# Patient Record
Sex: Female | Born: 1950 | Race: White | Hispanic: No | Marital: Married | Smoking: Former smoker
Health system: Southern US, Community
[De-identification: ages and names within clinical notes are randomized; demographics above are authoritative.]

## PROBLEM LIST (undated history)

## (undated) DIAGNOSIS — K635 Polyp of colon: Secondary | ICD-10-CM

## (undated) DIAGNOSIS — E669 Obesity, unspecified: Secondary | ICD-10-CM

## (undated) DIAGNOSIS — K219 Gastro-esophageal reflux disease without esophagitis: Secondary | ICD-10-CM

## (undated) DIAGNOSIS — M7989 Other specified soft tissue disorders: Secondary | ICD-10-CM

## (undated) DIAGNOSIS — E7800 Pure hypercholesterolemia, unspecified: Secondary | ICD-10-CM

## (undated) DIAGNOSIS — I10 Essential (primary) hypertension: Secondary | ICD-10-CM

## (undated) DIAGNOSIS — F329 Major depressive disorder, single episode, unspecified: Secondary | ICD-10-CM

## (undated) DIAGNOSIS — J449 Chronic obstructive pulmonary disease, unspecified: Secondary | ICD-10-CM

## (undated) DIAGNOSIS — F32A Depression, unspecified: Secondary | ICD-10-CM

## (undated) DIAGNOSIS — N200 Calculus of kidney: Secondary | ICD-10-CM

## (undated) DIAGNOSIS — R0602 Shortness of breath: Secondary | ICD-10-CM

## (undated) DIAGNOSIS — M2550 Pain in unspecified joint: Secondary | ICD-10-CM

## (undated) HISTORY — DX: Pain in unspecified joint: M25.50

## (undated) HISTORY — DX: Depression, unspecified: F32.A

## (undated) HISTORY — DX: Essential (primary) hypertension: I10

## (undated) HISTORY — DX: Pure hypercholesterolemia, unspecified: E78.00

## (undated) HISTORY — PX: TONSILLECTOMY: SUR1361

## (undated) HISTORY — DX: Calculus of kidney: N20.0

## (undated) HISTORY — DX: Gastro-esophageal reflux disease without esophagitis: K21.9

## (undated) HISTORY — DX: Chronic obstructive pulmonary disease, unspecified: J44.9

## (undated) HISTORY — DX: Other specified soft tissue disorders: M79.89

## (undated) HISTORY — DX: Polyp of colon: K63.5

## (undated) HISTORY — PX: DILATION AND CURETTAGE OF UTERUS: SHX78

## (undated) HISTORY — PX: MOHS SURGERY: SUR867

## (undated) HISTORY — PX: CHOLECYSTECTOMY: SHX55

## (undated) HISTORY — DX: Obesity, unspecified: E66.9

## (undated) HISTORY — DX: Shortness of breath: R06.02

## (undated) HISTORY — DX: Major depressive disorder, single episode, unspecified: F32.9

---

## 1998-02-04 ENCOUNTER — Emergency Department (HOSPITAL_COMMUNITY): Admission: EM | Admit: 1998-02-04 | Discharge: 1998-02-04 | Payer: Self-pay | Admitting: Emergency Medicine

## 1998-02-05 ENCOUNTER — Encounter: Payer: Self-pay | Admitting: Emergency Medicine

## 1998-03-19 ENCOUNTER — Encounter: Payer: Self-pay | Admitting: Internal Medicine

## 1998-03-19 ENCOUNTER — Ambulatory Visit (HOSPITAL_COMMUNITY): Admission: RE | Admit: 1998-03-19 | Discharge: 1998-03-19 | Payer: Self-pay | Admitting: Internal Medicine

## 1999-06-03 ENCOUNTER — Other Ambulatory Visit: Admission: RE | Admit: 1999-06-03 | Discharge: 1999-06-03 | Payer: Self-pay | Admitting: Internal Medicine

## 1999-06-05 ENCOUNTER — Emergency Department (HOSPITAL_COMMUNITY): Admission: EM | Admit: 1999-06-05 | Discharge: 1999-06-05 | Payer: Self-pay | Admitting: Emergency Medicine

## 1999-09-11 ENCOUNTER — Emergency Department (HOSPITAL_COMMUNITY): Admission: EM | Admit: 1999-09-11 | Discharge: 1999-09-11 | Payer: Self-pay | Admitting: Emergency Medicine

## 1999-10-28 ENCOUNTER — Ambulatory Visit (HOSPITAL_COMMUNITY): Admission: RE | Admit: 1999-10-28 | Discharge: 1999-10-28 | Payer: Self-pay | Admitting: Internal Medicine

## 2000-08-27 ENCOUNTER — Other Ambulatory Visit: Admission: RE | Admit: 2000-08-27 | Discharge: 2000-08-27 | Payer: Self-pay | Admitting: Internal Medicine

## 2001-11-01 ENCOUNTER — Other Ambulatory Visit: Admission: RE | Admit: 2001-11-01 | Discharge: 2001-11-01 | Payer: Self-pay | Admitting: General Surgery

## 2004-02-23 ENCOUNTER — Ambulatory Visit: Payer: Self-pay | Admitting: Internal Medicine

## 2004-02-28 ENCOUNTER — Ambulatory Visit: Payer: Self-pay | Admitting: Internal Medicine

## 2004-02-28 ENCOUNTER — Other Ambulatory Visit: Admission: RE | Admit: 2004-02-28 | Discharge: 2004-02-28 | Payer: Self-pay | Admitting: Internal Medicine

## 2004-03-24 HISTORY — PX: COLONOSCOPY: SHX174

## 2004-06-25 ENCOUNTER — Ambulatory Visit: Payer: Self-pay | Admitting: Internal Medicine

## 2004-08-08 ENCOUNTER — Ambulatory Visit: Payer: Self-pay | Admitting: Internal Medicine

## 2004-08-16 ENCOUNTER — Ambulatory Visit: Payer: Self-pay | Admitting: Internal Medicine

## 2004-08-16 ENCOUNTER — Encounter: Payer: Self-pay | Admitting: Internal Medicine

## 2004-08-16 ENCOUNTER — Encounter (INDEPENDENT_AMBULATORY_CARE_PROVIDER_SITE_OTHER): Payer: Self-pay | Admitting: Specialist

## 2004-08-16 DIAGNOSIS — K635 Polyp of colon: Secondary | ICD-10-CM

## 2004-08-16 HISTORY — DX: Polyp of colon: K63.5

## 2005-01-02 ENCOUNTER — Ambulatory Visit: Payer: Self-pay | Admitting: Internal Medicine

## 2005-04-07 ENCOUNTER — Emergency Department (HOSPITAL_COMMUNITY): Admission: EM | Admit: 2005-04-07 | Discharge: 2005-04-07 | Payer: Self-pay | Admitting: Emergency Medicine

## 2005-04-29 ENCOUNTER — Ambulatory Visit: Payer: Self-pay | Admitting: Internal Medicine

## 2005-05-06 ENCOUNTER — Encounter: Payer: Self-pay | Admitting: Internal Medicine

## 2005-05-06 ENCOUNTER — Other Ambulatory Visit: Admission: RE | Admit: 2005-05-06 | Discharge: 2005-05-06 | Payer: Self-pay | Admitting: Internal Medicine

## 2005-05-06 ENCOUNTER — Ambulatory Visit: Payer: Self-pay | Admitting: Internal Medicine

## 2005-08-11 ENCOUNTER — Ambulatory Visit: Payer: Self-pay | Admitting: Internal Medicine

## 2005-12-05 ENCOUNTER — Ambulatory Visit: Payer: Self-pay | Admitting: Cardiology

## 2005-12-05 ENCOUNTER — Ambulatory Visit: Payer: Self-pay | Admitting: Internal Medicine

## 2005-12-12 ENCOUNTER — Ambulatory Visit: Payer: Self-pay | Admitting: Gastroenterology

## 2005-12-18 ENCOUNTER — Ambulatory Visit: Payer: Self-pay | Admitting: Internal Medicine

## 2006-10-20 ENCOUNTER — Encounter: Payer: Self-pay | Admitting: Internal Medicine

## 2006-11-05 ENCOUNTER — Encounter (INDEPENDENT_AMBULATORY_CARE_PROVIDER_SITE_OTHER): Payer: Self-pay

## 2006-11-13 ENCOUNTER — Encounter (INDEPENDENT_AMBULATORY_CARE_PROVIDER_SITE_OTHER): Payer: Self-pay

## 2006-12-21 DIAGNOSIS — K219 Gastro-esophageal reflux disease without esophagitis: Secondary | ICD-10-CM | POA: Insufficient documentation

## 2006-12-21 DIAGNOSIS — F339 Major depressive disorder, recurrent, unspecified: Secondary | ICD-10-CM | POA: Insufficient documentation

## 2006-12-21 DIAGNOSIS — I10 Essential (primary) hypertension: Secondary | ICD-10-CM | POA: Insufficient documentation

## 2006-12-21 DIAGNOSIS — Z87442 Personal history of urinary calculi: Secondary | ICD-10-CM | POA: Insufficient documentation

## 2007-02-23 ENCOUNTER — Telehealth: Payer: Self-pay | Admitting: Internal Medicine

## 2007-06-16 ENCOUNTER — Ambulatory Visit: Payer: Self-pay | Admitting: Internal Medicine

## 2007-06-16 LAB — CONVERTED CEMR LAB
ALT: 16 units/L (ref 0–35)
AST: 18 units/L (ref 0–37)
Albumin: 3.5 g/dL (ref 3.5–5.2)
Alkaline Phosphatase: 93 units/L (ref 39–117)
BUN: 16 mg/dL (ref 6–23)
Basophils Absolute: 0.1 10*3/uL (ref 0.0–0.1)
Basophils Relative: 1.1 % — ABNORMAL HIGH (ref 0.0–1.0)
Bilirubin Urine: NEGATIVE
Bilirubin, Direct: 0.1 mg/dL (ref 0.0–0.3)
CO2: 34 meq/L — ABNORMAL HIGH (ref 19–32)
Calcium: 9.4 mg/dL (ref 8.4–10.5)
Chloride: 104 meq/L (ref 96–112)
Cholesterol: 220 mg/dL (ref 0–200)
Creatinine, Ser: 0.8 mg/dL (ref 0.4–1.2)
Direct LDL: 154.5 mg/dL
Eosinophils Absolute: 0.2 10*3/uL (ref 0.0–0.6)
Eosinophils Relative: 2.6 % (ref 0.0–5.0)
GFR calc Af Amer: 95 mL/min
GFR calc non Af Amer: 78 mL/min
Glucose, Bld: 114 mg/dL — ABNORMAL HIGH (ref 70–99)
Glucose, Urine, Semiquant: NEGATIVE
HCT: 38.5 % (ref 36.0–46.0)
HDL: 33.2 mg/dL — ABNORMAL LOW (ref >39.0)
Hemoglobin: 12.8 g/dL (ref 12.0–15.0)
Ketones, urine, test strip: NEGATIVE
Lymphocytes Relative: 24.5 % (ref 12.0–46.0)
MCHC: 33.3 g/dL (ref 30.0–36.0)
MCV: 88.4 fL (ref 78.0–100.0)
Monocytes Absolute: 0.6 10*3/uL (ref 0.2–0.7)
Monocytes Relative: 7.3 % (ref 3.0–11.0)
Neutro Abs: 5.3 10*3/uL (ref 1.4–7.7)
Neutrophils Relative %: 64.5 % (ref 43.0–77.0)
Nitrite: NEGATIVE
Platelets: 353 10*3/uL (ref 150–400)
Potassium: 3.5 meq/L (ref 3.5–5.1)
RBC: 4.36 M/uL (ref 3.87–5.11)
RDW: 13.6 % (ref 11.5–14.6)
Sodium: 142 meq/L (ref 135–145)
Specific Gravity, Urine: 1.02
TSH: 3.42 microintl units/mL (ref 0.35–5.50)
Total Bilirubin: 0.5 mg/dL (ref 0.3–1.2)
Total CHOL/HDL Ratio: 6.6
Total Protein: 6.5 g/dL (ref 6.0–8.3)
Triglycerides: 167 mg/dL — ABNORMAL HIGH (ref 0–149)
Urobilinogen, UA: 0.2
VLDL: 33 mg/dL (ref 0–40)
WBC Urine, dipstick: NEGATIVE
WBC: 8.2 10*3/uL (ref 4.5–10.5)
pH: 6.5

## 2007-06-22 ENCOUNTER — Encounter: Payer: Self-pay | Admitting: Internal Medicine

## 2007-06-23 ENCOUNTER — Ambulatory Visit: Payer: Self-pay | Admitting: Internal Medicine

## 2007-09-22 ENCOUNTER — Ambulatory Visit: Payer: Self-pay | Admitting: Internal Medicine

## 2008-01-24 ENCOUNTER — Ambulatory Visit: Payer: Self-pay | Admitting: Internal Medicine

## 2008-06-02 ENCOUNTER — Encounter: Payer: Self-pay | Admitting: Internal Medicine

## 2008-06-20 ENCOUNTER — Ambulatory Visit: Payer: Self-pay | Admitting: Internal Medicine

## 2008-06-20 LAB — CONVERTED CEMR LAB
ALT: 17 units/L (ref 0–35)
AST: 20 units/L (ref 0–37)
Albumin: 3.6 g/dL (ref 3.5–5.2)
Alkaline Phosphatase: 92 units/L (ref 39–117)
BUN: 15 mg/dL (ref 6–23)
Basophils Absolute: 0.1 10*3/uL (ref 0.0–0.1)
Basophils Relative: 1.2 % (ref 0.0–3.0)
Bilirubin Urine: NEGATIVE
Bilirubin, Direct: 0 mg/dL (ref 0.0–0.3)
Blood in Urine, dipstick: NEGATIVE
CO2: 32 meq/L (ref 19–32)
Calcium: 8.8 mg/dL (ref 8.4–10.5)
Chloride: 103 meq/L (ref 96–112)
Cholesterol: 211 mg/dL — ABNORMAL HIGH (ref 0–200)
Creatinine, Ser: 0.7 mg/dL (ref 0.4–1.2)
Direct LDL: 150.5 mg/dL
Eosinophils Absolute: 0.2 10*3/uL (ref 0.0–0.7)
Eosinophils Relative: 2.5 % (ref 0.0–5.0)
GFR calc non Af Amer: 91.01 mL/min (ref >60)
Glucose, Bld: 112 mg/dL — ABNORMAL HIGH (ref 70–99)
Glucose, Urine, Semiquant: NEGATIVE
HCT: 37.9 % (ref 36.0–46.0)
HDL: 33.5 mg/dL — ABNORMAL LOW (ref >39.00)
Hemoglobin: 12.8 g/dL (ref 12.0–15.0)
Lymphocytes Relative: 21.9 % (ref 12.0–46.0)
Lymphs Abs: 1.9 10*3/uL (ref 0.7–4.0)
MCHC: 33.8 g/dL (ref 30.0–36.0)
MCV: 87.7 fL (ref 78.0–100.0)
Monocytes Absolute: 0.5 10*3/uL (ref 0.1–1.0)
Monocytes Relative: 6.3 % (ref 3.0–12.0)
Neutro Abs: 6 10*3/uL (ref 1.4–7.7)
Neutrophils Relative %: 68.1 % (ref 43.0–77.0)
Nitrite: NEGATIVE
Platelets: 329 10*3/uL (ref 150.0–400.0)
Potassium: 3.3 meq/L — ABNORMAL LOW (ref 3.5–5.1)
RBC: 4.33 M/uL (ref 3.87–5.11)
RDW: 13.8 % (ref 11.5–14.6)
Sodium: 143 meq/L (ref 135–145)
Specific Gravity, Urine: 1.02
TSH: 4.71 microintl units/mL (ref 0.35–5.50)
Total Bilirubin: 0.4 mg/dL (ref 0.3–1.2)
Total CHOL/HDL Ratio: 6
Total Protein: 7.2 g/dL (ref 6.0–8.3)
Triglycerides: 122 mg/dL (ref 0.0–149.0)
Urobilinogen, UA: 0.2
VLDL: 24.4 mg/dL (ref 0.0–40.0)
WBC Urine, dipstick: NEGATIVE
WBC: 8.7 10*3/uL (ref 4.5–10.5)
pH: 6

## 2008-06-27 ENCOUNTER — Other Ambulatory Visit: Admission: RE | Admit: 2008-06-27 | Discharge: 2008-06-27 | Payer: Self-pay | Admitting: Internal Medicine

## 2008-06-27 ENCOUNTER — Ambulatory Visit: Payer: Self-pay | Admitting: Internal Medicine

## 2008-06-27 ENCOUNTER — Encounter: Payer: Self-pay | Admitting: Internal Medicine

## 2008-08-24 ENCOUNTER — Encounter: Payer: Self-pay | Admitting: Internal Medicine

## 2008-11-28 ENCOUNTER — Telehealth: Payer: Self-pay | Admitting: Internal Medicine

## 2008-12-06 ENCOUNTER — Ambulatory Visit: Payer: Self-pay | Admitting: Internal Medicine

## 2008-12-21 ENCOUNTER — Encounter (INDEPENDENT_AMBULATORY_CARE_PROVIDER_SITE_OTHER): Payer: Self-pay | Admitting: Obstetrics and Gynecology

## 2008-12-21 ENCOUNTER — Ambulatory Visit (HOSPITAL_BASED_OUTPATIENT_CLINIC_OR_DEPARTMENT_OTHER): Admission: RE | Admit: 2008-12-21 | Discharge: 2008-12-21 | Payer: Self-pay | Admitting: Obstetrics and Gynecology

## 2009-01-12 ENCOUNTER — Ambulatory Visit: Payer: Self-pay | Admitting: Internal Medicine

## 2009-01-19 ENCOUNTER — Telehealth: Payer: Self-pay | Admitting: Internal Medicine

## 2009-03-30 ENCOUNTER — Encounter: Payer: Self-pay | Admitting: Internal Medicine

## 2009-09-14 ENCOUNTER — Encounter: Payer: Self-pay | Admitting: Internal Medicine

## 2010-04-11 ENCOUNTER — Ambulatory Visit
Admission: RE | Admit: 2010-04-11 | Discharge: 2010-04-11 | Payer: Self-pay | Source: Home / Self Care | Attending: Internal Medicine | Admitting: Internal Medicine

## 2010-04-23 NOTE — Consult Note (Signed)
Summary: [facility]  [facility]   Imported By: Maryln Gottron 04/04/2009 13:28:43  _____________________________________________________________________  External Attachment:    Type:   Image     Comment:   External Document

## 2010-04-25 NOTE — Assessment & Plan Note (Signed)
Summary: med check and refill appt/cjr   Vital Signs:  Patient profile:   60 year old Dominguez Weight:      302 pounds Temp:     98.6 degrees F oral BP sitting:   128 / 78  (right arm) Cuff size:   large  Vitals Entered By: Duard Brady LPN (April 11, 2010 4:26 PM) CC: medication review with refills  Is Patient Diabetic? No   CC:  medication review with refills .  History of Present Illness: 60 year old patient who is seen today for follow-up.  She has treated hypertension Gastrosoft reflux disease and a history of depression.  She has nephrolithiasis.  That has been stable.  She has been compliant with her medications although has not been seen here in some time.  Allergies: 1)  ! Codeine Phosphate (Codeine Phosphate)  Past History:  Past Medical History: Reviewed history from 12/21/2006 and no changes required. Hypertension Nephrolithiasis, hx of Obesity GERD Depression  Past Surgical History: Reviewed history from 06/23/2007 and no changes required. Colonoscopy  May 2006 Tonsillectomy Cholecystectomy 1998   Review of Systems       The patient complains of depression.  The patient denies anorexia, fever, weight loss, weight gain, vision loss, decreased hearing, hoarseness, chest pain, syncope, dyspnea on exertion, peripheral edema, prolonged cough, headaches, hemoptysis, abdominal pain, melena, hematochezia, severe indigestion/heartburn, hematuria, incontinence, genital sores, muscle weakness, suspicious skin lesions, transient blindness, difficulty walking, unusual weight change, abnormal bleeding, enlarged lymph nodes, angioedema, and breast masses.    Physical Exam  General:  overweight-appearing.  126/76 Head:  Normocephalic and atraumatic without obvious abnormalities. No apparent alopecia or balding. Eyes:  No corneal or conjunctival inflammation noted. EOMI. Perrla. Funduscopic exam benign, without hemorrhages, exudates or papilledema. Vision grossly  normal. Ears:  External ear exam shows no significant lesions or deformities.  Otoscopic examination reveals clear canals, tympanic membranes are intact bilaterally without bulging, retraction, inflammation or discharge. Hearing is grossly normal bilaterally. Mouth:  Oral mucosa and oropharynx without lesions or exudates.  Teeth in good repair. Neck:  No deformities, masses, or tenderness noted. Lungs:  Normal respiratory effort, chest expands symmetrically. Lungs are clear to auscultation, no crackles or wheezes. Heart:  Normal rate and regular rhythm. S1 and S2 normal without gallop, murmur, click, rub or other extra sounds. Abdomen:  Bowel sounds positive,abdomen soft and non-tender without masses, organomegaly or hernias noted. Msk:  No deformity or scoliosis noted of thoracic or lumbar spine.   Pulses:  R and L carotid,radial,femoral,dorsalis pedis and posterior tibial pulses are full and equal bilaterally Extremities:  1+ left pedal edema and 1+ right pedal edema.     Impression & Recommendations:  Problem # 1:  DEPRESSION (ICD-311)  Her updated medication list for this problem includes:    Diazepam 2 Mg Tabs (Diazepam) .Marland Kitchen... 1 at bedtime as needed    Paxil 40 Mg Tabs (Paroxetine hcl) .Marland Kitchen... 1 once daily  Problem # 2:  HYPERTENSION (ICD-401.9)  Her updated medication list for this problem includes:    Hyzaar 100-25 Mg Tabs (Losartan potassium-hctz) .Marland Kitchen... 1 once daily    Norvasc 5 Mg Tabs (Amlodipine besylate) .Marland Kitchen... 1 once daily    Furosemide 40 Mg Tabs (Furosemide) .Marland Kitchen... 1 as needed  Complete Medication List: 1)  Tramadol Hcl 50 Mg Tabs (Tramadol hcl) .Marland Kitchen.. 1 q6h as needed 2)  Hyzaar 100-25 Mg Tabs (Losartan potassium-hctz) .Marland Kitchen.. 1 once daily 3)  Norvasc 5 Mg Tabs (Amlodipine besylate) .Marland Kitchen.. 1 once daily  4)  Omeprazole 20 Mg Cpdr (Omeprazole) .Marland Kitchen.. 1 once daily 5)  Furosemide 40 Mg Tabs (Furosemide) .Marland Kitchen.. 1 as needed 6)  Meclizine Hcl 25 Mg Tabs (Meclizine hcl) .Marland Kitchen.. 1 as needed 7)   Diazepam 2 Mg Tabs (Diazepam) .Marland Kitchen.. 1 at bedtime as needed 8)  Paxil 40 Mg Tabs (Paroxetine hcl) .Marland Kitchen.. 1 once daily 9)  Naproxen Dr 500 Mg Tbec (Naproxen) .Marland Kitchen.. 1 two times a day prn  Patient Instructions: 1)  Please schedule a follow-up appointment in 6 months for CPX  2)  Limit your Sodium (Salt). 3)  It is important that you exercise regularly at least 20 minutes 5 times a week. If you develop chest pain, have severe difficulty breathing, or feel very tired , stop exercising immediately and seek medical attention. 4)  You need to lose weight. Consider a lower calorie diet and regular exercise.  Prescriptions: DIAZEPAM 2 MG  TABS (DIAZEPAM) 1 at bedtime as needed  #50 x 3   Entered and Authorized by:   Gordy Savers  MD   Signed by:   Gordy Savers  MD on 04/11/2010   Method used:   Print then Give to Patient   RxID:   [id number] PAXIL 40 MG  TABS (PAROXETINE HCL) 1 once daily  #90 Each x 4   Entered and Authorized by:   Gordy Savers  MD   Signed by:   Gordy Savers  MD on 04/11/2010   Method used:   Electronically to        [facility]* (retail)       [address] [place], [place]  [address]       Ph: [phone]       Fax: [phone]   RxID:   [id number] MECLIZINE HCL 25 MG  TABS (MECLIZINE HCL) 1 as needed  #50 x 6   Entered and Authorized by:   Gordy Savers  MD   Signed by:   Gordy Savers  MD on 04/11/2010   Method used:   Electronically to        [facility]* (retail)       [address] [place], [place]  [address]       Ph: [phone]       Fax: [phone]   RxID:   [id number] FUROSEMIDE 40 MG  TABS (FUROSEMIDE) 1 as needed  #90 x 6   Entered and Authorized by:   Gordy Savers  MD   Signed by:   Gordy Savers  MD on 04/11/2010   Method used:   Electronically to        [facility]* (retail)       [address] [place], [place]  [address]       Ph: [phone]       Fax: [phone]   RxID:   [id number] OMEPRAZOLE 20 MG  CPDR (OMEPRAZOLE) 1 once daily  #90 Each x 4   Entered and Authorized by:   Gordy Savers  MD   Signed by:   Gordy Savers  MD on 04/11/2010   Method used:   Electronically to        [facility]* (retail)  [address]       [place], [place]  [address]       Ph: [phone]       Fax: [phone]   RxID:   [id number] NORVASC 5 MG  TABS (AMLODIPINE BESYLATE) 1 once daily  #90 Each x 4   Entered and Authorized by:   Gordy Savers  MD   Signed by:   Gordy Savers  MD on 04/11/2010   Method used:   Electronically to        [facility]* (retail)       [address] [place], [place]  [address]       Ph: [phone]       Fax: [phone]   RxID:   [id number] HYZAAR 100-25 MG  TABS (LOSARTAN POTASSIUM-HCTZ) 1 once daily  #90 Each x 4   Entered and Authorized by:   Gordy Savers  MD   Signed by:   Gordy Savers  MD on 04/11/2010   Method used:   Electronically to        [facility]* (retail)       [address] [place], [place]  [address]       Ph: [phone]       Fax: [phone]   RxID:   [id number] TRAMADOL HCL 50 MG  TABS (TRAMADOL HCL) 1 q6h as needed  #90 Each x 4   Entered and Authorized by:   Gordy Savers  MD   Signed by:   Gordy Savers  MD on 04/11/2010   Method used:   Electronically to        [facility]* (retail)       [address] [place], [place]  [address]       Ph: [phone]       Fax: [phone]   RxID:   [id number]    Orders Added: 1)  Est. Patient Level III [101[address]]

## 2010-06-28 LAB — POCT I-STAT 4, (NA,K, GLUC, HGB,HCT)
Glucose, Bld: 131 mg/dL — ABNORMAL HIGH (ref 70–99)
HCT: 41 % (ref 36.0–46.0)
Sodium: 141 mEq/L (ref 135–145)

## 2010-10-02 ENCOUNTER — Other Ambulatory Visit (INDEPENDENT_AMBULATORY_CARE_PROVIDER_SITE_OTHER): Payer: PRIVATE HEALTH INSURANCE

## 2010-10-02 DIAGNOSIS — Z0000 Encounter for general adult medical examination without abnormal findings: Secondary | ICD-10-CM

## 2010-10-02 LAB — CBC WITH DIFFERENTIAL/PLATELET
Basophils Relative: 0.7 % (ref 0.0–3.0)
Eosinophils Relative: 2.4 % (ref 0.0–5.0)
HCT: 38.8 % (ref 36.0–46.0)
Lymphs Abs: 1.8 10*3/uL (ref 0.7–4.0)
MCV: 89.5 fl (ref 78.0–100.0)
Monocytes Absolute: 0.5 10*3/uL (ref 0.1–1.0)
RBC: 4.34 Mil/uL (ref 3.87–5.11)
WBC: 7.8 10*3/uL (ref 4.5–10.5)

## 2010-10-02 LAB — LIPID PANEL
LDL Cholesterol: 132 mg/dL — ABNORMAL HIGH (ref 0–99)
Total CHOL/HDL Ratio: 5
Triglycerides: 116 mg/dL (ref 0.0–149.0)

## 2010-10-02 LAB — POCT URINALYSIS DIPSTICK
Glucose, UA: NEGATIVE
Spec Grav, UA: 1.025
Urobilinogen, UA: 0.2

## 2010-10-02 LAB — BASIC METABOLIC PANEL
Chloride: 103 mEq/L (ref 96–112)
Potassium: 3.7 mEq/L (ref 3.5–5.1)

## 2010-10-02 LAB — HEPATIC FUNCTION PANEL
Albumin: 4.1 g/dL (ref 3.5–5.2)
Alkaline Phosphatase: 96 U/L (ref 39–117)
Bilirubin, Direct: 0.1 mg/dL (ref 0.0–0.3)

## 2010-10-03 ENCOUNTER — Encounter: Payer: Self-pay | Admitting: Internal Medicine

## 2010-10-09 ENCOUNTER — Encounter: Payer: Self-pay | Admitting: Internal Medicine

## 2010-10-09 ENCOUNTER — Ambulatory Visit (INDEPENDENT_AMBULATORY_CARE_PROVIDER_SITE_OTHER): Payer: PRIVATE HEALTH INSURANCE | Admitting: Internal Medicine

## 2010-10-09 DIAGNOSIS — F3289 Other specified depressive episodes: Secondary | ICD-10-CM

## 2010-10-09 DIAGNOSIS — K219 Gastro-esophageal reflux disease without esophagitis: Secondary | ICD-10-CM

## 2010-10-09 DIAGNOSIS — Z0000 Encounter for general adult medical examination without abnormal findings: Secondary | ICD-10-CM

## 2010-10-09 DIAGNOSIS — F329 Major depressive disorder, single episode, unspecified: Secondary | ICD-10-CM

## 2010-10-09 DIAGNOSIS — Z23 Encounter for immunization: Secondary | ICD-10-CM

## 2010-10-09 DIAGNOSIS — I10 Essential (primary) hypertension: Secondary | ICD-10-CM

## 2010-10-09 MED ORDER — AMLODIPINE BESYLATE 5 MG PO TABS: 5.0000 mg | ORAL_TABLET | Freq: Every day | ORAL | Status: DC

## 2010-10-09 MED ORDER — OMEPRAZOLE 20 MG PO CPDR: 20.0000 mg | DELAYED_RELEASE_CAPSULE | Freq: Every day | ORAL | Status: DC

## 2010-10-09 MED ORDER — FUROSEMIDE 40 MG PO TABS: 40.0000 mg | ORAL_TABLET | ORAL | Status: DC | PRN

## 2010-10-09 MED ORDER — DIAZEPAM 2 MG PO TABS: 2.0000 mg | ORAL_TABLET | Freq: Every evening | ORAL | Status: DC | PRN

## 2010-10-09 MED ORDER — TRAMADOL HCL 50 MG PO TABS: 50.0000 mg | ORAL_TABLET | Freq: Four times a day (QID) | ORAL | Status: DC | PRN

## 2010-10-09 MED ORDER — LOSARTAN POTASSIUM-HCTZ 100-25 MG PO TABS: 1.0000 | ORAL_TABLET | Freq: Every day | ORAL | Status: DC

## 2010-10-09 MED ORDER — EFLORNITHINE HCL 13.9 % EX CREA: TOPICAL_CREAM | CUTANEOUS | Status: DC

## 2010-10-09 MED ORDER — PAROXETINE HCL 40 MG PO TABS: 40.0000 mg | ORAL_TABLET | ORAL | Status: DC

## 2010-10-09 NOTE — Patient Instructions (Signed)
Limit your sodium (Salt) intake    It is important that you exercise regularly, at least 20 minutes 3 to 4 times per week.  If you develop chest pain or shortness of breath seek  medical attention.  She You need to lose weight.  Consider a lower calorie diet and regular exercise.

## 2010-10-09 NOTE — Progress Notes (Signed)
  Subjective:    Patient ID: Laurie Dominguez, female    DOB: Oct 14, 1950, 60 y.o.   MRN: [id number]  HPI  60 year old patient who is in today for a wellness exam. Medical problems include a history of treated hypertension she has a history of depression gastroesophageal reflux disease. She has done quite well without concerns or complaints. She has some musculoskeletal pain and spasm of the left leg.    Review of Systems  Constitutional: Negative for fever, appetite change, fatigue and unexpected weight change.  HENT: Negative for hearing loss, ear pain, nosebleeds, congestion, sore throat, mouth sores, trouble swallowing, neck stiffness, dental problem, voice change, sinus pressure and tinnitus.   Eyes: Negative for photophobia, pain, redness and visual disturbance.  Respiratory: Negative for cough, chest tightness and shortness of breath.   Cardiovascular: Negative for chest pain, palpitations and leg swelling.  Gastrointestinal: Negative for nausea, vomiting, abdominal pain, diarrhea, constipation, blood in stool, abdominal distention and rectal pain.  Genitourinary: Negative for dysuria, urgency, frequency, hematuria, flank pain, vaginal bleeding, vaginal discharge, difficulty urinating, genital sores, vaginal pain, menstrual problem and pelvic pain.  Musculoskeletal: Negative for back pain and arthralgias.  Skin: Negative for rash.  Neurological: Negative for dizziness, syncope, speech difficulty, weakness, light-headedness, numbness and headaches.  Hematological: Negative for adenopathy. Does not bruise/bleed easily.  Psychiatric/Behavioral: Negative for suicidal ideas, behavioral problems, self-injury, dysphoric mood and agitation. The patient is not nervous/anxious.        Objective:   Physical Exam  Constitutional: She is oriented to person, place, and time. She appears well-developed and well-nourished.       Obese. Normal blood pressure  HENT:  Head: Normocephalic.  Right Ear:  External ear normal.  Left Ear: External ear normal.  Mouth/Throat: Oropharynx is clear and moist.  Eyes: Conjunctivae and EOM are normal. Pupils are equal, round, and reactive to light.  Neck: Normal range of motion. Neck supple. No thyromegaly present.  Cardiovascular: Normal rate, regular rhythm, normal heart sounds and intact distal pulses.   Pulmonary/Chest: Effort normal and breath sounds normal.  Abdominal: Soft. Bowel sounds are normal. She exhibits no mass. There is no tenderness.  Musculoskeletal: Normal range of motion.  Lymphadenopathy:    She has no cervical adenopathy.  Neurological: She is alert and oriented to person, place, and time.  Skin: Skin is warm and dry. No rash noted.  Psychiatric: She has a normal mood and affect. Her behavior is normal.          Assessment & Plan:  Health maintenance exam Morbid obesity Hypertension well controlled History of nephrolithiasis Gastroesophageal reflux disease   Weight loss encouraged as well as restricted salt diet exercise regimen encouraged return in 6 months for followup

## 2011-04-10 ENCOUNTER — Ambulatory Visit: Payer: PRIVATE HEALTH INSURANCE | Admitting: Internal Medicine

## 2011-05-23 ENCOUNTER — Other Ambulatory Visit: Payer: Self-pay | Admitting: Internal Medicine

## 2011-05-30 ENCOUNTER — Other Ambulatory Visit: Payer: Self-pay | Admitting: Dermatology

## 2011-08-27 ENCOUNTER — Telehealth: Payer: Self-pay | Admitting: *Deleted

## 2011-08-27 NOTE — Telephone Encounter (Signed)
Last seen 09/2010 cpx Please advise on both meds

## 2011-08-27 NOTE — Telephone Encounter (Signed)
Requesting dosage change of paxil from 40 to 60 and requesting refill on diazepam -asking for about #10--contact at [phone]

## 2011-08-28 MED ORDER — DIAZEPAM 2 MG PO TABS: 2.0000 mg | ORAL_TABLET | Freq: Every evening | ORAL | Status: DC | PRN

## 2011-08-28 MED ORDER — PAROXETINE HCL 30 MG PO TABS: ORAL_TABLET | ORAL | Status: DC

## 2011-08-28 NOTE — Telephone Encounter (Signed)
ok 

## 2011-08-28 NOTE — Telephone Encounter (Signed)
Done - called in Pt aware

## 2011-10-20 ENCOUNTER — Other Ambulatory Visit: Payer: Self-pay | Admitting: Internal Medicine

## 2011-11-06 ENCOUNTER — Other Ambulatory Visit: Payer: Self-pay | Admitting: Internal Medicine

## 2011-11-27 ENCOUNTER — Ambulatory Visit: Payer: PRIVATE HEALTH INSURANCE | Admitting: Internal Medicine

## 2011-11-28 ENCOUNTER — Ambulatory Visit (INDEPENDENT_AMBULATORY_CARE_PROVIDER_SITE_OTHER): Payer: PRIVATE HEALTH INSURANCE | Admitting: Internal Medicine

## 2011-11-28 ENCOUNTER — Encounter: Payer: Self-pay | Admitting: Internal Medicine

## 2011-11-28 VITALS — BP 150/88 | Wt 300.0 lb

## 2011-11-28 DIAGNOSIS — K219 Gastro-esophageal reflux disease without esophagitis: Secondary | ICD-10-CM

## 2011-11-28 DIAGNOSIS — I10 Essential (primary) hypertension: Secondary | ICD-10-CM

## 2011-11-28 DIAGNOSIS — F329 Major depressive disorder, single episode, unspecified: Secondary | ICD-10-CM

## 2011-11-28 DIAGNOSIS — F3289 Other specified depressive episodes: Secondary | ICD-10-CM

## 2011-11-28 MED ORDER — NAPROXEN 500 MG PO TABS: 500.0000 mg | ORAL_TABLET | Freq: Two times a day (BID) | ORAL | Status: DC

## 2011-11-28 MED ORDER — DIAZEPAM 2 MG PO TABS: 2.0000 mg | ORAL_TABLET | Freq: Every evening | ORAL | Status: DC | PRN

## 2011-11-28 MED ORDER — PAROXETINE HCL 30 MG PO TABS: ORAL_TABLET | ORAL | Status: DC

## 2011-11-28 MED ORDER — MECLIZINE HCL 25 MG PO TABS: 25.0000 mg | ORAL_TABLET | ORAL | Status: DC | PRN

## 2011-11-28 MED ORDER — OMEPRAZOLE 20 MG PO CPDR: 20.0000 mg | DELAYED_RELEASE_CAPSULE | Freq: Every day | ORAL | Status: DC

## 2011-11-28 MED ORDER — LOSARTAN POTASSIUM-HCTZ 100-25 MG PO TABS: 1.0000 | ORAL_TABLET | Freq: Every day | ORAL | Status: DC

## 2011-11-28 MED ORDER — AMLODIPINE BESYLATE 5 MG PO TABS: 5.0000 mg | ORAL_TABLET | Freq: Every day | ORAL | Status: DC

## 2011-11-28 MED ORDER — FUROSEMIDE 40 MG PO TABS: 40.0000 mg | ORAL_TABLET | ORAL | Status: DC | PRN

## 2011-11-28 MED ORDER — TRAMADOL HCL 50 MG PO TABS: 50.0000 mg | ORAL_TABLET | Freq: Four times a day (QID) | ORAL | Status: DC | PRN

## 2011-11-28 NOTE — Patient Instructions (Addendum)
Limit your sodium (Salt) intake    It is important that you exercise regularly, at least 20 minutes 3 to 4 times per week.  If you develop chest pain or shortness of breath seek  medical attention.  You need to lose weight.  Consider a lower calorie diet and regular exercise. 

## 2011-11-28 NOTE — Progress Notes (Signed)
Subjective:    Patient ID: Laurie Dominguez, female    DOB: 09-18-1950, 61 y.o.   MRN: [id number]  HPI  61 year old patient who is seen today for followup of hypertension. She has a history depression she has gastro-a Salter reflux disease which has been stable. No new concerns or complaints. Seen today basically for a medication refill. Her last visit here was over one year ago  Past Medical History  Diagnosis Date  . Hypertension   . GERD (gastroesophageal reflux disease)   . Depression   . Obesity   . Nephrolithiasis     History   Social History  . Marital Status: Married    Spouse Name: N/A    Number of Children: N/A  . Years of Education: N/A   Occupational History  . Not on file.   Social History Main Topics  . Smoking status: Former Smoker    Quit date: 03/24/2006  . Smokeless tobacco: Never Used  . Alcohol Use: Yes  . Drug Use: Not on file  . Sexually Active: Not on file   Other Topics Concern  . Not on file   Social History Narrative  . No narrative on file    Past Surgical History  Procedure Date  . Cholecystectomy   . Tonsillectomy     Family History  Problem Relation Age of Onset  . Cancer Mother   . Other Father     spinal cord tumor    Allergies  Allergen Reactions  . Codeine Phosphate     Current Outpatient Prescriptions on File Prior to Visit  Medication Sig Dispense Refill  . amLODipine (NORVASC) 5 MG tablet TAKE ONE TABLET BY MOUTH EVERY DAY  90 tablet  2  . diazepam (VALIUM) 2 MG tablet Take 1 tablet (2 mg total) by mouth at bedtime as needed.  30 tablet  1  . Eflornithine HCl 13.9 % cream Applied twice daily  30 g  4  . furosemide (LASIX) 40 MG tablet Take 1 tablet (40 mg total) by mouth as needed.  90 tablet  6  . losartan-hydrochlorothiazide (HYZAAR) 100-25 MG per tablet TAKE ONE TABLET BY MOUTH EVERY DAY  30 tablet  0  . meclizine (ANTIVERT) 25 MG tablet Take 25 mg by mouth as needed.        . naproxen (NAPROSYN) 500 MG tablet  Take 500 mg by mouth 2 (two) times daily with a meal.        . omeprazole (PRILOSEC) 20 MG capsule TAKE ONE CAPSULE BY MOUTH EVERY DAY  90 capsule  0  . PARoxetine (PAXIL) 30 MG tablet 2tabs every morning  180 tablet  0  . traMADol (ULTRAM) 50 MG tablet TAKE ONE TABLET BY MOUTH EVERY 6 HOURS AS NEEDED  30 tablet  0    BP 150/88  Wt 300 lb (136.079 kg)       Review of Systems  Constitutional: Negative.   HENT: Negative for hearing loss, congestion, sore throat, rhinorrhea, dental problem, sinus pressure and tinnitus.   Eyes: Negative for pain, discharge and visual disturbance.  Respiratory: Negative for cough and shortness of breath.   Cardiovascular: Negative for chest pain, palpitations and leg swelling.  Gastrointestinal: Negative for nausea, vomiting, abdominal pain, diarrhea, constipation, blood in stool and abdominal distention.  Genitourinary: Negative for dysuria, urgency, frequency, hematuria, flank pain, vaginal bleeding, vaginal discharge, difficulty urinating, vaginal pain and pelvic pain.  Musculoskeletal: Negative for joint swelling, arthralgias and gait problem.  Skin: Negative for rash.  Neurological: Negative for dizziness, syncope, speech difficulty, weakness, numbness and headaches.  Hematological: Negative for adenopathy.  Psychiatric/Behavioral: Negative for behavioral problems, dysphoric mood and agitation. The patient is not nervous/anxious.        Objective:   Physical Exam  Constitutional: She is oriented to person, place, and time. She appears well-developed and well-nourished.       Repeat blood pressure 110/64  HENT:  Head: Normocephalic.  Right Ear: External ear normal.  Left Ear: External ear normal.  Mouth/Throat: Oropharynx is clear and moist.  Eyes: Conjunctivae and EOM are normal. Pupils are equal, round, and reactive to light.  Neck: Normal range of motion. Neck supple. No thyromegaly present.  Cardiovascular: Normal rate, regular rhythm,  normal heart sounds and intact distal pulses.   Pulmonary/Chest: Effort normal and breath sounds normal.  Abdominal: Soft. Bowel sounds are normal. She exhibits no mass. There is no tenderness.  Musculoskeletal: Normal range of motion.  Lymphadenopathy:    She has no cervical adenopathy.  Neurological: She is alert and oriented to person, place, and time.  Skin: Skin is warm and dry. No rash noted.  Psychiatric: She has a normal mood and affect. Her behavior is normal.          Assessment & Plan:   Hypertension stable History depression  Medications updated CPX 6 months

## 2012-06-03 ENCOUNTER — Encounter: Payer: PRIVATE HEALTH INSURANCE | Admitting: Internal Medicine

## 2012-06-03 DIAGNOSIS — Z0289 Encounter for other administrative examinations: Secondary | ICD-10-CM

## 2012-06-05 ENCOUNTER — Encounter: Payer: Self-pay | Admitting: Family Medicine

## 2012-06-05 ENCOUNTER — Ambulatory Visit (INDEPENDENT_AMBULATORY_CARE_PROVIDER_SITE_OTHER): Payer: No Typology Code available for payment source | Admitting: Internal Medicine

## 2012-06-05 VITALS — BP 130/60 | HR 103 | Temp 98.2°F

## 2012-06-05 DIAGNOSIS — I10 Essential (primary) hypertension: Secondary | ICD-10-CM

## 2012-06-05 MED ORDER — HYDROCODONE-HOMATROPINE 5-1.5 MG/5ML PO SYRP: 5.0000 mL | ORAL_SOLUTION | Freq: Four times a day (QID) | ORAL | Status: AC | PRN

## 2012-06-05 MED ORDER — AZITHROMYCIN 250 MG PO TABS: ORAL_TABLET | ORAL | Status: DC

## 2012-06-05 NOTE — Patient Instructions (Signed)
Use saline irrigation, warm  moist compresses and over-the-counter decongestants only as directed.  Call if there is no improvement in 5 to 7 days, or sooner if you develop increasing pain, fever, or any new symptoms. 

## 2012-06-05 NOTE — Progress Notes (Signed)
Subjective:    Patient ID: Laurie Dominguez, female    DOB: [birth year], 62 y.o.   MRN: [id number]  HPI 62 y/o h/o HTN with 5 day h/o worsening sinus congestion assoc with chest congestion;  Has fever, sinus pain and drainage; slept poorly last night H/o codeine sensitivity (nausea) but no issues with hydrocodone  Past Medical History  Diagnosis Date  . Hypertension   . GERD (gastroesophageal reflux disease)   . Depression   . Obesity   . Nephrolithiasis     History   Social History  . Marital Status: Married    Spouse Name: N/A    Number of Children: N/A  . Years of Education: N/A   Occupational History  . Not on file.   Social History Main Topics  . Smoking status: Former Smoker    Quit date: 03/24/2006  . Smokeless tobacco: Never Used  . Alcohol Use: Yes  . Drug Use: Not on file  . Sexually Active: Not on file   Other Topics Concern  . Not on file   Social History Narrative  . No narrative on file    Past Surgical History  Procedure Laterality Date  . Cholecystectomy    . Tonsillectomy      Family History  Problem Relation Age of Onset  . Cancer Mother   . Other Father     spinal cord tumor    Allergies  Allergen Reactions  . Codeine Phosphate     Current Outpatient Prescriptions on File Prior to Visit  Medication Sig Dispense Refill  . amLODipine (NORVASC) 5 MG tablet Take 1 tablet (5 mg total) by mouth daily.  90 tablet  4  . diazepam (VALIUM) 2 MG tablet Take 1 tablet (2 mg total) by mouth at bedtime as needed.  30 tablet  1  . furosemide (LASIX) 40 MG tablet Take 1 tablet (40 mg total) by mouth as needed.  90 tablet  6  . losartan-hydrochlorothiazide (HYZAAR) 100-25 MG per tablet Take 1 tablet by mouth daily.  90 tablet  3  . meclizine (ANTIVERT) 25 MG tablet Take 1 tablet (25 mg total) by mouth as needed.  60 tablet  4  . naproxen (NAPROSYN) 500 MG tablet Take 1 tablet (500 mg total) by mouth 2 (two) times daily with a meal.  60 tablet  4  .  omeprazole (PRILOSEC) 20 MG capsule Take 1 capsule (20 mg total) by mouth daily.  90 capsule  4  . PARoxetine (PAXIL) 30 MG tablet 2tabs every morning  180 tablet  4  . traMADol (ULTRAM) 50 MG tablet Take 1 tablet (50 mg total) by mouth every 6 (six) hours as needed for pain.  90 tablet  4   No current facility-administered medications on file prior to visit.    BP 130/60  Pulse 103  Temp(Src) 98.2 F (36.8 C) (Oral)  SpO2 92%       Review of Systems  Constitutional: Positive for fever, activity change, appetite change and fatigue.  HENT: Positive for congestion, rhinorrhea and sinus pressure. Negative for hearing loss, sore throat, dental problem and tinnitus.   Eyes: Negative for pain, discharge and visual disturbance.  Respiratory: Positive for cough. Negative for shortness of breath.   Cardiovascular: Negative for chest pain, palpitations and leg swelling.  Gastrointestinal: Negative for nausea, vomiting, abdominal pain, diarrhea, constipation, blood in stool and abdominal distention.  Genitourinary: Negative for dysuria, urgency, frequency, hematuria, flank pain, vaginal bleeding, vaginal discharge, difficulty urinating, vaginal pain  and pelvic pain.  Musculoskeletal: Negative for joint swelling, arthralgias and gait problem.  Skin: Negative for rash.  Neurological: Positive for headaches. Negative for dizziness, syncope, speech difficulty, weakness and numbness.  Hematological: Negative for adenopathy.  Psychiatric/Behavioral: Negative for behavioral problems, dysphoric mood and agitation. The patient is not nervous/anxious.        Objective:   Physical Exam  Constitutional: She is oriented to person, place, and time. She appears well-developed and well-nourished.  HENT:  Head: Normocephalic.  Right Ear: External ear normal.  Left Ear: External ear normal.  Mouth/Throat: Oropharynx is clear and moist.  Max sinus tenderness  Eyes: Conjunctivae and EOM are normal.  Pupils are equal, round, and reactive to light.  Neck: Normal range of motion. Neck supple. No thyromegaly present.  Cardiovascular: Normal rate, regular rhythm, normal heart sounds and intact distal pulses.   Pulmonary/Chest: Effort normal and breath sounds normal.  Abdominal: Soft. Bowel sounds are normal. She exhibits no mass. There is no tenderness.  Musculoskeletal: Normal range of motion.  Lymphadenopathy:    She has no cervical adenopathy.  Neurological: She is alert and oriented to person, place, and time.  Skin: Skin is warm and dry. No rash noted.  Psychiatric: She has a normal mood and affect. Her behavior is normal.          Assessment & Plan:   URI/Sinusitis  Use saline irrigation, warm  moist compresses and over-the-counter decongestants only as directed.  Call if there is no improvement in 5 to 7 days, or sooner if you develop increasing pain, fever, or any new symptoms.  HTN-stable

## 2012-06-07 ENCOUNTER — Telehealth: Payer: Self-pay | Admitting: Internal Medicine

## 2012-06-07 NOTE — Telephone Encounter (Signed)
[facility] Triage Call Report Triage Record Num: [id number] Operator: Hyman Bower Patient Name: Laurie Dominguez Call Date & Time: 06/05/2012 8:42:24AM Patient Phone: [phone] PCP: Gordy Savers Patient Gender: Female PCP Fax : [phone] Patient DOB: [birth year] [facility] Name[facility] Reason for Call: Caller: [name]; PCP: Eleonore Chiquito (Family Practice); CB#: [phone]; Call regarding Cough/Congestion; Onset 06/02/12. Reports nasal congestion, sore throat, hoarse voice, low grade fever at times, sinus pain/pressure is severe, productive cough. Sinus pain is keeping patient awake during the night due to pain. Triaged per Upper Respiratory Infection guideline, disposition: see provider within 24 hours for "Pressure/pain above or below eyes, near ears over sinuses and yellow-green drainage from nose or down back of throat." Appointment scheduled 06/05/12 at 10:15am with Dr. Patsy Lager at the [place] office. Address of office given to patient. Advised to call back if any new symptoms develop or symptoms worsen. Patient verbalized understanding. Protocol(s) Used: Upper Respiratory Infection (URI) Recommended Outcome per Protocol: See Provider within 24 hours Reason for Outcome: Pressure/pain above or below eyes, near ears over sinuses (may also be described as fullness, worsens when bending forward, teeth or eye pain) AND yellow-green drainage from nose or down back of throat. Care Advice: ~ 03/

## 2012-07-20 ENCOUNTER — Other Ambulatory Visit: Payer: Self-pay | Admitting: Internal Medicine

## 2012-11-01 ENCOUNTER — Other Ambulatory Visit: Payer: Self-pay | Admitting: Internal Medicine

## 2012-12-03 ENCOUNTER — Other Ambulatory Visit: Payer: Self-pay | Admitting: Internal Medicine

## 2013-01-05 ENCOUNTER — Other Ambulatory Visit: Payer: Self-pay | Admitting: *Deleted

## 2013-01-05 MED ORDER — TRAMADOL HCL 50 MG PO TABS: ORAL_TABLET | ORAL | Status: DC

## 2013-01-05 MED ORDER — LOSARTAN POTASSIUM-HCTZ 100-25 MG PO TABS: ORAL_TABLET | ORAL | Status: DC

## 2013-02-16 ENCOUNTER — Telehealth: Payer: Self-pay | Admitting: Internal Medicine

## 2013-02-16 NOTE — Telephone Encounter (Signed)
Noted  

## 2013-02-16 NOTE — Telephone Encounter (Signed)
Patient Information:  Caller Name: Terra  Phone: [phone]  Patient: Laurie, Dominguez  Gender: Female  DOB: 29-Jul-1950  Age: 62 Years  PCP: Eleonore Chiquito (Family Practice > 75yrs old)  Office Follow Up:  Does the office need to follow up with this patient?: No  Instructions For The Office: N/A  RN Note:  Informed MD standing orders require appointment for antibiotic. Wakes at night becaue of nasal congestion. Headaches at night rated 7/10 and 3/10 when upright during the day. Declined to schedule now.  Plans to call for appointment 02/18/13 if symptoms continue.  Symptoms  Reason For Call & Symptoms: Called to request an antibiotic for suspected sinusitis.  Reported nasal stuffiness, sore throat, right earache and headaches.  Reviewed Health History In EMR: Yes  Reviewed Medications In EMR: Yes  Reviewed Allergies In EMR: Yes  Reviewed Surgeries / Procedures: Yes  Date of Onset of Symptoms: 02/13/2013  Treatments Tried: Chinese soup  Treatments Tried Worked: Yes  Guideline(s) Used:  Sinus Pain and Congestion  Disposition Per Guideline:   See Today in Office  Reason For Disposition Reached:   Earache  Advice Given:  Reassurance:   Sinus congestion is a normal part of a cold.  Usually home treatment with nasal washes can prevent an actual bacterial sinus infection.  Antibiotics are not helpful for the sinus congestion that occurs with colds.  For a Stuffy Nose - Use Nasal Washes:  Introduction: Saline (salt water) nasal irrigation (nasal wash) is an effective and simple home remedy for treating stuffy nose and sinus congestion. The nose can be irrigated by pouring, spraying, or squirting salt water into the nose and then letting it run back out.  Medicines for a Stuffy or Runny Nose:  Most cold medicines that are available over-the-counter (OTC) are not helpful.  Antihistamines: Are only helpful if you also have nasal allergies.  Pain and Fever Medicines:  For  pain or fever relief, take either acetaminophen or ibuprofen.  Treat fevers above 101 F (38.3 C). The goal of fever therapy is to bring the fever down to a comfortable level. Remember that fever medicine usually lowers fever 2 degrees F (1 - 1 1/2 degrees C).  Hydration:  Drink plenty of liquids (6-8 glasses of water daily). If the air in your home is dry, use a cool mist humidifier  Expected Course:  Sinus congestion from viral upper respiratory infections (colds) usually lasts 5-10 days.  Occasionally a cold can worsen and turn into bacterial sinusitis. Clues to this are sinus symptoms lasting longer than 10 days, fever lasting longer than 3 days, and worsening pain. Bacterial sinusitis may need antibiotic treatment.  Call Back If:   You become worse.  Patient Refused Recommendation:  Patient Refused Appt, Patient Requests Appt At Later Date  Declined to schedule appointment 02/16/13 due to no car.  Plans to call for appointment 02/18/13 if symptoms continue.

## 2013-03-09 ENCOUNTER — Other Ambulatory Visit: Payer: Self-pay | Admitting: Internal Medicine

## 2013-04-05 ENCOUNTER — Ambulatory Visit (INDEPENDENT_AMBULATORY_CARE_PROVIDER_SITE_OTHER): Payer: No Typology Code available for payment source | Admitting: Internal Medicine

## 2013-04-05 ENCOUNTER — Encounter: Payer: Self-pay | Admitting: Internal Medicine

## 2013-04-05 VITALS — BP 140/82 | HR 94 | Temp 98.3°F | Resp 20 | Ht 63.5 in | Wt 303.0 lb

## 2013-04-05 DIAGNOSIS — I10 Essential (primary) hypertension: Secondary | ICD-10-CM

## 2013-04-05 MED ORDER — LOSARTAN POTASSIUM-HCTZ 100-25 MG PO TABS: ORAL_TABLET | ORAL | Status: DC

## 2013-04-05 MED ORDER — AMLODIPINE BESYLATE 5 MG PO TABS: ORAL_TABLET | ORAL | Status: DC

## 2013-04-05 MED ORDER — TRAMADOL HCL 50 MG PO TABS: ORAL_TABLET | ORAL | Status: DC

## 2013-04-05 MED ORDER — PAROXETINE HCL 30 MG PO TABS: ORAL_TABLET | ORAL | Status: DC

## 2013-04-05 MED ORDER — FUROSEMIDE 40 MG PO TABS: 40.0000 mg | ORAL_TABLET | ORAL | Status: DC | PRN

## 2013-04-05 MED ORDER — DIAZEPAM 2 MG PO TABS: 2.0000 mg | ORAL_TABLET | Freq: Every evening | ORAL | Status: DC | PRN

## 2013-04-05 MED ORDER — OMEPRAZOLE 20 MG PO CPDR: DELAYED_RELEASE_CAPSULE | ORAL | Status: DC

## 2013-04-05 MED ORDER — NAPROXEN 500 MG PO TABS: 500.0000 mg | ORAL_TABLET | Freq: Two times a day (BID) | ORAL | Status: DC

## 2013-04-05 NOTE — Patient Instructions (Signed)
Limit your sodium (Salt) intake  Please check your blood pressure on a regular basis.  If it is consistently greater than 150/90, please make an office appointment.  You need to lose weight.  Consider a lower calorie diet and regular exercise.

## 2013-04-05 NOTE — Progress Notes (Signed)
Subjective:    Patient ID: Laurie Dominguez, female    DOB: [birth year], 63 y.o.   MRN: [id number]  HPI  63 year old patient who is in today for followup. She has a history of hypertension obesity anxiety depression. She is doing well except for right knee pain. This has been present for 3-4 weeks. She uses a motorized wheelchair.  Past Medical History  Diagnosis Date  . Hypertension   . GERD (gastroesophageal reflux disease)   . Depression   . Obesity   . Nephrolithiasis     History   Social History  . Marital Status: Married    Spouse Name: N/A    Number of Children: N/A  . Years of Education: N/A   Occupational History  . Not on file.   Social History Main Topics  . Smoking status: Former Smoker    Quit date: 03/24/2006  . Smokeless tobacco: Never Used  . Alcohol Use: Yes  . Drug Use: Not on file  . Sexual Activity: Not on file   Other Topics Concern  . Not on file   Social History Narrative  . No narrative on file    Past Surgical History  Procedure Laterality Date  . Cholecystectomy    . Tonsillectomy      Family History  Problem Relation Age of Onset  . Cancer Mother   . Other Father     spinal cord tumor    Allergies  Allergen Reactions  . Codeine Phosphate     Current Outpatient Prescriptions on File Prior to Visit  Medication Sig Dispense Refill  . amLODipine (NORVASC) 5 MG tablet TAKE ONE TABLET BY MOUTH ONCE DAILY  90 tablet  0  . diazepam (VALIUM) 2 MG tablet Take 1 tablet (2 mg total) by mouth at bedtime as needed.  30 tablet  1  . furosemide (LASIX) 40 MG tablet Take 1 tablet (40 mg total) by mouth as needed.  90 tablet  6  . losartan-hydrochlorothiazide (HYZAAR) 100-25 MG per tablet TAKE ONE TABLET BY MOUTH EVERY DAY  90 tablet  3  . meclizine (ANTIVERT) 25 MG tablet Take 1 tablet (25 mg total) by mouth as needed.  60 tablet  4  . naproxen (NAPROSYN) 500 MG tablet Take 1 tablet (500 mg total) by mouth 2 (two) times daily with a meal.  60  tablet  4  . omeprazole (PRILOSEC) 20 MG capsule TAKE ONE CAPSULE BY MOUTH EVERY DAY  90 capsule  1  . PARoxetine (PAXIL) 30 MG tablet TAKE TWO TABLETS BY MOUTH IN THE MORNING  180 tablet  1  . traMADol (ULTRAM) 50 MG tablet TAKE ONE TABLET BY MOUTH EVERY 6 HOURS AS NEEDED FOR PAIN  90 tablet  3   No current facility-administered medications on file prior to visit.    BP 140/82  Pulse 94  Temp(Src) 98.3 F (36.8 C) (Oral)  Resp 20  Ht 5' 3.5" (1.613 m)  Wt 303 lb (137.44 kg)  BMI 52.83 kg/m2  SpO2 95%       Review of Systems  Constitutional: Negative.   HENT: Negative for congestion, dental problem, hearing loss, rhinorrhea, sinus pressure, sore throat and tinnitus.   Eyes: Negative for pain, discharge and visual disturbance.  Respiratory: Negative for cough and shortness of breath.   Cardiovascular: Negative for chest pain, palpitations and leg swelling.  Gastrointestinal: Negative for nausea, vomiting, abdominal pain, diarrhea, constipation, blood in stool and abdominal distention.  Genitourinary: Negative for dysuria, urgency, frequency,  hematuria, flank pain, vaginal bleeding, vaginal discharge, difficulty urinating, vaginal pain and pelvic pain.  Musculoskeletal: Positive for arthralgias and gait problem. Negative for joint swelling.  Skin: Negative for rash.  Neurological: Negative for dizziness, syncope, speech difficulty, weakness, numbness and headaches.  Hematological: Negative for adenopathy.  Psychiatric/Behavioral: Negative for behavioral problems, dysphoric mood and agitation. The patient is not nervous/anxious.        Objective:   Physical Exam  Constitutional: She is oriented to person, place, and time. She appears well-developed and well-nourished. No distress.  Obese blood pressure 124/72 Sitting in a motorized wheelchair   HENT:  Head: Normocephalic.  Right Ear: External ear normal.  Left Ear: External ear normal.  Mouth/Throat: Oropharynx is clear  and moist.  Eyes: Conjunctivae and EOM are normal. Pupils are equal, round, and reactive to light.  Neck: Normal range of motion. Neck supple. No thyromegaly present.  Cardiovascular: Normal rate, regular rhythm, normal heart sounds and intact distal pulses.   Pulmonary/Chest: Effort normal and breath sounds normal.  Abdominal: Soft. Bowel sounds are normal. She exhibits no mass. There is no tenderness.  Musculoskeletal: Normal range of motion.  No obvious right knee effusion or other inflammatory changes  Lymphadenopathy:    She has no cervical adenopathy.  Neurological: She is alert and oriented to person, place, and time.  Skin: Skin is warm and dry. No rash noted.  Psychiatric: She has a normal mood and affect. Her behavior is normal.          Assessment & Plan:   Right knee pain. We'll continue naproxen and observe. If unimproved we'll set up for orthopedic referral Hypertension stable. Medications refilled  Recheck 6 months

## 2013-04-05 NOTE — Progress Notes (Signed)
Pre-visit discussion using our clinic review tool. No additional management support is needed unless otherwise documented below in the visit note.  

## 2013-04-27 ENCOUNTER — Telehealth: Payer: Self-pay | Admitting: Internal Medicine

## 2013-04-27 NOTE — Telephone Encounter (Signed)
Relevant patient education mailed to patient.  

## 2013-09-09 ENCOUNTER — Other Ambulatory Visit: Payer: Self-pay | Admitting: Internal Medicine

## 2014-01-27 ENCOUNTER — Other Ambulatory Visit: Payer: Self-pay | Admitting: Internal Medicine

## 2014-02-06 ENCOUNTER — Other Ambulatory Visit: Payer: Self-pay | Admitting: Internal Medicine

## 2014-02-13 ENCOUNTER — Other Ambulatory Visit: Payer: Self-pay | Admitting: Internal Medicine

## 2014-03-08 ENCOUNTER — Telehealth: Payer: Self-pay | Admitting: Internal Medicine

## 2014-03-08 MED ORDER — TRAMADOL HCL 50 MG PO TABS: 50.0000 mg | ORAL_TABLET | Freq: Four times a day (QID) | ORAL | Status: DC | PRN

## 2014-03-08 NOTE — Telephone Encounter (Signed)
Spoke to pt, told her I called in 90 tablets of Tramadol for her. Told her she should only be taking one tablet every 6 hours as needed and she is due for a physical. Pt verbalized understanding. Pt transferred to scheduling to schedule physical for Jan. Rx called into pharmacy.

## 2014-03-08 NOTE — Telephone Encounter (Signed)
Pt request refill of the following: traMADol (ULTRAM) 50 MG tablet  Pt said she was taking 2 in the morning and 2 in the evening    Phamacy: [facility]

## 2014-03-13 ENCOUNTER — Other Ambulatory Visit: Payer: Self-pay | Admitting: Internal Medicine

## 2014-03-31 ENCOUNTER — Other Ambulatory Visit (INDEPENDENT_AMBULATORY_CARE_PROVIDER_SITE_OTHER): Payer: No Typology Code available for payment source

## 2014-03-31 DIAGNOSIS — Z0000 Encounter for general adult medical examination without abnormal findings: Secondary | ICD-10-CM

## 2014-03-31 LAB — CBC WITH DIFFERENTIAL/PLATELET
Basophils Absolute: 0 10*3/uL (ref 0.0–0.1)
Basophils Relative: 0.4 % (ref 0.0–3.0)
EOS ABS: 0.2 10*3/uL (ref 0.0–0.7)
Eosinophils Relative: 2.4 % (ref 0.0–5.0)
HCT: 39.8 % (ref 36.0–46.0)
Hemoglobin: 13 g/dL (ref 12.0–15.0)
LYMPHS ABS: 1.6 10*3/uL (ref 0.7–4.0)
Lymphocytes Relative: 17.1 % (ref 12.0–46.0)
MCHC: 32.6 g/dL (ref 30.0–36.0)
MCV: 87.1 fl (ref 78.0–100.0)
MONOS PCT: 4.8 % (ref 3.0–12.0)
Monocytes Absolute: 0.4 10*3/uL (ref 0.1–1.0)
Neutro Abs: 6.9 10*3/uL (ref 1.4–7.7)
Neutrophils Relative %: 75.3 % (ref 43.0–77.0)
PLATELETS: 348 10*3/uL (ref 150.0–400.0)
RBC: 4.57 Mil/uL (ref 3.87–5.11)
RDW: 14.6 % (ref 11.5–15.5)
WBC: 9.2 10*3/uL (ref 4.0–10.5)

## 2014-03-31 LAB — POCT URINALYSIS DIPSTICK
Bilirubin, UA: NEGATIVE
Blood, UA: NEGATIVE
GLUCOSE UA: NEGATIVE
Ketones, UA: NEGATIVE
Leukocytes, UA: NEGATIVE
NITRITE UA: NEGATIVE
Urobilinogen, UA: 0.2
pH, UA: 6

## 2014-03-31 LAB — LIPID PANEL
Cholesterol: 205 mg/dL — ABNORMAL HIGH (ref 0–200)
HDL: 31.2 mg/dL — ABNORMAL LOW (ref >39.00)
LDL Cholesterol: 152 mg/dL — ABNORMAL HIGH (ref 0–99)
NonHDL: 173.8
Total CHOL/HDL Ratio: 7
Triglycerides: 109 mg/dL (ref 0.0–149.0)
VLDL: 21.8 mg/dL (ref 0.0–40.0)

## 2014-03-31 LAB — BASIC METABOLIC PANEL
BUN: 20 mg/dL (ref 6–23)
CHLORIDE: 103 meq/L (ref 96–112)
CO2: 30 meq/L (ref 19–32)
CREATININE: 0.7 mg/dL (ref 0.4–1.2)
Calcium: 8.8 mg/dL (ref 8.4–10.5)
GFR: 95.58 mL/min (ref >60.00)
GLUCOSE: 108 mg/dL — AB (ref 70–99)
Potassium: 3.3 mEq/L — ABNORMAL LOW (ref 3.5–5.1)
SODIUM: 142 meq/L (ref 135–145)

## 2014-03-31 LAB — HEPATIC FUNCTION PANEL
ALT: 14 U/L (ref 0–35)
AST: 12 U/L (ref 0–37)
Albumin: 3.7 g/dL (ref 3.5–5.2)
Alkaline Phosphatase: 95 U/L (ref 39–117)
Bilirubin, Direct: 0 mg/dL (ref 0.0–0.3)
Total Bilirubin: 0.5 mg/dL (ref 0.2–1.2)
Total Protein: 7.4 g/dL (ref 6.0–8.3)

## 2014-03-31 LAB — TSH: TSH: 3.58 u[IU]/mL (ref 0.35–4.50)

## 2014-04-11 ENCOUNTER — Encounter: Payer: Self-pay | Admitting: *Deleted

## 2014-04-11 ENCOUNTER — Ambulatory Visit (INDEPENDENT_AMBULATORY_CARE_PROVIDER_SITE_OTHER): Payer: No Typology Code available for payment source | Admitting: Internal Medicine

## 2014-04-11 ENCOUNTER — Encounter: Payer: Self-pay | Admitting: Internal Medicine

## 2014-04-11 VITALS — BP 128/70 | HR 96 | Temp 97.8°F | Resp 20 | Ht 63.5 in | Wt 298.0 lb

## 2014-04-11 DIAGNOSIS — Z0000 Encounter for general adult medical examination without abnormal findings: Secondary | ICD-10-CM

## 2014-04-11 DIAGNOSIS — K219 Gastro-esophageal reflux disease without esophagitis: Secondary | ICD-10-CM

## 2014-04-11 DIAGNOSIS — Z6841 Body Mass Index (BMI) 40.0 and over, adult: Secondary | ICD-10-CM | POA: Insufficient documentation

## 2014-04-11 DIAGNOSIS — I10 Essential (primary) hypertension: Secondary | ICD-10-CM

## 2014-04-11 DIAGNOSIS — Z87442 Personal history of urinary calculi: Secondary | ICD-10-CM

## 2014-04-11 DIAGNOSIS — R7302 Impaired glucose tolerance (oral): Secondary | ICD-10-CM | POA: Insufficient documentation

## 2014-04-11 MED ORDER — OMEPRAZOLE 20 MG PO CPDR: DELAYED_RELEASE_CAPSULE | ORAL | Status: DC

## 2014-04-11 MED ORDER — PAROXETINE HCL 30 MG PO TABS: 60.0000 mg | ORAL_TABLET | Freq: Every morning | ORAL | Status: DC

## 2014-04-11 NOTE — Progress Notes (Signed)
Subjective:    Patient ID: Laurie Dominguez, female    DOB: [birth year], 64 y.o.   MRN: [id number]  HPI   64 year old patient who is seen today for a preventive health examination.  Medical problems include hypertension, exogenous obesity, impaired glucose tolerance.  Doing quite well today. She is planning on joining [employer] and also some [employer] program when she turns 42.  In 2 months  Allergies: 1) ! Codeine Phosphate (Codeine Phosphate)  Past History:  Past Medical History: IGT Hypertension Nephrolithiasis, hx of Obesity GERD Depression  Past Medical History  Diagnosis Date  . Hypertension   . GERD (gastroesophageal reflux disease)   . Depression   . Obesity   . Nephrolithiasis     History   Social History  . Marital Status: Married    Spouse Name: N/A    Number of Children: N/A  . Years of Education: N/A   Occupational History  . Not on file.   Social History Main Topics  . Smoking status: Former Smoker    Quit date: 03/24/2006  . Smokeless tobacco: Never Used  . Alcohol Use: Yes  . Drug Use: Not on file  . Sexual Activity: Not on file   Other Topics Concern  . Not on file   Social History Narrative    Past Surgical History  Procedure Laterality Date  . Cholecystectomy    . Tonsillectomy      Family History  Problem Relation Age of Onset  . Cancer Mother   . Other Father     spinal cord tumor    Allergies  Allergen Reactions  . Codeine Phosphate     Current Outpatient Prescriptions on File Prior to Visit  Medication Sig Dispense Refill  . amLODipine (NORVASC) 5 MG tablet TAKE ONE TABLET BY MOUTH ONCE DAILY 90 tablet 1  . diazepam (VALIUM) 2 MG tablet Take 1 tablet (2 mg total) by mouth at bedtime as needed. 60 tablet 1  . furosemide (LASIX) 40 MG tablet Take 1 tablet (40 mg total) by mouth as needed. 90 tablet 6  . losartan-hydrochlorothiazide (HYZAAR) 100-25 MG per tablet TAKE ONE TABLET BY MOUTH EVERY DAY 90 tablet 3  .  meclizine (ANTIVERT) 25 MG tablet Take 1 tablet (25 mg total) by mouth as needed. 60 tablet 4  . naproxen (NAPROSYN) 500 MG tablet TAKE ONE TABLET BY MOUTH TWICE DAILY WITH A MEAL 60 tablet 0  . traMADol (ULTRAM) 50 MG tablet Take 1 tablet (50 mg total) by mouth every 6 (six) hours as needed. 90 tablet 0   No current facility-administered medications on file prior to visit.    BP 128/70 mmHg  Pulse 96  Temp(Src) 97.8 F (36.6 C) (Oral)  Resp 20  Ht 5' 3.5" (1.613 m)  Wt 298 lb (135.172 kg)  BMI 51.95 kg/m2  SpO2 96%     Review of Systems  Constitutional: Negative.   HENT: Negative for congestion, dental problem, hearing loss, rhinorrhea, sinus pressure, sore throat and tinnitus.   Eyes: Negative for pain, discharge and visual disturbance.  Respiratory: Negative for cough and shortness of breath.   Cardiovascular: Negative for chest pain, palpitations and leg swelling.  Gastrointestinal: Negative for nausea, vomiting, abdominal pain, diarrhea, constipation, blood in stool and abdominal distention.  Genitourinary: Negative for dysuria, urgency, frequency, hematuria, flank pain, vaginal bleeding, vaginal discharge, difficulty urinating, vaginal pain and pelvic pain.  Musculoskeletal: Positive for arthralgias and gait problem. Negative for joint swelling.  Skin: Negative for rash.  Neurological: Negative for dizziness, syncope, speech difficulty, weakness, numbness and headaches.  Hematological: Negative for adenopathy.  Psychiatric/Behavioral: Negative for behavioral problems, dysphoric mood and agitation. The patient is not nervous/anxious.        Objective:   Physical Exam  Constitutional: She is oriented to person, place, and time. She appears well-developed and well-nourished.  Morbidly obese Blood pressure 110/70  HENT:  Head: Normocephalic and atraumatic.  Right Ear: External ear normal.  Left Ear: External ear normal.  Mouth/Throat: Oropharynx is clear and moist.    Eyes: Conjunctivae and EOM are normal.  Neck: Normal range of motion. Neck supple. No JVD present. No thyromegaly present.  Cardiovascular: Normal rate, regular rhythm, normal heart sounds and intact distal pulses.   No murmur heard. Pulmonary/Chest: Effort normal and breath sounds normal. She has no wheezes. She has no rales.  Abdominal: Soft. Bowel sounds are normal. She exhibits no distension and no mass. There is no tenderness. There is no rebound and no guarding.  Genitourinary: Vagina normal.  Musculoskeletal: Normal range of motion. She exhibits no edema or tenderness.  Neurological: She is alert and oriented to person, place, and time. She has normal reflexes. No cranial nerve deficit. She exhibits normal muscle tone. Coordination normal.  Skin: Skin is warm and dry. No rash noted.  Psychiatric: She has a normal mood and affect. Her behavior is normal.          Assessment & Plan:   Preventive health examination Morbid obesity Hypertension, well-controlled Impaired glucose tolerance  Weight loss encouraged.  Mammogram and follow-up colonoscopy.  Encouraged We'll continue low-salt diet and home blood pressure monitoring Recheck one year

## 2014-04-11 NOTE — Patient Instructions (Addendum)
Limit your sodium (Salt) intake  You need to lose weight.  Consider a lower calorie diet and regular exercise.  Health Maintenance Adopting a healthy lifestyle and getting preventive care can go a long way to promote health and wellness. Talk with your health care provider about what schedule of regular examinations is right for you. This is a good chance for you to check in with your provider about disease prevention and staying healthy. In between checkups, there are plenty of things you can do on your own. Experts have done a lot of research about which lifestyle changes and preventive measures are most likely to keep you healthy. Ask your health care provider for more information. WEIGHT AND DIET  Eat a healthy diet  Be sure to include plenty of vegetables, fruits, low-fat dairy products, and lean protein.  Do not eat a lot of foods high in solid fats, added sugars, or salt.  Get regular exercise. This is one of the most important things you can do for your health.  Most adults should exercise for at least 150 minutes each week. The exercise should increase your heart rate and make you sweat (moderate-intensity exercise).  Most adults should also do strengthening exercises at least twice a week. This is in addition to the moderate-intensity exercise.  Maintain a healthy weight  Body mass index (BMI) is a measurement that can be used to identify possible weight problems. It estimates body fat based on height and weight. Your health care provider can help determine your BMI and help you achieve or maintain a healthy weight.  For females 28 years of age and older:   A BMI below 65 is considered underweight.  A BMI of 18.5 to 24.9 is normal.  A BMI of 25 to 29.9 is considered overweight.  A BMI of 30 and above is considered obese.  Watch levels of cholesterol and blood lipids  You should start having your blood tested for lipids and cholesterol at 65 years of age, then have  this test every 5 years.  You may need to have your cholesterol levels checked more often if:  Your lipid or cholesterol levels are high.  You are older than 65 years of age.  You are at high risk for heart disease.  CANCER SCREENING   Lung Cancer  Lung cancer screening is recommended for adults 40-47 years old who are at high risk for lung cancer because of a history of smoking.  A yearly low-dose CT scan of the lungs is recommended for people who:  Currently smoke.  Have quit within the past 15 years.  Have at least a 30-pack-year history of smoking. A pack year is smoking an average of one pack of cigarettes a day for 1 year.  Yearly screening should continue until it has been 15 years since you quit.  Yearly screening should stop if you develop a health problem that would prevent you from having lung cancer treatment.  Breast Cancer  Practice breast self-awareness. This means understanding how your breasts normally appear and feel.  It also means doing regular breast self-exams. Let your health care provider know about any changes, no matter how small.  If you are in your 20s or 30s, you should have a clinical breast exam (CBE) by a health care provider every 1-3 years as part of a regular health exam.  If you are 34 or older, have a CBE every year. Also consider having a breast X-ray (mammogram) every year.  If  you have a family history of breast cancer, talk to your health care provider about genetic screening.  If you are at high risk for breast cancer, talk to your health care provider about having an MRI and a mammogram every year.  Breast cancer gene (BRCA) assessment is recommended for women who have family members with BRCA-related cancers. BRCA-related cancers include:  Breast.  Ovarian.  Tubal.  Peritoneal cancers.  Results of the assessment will determine the need for genetic counseling and BRCA1 and BRCA2 testing. Cervical Cancer Routine pelvic  examinations to screen for cervical cancer are no longer recommended for nonpregnant women who are considered low risk for cancer of the pelvic organs (ovaries, uterus, and vagina) and who do not have symptoms. A pelvic examination may be necessary if you have symptoms including those associated with pelvic infections. Ask your health care provider if a screening pelvic exam is right for you.   The Pap test is the screening test for cervical cancer for women who are considered at risk.  If you had a hysterectomy for a problem that was not cancer or a condition that could lead to cancer, then you no longer need Pap tests.  If you are older than 65 years, and you have had normal Pap tests for the past 10 years, you no longer need to have Pap tests.  If you have had past treatment for cervical cancer or a condition that could lead to cancer, you need Pap tests and screening for cancer for at least 20 years after your treatment.  If you no longer get a Pap test, assess your risk factors if they change (such as having a new sexual partner). This can affect whether you should start being screened again.  Some women have medical problems that increase their chance of getting cervical cancer. If this is the case for you, your health care provider may recommend more frequent screening and Pap tests.  The human papillomavirus (HPV) test is another test that may be used for cervical cancer screening. The HPV test looks for the virus that can cause cell changes in the cervix. The cells collected during the Pap test can be tested for HPV.  The HPV test can be used to screen women 65 years of age and older. Getting tested for HPV can extend the interval between normal Pap tests from three to five years.  An HPV test also should be used to screen women of any age who have unclear Pap test results.  After 65 years of age, women should have HPV testing as often as Pap tests.  Colorectal Cancer  This type of  cancer can be detected and often prevented.  Routine colorectal cancer screening usually begins at 65 years of age and continues through 65 years of age.  Your health care provider may recommend screening at an earlier age if you have risk factors for colon cancer.  Your health care provider may also recommend using home test kits to check for hidden blood in the stool.  A small camera at the end of a tube can be used to examine your colon directly (sigmoidoscopy or colonoscopy). This is done to check for the earliest forms of colorectal cancer.  Routine screening usually begins at age 27.  Direct examination of the colon should be repeated every 5-10 years through 65 years of age. However, you may need to be screened more often if early forms of precancerous polyps or small growths are found. Skin Cancer  Check your skin from head to toe regularly.  Tell your health care provider about any new moles or changes in moles, especially if there is a change in a mole's shape or color.  Also tell your health care provider if you have a mole that is larger than the size of a pencil eraser.  Always use sunscreen. Apply sunscreen liberally and repeatedly throughout the day.  Protect yourself by wearing long sleeves, pants, a wide-brimmed hat, and sunglasses whenever you are outside. HEART DISEASE, DIABETES, AND HIGH BLOOD PRESSURE   Have your blood pressure checked at least every 1-2 years. High blood pressure causes heart disease and increases the risk of stroke.  If you are between 50 years and 48 years old, ask your health care provider if you should take aspirin to prevent strokes.  Have regular diabetes screenings. This involves taking a blood sample to check your fasting blood sugar level.  If you are at a normal weight and have a low risk for diabetes, have this test once every three years after 65 years of age.  If you are overweight and have a high risk for diabetes, consider being  tested at a younger age or more often. PREVENTING INFECTION  Hepatitis B  If you have a higher risk for hepatitis B, you should be screened for this virus. You are considered at high risk for hepatitis B if:  You were born in a country where hepatitis B is common. Ask your health care provider which countries are considered high risk.  Your parents were born in a high-risk country, and you have not been immunized against hepatitis B (hepatitis B vaccine).  You have HIV or AIDS.  You use needles to inject street drugs.  You live with someone who has hepatitis B.  You have had sex with someone who has hepatitis B.  You get hemodialysis treatment.  You take certain medicines for conditions, including cancer, organ transplantation, and autoimmune conditions. Hepatitis C  Blood testing is recommended for:  Everyone born from [birth year] through 1966.  Anyone with known risk factors for hepatitis C. Sexually transmitted infections (STIs)  You should be screened for sexually transmitted infections (STIs) including gonorrhea and chlamydia if:  You are sexually active and are younger than 65 years of age.  You are older than 65 years of age and your health care provider tells you that you are at risk for this type of infection.  Your sexual activity has changed since you were last screened and you are at an increased risk for chlamydia or gonorrhea. Ask your health care provider if you are at risk.  If you do not have HIV, but are at risk, it may be recommended that you take a prescription medicine daily to prevent HIV infection. This is called pre-exposure prophylaxis (PrEP). You are considered at risk if:  You are sexually active and do not regularly use condoms or know the HIV status of your partner(s).  You take drugs by injection.  You are sexually active with a partner who has HIV. Talk with your health care provider about whether you are at high risk of being infected with HIV. If  you choose to begin PrEP, you should first be tested for HIV. You should then be tested every 3 months for as long as you are taking PrEP.  PREGNANCY   If you are premenopausal and you may become pregnant, ask your health care provider about preconception counseling.  If you may become pregnant,  take 400 to 800 micrograms (mcg) of folic acid every day.  If you want to prevent pregnancy, talk to your health care provider about birth control (contraception). OSTEOPOROSIS AND MENOPAUSE   Osteoporosis is a disease in which the bones lose minerals and strength with aging. This can result in serious bone fractures. Your risk for osteoporosis can be identified using a bone density scan.  If you are 41 years of age or older, or if you are at risk for osteoporosis and fractures, ask your health care provider if you should be screened.  Ask your health care provider whether you should take a calcium or vitamin D supplement to lower your risk for osteoporosis.  Menopause may have certain physical symptoms and risks.  Hormone replacement therapy may reduce some of these symptoms and risks. Talk to your health care provider about whether hormone replacement therapy is right for you.  HOME CARE INSTRUCTIONS   Schedule regular health, dental, and eye exams.  Stay current with your immunizations.   Do not use any tobacco products including cigarettes, chewing tobacco, or electronic cigarettes.  If you are pregnant, do not drink alcohol.  If you are breastfeeding, limit how much and how often you drink alcohol.  Limit alcohol intake to no more than 1 drink per day for nonpregnant women. One drink equals 12 ounces of beer, 5 ounces of wine, or 1 ounces of hard liquor.  Do not use street drugs.  Do not share needles.  Ask your health care provider for help if you need support or information about quitting drugs.  Tell your health care provider if you often feel depressed.  Tell your health  care provider if you have ever been abused or do not feel safe at home. Document Released: 09/23/2010 Document Revised: 07/25/2013 Document Reviewed: 02/09/2013 [facility] Patient Information 2015 [place], [place]. This information is not intended to replace advice given to you by your health care provider. Make sure you discuss any questions you have with your health care provider.  Please check your blood pressure on a regular basis.  If it is consistently greater than 150/90, please make an office appointment.  Limit your sodium (Salt) intake  Schedule your colonoscopy to help detect colon cancer.

## 2014-04-11 NOTE — Progress Notes (Signed)
Pre visit review using our clinic review tool, if applicable. No additional management support is needed unless otherwise documented below in the visit note. 

## 2014-04-18 ENCOUNTER — Other Ambulatory Visit: Payer: Self-pay | Admitting: Internal Medicine

## 2014-06-02 ENCOUNTER — Other Ambulatory Visit: Payer: Self-pay | Admitting: *Deleted

## 2014-06-02 MED ORDER — LOSARTAN POTASSIUM-HCTZ 100-25 MG PO TABS: 1.0000 | ORAL_TABLET | Freq: Every day | ORAL | Status: DC

## 2014-06-02 MED ORDER — NAPROXEN 500 MG PO TABS: 500.0000 mg | ORAL_TABLET | Freq: Two times a day (BID) | ORAL | Status: DC

## 2014-06-02 MED ORDER — DIAZEPAM 2 MG PO TABS: 2.0000 mg | ORAL_TABLET | Freq: Every evening | ORAL | Status: DC | PRN

## 2014-06-02 MED ORDER — FUROSEMIDE 40 MG PO TABS: 40.0000 mg | ORAL_TABLET | ORAL | Status: DC | PRN

## 2014-06-02 MED ORDER — OMEPRAZOLE 20 MG PO CPDR: DELAYED_RELEASE_CAPSULE | ORAL | Status: DC

## 2014-06-02 MED ORDER — MECLIZINE HCL 25 MG PO TABS: 25.0000 mg | ORAL_TABLET | ORAL | Status: DC | PRN

## 2014-06-02 MED ORDER — PAROXETINE HCL 30 MG PO TABS: 60.0000 mg | ORAL_TABLET | Freq: Every morning | ORAL | Status: DC

## 2014-06-02 MED ORDER — AMLODIPINE BESYLATE 5 MG PO TABS: 5.0000 mg | ORAL_TABLET | Freq: Every day | ORAL | Status: DC

## 2014-06-02 MED ORDER — TRAMADOL HCL 50 MG PO TABS: 50.0000 mg | ORAL_TABLET | Freq: Four times a day (QID) | ORAL | Status: DC | PRN

## 2014-06-02 NOTE — Telephone Encounter (Signed)
Received fax from [place] for refills on all pt's medications. Rx refills done.

## 2014-06-07 ENCOUNTER — Other Ambulatory Visit: Payer: Self-pay | Admitting: *Deleted

## 2014-06-07 MED ORDER — FUROSEMIDE 40 MG PO TABS: 40.0000 mg | ORAL_TABLET | Freq: Every day | ORAL | Status: DC

## 2014-06-07 MED ORDER — MECLIZINE HCL 25 MG PO TABS: 25.0000 mg | ORAL_TABLET | Freq: Every day | ORAL | Status: DC | PRN

## 2014-08-16 ENCOUNTER — Telehealth: Payer: Self-pay | Admitting: Family Medicine

## 2014-08-16 DIAGNOSIS — Z1239 Encounter for other screening for malignant neoplasm of breast: Secondary | ICD-10-CM

## 2014-08-16 NOTE — Telephone Encounter (Signed)
Spoke to the pt.  She has not had her yearly mammogram but would like to proceed.  Order placed in the system and pt notified that she should be contacted in a few days.

## 2014-09-28 ENCOUNTER — Encounter: Payer: Self-pay | Admitting: Internal Medicine

## 2014-10-11 DIAGNOSIS — H524 Presbyopia: Secondary | ICD-10-CM | POA: Diagnosis not present

## 2014-10-11 DIAGNOSIS — H2513 Age-related nuclear cataract, bilateral: Secondary | ICD-10-CM | POA: Diagnosis not present

## 2015-01-24 ENCOUNTER — Other Ambulatory Visit: Payer: Self-pay | Admitting: Internal Medicine

## 2015-01-25 ENCOUNTER — Telehealth: Payer: Self-pay | Admitting: *Deleted

## 2015-01-25 MED ORDER — TRAMADOL HCL 50 MG PO TABS: 50.0000 mg | ORAL_TABLET | Freq: Four times a day (QID) | ORAL | Status: DC | PRN

## 2015-01-25 NOTE — Telephone Encounter (Signed)
[facility] is requesting a refill of  traMADol (ULTRAM) 50 MG tablet #90 One tab every 6 hours as needed [facility]

## 2015-01-25 NOTE — Telephone Encounter (Signed)
Rx called in to pharmacy. 

## 2015-03-08 ENCOUNTER — Other Ambulatory Visit: Payer: Self-pay | Admitting: Internal Medicine

## 2015-03-29 DIAGNOSIS — L821 Other seborrheic keratosis: Secondary | ICD-10-CM | POA: Diagnosis not present

## 2015-03-29 DIAGNOSIS — L718 Other rosacea: Secondary | ICD-10-CM | POA: Diagnosis not present

## 2015-03-29 DIAGNOSIS — Z85828 Personal history of other malignant neoplasm of skin: Secondary | ICD-10-CM | POA: Diagnosis not present

## 2015-03-29 DIAGNOSIS — L400 Psoriasis vulgaris: Secondary | ICD-10-CM | POA: Diagnosis not present

## 2015-04-30 ENCOUNTER — Other Ambulatory Visit: Payer: Self-pay | Admitting: Internal Medicine

## 2015-04-30 DIAGNOSIS — Z1231 Encounter for screening mammogram for malignant neoplasm of breast: Secondary | ICD-10-CM

## 2015-05-04 DIAGNOSIS — Z1231 Encounter for screening mammogram for malignant neoplasm of breast: Secondary | ICD-10-CM | POA: Diagnosis not present

## 2015-05-04 LAB — HM MAMMOGRAPHY: HM Mammogram: NEGATIVE

## 2015-05-14 ENCOUNTER — Encounter: Payer: Self-pay | Admitting: Internal Medicine

## 2015-06-18 ENCOUNTER — Other Ambulatory Visit: Payer: Self-pay | Admitting: Internal Medicine

## 2015-07-18 DIAGNOSIS — L218 Other seborrheic dermatitis: Secondary | ICD-10-CM | POA: Diagnosis not present

## 2015-07-18 DIAGNOSIS — L700 Acne vulgaris: Secondary | ICD-10-CM | POA: Diagnosis not present

## 2015-07-18 DIAGNOSIS — Z85828 Personal history of other malignant neoplasm of skin: Secondary | ICD-10-CM | POA: Diagnosis not present

## 2015-07-18 DIAGNOSIS — D225 Melanocytic nevi of trunk: Secondary | ICD-10-CM | POA: Diagnosis not present

## 2015-07-18 DIAGNOSIS — L821 Other seborrheic keratosis: Secondary | ICD-10-CM | POA: Diagnosis not present

## 2015-07-19 ENCOUNTER — Telehealth: Payer: Self-pay

## 2015-07-19 NOTE — Telephone Encounter (Signed)
No return call from pt as of yet.  Dr Carlean Purl, would you like an OV or may she be a direct [facility] pt (assuming her wt is the same or higher)?  Thank you, Djimon Lundstrom//PV

## 2015-07-19 NOTE — Telephone Encounter (Signed)
If BMI > 50 direct hospital  If < 50 direct [facility]

## 2015-07-23 NOTE — Telephone Encounter (Signed)
Sheri,     BMI too high but pt now wants to talk to Amy to consider cologuard.                                                                                                         Thanks, Angela/PV

## 2015-07-23 NOTE — Telephone Encounter (Signed)
We don't have APP appts at this time.  She can schedule the next available with Dr. Carlean Purl to discuss

## 2015-07-27 ENCOUNTER — Encounter: Payer: Self-pay | Admitting: *Deleted

## 2015-07-30 ENCOUNTER — Ambulatory Visit (INDEPENDENT_AMBULATORY_CARE_PROVIDER_SITE_OTHER): Payer: Medicare Other | Admitting: Physician Assistant

## 2015-07-30 ENCOUNTER — Encounter: Payer: Self-pay | Admitting: Physician Assistant

## 2015-07-30 VITALS — BP 130/58 | HR 84

## 2015-07-30 DIAGNOSIS — Z1211 Encounter for screening for malignant neoplasm of colon: Secondary | ICD-10-CM

## 2015-07-30 NOTE — Patient Instructions (Signed)
We have sent your demographic and insurance information to [employer]. They should contact you within the next week regarding your Cologuard (colon cancer screening) test. If you have not heard from them within the next week, please call our office at [phone]. Their phone number is:[phone]

## 2015-07-30 NOTE — Progress Notes (Signed)
Patient ID: Laurie Dominguez, female   DOB: 08/28/1950, 65 y.o.   MRN: [id number]   Subjective:    Patient ID: Laurie Dominguez, female    DOB: 1950-11-05, 65 y.o.   MRN: [id number]  HPI Laurie Dominguez is a pleasant 65 year old white female referred today by [facility] for colon cancer screening. She is known to Dr. Carlean Purl from colonoscopy done in May 2006. She had a 4 mm possible polyp removed and otherwise negative exam with the exception of internal hemorrhoids. Path from that possible polyp consistent with a hyperplastic  Polyp. She has no current GI complaints, specifically denies any abdominal pain, changes in bowel habits melena or hematochezia . Family history is negative for colon cancer. Patient is morbidly obese with BMI in excess of 50, she is debilitated and generally uses a scooter when she is out in a walker at home. She has hypertension, depression GERD and history of ureteral lithiasis. She states she does not want to have a colonoscopy and would like to have alternative colon cancer screening done.   Review of Systems Pertinent positive and negative review of systems were noted in the above HPI section.  All other review of systems was otherwise negative.  Outpatient Encounter Prescriptions as of 07/30/2015  Medication Sig  . amLODipine (NORVASC) 5 MG tablet Take 1 tablet by mouth  daily  . diazepam (VALIUM) 2 MG tablet Take 1 tablet (2 mg total) by mouth at bedtime as needed.  . furosemide (LASIX) 40 MG tablet Take 1 tablet (40 mg total) by mouth daily.  Marland Kitchen losartan-hydrochlorothiazide (HYZAAR) 100-25 MG tablet Take 1 tablet by mouth  daily  . meclizine (ANTIVERT) 25 MG tablet Take 1 tablet (25 mg total) by mouth daily as needed.  . naproxen (NAPROSYN) 500 MG tablet TAKE 1 TABLET BY MOUTH 2  TIMES DAILY WITH A MEAL.  Marland Kitchen omeprazole (PRILOSEC) 20 MG capsule Take 1 capsule by mouth  every day  . PARoxetine (PAXIL) 30 MG tablet Take 2 tablets by mouth  every morning  . traMADol (ULTRAM)  50 MG tablet Take 1 tablet (50 mg total) by mouth every 6 (six) hours as needed.   No facility-administered encounter medications on file as of 07/30/2015.   Allergies  Allergen Reactions  . Codeine Phosphate    Patient Active Problem List   Diagnosis Date Noted  . Impaired glucose tolerance 04/11/2014  . Morbid obesity ([place]) 04/11/2014  . DEPRESSION 12/21/2006  . Essential hypertension 12/21/2006  . GERD 12/21/2006  . NEPHROLITHIASIS, HX OF 12/21/2006   Social History   Social History  . Marital Status: Married    Spouse Name: N/A  . Number of Children: N/A  . Years of Education: N/A   Occupational History  . Not on file.   Social History Main Topics  . Smoking status: Former Smoker    Quit date: 03/24/2006  . Smokeless tobacco: Never Used  . Alcohol Use: No  . Drug Use: No  . Sexual Activity: Not on file   Other Topics Concern  . Not on file   Social History Narrative    Laurie Dominguez's family history includes Cancer in her mother; Liver cancer in her mother; Other in her father.      Objective:    Filed Vitals:   07/30/15 1337  BP: 130/58  Pulse: 84    Physical Exam    well-developed older white female in no acute distress, accompanied by her husband blood pressure 130/58 pulse 84, not weighed today she is  in a motorized scooter last BMI was 51.95. HEENT; nontraumatic normocephalic EOMI PERRLA sclera anicteric, Cardiovascular; regular rate and rhythm with S1-S2 no murmur rub or gallop, Pulmonary; decreased breath sounds bilateral bases, Abdomen ;morbidly obese, soft ,nontender bowel sounds are active she has an umbilical hernia double mass or hepatosplenomegaly, Rectal ;exam not done, Extremities; no clubbing cyanosis or edema skin warm and dry, Neuropsych ;mood and affect appropriate     Assessment & Plan:   #58 65 year old morbidly obese female with BMI in excess of 50 with decreased mobility, who comes in for colon cancer screening. She is of average risk,  asymptomatic and had 1 tiny hyperplastic polyp on colonoscopy 2006 #2 hypertension #3 depression #4 GERD #5  Ureterolithiasis  Plan;  Colon cancer screening options .discussed with the patient I think it is reasonable for this patient to do Cologuard for screening. Cologuard was discussed with the patient in detail and she would be agreeable to colonoscopy if Cologuard is positive. Colonoscopy would need to be scheduled at the hospital given BMI. Will proceed with Cologuard stool  DNA testing  Amy Genia Harold PA-C 07/30/2015   Cc: Laurie Lor, MD

## 2015-08-06 ENCOUNTER — Encounter: Payer: No Typology Code available for payment source | Admitting: Internal Medicine

## 2015-08-14 NOTE — Progress Notes (Signed)
Agree with Ms. Esterwood's assessment and plan. Beckham Capistran E. Mitsuye Schrodt, MD, FACG   

## 2015-08-29 DIAGNOSIS — Z1211 Encounter for screening for malignant neoplasm of colon: Secondary | ICD-10-CM | POA: Diagnosis not present

## 2015-08-29 DIAGNOSIS — Z1212 Encounter for screening for malignant neoplasm of rectum: Secondary | ICD-10-CM | POA: Diagnosis not present

## 2015-09-14 ENCOUNTER — Other Ambulatory Visit: Payer: Self-pay

## 2015-09-14 LAB — COLOGUARD: Cologuard: NEGATIVE

## 2015-09-26 ENCOUNTER — Ambulatory Visit (INDEPENDENT_AMBULATORY_CARE_PROVIDER_SITE_OTHER): Payer: Medicare Other | Admitting: Internal Medicine

## 2015-09-26 ENCOUNTER — Encounter: Payer: Self-pay | Admitting: Internal Medicine

## 2015-09-26 VITALS — BP 118/76 | HR 85 | Temp 98.4°F | Resp 20 | Ht 63.5 in | Wt 292.0 lb

## 2015-09-26 DIAGNOSIS — I10 Essential (primary) hypertension: Secondary | ICD-10-CM | POA: Diagnosis not present

## 2015-09-26 DIAGNOSIS — E785 Hyperlipidemia, unspecified: Secondary | ICD-10-CM

## 2015-09-26 DIAGNOSIS — Z0000 Encounter for general adult medical examination without abnormal findings: Secondary | ICD-10-CM

## 2015-09-26 DIAGNOSIS — Z87442 Personal history of urinary calculi: Secondary | ICD-10-CM | POA: Diagnosis not present

## 2015-09-26 DIAGNOSIS — R7302 Impaired glucose tolerance (oral): Secondary | ICD-10-CM

## 2015-09-26 DIAGNOSIS — Z23 Encounter for immunization: Secondary | ICD-10-CM | POA: Diagnosis not present

## 2015-09-26 LAB — COMPREHENSIVE METABOLIC PANEL
ALT: 10 U/L (ref 0–35)
AST: 10 U/L (ref 0–37)
Albumin: 4 g/dL (ref 3.5–5.2)
Alkaline Phosphatase: 101 U/L (ref 39–117)
BILIRUBIN TOTAL: 0.4 mg/dL (ref 0.2–1.2)
BUN: 14 mg/dL (ref 6–23)
CALCIUM: 9.4 mg/dL (ref 8.4–10.5)
CO2: 34 meq/L — AB (ref 19–32)
CREATININE: 0.74 mg/dL (ref 0.40–1.20)
Chloride: 100 mEq/L (ref 96–112)
GFR: 83.37 mL/min (ref >60.00)
GLUCOSE: 111 mg/dL — AB (ref 70–99)
Potassium: 3.7 mEq/L (ref 3.5–5.1)
Sodium: 142 mEq/L (ref 135–145)
TOTAL PROTEIN: 7.1 g/dL (ref 6.0–8.3)

## 2015-09-26 LAB — CBC WITH DIFFERENTIAL/PLATELET
BASOS ABS: 0 10*3/uL (ref 0.0–0.1)
BASOS PCT: 0.5 % (ref 0.0–3.0)
Eosinophils Absolute: 0.2 10*3/uL (ref 0.0–0.7)
Eosinophils Relative: 1.8 % (ref 0.0–5.0)
HEMATOCRIT: 43.5 % (ref 36.0–46.0)
Hemoglobin: 14.1 g/dL (ref 12.0–15.0)
LYMPHS ABS: 1.8 10*3/uL (ref 0.7–4.0)
LYMPHS PCT: 20.6 % (ref 12.0–46.0)
MCHC: 32.5 g/dL (ref 30.0–36.0)
MCV: 87.5 fl (ref 78.0–100.0)
MONOS PCT: 5.5 % (ref 3.0–12.0)
Monocytes Absolute: 0.5 10*3/uL (ref 0.1–1.0)
NEUTROS ABS: 6.3 10*3/uL (ref 1.4–7.7)
NEUTROS PCT: 71.6 % (ref 43.0–77.0)
PLATELETS: 363 10*3/uL (ref 150.0–400.0)
RBC: 4.97 Mil/uL (ref 3.87–5.11)
RDW: 14.3 % (ref 11.5–15.5)
WBC: 8.8 10*3/uL (ref 4.0–10.5)

## 2015-09-26 LAB — LIPID PANEL
CHOLESTEROL: 197 mg/dL (ref 0–200)
HDL: 36.8 mg/dL — AB (ref >39.00)
LDL Cholesterol: 135 mg/dL — ABNORMAL HIGH (ref 0–99)
NonHDL: 159.78
TRIGLYCERIDES: 124 mg/dL (ref 0.0–149.0)
Total CHOL/HDL Ratio: 5
VLDL: 24.8 mg/dL (ref 0.0–40.0)

## 2015-09-26 LAB — TSH: TSH: 4.3 u[IU]/mL (ref 0.35–4.50)

## 2015-09-26 MED ORDER — DIAZEPAM 2 MG PO TABS: 2.0000 mg | ORAL_TABLET | Freq: Every evening | ORAL | Status: DC | PRN

## 2015-09-26 MED ORDER — TRAMADOL HCL 50 MG PO TABS: 50.0000 mg | ORAL_TABLET | Freq: Four times a day (QID) | ORAL | Status: DC | PRN

## 2015-09-26 NOTE — Progress Notes (Signed)
Subjective:    Patient ID: Laurie Dominguez, female    DOB: 05-31-1950, 65 y.o.   MRN: [id number]  HPI   Wt Readings from Last 3 Encounters:  09/26/15 292 lb (132.45 kg)  04/11/14 298 lb (135.172 kg)  04/05/13 303 lb (137.61 kg)    65 year-old patient who is seen today for a preventive health examination.    Medical problems include hypertension, exogenous obesity, impaired glucose tolerance.  Doing quite well today.  Allergies: 1) ! Codeine Phosphate (Codeine Phosphate)  Past History:  Past Medical History: IGT Hypertension Nephrolithiasis, hx of Obesity GERD Depression  Past Medical History  Diagnosis Date  . Hypertension   . GERD (gastroesophageal reflux disease)   . Depression   . Obesity   . Nephrolithiasis   . Colon polyp 08/16/2004    Hyperplastic    Social History   Social History  . Marital Status: Married    Spouse Name: N/A  . Number of Children: N/A  . Years of Education: N/A   Occupational History  . Not on file.   Social History Main Topics  . Smoking status: Former Smoker    Quit date: 03/24/2006  . Smokeless tobacco: Never Used  . Alcohol Use: No  . Drug Use: No  . Sexual Activity: Not on file   Other Topics Concern  . Not on file   Social History Narrative    Past Surgical History  Procedure Laterality Date  . Cholecystectomy    . Tonsillectomy    . Dilation and curettage of uterus      Family History  Problem Relation Age of Onset  . Cancer Mother     unknown primary  . Other Father     spinal cord tumor  . Liver cancer Mother     unknown primary    Allergies  Allergen Reactions  . Codeine Phosphate     Current Outpatient Prescriptions on File Prior to Visit  Medication Sig Dispense Refill  . amLODipine (NORVASC) 5 MG tablet Take 1 tablet by mouth  daily 90 tablet 3  . furosemide (LASIX) 40 MG tablet Take 1 tablet (40 mg total) by mouth daily. 90 tablet 1  . losartan-hydrochlorothiazide (HYZAAR) 100-25 MG  tablet Take 1 tablet by mouth  daily 90 tablet 4  . meclizine (ANTIVERT) 25 MG tablet Take 1 tablet (25 mg total) by mouth daily as needed. 90 tablet 1  . naproxen (NAPROSYN) 500 MG tablet TAKE 1 TABLET BY MOUTH 2  TIMES DAILY WITH A MEAL. 180 tablet 3  . omeprazole (PRILOSEC) 20 MG capsule Take 1 capsule by mouth  every day 90 capsule 1  . PARoxetine (PAXIL) 30 MG tablet Take 2 tablets by mouth  every morning 180 tablet 1   No current facility-administered medications on file prior to visit.    BP 118/76 mmHg  Pulse 85  Temp(Src) 98.4 F (36.9 C) (Oral)  Resp 20  Ht 5' 3.5" (1.613 m)  Wt 292 lb (132.45 kg)  BMI 50.91 kg/m2  SpO2 96%  1. Risk factors, based on past  M,S,F history.  Cardiovascular risk factors include a history of hypertension  2.  Physical activities:limited due to obesity and arthritis  3.  Depression/mood:history of depression, which has been managed with Paxil.  Presently stable  4.  Hearing:no deficits  5.  ADL's:independent in aspects of daily living.  Requires some assistance with preparing meals only  6.  Fall risk:moderate due to arthritis and weight.  No  falls over the past year  7.  Home safety:no problems identified  8.  Height weight, and visual acuity;height and weight stable no change in visual acuity.  Has scheduled an eye examination August 2017  9.  Counseling:heart healthy diet.  Encouraged.  Patient will continue efforts at additional weight loss  10. Lab orders based on risk factors:laboratory profile including blood sugar, lipid profile will be reviewed  11. Referral :follow-up I examination  12. Care plan:continue efforts at aggressive risk factor modification  13. Cognitive assessment: alert and oriented with normal affect no cognitive dysfunction  14. Screening: Patient provided with a written and personalized 5-10 year screening schedule in the AVS.  Patient has had a recent ColoGuard and mammogram  15. Provider List Update:  primary care medicine ophthalmology and radiology     Review of Systems  Constitutional: Negative.   HENT: Negative for congestion, dental problem, hearing loss, rhinorrhea, sinus pressure, sore throat and tinnitus.   Eyes: Negative for pain, discharge and visual disturbance.  Respiratory: Negative for cough and shortness of breath.        Denies any daytime sleepiness  Cardiovascular: Negative for chest pain, palpitations and leg swelling.  Gastrointestinal: Negative for nausea, vomiting, abdominal pain, diarrhea, constipation, blood in stool and abdominal distention.  Genitourinary: Negative for dysuria, urgency, frequency, hematuria, flank pain, vaginal bleeding, vaginal discharge, difficulty urinating, vaginal pain and pelvic pain.  Musculoskeletal: Positive for arthralgias and gait problem. Negative for joint swelling.  Skin: Negative for rash.  Neurological: Negative for dizziness, syncope, speech difficulty, weakness, numbness and headaches.  Hematological: Negative for adenopathy.  Psychiatric/Behavioral: Negative for behavioral problems, dysphoric mood and agitation. The patient is not nervous/anxious.        Objective:   Physical Exam  Constitutional: She is oriented to person, place, and time. She appears well-developed and well-nourished.  Morbidly obese Blood pressure 110/70  HENT:  Head: Normocephalic and atraumatic.  Right Ear: External ear normal.  Left Ear: External ear normal.  Mouth/Throat: Oropharynx is clear and moist.  Pharyngeal crowding  Eyes: Conjunctivae and EOM are normal.  Neck: Normal range of motion. Neck supple. No JVD present. No thyromegaly present.  Cardiovascular: Normal rate, regular rhythm, normal heart sounds and intact distal pulses.   No murmur heard. Pulmonary/Chest: Effort normal and breath sounds normal. She has no wheezes. She has no rales.  Abdominal: Soft. Bowel sounds are normal. She exhibits no distension and no mass. There is no  tenderness. There is no rebound and no guarding.  Obese with a small umbilical hernia  Genitourinary: Vagina normal.  Musculoskeletal: Normal range of motion. She exhibits no edema or tenderness.  Neurological: She is alert and oriented to person, place, and time. She has normal reflexes. No cranial nerve deficit. She exhibits normal muscle tone. Coordination normal.  Skin: Skin is warm and dry. No rash noted.  Psychiatric: She has a normal mood and affect. Her behavior is normal.          Assessment & Plan:   Preventive health examination Morbid obesity Hypertension, well-controlled Impaired glucose tolerance  Weight loss encouraged.  Mammogram and follow-up colonoscopy.  Encouraged We'll continue low-salt diet and home blood pressure monitoring Recheck one year   Nyoka Cowden, MD

## 2015-09-26 NOTE — Patient Instructions (Signed)

## 2015-10-12 ENCOUNTER — Telehealth: Payer: Self-pay | Admitting: Internal Medicine

## 2015-10-12 NOTE — Telephone Encounter (Signed)
Per Dr. Yong Channel,  Cholesterol and glucose elevated; patient should continue plan to lose weight, watch diet, and exercise.   Pt aware.  Also, told patient to make appointment within 3 - 6 months to discuss and check on weight loss.

## 2015-10-12 NOTE — Telephone Encounter (Signed)
Pt would like blood work results from 09-26-15

## 2015-11-06 ENCOUNTER — Other Ambulatory Visit: Payer: Self-pay | Admitting: Internal Medicine

## 2015-11-06 NOTE — Telephone Encounter (Signed)
Rx refill sent to pharmacy. 

## 2015-11-16 DIAGNOSIS — I10 Essential (primary) hypertension: Secondary | ICD-10-CM | POA: Diagnosis not present

## 2015-11-16 DIAGNOSIS — H25819 Combined forms of age-related cataract, unspecified eye: Secondary | ICD-10-CM | POA: Diagnosis not present

## 2016-01-01 ENCOUNTER — Other Ambulatory Visit: Payer: Self-pay | Admitting: Internal Medicine

## 2016-02-10 ENCOUNTER — Other Ambulatory Visit: Payer: Self-pay | Admitting: Internal Medicine

## 2016-03-10 ENCOUNTER — Telehealth: Payer: Self-pay | Admitting: Internal Medicine

## 2016-03-10 ENCOUNTER — Other Ambulatory Visit (INDEPENDENT_AMBULATORY_CARE_PROVIDER_SITE_OTHER): Payer: Medicare Other

## 2016-03-10 ENCOUNTER — Other Ambulatory Visit: Payer: Self-pay | Admitting: Emergency Medicine

## 2016-03-10 DIAGNOSIS — R300 Dysuria: Secondary | ICD-10-CM | POA: Diagnosis not present

## 2016-03-10 LAB — POC URINALSYSI DIPSTICK (AUTOMATED)
Bilirubin, UA: NEGATIVE
Blood, UA: NEGATIVE
Glucose, UA: NEGATIVE
KETONES UA: NEGATIVE
LEUKOCYTES UA: NEGATIVE
Nitrite, UA: NEGATIVE
PH UA: 7
Spec Grav, UA: 1.02
Urobilinogen, UA: 1

## 2016-03-10 NOTE — Telephone Encounter (Signed)
Okay to place Order?

## 2016-03-10 NOTE — Telephone Encounter (Signed)
Pt would like to drop off a urine sample to check for a UTI.   May I have a order please?

## 2016-03-10 NOTE — Telephone Encounter (Signed)
okay

## 2016-03-10 NOTE — Telephone Encounter (Signed)
Called and spoke with pt informing her that order has been and okay to bring specimen to lab. Pt verbalized understaning nothing further needed at this time.

## 2016-03-11 ENCOUNTER — Telehealth: Payer: Self-pay | Admitting: Internal Medicine

## 2016-03-11 NOTE — Telephone Encounter (Signed)
Pt following up on results of urine sample for poss uti done yesterday. Pt states she really needs to hear something asap. Please advie

## 2016-03-11 NOTE — Telephone Encounter (Signed)
Spoke to pt, told her urine was negative. Need to drink plenty of water, no caffeine or cranberry juice, can take Cranberry tablets OTC. If symptoms persist please call office and schedule appt. Pt verbalized understanding.

## 2016-03-14 DIAGNOSIS — T1581XA Foreign body in other and multiple parts of external eye, right eye, initial encounter: Secondary | ICD-10-CM | POA: Diagnosis not present

## 2016-04-04 ENCOUNTER — Encounter (HOSPITAL_COMMUNITY): Payer: Self-pay | Admitting: Emergency Medicine

## 2016-04-04 ENCOUNTER — Observation Stay (HOSPITAL_COMMUNITY)
Admission: EM | Admit: 2016-04-04 | Discharge: 2016-04-07 | Disposition: A | Discharge status: Home / Self Care | Payer: Medicare Other | Attending: Family Medicine | Admitting: Family Medicine

## 2016-04-04 ENCOUNTER — Encounter: Payer: Self-pay | Admitting: Internal Medicine

## 2016-04-04 ENCOUNTER — Emergency Department (HOSPITAL_COMMUNITY): Payer: Medicare Other

## 2016-04-04 ENCOUNTER — Ambulatory Visit (INDEPENDENT_AMBULATORY_CARE_PROVIDER_SITE_OTHER): Payer: Medicare Other | Admitting: Internal Medicine

## 2016-04-04 VITALS — BP 152/64 | HR 99 | Temp 98.1°F | Ht 63.0 in | Wt 302.0 lb

## 2016-04-04 DIAGNOSIS — Z86010 Personal history of colonic polyps: Secondary | ICD-10-CM | POA: Diagnosis not present

## 2016-04-04 DIAGNOSIS — I2510 Atherosclerotic heart disease of native coronary artery without angina pectoris: Secondary | ICD-10-CM | POA: Insufficient documentation

## 2016-04-04 DIAGNOSIS — Z9049 Acquired absence of other specified parts of digestive tract: Secondary | ICD-10-CM | POA: Insufficient documentation

## 2016-04-04 DIAGNOSIS — I110 Hypertensive heart disease with heart failure: Secondary | ICD-10-CM | POA: Diagnosis not present

## 2016-04-04 DIAGNOSIS — R531 Weakness: Secondary | ICD-10-CM

## 2016-04-04 DIAGNOSIS — K219 Gastro-esophageal reflux disease without esophagitis: Secondary | ICD-10-CM | POA: Diagnosis not present

## 2016-04-04 DIAGNOSIS — I509 Heart failure, unspecified: Secondary | ICD-10-CM | POA: Diagnosis not present

## 2016-04-04 DIAGNOSIS — Z809 Family history of malignant neoplasm, unspecified: Secondary | ICD-10-CM | POA: Insufficient documentation

## 2016-04-04 DIAGNOSIS — Z87891 Personal history of nicotine dependence: Secondary | ICD-10-CM | POA: Diagnosis not present

## 2016-04-04 DIAGNOSIS — Z87442 Personal history of urinary calculi: Secondary | ICD-10-CM | POA: Insufficient documentation

## 2016-04-04 DIAGNOSIS — J9601 Acute respiratory failure with hypoxia: Secondary | ICD-10-CM | POA: Diagnosis present

## 2016-04-04 DIAGNOSIS — Z885 Allergy status to narcotic agent status: Secondary | ICD-10-CM | POA: Insufficient documentation

## 2016-04-04 DIAGNOSIS — F329 Major depressive disorder, single episode, unspecified: Secondary | ICD-10-CM | POA: Diagnosis not present

## 2016-04-04 DIAGNOSIS — R7302 Impaired glucose tolerance (oral): Secondary | ICD-10-CM | POA: Diagnosis not present

## 2016-04-04 DIAGNOSIS — R0902 Hypoxemia: Secondary | ICD-10-CM | POA: Diagnosis present

## 2016-04-04 DIAGNOSIS — I10 Essential (primary) hypertension: Secondary | ICD-10-CM | POA: Diagnosis present

## 2016-04-04 DIAGNOSIS — Z800 Family history of malignant neoplasm of digestive organs: Secondary | ICD-10-CM | POA: Diagnosis not present

## 2016-04-04 DIAGNOSIS — Z79899 Other long term (current) drug therapy: Secondary | ICD-10-CM | POA: Diagnosis not present

## 2016-04-04 DIAGNOSIS — J9621 Acute and chronic respiratory failure with hypoxia: Secondary | ICD-10-CM | POA: Diagnosis not present

## 2016-04-04 DIAGNOSIS — Z6841 Body Mass Index (BMI) 40.0 and over, adult: Secondary | ICD-10-CM | POA: Diagnosis not present

## 2016-04-04 DIAGNOSIS — Z808 Family history of malignant neoplasm of other organs or systems: Secondary | ICD-10-CM | POA: Insufficient documentation

## 2016-04-04 DIAGNOSIS — R0602 Shortness of breath: Secondary | ICD-10-CM | POA: Diagnosis not present

## 2016-04-04 LAB — CBC WITH DIFFERENTIAL/PLATELET
BASOS PCT: 0 %
Basophils Absolute: 0 10*3/uL (ref 0.0–0.1)
Eosinophils Absolute: 0.2 10*3/uL (ref 0.0–0.7)
Eosinophils Relative: 2 %
HEMATOCRIT: 42.3 % (ref 36.0–46.0)
Hemoglobin: 12.8 g/dL (ref 12.0–15.0)
LYMPHS ABS: 1.7 10*3/uL (ref 0.7–4.0)
Lymphocytes Relative: 17 %
MCH: 27.9 pg (ref 26.0–34.0)
MCHC: 30.3 g/dL (ref 30.0–36.0)
MCV: 92.2 fL (ref 78.0–100.0)
MONOS PCT: 7 %
Monocytes Absolute: 0.7 10*3/uL (ref 0.1–1.0)
NEUTROS ABS: 7.4 10*3/uL (ref 1.7–7.7)
NEUTROS PCT: 74 %
Platelets: 315 10*3/uL (ref 150–400)
RBC: 4.59 MIL/uL (ref 3.87–5.11)
RDW: 14.9 % (ref 11.5–15.5)
WBC: 10.1 10*3/uL (ref 4.0–10.5)

## 2016-04-04 LAB — URINALYSIS, ROUTINE W REFLEX MICROSCOPIC
BILIRUBIN URINE: NEGATIVE
Bacteria, UA: NONE SEEN
Glucose, UA: NEGATIVE mg/dL
HGB URINE DIPSTICK: NEGATIVE
Ketones, ur: NEGATIVE mg/dL
NITRITE: NEGATIVE
PH: 8 (ref 5.0–8.0)
Protein, ur: NEGATIVE mg/dL
SPECIFIC GRAVITY, URINE: 1.013 (ref 1.005–1.030)

## 2016-04-04 LAB — TROPONIN I: Troponin I: <0.03 ng/mL (ref <0.03)

## 2016-04-04 LAB — BRAIN NATRIURETIC PEPTIDE: B NATRIURETIC PEPTIDE 5: 20.4 pg/mL (ref 0.0–100.0)

## 2016-04-04 MED ORDER — IOPAMIDOL (ISOVUE-370) INJECTION 76%
INTRAVENOUS | Status: AC
  Filled 2016-04-04: qty 100

## 2016-04-04 MED ORDER — IPRATROPIUM-ALBUTEROL 0.5-2.5 (3) MG/3ML IN SOLN
3.0000 mL | Freq: Once | RESPIRATORY_TRACT | Status: AC
  Administered 2016-04-04: 3 mL via RESPIRATORY_TRACT
  Filled 2016-04-04: qty 3

## 2016-04-04 MED ORDER — IOPAMIDOL (ISOVUE-370) INJECTION 76%
100.0000 mL | Freq: Once | INTRAVENOUS | Status: AC | PRN
  Administered 2016-04-04: 100 mL via INTRAVENOUS

## 2016-04-04 NOTE — Patient Instructions (Addendum)
  Report to the emergency room immediately to further evaluate low oxygen level and to exclude an acute pulmonary embolism (Blood clots  To  Lungs)

## 2016-04-04 NOTE — Progress Notes (Signed)
Subjective:    Patient ID: Laurie Dominguez, female    DOB: 1950/09/12, 66 y.o.   MRN: [id number]  HPI  66 year old patient whose medical problems include essential hypertension, morbid obesity, as well as impaired glucose tolerance. She presents today with complaints of mild headache, increasing shortness of breath.  She has felt a bit unwell and slightly dizzy.  She states that she has had dyspnea on exertion for the past year, but this has worsened over the past week.  No increase in peripheral edema.  She does take Lasix when necessary  She is ambulatorywith a scooter and occasionally uses a walker  Wt Readings from Last 3 Encounters:  04/04/16 (!) 302 lb (137 kg)  09/26/15 292 lb (132.5 kg)  04/11/14 298 lb (135.2 kg)    Past Medical History:  Diagnosis Date  . Colon polyp 08/16/2004   Hyperplastic  . Depression   . GERD (gastroesophageal reflux disease)   . Hypertension   . Nephrolithiasis   . Obesity      Social History   Social History  . Marital status: Married    Spouse name: N/A  . Number of children: N/A  . Years of education: N/A   Occupational History  . Not on file.   Social History Main Topics  . Smoking status: Former Smoker    Quit date: 03/24/2006  . Smokeless tobacco: Never Used  . Alcohol use No  . Drug use: No  . Sexual activity: Not on file   Other Topics Concern  . Not on file   Social History Narrative  . No narrative on file    Past Surgical History:  Procedure Laterality Date  . CHOLECYSTECTOMY    . DILATION AND CURETTAGE OF UTERUS    . TONSILLECTOMY      Family History  Problem Relation Age of Onset  . Cancer Mother     unknown primary  . Other Father     spinal cord tumor  . Liver cancer Mother     unknown primary    Allergies  Allergen Reactions  . Codeine Phosphate     Current Outpatient Prescriptions on File Prior to Visit  Medication Sig Dispense Refill  . amLODipine (NORVASC) 5 MG tablet Take 1 tablet by  mouth  daily 90 tablet 3  . diazepam (VALIUM) 2 MG tablet Take 1 tablet (2 mg total) by mouth at bedtime as needed. 90 tablet 1  . furosemide (LASIX) 40 MG tablet Take 1 tablet (40 mg total) by mouth daily. 90 tablet 1  . losartan-hydrochlorothiazide (HYZAAR) 100-25 MG tablet TAKE 1 TABLET BY MOUTH  DAILY 90 tablet 1  . meclizine (ANTIVERT) 25 MG tablet Take 1 tablet (25 mg total) by mouth daily as needed. 90 tablet 1  . naproxen (NAPROSYN) 500 MG tablet TAKE 1 TABLET BY MOUTH 2  TIMES DAILY WITH A MEAL. 180 tablet 3  . omeprazole (PRILOSEC) 20 MG capsule Take 1 capsule by mouth  every day 90 capsule 3  . PARoxetine (PAXIL) 30 MG tablet TAKE 2 TABLETS BY MOUTH  EVERY MORNING 180 tablet 1  . traMADol (ULTRAM) 50 MG tablet Take 1 tablet (50 mg total) by mouth every 6 (six) hours as needed. 180 tablet 1   No current facility-administered medications on file prior to visit.     BP (!) 152/64 (BP Location: Right Arm, Patient Position: Sitting, Cuff Size: Large)   Pulse 99   Temp 98.1 F (36.7 C) (Oral)   Ht  5\' 3"  (1.6 m)   Wt (!) 302 lb (137 kg)   SpO2 (!) 80%   BMI 53.50 kg/m     Review of Systems  Constitutional: Positive for activity change and fatigue.  HENT: Negative for congestion, dental problem, hearing loss, rhinorrhea, sinus pressure, sore throat and tinnitus.   Eyes: Negative for pain, discharge and visual disturbance.  Respiratory: Positive for shortness of breath. Negative for cough.   Cardiovascular: Negative for chest pain, palpitations and leg swelling.  Gastrointestinal: Negative for abdominal distention, abdominal pain, blood in stool, constipation, diarrhea, nausea and vomiting.  Genitourinary: Negative for difficulty urinating, dysuria, flank pain, frequency, hematuria, pelvic pain, urgency, vaginal bleeding, vaginal discharge and vaginal pain.  Musculoskeletal: Negative for arthralgias, gait problem and joint swelling.  Skin: Negative for rash.  Neurological:  Positive for dizziness, weakness and light-headedness. Negative for syncope, speech difficulty, numbness and headaches.  Hematological: Negative for adenopathy.  Psychiatric/Behavioral: Negative for agitation, behavioral problems and dysphoric mood. The patient is not nervous/anxious.        Objective:   Physical Exam  Constitutional: She is oriented to person, place, and time. She appears well-developed and well-nourished.  Morbidly obese Repeat blood pressure 140/70 Pulse rate 90 Oxygen saturation 74-82  HENT:  Head: Normocephalic.  Right Ear: External ear normal.  Left Ear: External ear normal.  Mouth/Throat: Oropharynx is clear and moist.  Pharyngeal crowding  Eyes: Conjunctivae and EOM are normal. Pupils are equal, round, and reactive to light.  Neck: Normal range of motion. Neck supple. No thyromegaly present.  Cardiovascular: Normal rate, regular rhythm and intact distal pulses.   Murmur heard. Grade 2/6 systolic murmur loudest at the base  Pulmonary/Chest: Effort normal.  Diminished breath sounds at the bases with a few crackles  Abdominal: Soft. Bowel sounds are normal. She exhibits no mass. There is no tenderness.  Musculoskeletal: Normal range of motion. She exhibits tenderness. She exhibits no edema.  No pedal edema  Lymphadenopathy:    She has no cervical adenopathy.  Neurological: She is alert and oriented to person, place, and time.  Skin: Skin is warm and dry. No rash noted.  Psychiatric: She has a normal mood and affect. Her behavior is normal.          Assessment & Plan:   Resting hypoxemia.  Certainly likely component of obesity/hypoventilation syndrome. O2 saturation at last visit in July was 96%.  Rule out acute pulmonary embolism Morbid obesity Essential hypertension Impaired glucose tolerance  Patient agreeable to prompt ED evaluation.  Patient will need screening lab and possibly chest CTA to exclude acute/subacute pulmonary  embolism.  Nyoka Cowden

## 2016-04-04 NOTE — ED Triage Notes (Signed)
Pt c/o malaise, headache, SOB, unable to sleep due to being uncomfortable. No orthopnea or paroxysmal nocturnal dyspnea. O2 72% on room air.

## 2016-04-04 NOTE — ED Notes (Signed)
Bed: WA16 Expected date:  Expected time:  Means of arrival:  Comments: 

## 2016-04-04 NOTE — Progress Notes (Signed)
Pre visit review using our clinic review tool, if applicable. No additional management support is needed unless otherwise documented below in the visit note. 

## 2016-04-05 ENCOUNTER — Observation Stay (HOSPITAL_BASED_OUTPATIENT_CLINIC_OR_DEPARTMENT_OTHER): Payer: Medicare Other

## 2016-04-05 ENCOUNTER — Encounter (HOSPITAL_COMMUNITY): Payer: Self-pay | Admitting: Internal Medicine

## 2016-04-05 DIAGNOSIS — K219 Gastro-esophageal reflux disease without esophagitis: Secondary | ICD-10-CM

## 2016-04-05 DIAGNOSIS — J9601 Acute respiratory failure with hypoxia: Secondary | ICD-10-CM | POA: Diagnosis present

## 2016-04-05 DIAGNOSIS — R0902 Hypoxemia: Secondary | ICD-10-CM | POA: Diagnosis present

## 2016-04-05 DIAGNOSIS — I10 Essential (primary) hypertension: Secondary | ICD-10-CM

## 2016-04-05 DIAGNOSIS — J9602 Acute respiratory failure with hypercapnia: Secondary | ICD-10-CM | POA: Diagnosis not present

## 2016-04-05 DIAGNOSIS — R0600 Dyspnea, unspecified: Secondary | ICD-10-CM

## 2016-04-05 LAB — ECHOCARDIOGRAM COMPLETE
AVLVOTPG: 10 mmHg
CHL CUP MV DEC (S): 195
E decel time: 195 msec
E/e' ratio: 10.41
FS: 33 % (ref 28–44)
Height: 63 in
IV/PV OW: 0.93
LA ID, A-P, ES: 35 mm
LA diam end sys: 35 mm
LA diam index: 1.52 cm/m2
LA vol: 74.4 mL
LAVOLA4C: 69.6 mL
LAVOLIN: 32.3 mL/m2
LDCA: 3.14 cm2
LV E/e' medial: 10.41
LV E/e'average: 10.41
LV TDI E'LATERAL: 12.3
LV TDI E'MEDIAL: 13.4
LV e' LATERAL: 12.3 cm/s
LVOT SV: 104 mL
LVOT VTI: 33.1 cm
LVOT diameter: 20 mm
LVOTPV: 156 cm/s
Lateral S' vel: 15.6 cm/s
MV Peak grad: 7 mmHg
MV pk E vel: 128 m/s
MVPKAVEL: 156 m/s
PW: 12.6 mm — AB (ref 0.6–1.1)
RV TAPSE: 23.3 mm
Weight: 4832.48 oz

## 2016-04-05 LAB — BLOOD GAS, ARTERIAL
Acid-Base Excess: 8.3 mmol/L — ABNORMAL HIGH (ref 0.0–2.0)
BICARBONATE: 35.8 mmol/L — AB (ref 20.0–28.0)
Drawn by: 422461
O2 CONTENT: 2 L/min
O2 SAT: 89.5 %
PCO2 ART: 65.5 mmHg — AB (ref 32.0–48.0)
Patient temperature: 98
pH, Arterial: 7.354 (ref 7.350–7.450)
pO2, Arterial: 61.6 mmHg — ABNORMAL LOW (ref 83.0–108.0)

## 2016-04-05 LAB — INFLUENZA PANEL BY PCR (TYPE A & B)
Influenza A By PCR: NEGATIVE
Influenza B By PCR: NEGATIVE

## 2016-04-05 LAB — COMPREHENSIVE METABOLIC PANEL
ALK PHOS: 106 U/L (ref 38–126)
ALT: 12 U/L — AB (ref 14–54)
AST: 15 U/L (ref 15–41)
Albumin: 3.4 g/dL — ABNORMAL LOW (ref 3.5–5.0)
Anion gap: 7 (ref 5–15)
BILIRUBIN TOTAL: 0.6 mg/dL (ref 0.3–1.2)
BUN: 14 mg/dL (ref 6–20)
CALCIUM: 8.6 mg/dL — AB (ref 8.9–10.3)
CO2: 34 mmol/L — ABNORMAL HIGH (ref 22–32)
CREATININE: 0.68 mg/dL (ref 0.44–1.00)
Chloride: 98 mmol/L — ABNORMAL LOW (ref 101–111)
Glucose, Bld: 112 mg/dL — ABNORMAL HIGH (ref 65–99)
Potassium: 3.7 mmol/L (ref 3.5–5.1)
Sodium: 139 mmol/L (ref 135–145)
TOTAL PROTEIN: 6.9 g/dL (ref 6.5–8.1)

## 2016-04-05 LAB — MAGNESIUM: MAGNESIUM: 2 mg/dL (ref 1.7–2.4)

## 2016-04-05 LAB — TSH: TSH: 4.692 u[IU]/mL — AB (ref 0.350–4.500)

## 2016-04-05 LAB — PHOSPHORUS: PHOSPHORUS: 4 mg/dL (ref 2.5–4.6)

## 2016-04-05 MED ORDER — SODIUM CHLORIDE 0.9% FLUSH
3.0000 mL | Freq: Two times a day (BID) | INTRAVENOUS | Status: DC
  Administered 2016-04-05 – 2016-04-07 (×3): 3 mL via INTRAVENOUS

## 2016-04-05 MED ORDER — SODIUM CHLORIDE 0.9 % IV SOLN: 250.0000 mL | INTRAVENOUS | Status: DC | PRN

## 2016-04-05 MED ORDER — SODIUM CHLORIDE 0.9% FLUSH
3.0000 mL | Freq: Two times a day (BID) | INTRAVENOUS | Status: DC
  Administered 2016-04-05: 3 mL via INTRAVENOUS

## 2016-04-05 MED ORDER — ENOXAPARIN SODIUM 60 MG/0.6ML ~~LOC~~ SOLN
60.0000 mg | Freq: Every day | SUBCUTANEOUS | Status: DC
  Administered 2016-04-05 – 2016-04-07 (×3): 60 mg via SUBCUTANEOUS
  Filled 2016-04-05 (×3): qty 0.6

## 2016-04-05 MED ORDER — AMLODIPINE BESYLATE 5 MG PO TABS
5.0000 mg | ORAL_TABLET | Freq: Every day | ORAL | Status: DC
Start: 2016-04-05 — End: 2016-04-07
  Administered 2016-04-05 – 2016-04-07 (×3): 5 mg via ORAL
  Filled 2016-04-05 (×3): qty 1

## 2016-04-05 MED ORDER — ACETAMINOPHEN 325 MG PO TABS: 650.0000 mg | ORAL_TABLET | Freq: Four times a day (QID) | ORAL | Status: DC | PRN

## 2016-04-05 MED ORDER — ONDANSETRON HCL 4 MG PO TABS: 4.0000 mg | ORAL_TABLET | Freq: Four times a day (QID) | ORAL | Status: DC | PRN

## 2016-04-05 MED ORDER — GUAIFENESIN ER 600 MG PO TB12
600.0000 mg | ORAL_TABLET | Freq: Two times a day (BID) | ORAL | Status: DC
  Administered 2016-04-05 – 2016-04-07 (×5): 600 mg via ORAL
  Filled 2016-04-05 (×5): qty 1

## 2016-04-05 MED ORDER — ACETAMINOPHEN 650 MG RE SUPP: 650.0000 mg | Freq: Four times a day (QID) | RECTAL | Status: DC | PRN

## 2016-04-05 MED ORDER — FLUTICASONE PROPIONATE 50 MCG/ACT NA SUSP
2.0000 | Freq: Every day | NASAL | Status: DC
  Administered 2016-04-06 – 2016-04-07 (×2): 2 via NASAL
  Filled 2016-04-05: qty 16

## 2016-04-05 MED ORDER — IPRATROPIUM-ALBUTEROL 0.5-2.5 (3) MG/3ML IN SOLN
3.0000 mL | Freq: Four times a day (QID) | RESPIRATORY_TRACT | Status: DC
  Administered 2016-04-05 – 2016-04-07 (×11): 3 mL via RESPIRATORY_TRACT
  Filled 2016-04-05 (×11): qty 3

## 2016-04-05 MED ORDER — ALBUTEROL SULFATE (2.5 MG/3ML) 0.083% IN NEBU: 2.5000 mg | INHALATION_SOLUTION | RESPIRATORY_TRACT | Status: DC | PRN

## 2016-04-05 MED ORDER — ONDANSETRON HCL 4 MG/2ML IJ SOLN: 4.0000 mg | Freq: Four times a day (QID) | INTRAMUSCULAR | Status: DC | PRN

## 2016-04-05 MED ORDER — TRAMADOL HCL 50 MG PO TABS: 50.0000 mg | ORAL_TABLET | Freq: Four times a day (QID) | ORAL | Status: DC | PRN

## 2016-04-05 MED ORDER — ENOXAPARIN SODIUM 40 MG/0.4ML ~~LOC~~ SOLN: 40.0000 mg | SUBCUTANEOUS | Status: DC

## 2016-04-05 MED ORDER — SODIUM CHLORIDE 0.9% FLUSH: 3.0000 mL | INTRAVENOUS | Status: DC | PRN

## 2016-04-05 MED ORDER — DIAZEPAM 2 MG PO TABS
2.0000 mg | ORAL_TABLET | Freq: Every evening | ORAL | Status: DC | PRN
  Filled 2016-04-05: qty 1

## 2016-04-05 MED ORDER — PAROXETINE HCL 20 MG PO TABS
30.0000 mg | ORAL_TABLET | Freq: Every morning | ORAL | Status: DC
  Administered 2016-04-05 – 2016-04-07 (×3): 30 mg via ORAL
  Filled 2016-04-05 (×3): qty 2

## 2016-04-05 MED ORDER — IPRATROPIUM-ALBUTEROL 0.5-2.5 (3) MG/3ML IN SOLN: 3.0000 mL | Freq: Four times a day (QID) | RESPIRATORY_TRACT | Status: DC

## 2016-04-05 MED ORDER — PANTOPRAZOLE SODIUM 40 MG PO TBEC
40.0000 mg | DELAYED_RELEASE_TABLET | Freq: Every day | ORAL | Status: DC
  Administered 2016-04-05 – 2016-04-07 (×3): 40 mg via ORAL
  Filled 2016-04-05 (×3): qty 1

## 2016-04-05 NOTE — Progress Notes (Signed)
PROGRESS NOTE    Laurie Dominguez  [id number] DOB: [birth year] DOA: 04/04/2016 PCP: Nyoka Cowden, MD    Brief Narrative:  Laurie Dominguez is a 66 y.o. female with medical history significant of HTN, GERD, depression and morbid obesity.  Presented with shortness of breath and dyspnea on exertion worse for the past week and a half.  , Reports dyspnea on exertion been going on for the past 1 year she attributed to excessive weight she has no associated chest pain or leg edema but does take Lasix sometimes. Reports have had occasional ankle swelling. She used to smoke but not now, lately have had a runny nose denies cough, fever or wheezing. 1 Week ago she had burning with urination. She went to PCP and was told her Ua was negative. She has not been moving much, she has a lot of sinus drainage.  IN ER:  Temp (24hrs), Avg:98 F (36.7 C), Min:97.8 F (36.6 C), Max:98.1 F (36.7 C) R 16 oxygen saturation 76% on room air up to 92% on 5 L HR 84 WBC 10.1 hemoglobin 12.8 Troponin less than 0.03 BNP 20.4 CT and her chest no evidence of PE exercises and the base is noted prominent lymph nodes throughout the mediastinum likely reactive could not rule out edema no pleural effusions   Assessment & Plan:   Active Problems:   Essential hypertension   GERD   Morbid obesity ([place])   Hypoxia   Acute respiratory failure with hypoxia (HCC)   Acute respiratory failure with hypoxia and hypercarbia [facility])  - pulmonology consulted - encourage ambulation as toleration - echocardiogram pending - monitor on telemetry - patient states she has moments of panic with increased work of breathing - continue with nebulizers - add 20mg  PO lasix in am  Essential hypertension - stable - continue home medications  GERD - stable - continue home medications  Morbid obesity ([place]) - likely from excess caloric intake  DVT prophylaxis:    Lovenox    Code Status:  FULL CODE   as per patient   Family  Communication:   no family bedside Disposition Plan:     To home once workup is complete and patient is stable     Consultants:   Pulmonology  Procedures:   Echocardiogram on 04/05/16  Antimicrobials:   None    Subjective: Patient sitting in bed watching television.  Voices that over the last week she has noticed increased shortness of breath and then moments of panic with that shortness of breath.  Mentions she tried a diazepam and that seemed to have helped.  Objective: Vitals:   04/05/16 0740 04/05/16 0929 04/05/16 1158 04/05/16 1440  BP:    (!) 115/55  Pulse:    77  Resp:    18  Temp:    98.5 F (36.9 C)  TempSrc:    Oral  SpO2:  90% 92% 98%  Weight:      Height: 5\' 3"  (1.6 m)       Intake/Output Summary (Last 24 hours) at 04/05/16 1558 Last data filed at 04/05/16 0300  Gross per 24 hour  Intake                0 ml  Output                1 ml  Net               -1 ml   Filed Weights   04/05/16 0307 04/05/16 0325  Weight: (!) 137 kg (302 lb) (!) 137 kg (302 lb 0.5 oz)    Examination:  General exam: Appears calm and comfortable  Respiratory system: Respiratory effort normal. No accessory muscle use, few scattered crackles in bases Cardiovascular system: S1 & S2 heard, RRR. No JVD, murmurs, rubs, gallops or clicks. No pedal edema. Gastrointestinal system: Abdomen is obese, nondistended, soft and nontender. No organomegaly or masses felt. Normal bowel sounds heard. Central nervous system: Alert and oriented. No focal neurological deficits. Extremities: Symmetric 5 x 5 power. Skin: No rashes, lesions or ulcers Psychiatry: Judgement and insight appear normal. Mood & affect appropriate.     Data Reviewed: I have personally reviewed following labs and imaging studies  CBC:  Recent Labs Lab 04/04/16 1805  WBC 10.1  NEUTROABS 7.4  HGB 12.8  HCT 42.3  MCV 92.2  PLT 123456   Basic Metabolic Panel:  Recent Labs Lab 04/05/16 0240 04/05/16 0523  NA 139   --   K 3.7  --   CL 98*  --   CO2 34*  --   GLUCOSE 112*  --   BUN 14  --   CREATININE 0.68  --   CALCIUM 8.6*  --   MG  --  2.0  PHOS  --  4.0   GFR: Estimated Creatinine Clearance: 94.1 mL/min (by C-G formula based on SCr of 0.68 mg/dL). Liver Function Tests:  Recent Labs Lab 04/05/16 0240  AST 15  ALT 12*  ALKPHOS 106  BILITOT 0.6  PROT 6.9  ALBUMIN 3.4*   No results for input(s): LIPASE, AMYLASE in the last 168 hours. No results for input(s): AMMONIA in the last 168 hours. Coagulation Profile: No results for input(s): INR, PROTIME in the last 168 hours. Cardiac Enzymes:  Recent Labs Lab 04/04/16 1805  TROPONINI <0.03   BNP (last 3 results) No results for input(s): PROBNP in the last 8760 hours. HbA1C: No results for input(s): HGBA1C in the last 72 hours. CBG: No results for input(s): GLUCAP in the last 168 hours. Lipid Profile: No results for input(s): CHOL, HDL, LDLCALC, TRIG, CHOLHDL, LDLDIRECT in the last 72 hours. Thyroid Function Tests:  Recent Labs  04/05/16 0523  TSH 4.692*   Anemia Panel: No results for input(s): VITAMINB12, FOLATE, FERRITIN, TIBC, IRON, RETICCTPCT in the last 72 hours. Sepsis Labs: No results for input(s): PROCALCITON, LATICACIDVEN in the last 168 hours.  No results found for this or any previous visit (from the past 240 hour(s)).       Radiology Studies: Dg Chest 2 View  Result Date: 04/04/2016 CLINICAL DATA:  Shortness of breath. EXAM: CHEST  2 VIEW COMPARISON:  None. FINDINGS: The heart size and mediastinal contours are within normal limits. No pneumothorax or pleural effusion is noted. Left lung is clear. Fluid is noted in the right minor fissure. No definite consolidative process is noted. The visualized skeletal structures are unremarkable. IMPRESSION: Fluid noted in right minor fissure. No other definite abnormality seen in the chest. Electronically Signed   By: Marijo Conception, M.D.   On: 04/04/2016 17:20   Ct  Angio Chest Pe W And/or Wo Contrast  Result Date: 04/04/2016 CLINICAL DATA:  Headache, shortness of breath, dizziness. EXAM: CT ANGIOGRAPHY CHEST WITH CONTRAST TECHNIQUE: Multidetector CT imaging of the chest was performed using the standard protocol during bolus administration of intravenous contrast. Multiplanar CT image reconstructions and MIPs were obtained to evaluate the vascular anatomy. CONTRAST:  100 mL Isovue 370 COMPARISON:  12/05/2005 FINDINGS: Cardiovascular:  Good opacification of the central and segmental pulmonary arteries. No focal filling defects. No evidence of significant pulmonary embolus. Mild cardiac enlargement. No pericardial effusion. Coronary artery calcifications. Normal caliber thoracic aorta with mild calcification. No aortic aneurysm or dissection. Mediastinum/Nodes: Prominent lymph nodes throughout the mediastinum, largest in the right paratracheal region measuring 12 mm short axis dimension. No change since prior study. Likely reactive. Thyroid gland is diffusely enlarged without focal nodularity. Esophagus is decompressed. Lungs/Pleura: Motion artifact limits evaluation of lungs. Linear areas of atelectasis in the lung bases. No focal consolidation. Patchy mosaic attenuation centrally may be due to motion artifact or could indicate edema. No pleural effusions. No pneumothorax. Upper Abdomen: Left lobe of the liver appears enlarged, possibly indicating cirrhosis. Liver is incompletely included within the field of view and cannot be well characterized. Musculoskeletal: Mild degenerative changes in the spine. No destructive bone lesions. Review of the MIP images confirms the above findings. IMPRESSION: No evidence of significant pulmonary embolus. Cardiac enlargement. Mild mosaic attenuation pattern to the lungs could be due to motion artifact or may indicate edema. No pleural effusions. Atelectasis in the lung bases. Electronically Signed   By: Lucienne Capers M.D.   On: 04/04/2016  23:21        Scheduled Meds: . amLODipine  5 mg Oral Daily  . enoxaparin (LOVENOX) injection  60 mg Subcutaneous Daily  . fluticasone  2 spray Each Nare Daily  . guaiFENesin  600 mg Oral BID  . ipratropium-albuterol  3 mL Nebulization QID  . pantoprazole  40 mg Oral Daily  . PARoxetine  30 mg Oral q morning - 10a  . sodium chloride flush  3 mL Intravenous Q12H  . sodium chloride flush  3 mL Intravenous Q12H   Continuous Infusions:   LOS: 0 days    Time spent: 30 minutes    Loretha Stapler, MD [facility] Pager [phone]  If 7PM-7AM, please contact night-coverage [email or web address] Password [facility] 04/05/2016, 3:58 PM

## 2016-04-05 NOTE — H&P (Signed)
Laurie Dominguez [id number] DOB: April 09, 1950 DOA: 04/04/2016     PCP: Nyoka Cowden, MD   Outpatient Specialists: none Patient coming from:    home Lives With family    Chief Complaint: Shortness of breath  HPI: Laurie Dominguez is a 66 y.o. female with medical history significant of HTN, GERD, depression  Morbid obesity  Presented with shortness of breath and dyspnea on exertion worse for the past week and a half.  , Reports dyspnea on exertion been going on for the past 1 year she attributed to excessive weight she has no associated chest pain or leg edema but does take Lasix sometimes. Reports have had occasional ankle swelling. She used to smoke but not now, lately have had a runny nose denies cough, fever or wheezing. 1 Week ago she had burning with urination. She went to PCP and was told her Ua was negative. She has not been moving much, she has a lot of sinus drainage.    Regarding pertinent Chronic problems: Straight of hypertension controlled with Norvasc and losartan hydrochlorothiazide. History of morbid obesity  and insulin tolerance   IN ER:  Temp (24hrs), Avg:98 F (36.7 C), Min:97.8 F (36.6 C), Max:98.1 F (36.7 C)     R 16 oxygen saturation 76% on room air up to 92% on 5 L HR 84 WBC 10.1 hemoglobin 12.8 Troponin less than 0.03 BNP 20.4  CT and her chest no evidence of PE exercises and the base is noted prominent lymph nodes throughout the mediastinum likely reactive could not rule out edema no pleural effusions Following Medications were ordered in ER: Medications  iopamidol (ISOVUE-370) 76 % injection (not administered)  ipratropium-albuterol (DUONEB) 0.5-2.5 (3) MG/3ML nebulizer solution 3 mL (3 mLs Nebulization Given 04/04/16 1737)  iopamidol (ISOVUE-370) 76 % injection 100 mL (100 mLs Intravenous Contrast Given 04/04/16 2303)     Hospitalist was called for admission for Hypoxia  Review of Systems:    Pertinent positives include: shortness of  breath at rest, dyspnea on exertion,  Constitutional:  No weight loss, night sweats, Fevers, chills, fatigue, weight loss  HEENT:  No headaches, Difficulty swallowing,Tooth/dental problems,Sore throat,  No sneezing, itching, ear ache, nasal congestion, post nasal drip,  Cardio-vascular:  No chest pain, Orthopnea, PND, anasarca, dizziness, palpitations.no Bilateral lower extremity swelling  GI:  No heartburn, indigestion, abdominal pain, nausea, vomiting, diarrhea, change in bowel habits, loss of appetite, melena, blood in stool, hematemesis Resp:  no . No  No excess mucus, no productive cough, No non-productive cough, No coughing up of blood.No change in color of mucus.No wheezing. Skin:  no rash or lesions. No jaundice GU:  no dysuria, change in color of urine, no urgency or frequency. No straining to urinate.  No flank pain.  Musculoskeletal:  No joint pain or no joint swelling. No decreased range of motion. No back pain.  Psych:  No change in mood or affect. No depression or anxiety. No memory loss.  Neuro: no localizing neurological complaints, no tingling, no weakness, no double vision, no gait abnormality, no slurred speech, no confusion  As per HPI otherwise 10 point review of systems negative.   Past Medical History: Past Medical History:  Diagnosis Date  . Colon polyp 08/16/2004   Hyperplastic  . Depression   . GERD (gastroesophageal reflux disease)   . Hypertension   . Nephrolithiasis   . Obesity    Past Surgical History:  Procedure Laterality Date  . CHOLECYSTECTOMY    . DILATION AND CURETTAGE  OF UTERUS    . TONSILLECTOMY       Social History:  Ambulatory  walker Or motorized scooter  reports that she quit smoking about 10 years ago. She has never used smokeless tobacco. She reports that she does not drink alcohol or use drugs.  Allergies:   Allergies  Allergen Reactions  . Codeine Phosphate     Family History:   Family History  Problem Relation  Age of Onset  . Cancer Mother     unknown primary  . Other Father     spinal cord tumor  . Liver cancer Mother     unknown primary    Medications: Prior to Admission medications   Medication Sig Start Date End Date Taking? Authorizing Provider  amLODipine (NORVASC) 5 MG tablet Take 1 tablet by mouth  daily 11/06/15  Yes Marletta Lor, MD  diazepam (VALIUM) 2 MG tablet Take 1 tablet (2 mg total) by mouth at bedtime as needed. Patient taking differently: Take 2 mg by mouth at bedtime as needed for anxiety or muscle spasms.  09/26/15  Yes Marletta Lor, MD  losartan-hydrochlorothiazide [facility]) 100-25 MG tablet TAKE 1 TABLET BY MOUTH  DAILY 02/11/16  Yes Marletta Lor, MD  naproxen (NAPROSYN) 500 MG tablet TAKE 1 TABLET BY MOUTH 2  TIMES DAILY WITH A MEAL. 03/08/15  Yes Marletta Lor, MD  omeprazole (PRILOSEC) 20 MG capsule Take 1 capsule by mouth  every day 11/06/15  Yes Marletta Lor, MD  PARoxetine (PAXIL) 30 MG tablet TAKE 2 TABLETS BY MOUTH  EVERY MORNING Patient taking differently: TAKE 1 TABLET BY MOUTH  EVERY MORNING 01/01/16  Yes Marletta Lor, MD  traMADol (ULTRAM) 50 MG tablet Take 1 tablet (50 mg total) by mouth every 6 (six) hours as needed. Patient taking differently: Take 50 mg by mouth every 6 (six) hours as needed for moderate pain or severe pain.  09/26/15  Yes Marletta Lor, MD  furosemide (LASIX) 40 MG tablet Take 1 tablet (40 mg total) by mouth daily. Patient taking differently: Take 40 mg by mouth daily as needed for fluid or edema.  06/07/14   Marletta Lor, MD  meclizine (ANTIVERT) 25 MG tablet Take 1 tablet (25 mg total) by mouth daily as needed. Patient taking differently: Take 25 mg by mouth 2 (two) times daily as needed for dizziness or nausea.  06/07/14   Marletta Lor, MD    Physical Exam: Patient Vitals for the past 24 hrs:  BP Temp Temp src Pulse Resp SpO2  04/05/16 0008 142/64 98 F (36.7 C) Oral 81 16 92 %    04/04/16 2328 127/68 - - 87 16 94 %  04/04/16 2257 127/68 - - 77 16 93 %  04/04/16 2226 127/69 - - 83 14 93 %  04/04/16 2139 135/70 - - 80 16 94 %  04/04/16 2101 135/73 - - 81 14 93 %  04/04/16 2026 130/78 98 F (36.7 C) Oral 84 16 91 %  04/04/16 1953 133/80 - - 82 12 91 %  04/04/16 1920 140/64 97.8 F (36.6 C) Oral 80 16 93 %  04/04/16 1811 141/78 - - 86 18 93 %  04/04/16 1640 168/86 - - 81 20 98 %  04/04/16 1634 - - - - - (!) 76 %    1. General:  in No Acute distress 2. Psychological: Alert and   Oriented 3. Head/ENT:   Moist   Mucous Membranes  Head Non traumatic, neck supple                           Poor Dentition 4. SKIN:   decreased Skin turgor,  Skin clean Dry and intact no rash 5. Heart: Regular rate and rhythm no  Murmur, Rub or gallop 6. Lungs:  distant no wheezes or crackles   7. Abdomen: Soft,  non-tender, Non distended, oese 8. Lower extremities: no clubbing, cyanosis, or edema 9. Neurologically Grossly intact, moving all 4 extremities equally   10. MSK: Normal range of motion   body mass index is unknown because there is no height or weight on file.  Labs on Admission:   Labs on Admission: I have personally reviewed following labs and imaging studies  CBC:  Recent Labs Lab 04/04/16 1805  WBC 10.1  NEUTROABS 7.4  HGB 12.8  HCT 42.3  MCV 92.2  PLT 123456   Basic Metabolic Panel: No results for input(s): NA, K, CL, CO2, GLUCOSE, BUN, CREATININE, CALCIUM, MG, PHOS in the last 168 hours. GFR: CrCl cannot be calculated (Patient's most recent lab result is older than the maximum 21 days allowed.). Liver Function Tests: No results for input(s): AST, ALT, ALKPHOS, BILITOT, PROT, ALBUMIN in the last 168 hours. No results for input(s): LIPASE, AMYLASE in the last 168 hours. No results for input(s): AMMONIA in the last 168 hours. Coagulation Profile: No results for input(s): INR, PROTIME in the last 168 hours. Cardiac  Enzymes:  Recent Labs Lab 04/04/16 1805  TROPONINI <0.03   BNP (last 3 results) No results for input(s): PROBNP in the last 8760 hours. HbA1C: No results for input(s): HGBA1C in the last 72 hours. CBG: No results for input(s): GLUCAP in the last 168 hours. Lipid Profile: No results for input(s): CHOL, HDL, LDLCALC, TRIG, CHOLHDL, LDLDIRECT in the last 72 hours. Thyroid Function Tests: No results for input(s): TSH, T4TOTAL, FREET4, T3FREE, THYROIDAB in the last 72 hours. Anemia Panel: No results for input(s): VITAMINB12, FOLATE, FERRITIN, TIBC, IRON, RETICCTPCT in the last 72 hours. Urine analysis:    Component Value Date/Time   COLORURINE YELLOW 04/04/2016 1805   APPEARANCEUR CLEAR 04/04/2016 1805   LABSPEC 1.013 04/04/2016 1805   PHURINE 8.0 04/04/2016 1805   GLUCOSEU NEGATIVE 04/04/2016 1805   HGBUR NEGATIVE 04/04/2016 1805   HGBUR negative 06/20/2008 0813   BILIRUBINUR NEGATIVE 04/04/2016 1805   BILIRUBINUR n 03/10/2016 1321   KETONESUR NEGATIVE 04/04/2016 1805   PROTEINUR NEGATIVE 04/04/2016 1805   UROBILINOGEN 1.0 03/10/2016 1321   UROBILINOGEN 0.2 06/20/2008 0813   NITRITE NEGATIVE 04/04/2016 1805   LEUKOCYTESUR TRACE (A) 04/04/2016 1805   Sepsis Labs: @LABRCNTIP (procalcitonin:4,lacticidven:4) )No results found for this or any previous visit (from the past 240 hour(s)).     UA  no evidence of UTI   No results found for: HGBA1C  CrCl cannot be calculated (Patient's most recent lab result is older than the maximum 21 days allowed.).  BNP (last 3 results) No results for input(s): PROBNP in the last 8760 hours.   ECG REPORT  Independently reviewed Rate: 90  Rhythm: Normal sinus rhythm ST&T Change: No acute ischemic changes  QTC 469  There were no vitals filed for this visit.   Cultures: No results found for: SDES, [place], CULT, REPTSTATUS   Radiological Exams on Admission: Dg Chest 2 View  Result Date: 04/04/2016 CLINICAL DATA:  Shortness of  breath. EXAM: CHEST  2 VIEW COMPARISON:  None. FINDINGS: The  heart size and mediastinal contours are within normal limits. No pneumothorax or pleural effusion is noted. Left lung is clear. Fluid is noted in the right minor fissure. No definite consolidative process is noted. The visualized skeletal structures are unremarkable. IMPRESSION: Fluid noted in right minor fissure. No other definite abnormality seen in the chest. Electronically Signed   By: Marijo Conception, M.D.   On: 04/04/2016 17:20   Ct Angio Chest Pe W And/or Wo Contrast  Result Date: 04/04/2016 CLINICAL DATA:  Headache, shortness of breath, dizziness. EXAM: CT ANGIOGRAPHY CHEST WITH CONTRAST TECHNIQUE: Multidetector CT imaging of the chest was performed using the standard protocol during bolus administration of intravenous contrast. Multiplanar CT image reconstructions and MIPs were obtained to evaluate the vascular anatomy. CONTRAST:  100 mL Isovue 370 COMPARISON:  12/05/2005 FINDINGS: Cardiovascular: Good opacification of the central and segmental pulmonary arteries. No focal filling defects. No evidence of significant pulmonary embolus. Mild cardiac enlargement. No pericardial effusion. Coronary artery calcifications. Normal caliber thoracic aorta with mild calcification. No aortic aneurysm or dissection. Mediastinum/Nodes: Prominent lymph nodes throughout the mediastinum, largest in the right paratracheal region measuring 12 mm short axis dimension. No change since prior study. Likely reactive. Thyroid gland is diffusely enlarged without focal nodularity. Esophagus is decompressed. Lungs/Pleura: Motion artifact limits evaluation of lungs. Linear areas of atelectasis in the lung bases. No focal consolidation. Patchy mosaic attenuation centrally may be due to motion artifact or could indicate edema. No pleural effusions. No pneumothorax. Upper Abdomen: Left lobe of the liver appears enlarged, possibly indicating cirrhosis. Liver is incompletely  included within the field of view and cannot be well characterized. Musculoskeletal: Mild degenerative changes in the spine. No destructive bone lesions. Review of the MIP images confirms the above findings. IMPRESSION: No evidence of significant pulmonary embolus. Cardiac enlargement. Mild mosaic attenuation pattern to the lungs could be due to motion artifact or may indicate edema. No pleural effusions. Atelectasis in the lung bases. Electronically Signed   By: Lucienne Capers M.D.   On: 04/04/2016 23:21    Chart has been reviewed    Assessment/Plan  66 y.o. female with medical history significant of HTN, GERD, depression  Morbid obesity being admitted for acute respiratory failure with hypoxia and hypercarbia   Present on Admission:   . Acute respiratory failure with hypoxia and hypercarbia (HCC) discussed with [facility] M was seen in consult in the morning most likely secondary to chronic condition suspect COPD versus hypoventilation syndrome. Will make sure patient has nebulizer. Available we'll obtain echogram monitor on telemetry overnight for any signs of dysrhythmia. Obtain electrolytes panel checked bicarbonate level. No evidence of somnolence and well compensated unlikely to be acute hypercarbia Hypoxia most likely acute on chronic patient may need home oxygen  . Essential hypertension stable continue home medications . GERD stable continue home medications . Morbid obesity ([place]) we'll need dietitian consult prior to discharge Other plan as per orders.  DVT prophylaxis:    Lovenox     Code Status:  FULL CODE   as per patient    Family Communication:   Family not  at  Bedside   Disposition Plan:     To home once workup is complete and patient is stable                             Consults called: Pulmonology   Admission status:   obs   Level of care  tele           I have spent a total of 56 min on this admission   extra time was spent to discuss case with  PCCM  [place] 04/05/2016, 1:59 AM    [facility]  Pager [phone]   after 2 AM please page floor coverage PA If 7AM-7PM, please contact the day team taking care of the patient  [email or web address]  Password [email or web address]

## 2016-04-05 NOTE — Progress Notes (Signed)
I assumed care of this patient at 1500.  I agree with the previous nurses assessment.  

## 2016-04-05 NOTE — Progress Notes (Signed)
Rx Brief note:  Lovenox Wt=137 kg, BMI=53  Rx adjusted Lovenox to 60 mg sq daily in pt with BMI>30  Thanks Keylly, Jahnke 04/05/2016 3:10 AM

## 2016-04-05 NOTE — Progress Notes (Signed)
  Echocardiogram 2D Echocardiogram has been performed.  Johny Chess 04/05/2016, 11:26 AM

## 2016-04-05 NOTE — ED Provider Notes (Signed)
[place] DEPT Provider Note   CSN: [id number] Arrival date & time: 04/04/16  1626     History   Chief Complaint Chief Complaint  Patient presents with  . Shortness of Breath    hypoxia    HPI Laurie Dominguez is a 66 y.o. female.  HPI  Pt comes in with cc of DIB. Pt has hx of HTN, obesity. Pt reports that she has hx of exertional dyspnea, however, the past few days she has been having dib even when she is not exerting herself. Pt sae her pcp today and was sent to the ER for hypoxia, and pt was noted to have 78% O2 sats on room air. Pt has no hx of PE, DVT and denies any exogenous estrogen use, long distance travels or surgery in the past 6 weeks, active cancer, recent immobilization. Pt has no hx of CHF, denies any new swelling in the leg or orthopnea. Pt has no chest pain.   Past Medical History:  Diagnosis Date  . Colon polyp 08/16/2004   Hyperplastic  . Depression   . GERD (gastroesophageal reflux disease)   . Hypertension   . Nephrolithiasis   . Obesity     Patient Active Problem List   Diagnosis Date Noted  . Hypoxia 04/05/2016  . Acute respiratory failure with hypoxia ([place]) 04/05/2016  . Impaired glucose tolerance 04/11/2014  . Morbid obesity ([place]) 04/11/2014  . DEPRESSION 12/21/2006  . Essential hypertension 12/21/2006  . GERD 12/21/2006  . NEPHROLITHIASIS, HX OF 12/21/2006    Past Surgical History:  Procedure Laterality Date  . CHOLECYSTECTOMY    . DILATION AND CURETTAGE OF UTERUS    . TONSILLECTOMY      OB History    No data available       Home Medications    Prior to Admission medications   Medication Sig Start Date End Date Taking? Authorizing Provider  amLODipine (NORVASC) 5 MG tablet Take 1 tablet by mouth  daily 11/06/15  Yes Marletta Lor, MD  diazepam (VALIUM) 2 MG tablet Take 1 tablet (2 mg total) by mouth at bedtime as needed. Patient taking differently: Take 2 mg by mouth at bedtime as needed for anxiety or muscle  spasms.  09/26/15  Yes Marletta Lor, MD  losartan-hydrochlorothiazide [facility]) 100-25 MG tablet TAKE 1 TABLET BY MOUTH  DAILY 02/11/16  Yes Marletta Lor, MD  naproxen (NAPROSYN) 500 MG tablet TAKE 1 TABLET BY MOUTH 2  TIMES DAILY WITH A MEAL. 03/08/15  Yes Marletta Lor, MD  omeprazole (PRILOSEC) 20 MG capsule Take 1 capsule by mouth  every day 11/06/15  Yes Marletta Lor, MD  PARoxetine (PAXIL) 30 MG tablet TAKE 2 TABLETS BY MOUTH  EVERY MORNING Patient taking differently: TAKE 1 TABLET BY MOUTH  EVERY MORNING 01/01/16  Yes Marletta Lor, MD  traMADol (ULTRAM) 50 MG tablet Take 1 tablet (50 mg total) by mouth every 6 (six) hours as needed. Patient taking differently: Take 50 mg by mouth every 6 (six) hours as needed for moderate pain or severe pain.  09/26/15  Yes Marletta Lor, MD  furosemide (LASIX) 40 MG tablet Take 1 tablet (40 mg total) by mouth daily. Patient taking differently: Take 40 mg by mouth daily as needed for fluid or edema.  06/07/14   Marletta Lor, MD  meclizine (ANTIVERT) 25 MG tablet Take 1 tablet (25 mg total) by mouth daily as needed. Patient taking differently: Take 25 mg by mouth 2 (two)  times daily as needed for dizziness or nausea.  06/07/14   Marletta Lor, MD    Family History Family History  Problem Relation Age of Onset  . Cancer Mother     unknown primary  . Other Father     spinal cord tumor  . Liver cancer Mother     unknown primary    Social History Social History  Substance Use Topics  . Smoking status: Former Smoker    Quit date: 03/24/2006  . Smokeless tobacco: Never Used  . Alcohol use No     Allergies   Codeine phosphate   Review of Systems Review of Systems  ROS 10 Systems reviewed and are negative for acute change except as noted in the HPI.     Physical Exam Updated Vital Signs BP 102/56 (BP Location: Left Arm)   Pulse 71   Temp 98 F (36.7 C) (Oral)   Resp 11   SpO2 90%    Physical Exam  Constitutional: She is oriented to person, place, and time. She appears well-developed and well-nourished.  HENT:  Head: Normocephalic and atraumatic.  Eyes: EOM are normal. Pupils are equal, round, and reactive to light.  Neck: Neck supple.  Cardiovascular: Normal rate, regular rhythm and normal heart sounds.   No murmur heard. Pulmonary/Chest: Effort normal. No respiratory distress. She has no wheezes. She has no rales.  Abdominal: Soft. She exhibits no distension. There is no tenderness. There is no rebound and no guarding.  Neurological: She is alert and oriented to person, place, and time.  Skin: Skin is warm and dry.  Nursing note and vitals reviewed.    ED Treatments / Results  Labs (all labs ordered are listed, but only abnormal results are displayed) Labs Reviewed  URINALYSIS, ROUTINE W REFLEX MICROSCOPIC - Abnormal; Notable for the following:       Result Value   Leukocytes, UA TRACE (*)    Squamous Epithelial / LPF 0-5 (*)    All other components within normal limits  CBC WITH DIFFERENTIAL/PLATELET  TROPONIN I  BRAIN NATRIURETIC PEPTIDE  BLOOD GAS, ARTERIAL  I-STAT CHEM 8, ED    EKG  EKG Interpretation  Date/Time:  Friday April 04 2016 16:36:57 EST Ventricular Rate:  90 PR Interval:    QRS Duration: 93 QT Interval:  383 QTC Calculation: 469 R Axis:   63 Text Interpretation:  Sinus rhythm Low voltage, precordial leads RSR' in V1 or V2, right VCD or RVH No significant change since last tracing Confirmed by Kathrynn Humble, MD, Thelma Comp [phone]) on 04/04/2016 4:51:48 PM       Radiology Dg Chest 2 View  Result Date: 04/04/2016 CLINICAL DATA:  Shortness of breath. EXAM: CHEST  2 VIEW COMPARISON:  None. FINDINGS: The heart size and mediastinal contours are within normal limits. No pneumothorax or pleural effusion is noted. Left lung is clear. Fluid is noted in the right minor fissure. No definite consolidative process is noted. The visualized skeletal  structures are unremarkable. IMPRESSION: Fluid noted in right minor fissure. No other definite abnormality seen in the chest. Electronically Signed   By: Marijo Conception, M.D.   On: 04/04/2016 17:20   Ct Angio Chest Pe W And/or Wo Contrast  Result Date: 04/04/2016 CLINICAL DATA:  Headache, shortness of breath, dizziness. EXAM: CT ANGIOGRAPHY CHEST WITH CONTRAST TECHNIQUE: Multidetector CT imaging of the chest was performed using the standard protocol during bolus administration of intravenous contrast. Multiplanar CT image reconstructions and MIPs were obtained to evaluate the  vascular anatomy. CONTRAST:  100 mL Isovue 370 COMPARISON:  12/05/2005 FINDINGS: Cardiovascular: Good opacification of the central and segmental pulmonary arteries. No focal filling defects. No evidence of significant pulmonary embolus. Mild cardiac enlargement. No pericardial effusion. Coronary artery calcifications. Normal caliber thoracic aorta with mild calcification. No aortic aneurysm or dissection. Mediastinum/Nodes: Prominent lymph nodes throughout the mediastinum, largest in the right paratracheal region measuring 12 mm short axis dimension. No change since prior study. Likely reactive. Thyroid gland is diffusely enlarged without focal nodularity. Esophagus is decompressed. Lungs/Pleura: Motion artifact limits evaluation of lungs. Linear areas of atelectasis in the lung bases. No focal consolidation. Patchy mosaic attenuation centrally may be due to motion artifact or could indicate edema. No pleural effusions. No pneumothorax. Upper Abdomen: Left lobe of the liver appears enlarged, possibly indicating cirrhosis. Liver is incompletely included within the field of view and cannot be well characterized. Musculoskeletal: Mild degenerative changes in the spine. No destructive bone lesions. Review of the MIP images confirms the above findings. IMPRESSION: No evidence of significant pulmonary embolus. Cardiac enlargement. Mild mosaic  attenuation pattern to the lungs could be due to motion artifact or may indicate edema. No pleural effusions. Atelectasis in the lung bases. Electronically Signed   By: Lucienne Capers M.D.   On: 04/04/2016 23:21    Procedures Procedures (including critical care time)  CRITICAL CARE Performed by: Varney Biles   Total critical care time: 38 minutes  Critical care time was exclusive of separately billable procedures and treating other patients.  Critical care was necessary to treat or prevent imminent or life-threatening deterioration.  Critical care was time spent personally by me on the following activities: development of treatment plan with patient and/or surrogate as well as nursing, discussions with consultants, evaluation of patient's response to treatment, examination of patient, obtaining history from patient or surrogate, ordering and performing treatments and interventions, ordering and review of laboratory studies, ordering and review of radiographic studies, pulse oximetry and re-evaluation of patient's condition.  Medications Ordered in ED Medications  iopamidol (ISOVUE-370) 76 % injection (not administered)  ipratropium-albuterol (DUONEB) 0.5-2.5 (3) MG/3ML nebulizer solution 3 mL (3 mLs Nebulization Given 04/04/16 1737)  iopamidol (ISOVUE-370) 76 % injection 100 mL (100 mLs Intravenous Contrast Given 04/04/16 2303)     Initial Impression / Assessment and Plan / ED Course  I have reviewed the triage vital signs and the nursing notes.  Pertinent labs & imaging results that were available during my care of the patient were reviewed by me and considered in my medical decision making (see chart for details).  Clinical Course     Pt comes in with cc of DIB and noted to be hypoxic. She has no lung dz, no heart dz. Pt is noted to be quite hypoxic at arrival. [place], BNP normal - so we did a CT PE, which is also normal. Pt's O2 sats drop to 85% on room air. We will add a  influenza screen and admit to medicine.  Final Clinical Impressions(s) / ED Diagnoses   Final diagnoses:  Acute respiratory failure with hypoxia [facility])    New Prescriptions New Prescriptions   No medications on file     Varney Biles, MD 04/05/16 [phone]

## 2016-04-05 NOTE — Consult Note (Signed)
Name: Laurie Dominguez MRN: [id number] DOB: 1950-08-20    ADMISSION DATE:  04/04/2016 CONSULTATION DATE:  04/06/15  REFERRING MD :  Dr Sheran Fava [facility]  CHIEF COMPLAINT:  SOB  SIGNIFICANT EVENTS    STUDIES:  CTa 04/04/16-  ECHO 04/05/16   HISTORY OF PRESENT ILLNESS:  66 yo F morbidly obese former smoker with medical hx HBP, GERD, depression. Presented to PCP 1/12 with 1 week hx increased dyspnea, malaise, mild headache. Denies fever, cough, myalgias, chest pain, edema, palpitation, leg pain. Had flu shot. Denies other cardiopulmonary disease.Did question early UTI with slight dysuria. She had hurt her R leg recently trying to get out of bathtub, but indicates that resolved. CTa chest (reviewed by me) shows mosaic density suggestive of edema or pneumonitis. No PE or pneumonia.  PAST MEDICAL HISTORY :   has a past medical history of Colon polyp (08/16/2004); Depression; GERD (gastroesophageal reflux disease); Hypertension; Nephrolithiasis; and Obesity.  has a past surgical history that includes Cholecystectomy; Tonsillectomy; and Dilation and curettage of uterus. Prior to Admission medications   Medication Sig Start Date End Date Taking? Authorizing Provider  amLODipine (NORVASC) 5 MG tablet Take 1 tablet by mouth  daily 11/06/15  Yes Marletta Lor, MD  diazepam (VALIUM) 2 MG tablet Take 1 tablet (2 mg total) by mouth at bedtime as needed. Patient taking differently: Take 2 mg by mouth at bedtime as needed for anxiety or muscle spasms.  09/26/15  Yes Marletta Lor, MD  losartan-hydrochlorothiazide [facility]) 100-25 MG tablet TAKE 1 TABLET BY MOUTH  DAILY 02/11/16  Yes Marletta Lor, MD  naproxen (NAPROSYN) 500 MG tablet TAKE 1 TABLET BY MOUTH 2  TIMES DAILY WITH A MEAL. 03/08/15  Yes Marletta Lor, MD  omeprazole (PRILOSEC) 20 MG capsule Take 1 capsule by mouth  every day 11/06/15  Yes Marletta Lor, MD  PARoxetine (PAXIL) 30 MG tablet TAKE 2 TABLETS BY MOUTH  EVERY  MORNING Patient taking differently: TAKE 1 TABLET BY MOUTH  EVERY MORNING 01/01/16  Yes Marletta Lor, MD  traMADol (ULTRAM) 50 MG tablet Take 1 tablet (50 mg total) by mouth every 6 (six) hours as needed. Patient taking differently: Take 50 mg by mouth every 6 (six) hours as needed for moderate pain or severe pain.  09/26/15  Yes Marletta Lor, MD  furosemide (LASIX) 40 MG tablet Take 1 tablet (40 mg total) by mouth daily. Patient taking differently: Take 40 mg by mouth daily as needed for fluid or edema.  06/07/14   Marletta Lor, MD  meclizine (ANTIVERT) 25 MG tablet Take 1 tablet (25 mg total) by mouth daily as needed. Patient taking differently: Take 25 mg by mouth 2 (two) times daily as needed for dizziness or nausea.  06/07/14   Marletta Lor, MD   Allergies  Allergen Reactions  . Codeine Phosphate     FAMILY HISTORY:  family history includes Cancer in her mother; Liver cancer in her mother; Other in her father. SOCIAL HISTORY:  reports that she quit smoking about 10 years ago. She has never used smokeless tobacco. She reports that she does not drink alcohol or use drugs.   ROS-see HPI   Pos ="+" Constitutional:    weight loss, night sweats, fevers, chills, fatigue, lassitude. HEENT:   + headaches, difficulty swallowing, tooth/dental problems, sore throat,       sneezing, itching, ear ache, nasal congestion, post nasal drip, snoring CV:    chest pain, orthopnea, PND, swelling  in lower extremities, anasarca,                                                 dizziness, palpitations Resp:  + shortness of breath with exertion or at rest.                productive cough,   non-productive cough, coughing up of blood.              change in color of mucus.  wheezing.   Skin:    rash or lesions. GI:  No-   heartburn, indigestion, abdominal pain, nausea, vomiting, diarrhea,                 change in bowel habits, loss of appetite GU: +dysuria, change in color of urine,  no urgency or frequency.   flank pain. MS:   joint pain, stiffness, decreased range of motion, back pain. Neuro-     nothing unusual Psych:  change in mood or affect.  depression or anxiety.   memory loss.  SUBJECTIVE:   VITAL SIGNS: Temp:  [97.8 F (36.6 C)-98.1 F (36.7 C)] 97.8 F (36.6 C) (01/13 0325) Pulse Rate:  [71-99] 88 (01/13 0325) Resp:  [11-20] 18 (01/13 0325) BP: (102-168)/(49-86) 111/49 (01/13 0325) SpO2:  [76 %-98 %] 92 % (01/13 1158) FiO2 (%):  [28 %] 28 % (01/13 1158) Weight:  [137 kg (302 lb)-137 kg (302 lb 0.5 oz)] 137 kg (302 lb 0.5 oz) (01/13 0325)  PHYSICAL EXAMINATION: General:  Pleasant, morbidly obese F, alert and cooperative, lying in bed, talking with visitor Neuro:  Oriented, non-focal HEENT:  Mucosa clear, no stridor or JVD Cardiovascular: RRR, no m/g/r, no edema Lungs:  Shallow in bases, few crackles in bases Abdomen:  Obese, protuberant, soft, nl BS Musculoskeletal:  Moves all extremities Skin:  Areas of eczema or possibly psoriasis on legs  Recent Labs Lab 04/05/16 0240  NA 139  K 3.7  CL 98*  CO2 34*  BUN 14  CREATININE 0.68  GLUCOSE 112*    Recent Labs Lab 04/04/16 1805  HGB 12.8  HCT 42.3  WBC 10.1  PLT 315   Dg Chest 2 View  Result Date: 04/04/2016 CLINICAL DATA:  Shortness of breath. EXAM: CHEST  2 VIEW COMPARISON:  None. FINDINGS: The heart size and mediastinal contours are within normal limits. No pneumothorax or pleural effusion is noted. Left lung is clear. Fluid is noted in the right minor fissure. No definite consolidative process is noted. The visualized skeletal structures are unremarkable. IMPRESSION: Fluid noted in right minor fissure. No other definite abnormality seen in the chest. Electronically Signed   By: Marijo Conception, M.D.   On: 04/04/2016 17:20   Ct Angio Chest Pe W And/or Wo Contrast  Result Date: 04/04/2016 CLINICAL DATA:  Headache, shortness of breath, dizziness. EXAM: CT ANGIOGRAPHY CHEST WITH  CONTRAST TECHNIQUE: Multidetector CT imaging of the chest was performed using the standard protocol during bolus administration of intravenous contrast. Multiplanar CT image reconstructions and MIPs were obtained to evaluate the vascular anatomy. CONTRAST:  100 mL Isovue 370 COMPARISON:  12/05/2005 FINDINGS: Cardiovascular: Good opacification of the central and segmental pulmonary arteries. No focal filling defects. No evidence of significant pulmonary embolus. Mild cardiac enlargement. No pericardial effusion. Coronary artery calcifications. Normal caliber thoracic aorta with mild calcification. No aortic aneurysm  or dissection. Mediastinum/Nodes: Prominent lymph nodes throughout the mediastinum, largest in the right paratracheal region measuring 12 mm short axis dimension. No change since prior study. Likely reactive. Thyroid gland is diffusely enlarged without focal nodularity. Esophagus is decompressed. Lungs/Pleura: Motion artifact limits evaluation of lungs. Linear areas of atelectasis in the lung bases. No focal consolidation. Patchy mosaic attenuation centrally may be due to motion artifact or could indicate edema. No pleural effusions. No pneumothorax. Upper Abdomen: Left lobe of the liver appears enlarged, possibly indicating cirrhosis. Liver is incompletely included within the field of view and cannot be well characterized. Musculoskeletal: Mild degenerative changes in the spine. No destructive bone lesions. Review of the MIP images confirms the above findings. IMPRESSION: No evidence of significant pulmonary embolus. Cardiac enlargement. Mild mosaic attenuation pattern to the lungs could be due to motion artifact or may indicate edema. No pleural effusions. Atelectasis in the lung bases. Electronically Signed   By: Lucienne Capers M.D.   On: 04/04/2016 23:21    ASSESSMENT / PLAN:  Acute respiratory failure with hypoxia and hypercarbia-  Subtle worsening of baseline exercise limit.  Probably mild  COPD and chronic obesity- hypoventilation with acute exacerbation from recent very cold weather, viral respiratory infection, perhaps mild cardiogenic edema. CT excludes significant PE. ABG in ER on 2L O2  PH 7.35, PCO2 65.5, PO2 61.6, HCO3 8.3- most consistent with chronic CO2 retention She is anxious to get back home. P-  Await results of ECHO. Encourage mobilization.  Gentle diuresis Continue nebulizers Recommend outpatient pulmonary office f/u to get PFT and possibly sleep study  Keep O2 90-94%  CD Young, MD Pulmonary and [place] Pager: [phone]  04/05/2016, 1:45 PM

## 2016-04-05 NOTE — Care Management Obs Status (Signed)
[facility] NOTIFICATION   Patient Details  Name: Laurie Dominguez MRN: [id number] Date of Birth: 04/07/1950   Medicare Observation Status Notification Given:  Yes    Dellie Catholic, RN 04/05/2016, 5:42 PM

## 2016-04-06 DIAGNOSIS — I10 Essential (primary) hypertension: Secondary | ICD-10-CM | POA: Diagnosis not present

## 2016-04-06 DIAGNOSIS — K219 Gastro-esophageal reflux disease without esophagitis: Secondary | ICD-10-CM | POA: Diagnosis not present

## 2016-04-06 DIAGNOSIS — R0902 Hypoxemia: Secondary | ICD-10-CM | POA: Diagnosis not present

## 2016-04-06 DIAGNOSIS — J9601 Acute respiratory failure with hypoxia: Secondary | ICD-10-CM | POA: Diagnosis not present

## 2016-04-06 LAB — COMPREHENSIVE METABOLIC PANEL
ALBUMIN: 3.5 g/dL (ref 3.5–5.0)
ALK PHOS: 99 U/L (ref 38–126)
ALT: 11 U/L — AB (ref 14–54)
ANION GAP: 9 (ref 5–15)
AST: 23 U/L (ref 15–41)
BUN: 15 mg/dL (ref 6–20)
CALCIUM: 8.6 mg/dL — AB (ref 8.9–10.3)
CHLORIDE: 98 mmol/L — AB (ref 101–111)
CO2: 34 mmol/L — AB (ref 22–32)
Creatinine, Ser: 0.63 mg/dL (ref 0.44–1.00)
GFR calc Af Amer: >60 mL/min (ref >60)
GFR calc non Af Amer: >60 mL/min (ref >60)
GLUCOSE: 116 mg/dL — AB (ref 65–99)
Potassium: 4 mmol/L (ref 3.5–5.1)
Sodium: 141 mmol/L (ref 135–145)
Total Bilirubin: 1.4 mg/dL — ABNORMAL HIGH (ref 0.3–1.2)
Total Protein: 7.2 g/dL (ref 6.5–8.1)

## 2016-04-06 LAB — HEMOGLOBIN A1C
Hgb A1c MFr Bld: 6.1 % — ABNORMAL HIGH (ref 4.8–5.6)
Mean Plasma Glucose: 128 mg/dL

## 2016-04-06 LAB — CBC
HCT: 42.6 % (ref 36.0–46.0)
Hemoglobin: 12.8 g/dL (ref 12.0–15.0)
MCH: 28.2 pg (ref 26.0–34.0)
MCHC: 30 g/dL (ref 30.0–36.0)
MCV: 93.8 fL (ref 78.0–100.0)
PLATELETS: 321 10*3/uL (ref 150–400)
RBC: 4.54 MIL/uL (ref 3.87–5.11)
RDW: 15.4 % (ref 11.5–15.5)
WBC: 8.8 10*3/uL (ref 4.0–10.5)

## 2016-04-06 MED ORDER — FUROSEMIDE 20 MG PO TABS
20.0000 mg | ORAL_TABLET | Freq: Every day | ORAL | Status: DC
Start: 2016-04-06 — End: 2016-04-07
  Administered 2016-04-06 – 2016-04-07 (×2): 20 mg via ORAL
  Filled 2016-04-06 (×2): qty 1

## 2016-04-06 NOTE — Discharge Summary (Signed)
Physician Discharge Summary  Laurie Dominguez [id number] DOB: Jun 26, 1950 DOA: 04/04/2016  PCP: Nyoka Cowden, MD  Admit date: 04/04/2016 Discharge date: 04/07/2016  Admitted From: Home Disposition:  Home  Recommendations for Outpatient Follow-up:  1. Follow up with PCP in 1-2 weeks 2. Follow up with Pulmonology in 2 weeks- please get PFT's and Sleep Study test 3. Please obtain BMP/CBC in one week 4. Use oxygen as prescribed 5. Continue use of new inhalers   Home Health: No Equipment/Devices: oxygen at 2L [place] at baseline with 4L [name] during ambulation  Discharge Condition: Stable CODE STATUS: Full Code Diet recommendation: Heart Healthy   Brief/Interim Summary: Laurie Dominguez a 66 y.o.femalewith medical history significant of HTN, GERD, depression and morbid obesity.  Presented with shortness of breath and dyspnea on exertion worse for the past week and a half. , Reports dyspnea on exertion been going on for the past 1 year she attributed to excessive weight she has no associated chest pain or leg edema but does take Lasix sometimes. Reports have had occasional ankle swelling. She used to smoke but not now, lately have had a runny nose denies cough, fever or wheezing. 1 Week ago she had burning with urination. She went to PCP and was told her Ua was negative. She has not been moving much, she has a lot of sinus drainage.  IN [id number] (24hrs), Avg:98 F (36.7 C), Min:97.8 F (36.6 C), Max:98.1 F (36.7 C)R 16 oxygen saturation 76% on room air up to 92% on 5 L HR 84 WBC 10.1 hemoglobin 12.8 Troponin less than 0.03 BNP 20.4 CT and her chest no evidence of PE and the base is noted prominent lymph nodes throughout the mediastinum likely reactive could not rule out edema no pleural effusions  Patient was maintained on supplemental oxygen during her hospitalization- requiring up to 6L during ambulation on 04/06/16.  She was titrated down and started on low dose lasix.  She  was encouraged to follow up with pulmonology outpatient and use albuterol and symbicort inhalers as prescribed.  She understands she will likely need a sleep study and lung function test outpatient.  Discharge Diagnoses:  Active Problems:   Essential hypertension   GERD   Morbid obesity ([place])   Hypoxia   Acute respiratory failure with hypoxia [facility])    Discharge Instructions  Discharge Instructions    Call MD for:  difficulty breathing, headache or visual disturbances    Complete by:  As directed    Call MD for:  extreme fatigue    Complete by:  As directed    Call MD for:  hives    Complete by:  As directed    Call MD for:  persistant dizziness or light-headedness    Complete by:  As directed    Call MD for:  persistant nausea and vomiting    Complete by:  As directed    Call MD for:  severe uncontrolled pain    Complete by:  As directed    Call MD for:  temperature >100.4    Complete by:  As directed    Diet - low sodium heart healthy    Complete by:  As directed    Discharge instructions    Complete by:  As directed    Take prescriptions as prescribed Follow up with your PCP and discuss oxygen need, sleep study, lung function test Follow up with Pulmonology in 2 weeks Discuss referral to nutritionist/ dietician   Increase activity slowly    Complete  by:  As directed      Allergies as of 04/07/2016      Reactions   Codeine Phosphate       Medication List    TAKE these medications   albuterol 108 (90 Base) MCG/ACT inhaler Commonly known as:  PROVENTIL HFA;VENTOLIN HFA Inhale 2 puffs into the lungs every 4 (four) hours as needed for wheezing or shortness of breath.   amLODipine 5 MG tablet Commonly known as:  NORVASC Take 1 tablet by mouth  daily   budesonide-formoterol 160-4.5 MCG/ACT inhaler Commonly known as:  SYMBICORT Inhale 2 puffs into the lungs 2 (two) times daily.   diazepam 2 MG tablet Commonly known as:  VALIUM Take 1 tablet (2 mg total) by  mouth at bedtime as needed. What changed:  reasons to take this   fluticasone 50 MCG/ACT nasal spray Commonly known as:  FLONASE Place 2 sprays into both nostrils daily.   furosemide 20 MG tablet Commonly known as:  LASIX Take 1 tablet (20 mg total) by mouth daily. What changed:  medication strength  how much to take   guaiFENesin 600 MG 12 hr tablet Commonly known as:  MUCINEX Take 1 tablet (600 mg total) by mouth 2 (two) times daily.   losartan-hydrochlorothiazide 100-25 MG tablet Commonly known as:  HYZAAR TAKE 1 TABLET BY MOUTH  DAILY   meclizine 25 MG tablet Commonly known as:  ANTIVERT Take 1 tablet (25 mg total) by mouth daily as needed. What changed:  when to take this  reasons to take this   naproxen 500 MG tablet Commonly known as:  NAPROSYN TAKE 1 TABLET BY MOUTH 2  TIMES DAILY WITH A MEAL.   omeprazole 20 MG capsule Commonly known as:  PRILOSEC Take 1 capsule by mouth  every day   PARoxetine 30 MG tablet Commonly known as:  PAXIL Take 1 tablet (30 mg total) by mouth every morning. What changed:  See the new instructions.   traMADol 50 MG tablet Commonly known as:  ULTRAM Take 1 tablet (50 mg total) by mouth every 6 (six) hours as needed. What changed:  reasons to take this            Durable Medical Equipment        Start     Ordered   04/07/16 1255  For home use only DME oxygen  Once    Question Answer Comment  Mode or (Route) Nasal cannula   Liters per Minute 5   Frequency Continuous (stationary and portable oxygen unit needed)   Oxygen conserving device Yes   Oxygen delivery system Gas      04/07/16 1254     Follow-up Information    [facility]. Schedule an appointment as soon as possible for a visit in 1 week(s).   Specialty:  Pulmonology Contact information: [address]       Nyoka Cowden, MD. Schedule an appointment as soon as possible for a  visit in 1 week(s).   Specialty:  Internal Medicine Contact information: [place] [address] [phone]          Allergies  Allergen Reactions  . Codeine Phosphate     Consultations:  Pulmonology  Case management   Procedures/Studies: Dg Chest 2 View  Result Date: 04/04/2016 CLINICAL DATA:  Shortness of breath. EXAM: CHEST  2 VIEW COMPARISON:  None. FINDINGS: The heart size and mediastinal contours are within normal limits. No pneumothorax  or pleural effusion is noted. Left lung is clear. Fluid is noted in the right minor fissure. No definite consolidative process is noted. The visualized skeletal structures are unremarkable. IMPRESSION: Fluid noted in right minor fissure. No other definite abnormality seen in the chest. Electronically Signed   By: Marijo Conception, M.D.   On: 04/04/2016 17:20   Ct Angio Chest Pe W And/or Wo Contrast  Result Date: 04/04/2016 CLINICAL DATA:  Headache, shortness of breath, dizziness. EXAM: CT ANGIOGRAPHY CHEST WITH CONTRAST TECHNIQUE: Multidetector CT imaging of the chest was performed using the standard protocol during bolus administration of intravenous contrast. Multiplanar CT image reconstructions and MIPs were obtained to evaluate the vascular anatomy. CONTRAST:  100 mL Isovue 370 COMPARISON:  12/05/2005 FINDINGS: Cardiovascular: Good opacification of the central and segmental pulmonary arteries. No focal filling defects. No evidence of significant pulmonary embolus. Mild cardiac enlargement. No pericardial effusion. Coronary artery calcifications. Normal caliber thoracic aorta with mild calcification. No aortic aneurysm or dissection. Mediastinum/Nodes: Prominent lymph nodes throughout the mediastinum, largest in the right paratracheal region measuring 12 mm short axis dimension. No change since prior study. Likely reactive. Thyroid gland is diffusely enlarged without focal nodularity. Esophagus is decompressed.  Lungs/Pleura: Motion artifact limits evaluation of lungs. Linear areas of atelectasis in the lung bases. No focal consolidation. Patchy mosaic attenuation centrally may be due to motion artifact or could indicate edema. No pleural effusions. No pneumothorax. Upper Abdomen: Left lobe of the liver appears enlarged, possibly indicating cirrhosis. Liver is incompletely included within the field of view and cannot be well characterized. Musculoskeletal: Mild degenerative changes in the spine. No destructive bone lesions. Review of the MIP images confirms the above findings. IMPRESSION: No evidence of significant pulmonary embolus. Cardiac enlargement. Mild mosaic attenuation pattern to the lungs could be due to motion artifact or may indicate edema. No pleural effusions. Atelectasis in the lung bases. Electronically Signed   By: Lucienne Capers M.D.   On: 04/04/2016 23:21   Echocardiogram: Left ventricle: The cavity size was normal. Wall thickness was increased in a pattern of mild LVH. Systolic function was vigorous. The estimated ejection fraction was in the range of 65% to 70%. Wall motion was normal; there were no regional wall motion abnormalities. Doppler parameters are consistent with abnormal left ventricular relaxation (grade 1 diastolic dysfunction). Aortic valve: Valve area (VTI): 3.05 cm^2. Valve area (Vmax):   2.58 cm^2. Valve area (Vmean): 2.63 cm^2. Left atrium: The atrium was mildly dilated  Subjective: Patient seen and examined.  Prior to visit this morning she was able to maintain saturations on room air but with ambulation patient oxygen dropped to mid 70s and then after 5 minutes would increase to 90%.  Patient feeling good and voices she is hopeful she can get home PT/RN at home to help with her transition to home.  Discharge Exam: Vitals:   04/07/16 0420 04/07/16 1403  BP: 128/75 (!) 148/75  Pulse: 81 98  Resp: 18 18  Temp: 97.6 F (36.4 C) 97.8 F (36.6 C)   Vitals:   04/07/16  0420 04/07/16 0814 04/07/16 1149 04/07/16 1403  BP: 128/75   (!) 148/75  Pulse: 81   98  Resp: 18   18  Temp: 97.6 F (36.4 C)   97.8 F (36.6 C)  TempSrc: Oral   Oral  SpO2: 97% 95% 97% 94%  Weight: (!) 136.5 kg (300 lb 14.9 oz)     Height:        General: Pt  is alert, awake, not in acute distress Cardiovascular: RRR, S1/S2 +, no rubs, no gallops Respiratory: moderate air movement, no increased work of breathing, no accessory muscle use, few fine crackles at bases Abdominal: Soft, NT, ND, bowel sounds + Extremities: no edema, no cyanosis    The results of significant diagnostics from this hospitalization (including imaging, microbiology, ancillary and laboratory) are listed below for reference.     Microbiology: No results found for this or any previous visit (from the past 240 hour(s)).   Labs: BNP (last 3 results)  Recent Labs  04/04/16 1805  BNP [id number]   Basic Metabolic Panel:  Recent Labs Lab 04/05/16 0240 04/05/16 0523 04/06/16 0517  NA 139  --  141  K 3.7  --  4.0  CL 98*  --  98*  CO2 34*  --  34*  GLUCOSE 112*  --  116*  BUN 14  --  15  CREATININE 0.68  --  0.63  CALCIUM 8.6*  --  8.6*  MG  --  2.0  --   PHOS  --  4.0  --    Liver Function Tests:  Recent Labs Lab 04/05/16 0240 04/06/16 0517  AST 15 23  ALT 12* 11*  ALKPHOS 106 99  BILITOT 0.6 1.4*  PROT 6.9 7.2  ALBUMIN 3.4* 3.5   No results for input(s): LIPASE, AMYLASE in the last 168 hours. No results for input(s): AMMONIA in the last 168 hours. CBC:  Recent Labs Lab 04/04/16 1805 04/06/16 0517  WBC 10.1 8.8  NEUTROABS 7.4  --   HGB 12.8 12.8  HCT 42.3 42.6  MCV 92.2 93.8  PLT 315 321   Cardiac Enzymes:  Recent Labs Lab 04/04/16 1805  TROPONINI <0.03   BNP: Invalid input(s): POCBNP CBG: No results for input(s): GLUCAP in the last 168 hours. D-Dimer No results for input(s): DDIMER in the last 72 hours. Hgb A1c  Recent Labs  04/05/16 0523  HGBA1C 6.1*   Lipid  Profile No results for input(s): CHOL, HDL, LDLCALC, TRIG, CHOLHDL, LDLDIRECT in the last 72 hours. Thyroid function studies  Recent Labs  04/05/16 0523  TSH 4.692*   Anemia work up No results for input(s): VITAMINB12, FOLATE, FERRITIN, TIBC, IRON, RETICCTPCT in the last 72 hours. Urinalysis    Component Value Date/Time   COLORURINE YELLOW 04/04/2016 1805   APPEARANCEUR CLEAR 04/04/2016 1805   LABSPEC 1.013 04/04/2016 1805   PHURINE 8.0 04/04/2016 1805   GLUCOSEU NEGATIVE 04/04/2016 1805   HGBUR NEGATIVE 04/04/2016 1805   HGBUR negative 06/20/2008 0813   BILIRUBINUR NEGATIVE 04/04/2016 1805   BILIRUBINUR n 03/10/2016 1321   KETONESUR NEGATIVE 04/04/2016 1805   PROTEINUR NEGATIVE 04/04/2016 1805   UROBILINOGEN 1.0 03/10/2016 1321   UROBILINOGEN 0.2 06/20/2008 0813   NITRITE NEGATIVE 04/04/2016 1805   LEUKOCYTESUR TRACE (A) 04/04/2016 1805   Sepsis Labs Invalid input(s): PROCALCITONIN,  WBC,  LACTICIDVEN Microbiology No results found for this or any previous visit (from the past 240 hour(s)).   Time coordinating discharge: Over 30 minutes  SIGNED:   Loretha Stapler, MD  [facility] 04/07/2016, 3:06 PM Pager [phone] If 7PM-7AM, please contact night-coverage [email or web address] Password [email or web address]

## 2016-04-06 NOTE — Progress Notes (Signed)
PROGRESS NOTE    Laurie Dominguez  [id number] DOB: [birth year] DOA: 04/04/2016 PCP: Nyoka Cowden, MD    Brief Narrative:  Laurie Dominguez is a 66 y.o. female with medical history significant of HTN, GERD, depression and morbid obesity.  Presented with shortness of breath and dyspnea on exertion worse for the past week and a half.  , Reports dyspnea on exertion been going on for the past 1 year she attributed to excessive weight she has no associated chest pain or leg edema but does take Lasix sometimes. Reports have had occasional ankle swelling. She used to smoke but not now, lately have had a runny nose denies cough, fever or wheezing. 1 Week ago she had burning with urination. She went to PCP and was told her Ua was negative. She has not been moving much, she has a lot of sinus drainage.  IN ER:  Temp (24hrs), Avg:98 F (36.7 C), Min:97.8 F (36.6 C), Max:98.1 F (36.7 C) R 16 oxygen saturation 76% on room air up to 92% on 5 L HR 84 WBC 10.1 hemoglobin 12.8 Troponin less than 0.03 BNP 20.4 CT and her chest no evidence of PE exercises and the base is noted prominent lymph nodes throughout the mediastinum likely reactive could not rule out edema no pleural effusions   Assessment & Plan:   Active Problems:   Essential hypertension   GERD   Morbid obesity ([place])   Hypoxia   Acute respiratory failure with hypoxia (HCC)   Acute respiratory failure with hypoxia and hypercarbia [facility])  - pulmonology consulted - encourage ambulation as toleration - echocardiogram: Left ventricle: The cavity size was normal. Wall thickness was increased in a pattern of mild LVH. Systolic function was vigorous. The estimated ejection fraction was in the range of 65% to 70%. Wall motion was normal; there were no regional wall motion abnormalities. Doppler parameters are consistent with abnormal left ventricular relaxation (grade 1 diastolic dysfunction). - monitor on telemetry - patient states she  has moments of panic with increased work of breathing - continue with nebulizers - add 20mg  PO lasix  today  Essential hypertension - stable - continue home medications  GERD - stable - continue home medications  Morbid obesity ([place]) - likely from excess caloric intake  DVT prophylaxis:    Lovenox    Code Status:  FULL CODE   as per patient   Family Communication:   husband is bedside Disposition Plan:     To home once workup is complete and patient is stable; currently requires 6L [place] while ambulating so not stable for discharge yet    Consultants:   Pulmonology  Procedures:   Echocardiogram on 04/05/16  Antimicrobials:   None    Subjective: Patient sitting in bed asking if she can go home today.  She says she feels better and is willing to go home on oxygen.  She says she is still getting dyspneic with ambulation but says she does not ambulate much at home.  Denies any chest pain.  Slept relatively well last night. No overnight events.  Objective: Vitals:   04/05/16 2019 04/05/16 2112 04/06/16 0628 04/06/16 0834  BP:  137/66 138/70   Pulse:  96 95   Resp:  17 17   Temp:  97.9 F (36.6 C) 98 F (36.7 C)   TempSrc:  Oral Oral   SpO2: 90% 96% 95% 92%  Weight:      Height:        Intake/Output Summary (Last 24  hours) at 04/06/16 1156 Last data filed at 04/06/16 0600  Gross per 24 hour  Intake                0 ml  Output                0 ml  Net                0 ml   Filed Weights   04/05/16 0307 04/05/16 0325  Weight: (!) 137 kg (302 lb) (!) 137 kg (302 lb 0.5 oz)    Examination:  General exam: Appears calm and comfortable  Respiratory system: Respiratory effort normal. No accessory muscle use, diminished breath sounds with few scattered crackles in bases Cardiovascular system: S1 & S2 heard, RRR. No JVD, murmurs, rubs, gallops or clicks. No pedal edema. Gastrointestinal system: Abdomen is obese, nondistended, soft and nontender. No organomegaly or  masses felt. Normal bowel sounds heard. Central nervous system: Alert and oriented. No focal neurological deficits. Extremities: Symmetric 5 x 5 power. Skin: No rashes, lesions or ulcers Psychiatry: Judgement and insight appear normal. Mood & affect appropriate.     Data Reviewed: I have personally reviewed following labs and imaging studies  CBC:  Recent Labs Lab 04/04/16 1805 04/06/16 0517  WBC 10.1 8.8  NEUTROABS 7.4  --   HGB 12.8 12.8  HCT 42.3 42.6  MCV 92.2 93.8  PLT 315 [id number]   Basic Metabolic Panel:  Recent Labs Lab 04/05/16 0240 04/05/16 0523 04/06/16 0517  NA 139  --  141  K 3.7  --  4.0  CL 98*  --  98*  CO2 34*  --  34*  GLUCOSE 112*  --  116*  BUN 14  --  15  CREATININE 0.68  --  0.63  CALCIUM 8.6*  --  8.6*  MG  --  2.0  --   PHOS  --  4.0  --    GFR: Estimated Creatinine Clearance: 94.1 mL/min (by C-G formula based on SCr of 0.63 mg/dL). Liver Function Tests:  Recent Labs Lab 04/05/16 0240 04/06/16 0517  AST 15 23  ALT 12* 11*  ALKPHOS 106 99  BILITOT 0.6 1.4*  PROT 6.9 7.2  ALBUMIN 3.4* 3.5   No results for input(s): LIPASE, AMYLASE in the last 168 hours. No results for input(s): AMMONIA in the last 168 hours. Coagulation Profile: No results for input(s): INR, PROTIME in the last 168 hours. Cardiac Enzymes:  Recent Labs Lab 04/04/16 1805  TROPONINI <0.03   BNP (last 3 results) No results for input(s): PROBNP in the last 8760 hours. HbA1C:  Recent Labs  04/05/16 0523  HGBA1C 6.1*   CBG: No results for input(s): GLUCAP in the last 168 hours. Lipid Profile: No results for input(s): CHOL, HDL, LDLCALC, TRIG, CHOLHDL, LDLDIRECT in the last 72 hours. Thyroid Function Tests:  Recent Labs  04/05/16 0523  TSH 4.692*   Anemia Panel: No results for input(s): VITAMINB12, FOLATE, FERRITIN, TIBC, IRON, RETICCTPCT in the last 72 hours. Sepsis Labs: No results for input(s): PROCALCITON, LATICACIDVEN in the last 168 hours.  No  results found for this or any previous visit (from the past 240 hour(s)).       Radiology Studies: Dg Chest 2 View  Result Date: 04/04/2016 CLINICAL DATA:  Shortness of breath. EXAM: CHEST  2 VIEW COMPARISON:  None. FINDINGS: The heart size and mediastinal contours are within normal limits. No pneumothorax or pleural effusion is noted. Left lung is  clear. Fluid is noted in the right minor fissure. No definite consolidative process is noted. The visualized skeletal structures are unremarkable. IMPRESSION: Fluid noted in right minor fissure. No other definite abnormality seen in the chest. Electronically Signed   By: Marijo Conception, M.D.   On: 04/04/2016 17:20   Ct Angio Chest Pe W And/or Wo Contrast  Result Date: 04/04/2016 CLINICAL DATA:  Headache, shortness of breath, dizziness. EXAM: CT ANGIOGRAPHY CHEST WITH CONTRAST TECHNIQUE: Multidetector CT imaging of the chest was performed using the standard protocol during bolus administration of intravenous contrast. Multiplanar CT image reconstructions and MIPs were obtained to evaluate the vascular anatomy. CONTRAST:  100 mL Isovue 370 COMPARISON:  12/05/2005 FINDINGS: Cardiovascular: Good opacification of the central and segmental pulmonary arteries. No focal filling defects. No evidence of significant pulmonary embolus. Mild cardiac enlargement. No pericardial effusion. Coronary artery calcifications. Normal caliber thoracic aorta with mild calcification. No aortic aneurysm or dissection. Mediastinum/Nodes: Prominent lymph nodes throughout the mediastinum, largest in the right paratracheal region measuring 12 mm short axis dimension. No change since prior study. Likely reactive. Thyroid gland is diffusely enlarged without focal nodularity. Esophagus is decompressed. Lungs/Pleura: Motion artifact limits evaluation of lungs. Linear areas of atelectasis in the lung bases. No focal consolidation. Patchy mosaic attenuation centrally may be due to motion  artifact or could indicate edema. No pleural effusions. No pneumothorax. Upper Abdomen: Left lobe of the liver appears enlarged, possibly indicating cirrhosis. Liver is incompletely included within the field of view and cannot be well characterized. Musculoskeletal: Mild degenerative changes in the spine. No destructive bone lesions. Review of the MIP images confirms the above findings. IMPRESSION: No evidence of significant pulmonary embolus. Cardiac enlargement. Mild mosaic attenuation pattern to the lungs could be due to motion artifact or may indicate edema. No pleural effusions. Atelectasis in the lung bases. Electronically Signed   By: Lucienne Capers M.D.   On: 04/04/2016 23:21        Scheduled Meds: . amLODipine  5 mg Oral Daily  . enoxaparin (LOVENOX) injection  60 mg Subcutaneous Daily  . fluticasone  2 spray Each Nare Daily  . guaiFENesin  600 mg Oral BID  . ipratropium-albuterol  3 mL Nebulization QID  . pantoprazole  40 mg Oral Daily  . PARoxetine  30 mg Oral q morning - 10a  . sodium chloride flush  3 mL Intravenous Q12H  . sodium chloride flush  3 mL Intravenous Q12H   Continuous Infusions:   LOS: 0 days    Time spent: 30 minutes    Loretha Stapler, MD [facility] Pager [phone]  If 7PM-7AM, please contact night-coverage [email or web address] Password [facility] 04/06/2016, 11:56 AM

## 2016-04-06 NOTE — Progress Notes (Signed)
SATURATION QUALIFICATIONS: (This note is used to comply with regulatory documentation for home oxygen)  Patient Saturations on Room Air at Rest = 75  Patient Saturations on 3 L= 80  On 4L= 82   on 6L= 85 while Ambulating     Please briefly explain why patient needs home oxygen: Pt O2 sat was 93% on 3L [place] at rest.  O 2 sat decreased to 75% at rest on RA. Pt placed back on  3L of O2 and ambulated 10 feet and oxygen saturation =80%on L. Oxygen increased to 4L, O2 sat = 82% on 4L. Oxygen increased to 6L, O2 sat = 85%. Pt went back to her room and sat in bed. Oxygen saturation took 3 min to get back to 92% on 6L . After 10 min O2 sat = 96% . O2 weaned back to 3l. Pt current O2= 93-94% on 3L at rest.

## 2016-04-07 DIAGNOSIS — R0902 Hypoxemia: Secondary | ICD-10-CM

## 2016-04-07 DIAGNOSIS — J9601 Acute respiratory failure with hypoxia: Secondary | ICD-10-CM | POA: Diagnosis not present

## 2016-04-07 DIAGNOSIS — I10 Essential (primary) hypertension: Secondary | ICD-10-CM | POA: Diagnosis not present

## 2016-04-07 MED ORDER — PAROXETINE HCL 30 MG PO TABS: 30.0000 mg | ORAL_TABLET | Freq: Every morning | ORAL | Status: DC

## 2016-04-07 MED ORDER — ALBUTEROL SULFATE HFA 108 (90 BASE) MCG/ACT IN AERS: 2.0000 | INHALATION_SPRAY | RESPIRATORY_TRACT | 1 refills | Status: DC | PRN

## 2016-04-07 MED ORDER — GUAIFENESIN ER 600 MG PO TB12: 600.0000 mg | ORAL_TABLET | Freq: Two times a day (BID) | ORAL | 0 refills | Status: AC

## 2016-04-07 MED ORDER — ALBUTEROL SULFATE (2.5 MG/3ML) 0.083% IN NEBU: 3.0000 mL | INHALATION_SOLUTION | RESPIRATORY_TRACT | Status: DC | PRN

## 2016-04-07 MED ORDER — FUROSEMIDE 20 MG PO TABS: 20.0000 mg | ORAL_TABLET | Freq: Every day | ORAL | 0 refills | Status: DC

## 2016-04-07 MED ORDER — FLUTICASONE PROPIONATE 50 MCG/ACT NA SUSP: 2.0000 | Freq: Every day | NASAL | 0 refills | Status: DC

## 2016-04-07 MED ORDER — FUROSEMIDE 10 MG/ML IJ SOLN
10.0000 mg | Freq: Every day | INTRAMUSCULAR | Status: DC
  Filled 2016-04-07: qty 2

## 2016-04-07 MED ORDER — FUROSEMIDE 10 MG/ML IJ SOLN: 20.0000 mg | Freq: Every day | INTRAMUSCULAR | Status: DC

## 2016-04-07 MED ORDER — BUDESONIDE-FORMOTEROL FUMARATE 160-4.5 MCG/ACT IN AERO: 2.0000 | INHALATION_SPRAY | Freq: Two times a day (BID) | RESPIRATORY_TRACT | 1 refills | Status: DC

## 2016-04-07 NOTE — Evaluation (Signed)
Physical Therapy Evaluation Patient Details Name: Laurie Dominguez MRN: [id number] DOB: December 24, 1950 Today's Date: 04/07/2016   History of Present Illness  66 yo F morbidly obese former smoker with medical hx HBP, GERD, depression. Presented to PCP 1/12 with 1 week hx increased dyspnea, malaise, mild headache.    Clinical Impression  Pt not requiring assist for general functional mobility however monitored oxygen need and educated patient with signs of decreased oxygen, short bursts of mobility followed by deep breathing exercises and education . To benefit from further HHPT to educate and increased tolerance to mobility, strengthening and safety.     SATURATION QUALIFICATIONS: (This note is used to comply with regulatory documentation for home oxygen)  Found pt in bathroom on room air when entered into the bathroom. She stated she had been off O2 for about 4 minutes, once she return to sitting EOB on RA O2 checked and at 68% on RA.    Patient Saturations on Room Air at Rest = 68%  Patient Saturations on   3 Liters at rest back up to 96% within 1-2 minutes and breathing exercises.  Pt on 3 L ambulation 20 feet dropped to 76%, bumped to 4 L while up and 84%   Returned to sitting EOB on 4 L with short walk 40 feet returned to 90 % in 2 minutes with deep breathing.   Even with talking at rest on 3 L ., pt drops to 84%, but recovers with no talking and prompted to deep breath.        Follow Up Recommendations Home health PT (to assist with oxygen monitoring with activitya nd to increase ability with mobility, and activity tolerance safely)    Equipment Recommendations  None recommended by PT    Recommendations for Other Services       Precautions / Restrictions Precautions Precautions: None Restrictions Weight Bearing Restrictions: No      Mobility  Bed Mobility Overal bed mobility: Independent                Transfers Overall transfer level: Independent Equipment  used: Rolling walker (2 wheeled)             General transfer comment: independent with transfers adn mobility  Ambulation/Gait Ambulation/Gait assistance: Supervision Ambulation Distance (Feet): 40 Feet Assistive device: Rolling walker (2 wheeled) Gait Pattern/deviations: Step-through pattern     General Gait Details: tolerated well, however half way through on 3 L O2, pt was at 78-84%, bumped to 4 L and began to increase to 86% returned to her sitting position on the bed. Pt was symptomatic with wekaness beginning to be felt in back of knees. Educated patient the reasoning behind this for she thought it was her legs, and is associated with her O2 levels .   Stairs            Wheelchair Mobility    Modified Rankin (Stroke Patients Only)       Balance                                             Pertinent Vitals/Pain Pain Assessment: No/denies pain    Home Living Family/patient expects to be discharged to:: Private residence Living Arrangements: Spouse/significant other Available Help at Discharge: Family Type of Home: House Home Access: Level entry     [place]: One Victoria Vera: Environmental consultant - 4 wheels;IT trainer  scooter      Prior Function Level of Independence: Independent with assistive device(s)         Comments: uses 4 wheeled RW for short distances in her home. also uses scooter . she states she does not walk much.      Hand Dominance        Extremity/Trunk Assessment        Lower Extremity Assessment Lower Extremity Assessment: Generalized weakness       Communication   Communication: No difficulties  Cognition Arousal/Alertness: Awake/alert Behavior During Therapy: WFL for tasks assessed/performed Overall Cognitive Status: Within Functional Limits for tasks assessed                      General Comments      Exercises Other Exercises Other Exercises: educated patient about oxygen continuous  need , and deep breathing exercises, as well as short intervals at this time in order to maintain appropriate oxygen levels    Assessment/Plan    PT Assessment All further PT needs can be met in the next venue of care  PT Problem List Decreased strength;Decreased activity tolerance;Decreased mobility          PT Treatment Interventions Gait training;Functional mobility training;Therapeutic activities;Therapeutic exercise;Patient/family education    PT Goals (Current goals can be found in the Care Plan section)  Acute Rehab PT Goals Patient Stated Goal: I want to go home this afternoon because I don't want any more hopsital bills PT Goal Formulation: All assessment and education complete, DC therapy (pt will be followed by [facility] services )    Frequency Min 3X/week   Barriers to discharge        Co-evaluation               End of Session Equipment Utilized During Treatment: Oxygen Activity Tolerance: Patient tolerated treatment well Patient left: in bed Nurse Communication: Mobility status    Functional Assessment Tool Used: clincial judgement  Functional Limitation: Mobility: Walking and moving around Mobility: Walking and Moving Around Current Status (J4782): At least 1 percent but less than 20 percent impaired, limited or restricted Mobility: Walking and Moving Around Goal Status [phone]): At least 1 percent but less than 20 percent impaired, limited or restricted Mobility: Walking and Moving Around Discharge Status [phone]): At least 1 percent but less than 20 percent impaired, limited or restricted    Time: 1130-1200 PT Time Calculation (min) (ACUTE ONLY): 30 min   Charges:   PT Evaluation $PT Eval Low Complexity: 1 Procedure PT Treatments $Gait Training: 8-22 mins   PT G Codes:   PT G-Codes **NOT FOR INPATIENT CLASS** Functional Assessment Tool Used: clincial judgement  Functional Limitation: Mobility: Walking and moving around Mobility: Walking and Moving  Around Current Status ([id number]): At least 1 percent but less than 20 percent impaired, limited or restricted Mobility: Walking and Moving Around Goal Status [phone]): At least 1 percent but less than 20 percent impaired, limited or restricted Mobility: Walking and Moving Around Discharge Status [phone]): At least 1 percent but less than 20 percent impaired, limited or restricted    Clide Dales 04/07/2016, 12:18 PM  Clide Dales, PT Pager: [phone] 04/07/2016

## 2016-04-07 NOTE — Progress Notes (Signed)
Pt selected [place] for [facility] needs. Will need HHRN,HHPT and DME O2.

## 2016-04-07 NOTE — Progress Notes (Signed)
Pt's Symibcort at [employer] pay $45.00.  Pt was made aware.

## 2016-04-07 NOTE — Progress Notes (Signed)
   Name: Laurie Dominguez MRN: [id number] DOB: 1950/03/28    ADMISSION DATE:  04/04/2016 CONSULTATION DATE:  04/06/15  REFERRING MD :  Dr Sheran Fava [facility]  CHIEF COMPLAINT:  SOB  SIGNIFICANT EVENTS    STUDIES:  CTa 04/04/16-  ECHO 04/05/16   HISTORY OF PRESENT ILLNESS:  66 yo F morbidly obese former smoker with medical hx HBP, GERD, depression. Presented to PCP 1/12 with 1 week hx increased dyspnea, malaise, mild headache. Denies fever, cough, myalgias, chest pain, edema, palpitation, leg pain. Had flu shot. Denies other cardiopulmonary disease.Did question early UTI with slight dysuria. She had hurt her R leg recently trying to get out of bathtub, but indicates that resolved. CTa chest (reviewed by me) shows mosaic density suggestive of edema or pneumonitis. No PE or pneumonia.   SUBJECTIVE:  Feels better over all. Wants to go home. Has chronic SOB, does little exertion. SOB worse last 1-2 weeks. Denies fevers, cough, cp. Has snoring, unrefreshed sleep.     VITAL SIGNS: Temp:  [97.6 F (36.4 C)-98.7 F (37.1 C)] 97.6 F (36.4 C) (01/15 0420) Pulse Rate:  [81-89] 81 (01/15 0420) Resp:  [18-20] 18 (01/15 0420) BP: (128-146)/(67-75) 128/75 (01/15 0420) SpO2:  [90 %-97 %] 95 % (01/15 0814) Weight:  [136.5 kg (300 lb 14.9 oz)] 136.5 kg (300 lb 14.9 oz) (01/15 0420)  PHYSICAL EXAMINATION: General:  Pleasant, morbidly obese F, alert and cooperative, lying in bed, NAD.  Neuro:  Oriented, non-focal HEENT:  Mucosa clear, no stridor or JVD Cardiovascular: RRR, no m/g/r, no edema Lungs:  Shallow in bases, few crackles in bases. (-) wheezing/rhonchi Abdomen:  Obese, protuberant, soft, nl BS Musculoskeletal:  Moves all extremities Skin:  Areas of eczema or possibly psoriasis on legs  Recent Labs Lab 04/05/16 0240 04/06/16 0517  NA 139 141  K 3.7 4.0  CL 98* 98*  CO2 34* 34*  BUN 14 15  CREATININE 0.68 0.63  GLUCOSE 112* 116*    Recent Labs Lab 04/04/16 1805 04/06/16 0517    HGB 12.8 12.8  HCT 42.3 42.6  WBC 10.1 8.8  PLT 315 321   No results found.  ASSESSMENT / PLAN:  Acute on chronic hypoxemic hypercapneic resp fx 2/2 multifactorial :  Likely with undiagnosed OSA  OHS  CHF pEF exacerbation.   Possible COPD with mild flare  No evidence for infection P-  Anticipate d/c soon If for d/c, may try on Symbicort 160/4.5 2P BID and alb MDI 2 puffs q4 hrs prn.  Needs : F/U with pulmonary in 1-2 weeks.  Needs outpt Sleep study as well as PFT.  Need to determine if she will need O2 on d/c > pls order a walk test on day of d/c. Keep O2 sats > 88% Cont diuresis.  Plan d/w pt.   Monica Becton, MD 04/07/2016, 9:54 AM [place] Pager [phone] After 3 pm or if no answer, call [phone]

## 2016-04-07 NOTE — Progress Notes (Signed)
SATURATION QUALIFICATIONS: (This note is used to comply with regulatory documentation for home oxygen)  Patient Saturations on Room Air at Rest = 75%  Patient Saturations on Room Air while Ambulating = 71%  Patient Saturations on 3 Liters of oxygen while Ambulating = 90%  Please briefly explain why patient needs home oxygen:

## 2016-04-14 DIAGNOSIS — I10 Essential (primary) hypertension: Secondary | ICD-10-CM | POA: Diagnosis not present

## 2016-04-14 DIAGNOSIS — Z87891 Personal history of nicotine dependence: Secondary | ICD-10-CM | POA: Diagnosis not present

## 2016-04-14 DIAGNOSIS — Z9981 Dependence on supplemental oxygen: Secondary | ICD-10-CM | POA: Diagnosis not present

## 2016-04-14 DIAGNOSIS — Z79891 Long term (current) use of opiate analgesic: Secondary | ICD-10-CM | POA: Diagnosis not present

## 2016-04-14 DIAGNOSIS — J441 Chronic obstructive pulmonary disease with (acute) exacerbation: Secondary | ICD-10-CM | POA: Diagnosis not present

## 2016-04-14 DIAGNOSIS — K219 Gastro-esophageal reflux disease without esophagitis: Secondary | ICD-10-CM | POA: Diagnosis not present

## 2016-04-16 ENCOUNTER — Telehealth: Payer: Self-pay | Admitting: Internal Medicine

## 2016-04-16 DIAGNOSIS — K219 Gastro-esophageal reflux disease without esophagitis: Secondary | ICD-10-CM | POA: Diagnosis not present

## 2016-04-16 DIAGNOSIS — J441 Chronic obstructive pulmonary disease with (acute) exacerbation: Secondary | ICD-10-CM | POA: Diagnosis not present

## 2016-04-16 DIAGNOSIS — Z9981 Dependence on supplemental oxygen: Secondary | ICD-10-CM | POA: Diagnosis not present

## 2016-04-16 DIAGNOSIS — I10 Essential (primary) hypertension: Secondary | ICD-10-CM | POA: Diagnosis not present

## 2016-04-16 DIAGNOSIS — Z79891 Long term (current) use of opiate analgesic: Secondary | ICD-10-CM | POA: Diagnosis not present

## 2016-04-16 DIAGNOSIS — Z87891 Personal history of nicotine dependence: Secondary | ICD-10-CM | POA: Diagnosis not present

## 2016-04-16 NOTE — Telephone Encounter (Signed)
See message below, please advise.

## 2016-04-16 NOTE — Telephone Encounter (Signed)
Amber needs verbal order for PT for twice  a wk for 3 wks.

## 2016-04-16 NOTE — Telephone Encounter (Signed)
Okay for physical therapy.

## 2016-04-17 NOTE — Telephone Encounter (Signed)
Spoke with Laurie Dominguez,PT verbal orders given per Dr. Raliegh Ip for PT twice a wk for 3 wks.

## 2016-04-18 DIAGNOSIS — J441 Chronic obstructive pulmonary disease with (acute) exacerbation: Secondary | ICD-10-CM | POA: Diagnosis not present

## 2016-04-18 DIAGNOSIS — Z87891 Personal history of nicotine dependence: Secondary | ICD-10-CM | POA: Diagnosis not present

## 2016-04-18 DIAGNOSIS — K219 Gastro-esophageal reflux disease without esophagitis: Secondary | ICD-10-CM | POA: Diagnosis not present

## 2016-04-18 DIAGNOSIS — Z9981 Dependence on supplemental oxygen: Secondary | ICD-10-CM | POA: Diagnosis not present

## 2016-04-18 DIAGNOSIS — I10 Essential (primary) hypertension: Secondary | ICD-10-CM | POA: Diagnosis not present

## 2016-04-18 DIAGNOSIS — Z79891 Long term (current) use of opiate analgesic: Secondary | ICD-10-CM | POA: Diagnosis not present

## 2016-04-21 DIAGNOSIS — Z9981 Dependence on supplemental oxygen: Secondary | ICD-10-CM | POA: Diagnosis not present

## 2016-04-21 DIAGNOSIS — Z79891 Long term (current) use of opiate analgesic: Secondary | ICD-10-CM | POA: Diagnosis not present

## 2016-04-21 DIAGNOSIS — Z87891 Personal history of nicotine dependence: Secondary | ICD-10-CM | POA: Diagnosis not present

## 2016-04-21 DIAGNOSIS — K219 Gastro-esophageal reflux disease without esophagitis: Secondary | ICD-10-CM | POA: Diagnosis not present

## 2016-04-21 DIAGNOSIS — I10 Essential (primary) hypertension: Secondary | ICD-10-CM | POA: Diagnosis not present

## 2016-04-21 DIAGNOSIS — J441 Chronic obstructive pulmonary disease with (acute) exacerbation: Secondary | ICD-10-CM | POA: Diagnosis not present

## 2016-04-23 ENCOUNTER — Telehealth: Payer: Self-pay | Admitting: Internal Medicine

## 2016-04-23 NOTE — Telephone Encounter (Addendum)
Laurie Dominguez needs a order for humification for pt  oxygen delivery system verbal is ok

## 2016-04-23 NOTE — Telephone Encounter (Signed)
Laurie Dominguez calling to check the status of the order.

## 2016-04-24 ENCOUNTER — Ambulatory Visit (INDEPENDENT_AMBULATORY_CARE_PROVIDER_SITE_OTHER): Payer: Medicare Other | Admitting: Internal Medicine

## 2016-04-24 ENCOUNTER — Encounter: Payer: Self-pay | Admitting: Internal Medicine

## 2016-04-24 DIAGNOSIS — I10 Essential (primary) hypertension: Secondary | ICD-10-CM | POA: Diagnosis not present

## 2016-04-24 DIAGNOSIS — E662 Morbid (severe) obesity with alveolar hypoventilation: Secondary | ICD-10-CM

## 2016-04-24 DIAGNOSIS — J441 Chronic obstructive pulmonary disease with (acute) exacerbation: Secondary | ICD-10-CM | POA: Diagnosis not present

## 2016-04-24 DIAGNOSIS — Z9981 Dependence on supplemental oxygen: Secondary | ICD-10-CM | POA: Diagnosis not present

## 2016-04-24 DIAGNOSIS — J9601 Acute respiratory failure with hypoxia: Secondary | ICD-10-CM

## 2016-04-24 DIAGNOSIS — Z87891 Personal history of nicotine dependence: Secondary | ICD-10-CM | POA: Diagnosis not present

## 2016-04-24 DIAGNOSIS — Z79891 Long term (current) use of opiate analgesic: Secondary | ICD-10-CM | POA: Diagnosis not present

## 2016-04-24 DIAGNOSIS — K219 Gastro-esophageal reflux disease without esophagitis: Secondary | ICD-10-CM | POA: Diagnosis not present

## 2016-04-24 NOTE — Progress Notes (Signed)
Pre visit review using our clinic review tool, if applicable. No additional management support is needed unless otherwise documented below in the visit note. 

## 2016-04-24 NOTE — Progress Notes (Signed)
Subjective:    Patient ID: Laurie Dominguez, female    DOB: 10-14-1950, 66 y.o.   MRN: [id number]  HPI  66 year old patient who is seen following a recent hospital discharge. Hospital records reviewed  Patient has a history of morbid obesity and presented to the office on the day of admission with worsening shortness of breath.  She was noted have severe resting hypoxemia and was admitted for further evaluation.  She was discharged on chronic O2 therapy.  She continues to receive home physical therapy.  She has had some modest weight loss from 302 to  288 pounds.  She generally feels well She uses a scooter  Evaluation included a 2-D echocardiogram revealed a normal ejection fraction.  BNP was normal.  CTA was negative for pulmonary embolism  Past Medical History:  Diagnosis Date  . Colon polyp 08/16/2004   Hyperplastic  . Depression   . GERD (gastroesophageal reflux disease)   . Hypertension   . Nephrolithiasis   . Obesity      Social History   Social History  . Marital status: Married    Spouse name: N/A  . Number of children: N/A  . Years of education: N/A   Occupational History  . Not on file.   Social History Main Topics  . Smoking status: Former Smoker    Quit date: 03/24/2006  . Smokeless tobacco: Never Used  . Alcohol use No  . Drug use: No  . Sexual activity: Not on file   Other Topics Concern  . Not on file   Social History Narrative  . No narrative on file    Past Surgical History:  Procedure Laterality Date  . CHOLECYSTECTOMY    . DILATION AND CURETTAGE OF UTERUS    . TONSILLECTOMY      Family History  Problem Relation Age of Onset  . Cancer Mother     unknown primary  . Other Father     spinal cord tumor  . Liver cancer Mother     unknown primary    Allergies  Allergen Reactions  . Codeine Phosphate     Current Outpatient Prescriptions on File Prior to Visit  Medication Sig Dispense Refill  . albuterol (PROVENTIL HFA;VENTOLIN HFA)  108 (90 Base) MCG/ACT inhaler Inhale 2 puffs into the lungs every 4 (four) hours as needed for wheezing or shortness of breath. 1 Inhaler 1  . amLODipine (NORVASC) 5 MG tablet Take 1 tablet by mouth  daily 90 tablet 3  . budesonide-formoterol (SYMBICORT) 160-4.5 MCG/ACT inhaler Inhale 2 puffs into the lungs 2 (two) times daily. 1 Inhaler 1  . diazepam (VALIUM) 2 MG tablet Take 1 tablet (2 mg total) by mouth at bedtime as needed. (Patient taking differently: Take 2 mg by mouth at bedtime as needed for anxiety or muscle spasms. ) 90 tablet 1  . fluticasone (FLONASE) 50 MCG/ACT nasal spray Place 2 sprays into both nostrils daily. 1 g 0  . furosemide (LASIX) 20 MG tablet Take 1 tablet (20 mg total) by mouth daily. 30 tablet 0  . losartan-hydrochlorothiazide (HYZAAR) 100-25 MG tablet TAKE 1 TABLET BY MOUTH  DAILY 90 tablet 1  . meclizine (ANTIVERT) 25 MG tablet Take 1 tablet (25 mg total) by mouth daily as needed. (Patient taking differently: Take 25 mg by mouth 2 (two) times daily as needed for dizziness or nausea. ) 90 tablet 1  . naproxen (NAPROSYN) 500 MG tablet TAKE 1 TABLET BY MOUTH 2  TIMES DAILY WITH A MEAL.  180 tablet 3  . omeprazole (PRILOSEC) 20 MG capsule Take 1 capsule by mouth  every day 90 capsule 3  . PARoxetine (PAXIL) 30 MG tablet Take 1 tablet (30 mg total) by mouth every morning.    . traMADol (ULTRAM) 50 MG tablet Take 1 tablet (50 mg total) by mouth every 6 (six) hours as needed. (Patient taking differently: Take 50 mg by mouth every 6 (six) hours as needed for moderate pain or severe pain. ) 180 tablet 1   No current facility-administered medications on file prior to visit.     BP 100/80 (BP Location: Right Arm, Patient Position: Sitting, Cuff Size: Normal)   Pulse 87   Temp 98.8 F (37.1 C) (Oral)   Ht 5\' 3"  (1.6 m)   Wt 288 lb 9.6 oz (130.9 kg)   SpO2 94% Dominguez: 5 LITERS  BMI 51.12 kg/m     Review of Systems  Constitutional: Negative.   HENT: Negative for  congestion, dental problem, hearing loss, rhinorrhea, sinus pressure, sore throat and tinnitus.   Eyes: Negative for pain, discharge and visual disturbance.  Respiratory: Negative for cough and shortness of breath.   Cardiovascular: Negative for chest pain, palpitations and leg swelling.  Gastrointestinal: Negative for abdominal distention, abdominal pain, blood in stool, constipation, diarrhea, nausea and vomiting.  Endocrine: Positive for polyuria.  Genitourinary: Negative for difficulty urinating, dysuria, flank pain, frequency, hematuria, pelvic pain, urgency, vaginal bleeding, vaginal discharge and vaginal pain.  Musculoskeletal: Negative for arthralgias, gait problem and joint swelling.  Skin: Negative for rash.  Neurological: Negative for dizziness, syncope, speech difficulty, weakness, numbness and headaches.  Hematological: Negative for adenopathy.  Psychiatric/Behavioral: Negative for agitation, behavioral problems and dysphoric mood. The patient is not nervous/anxious.        Objective:   Physical Exam  Constitutional: She is oriented to person, place, and time. She appears well-developed and well-nourished.  Sitting in a scooter Blood pressure well controlled O2 sats saturation on supplemental oxygen 94%  Weight 288  HENT:  Head: Normocephalic.  Right Ear: External ear normal.  Left Ear: External ear normal.  Mouth/Throat: Oropharynx is clear and moist.  Eyes: Conjunctivae and EOM are normal. Pupils are equal, round, and reactive to light.  Neck: Normal range of motion. Neck supple. No thyromegaly present.  Cardiovascular: Normal rate, regular rhythm, normal heart sounds and intact distal pulses.   Pulmonary/Chest: Effort normal and breath sounds normal.  Decreased breath sounds at the bases  Abdominal: Soft. Bowel sounds are normal. She exhibits no mass. There is no tenderness.  Musculoskeletal: Normal range of motion. She exhibits no edema.  No peripheral edema    Lymphadenopathy:    She has no cervical adenopathy.  Neurological: She is alert and oriented to person, place, and time.  Skin: Skin is warm and dry. No rash noted.  Psychiatric: She has a normal mood and affect. Her behavior is normal.          Assessment & Plan:   Obesity hypoventilation syndrome.  Continue supplemental O2 and efforts at weight loss.  Schedule pulmonary follow-up.  Consider PFTs and possible sleep study Hypertension, well-controlled History depression, stable Impaired glucose tolerance Morbid obesity.  Continue efforts at weight loss  Schedule pulmonary follow-up No change in medical regimen Follow-up 3 months or as needed  Nyoka Cowden

## 2016-04-24 NOTE — Patient Instructions (Signed)
Pulmonary follow-up as scheduled  Limit your sodium (Salt) intake  Please check your blood pressure on a regular basis.  If it is consistently greater than 150/90, please make an office appointment.  Return in 3 months for follow-up

## 2016-04-28 NOTE — Telephone Encounter (Signed)
See message below. Please advise is it ok to give verbal orders.

## 2016-04-28 NOTE — Telephone Encounter (Signed)
Okay for verbal order for systems humidification

## 2016-04-29 DIAGNOSIS — I10 Essential (primary) hypertension: Secondary | ICD-10-CM | POA: Diagnosis not present

## 2016-04-29 DIAGNOSIS — Z87891 Personal history of nicotine dependence: Secondary | ICD-10-CM | POA: Diagnosis not present

## 2016-04-29 DIAGNOSIS — Z9981 Dependence on supplemental oxygen: Secondary | ICD-10-CM | POA: Diagnosis not present

## 2016-04-29 DIAGNOSIS — K219 Gastro-esophageal reflux disease without esophagitis: Secondary | ICD-10-CM | POA: Diagnosis not present

## 2016-04-29 DIAGNOSIS — J441 Chronic obstructive pulmonary disease with (acute) exacerbation: Secondary | ICD-10-CM | POA: Diagnosis not present

## 2016-04-29 DIAGNOSIS — Z79891 Long term (current) use of opiate analgesic: Secondary | ICD-10-CM | POA: Diagnosis not present

## 2016-04-29 NOTE — Telephone Encounter (Signed)
Called Anderson Malta to give Verbal orders for humiliation, no answer left message to call office back.

## 2016-04-30 NOTE — Telephone Encounter (Signed)
Verbal orders given to jennifer per Dr. Raliegh Ip

## 2016-04-30 NOTE — Telephone Encounter (Signed)
Left message on voicemail to call office.  

## 2016-05-01 DIAGNOSIS — I10 Essential (primary) hypertension: Secondary | ICD-10-CM | POA: Diagnosis not present

## 2016-05-01 DIAGNOSIS — K219 Gastro-esophageal reflux disease without esophagitis: Secondary | ICD-10-CM | POA: Diagnosis not present

## 2016-05-01 DIAGNOSIS — Z79891 Long term (current) use of opiate analgesic: Secondary | ICD-10-CM | POA: Diagnosis not present

## 2016-05-01 DIAGNOSIS — J441 Chronic obstructive pulmonary disease with (acute) exacerbation: Secondary | ICD-10-CM | POA: Diagnosis not present

## 2016-05-01 DIAGNOSIS — Z9981 Dependence on supplemental oxygen: Secondary | ICD-10-CM | POA: Diagnosis not present

## 2016-05-01 DIAGNOSIS — Z87891 Personal history of nicotine dependence: Secondary | ICD-10-CM | POA: Diagnosis not present

## 2016-05-07 DIAGNOSIS — I10 Essential (primary) hypertension: Secondary | ICD-10-CM | POA: Diagnosis not present

## 2016-05-07 DIAGNOSIS — Z9981 Dependence on supplemental oxygen: Secondary | ICD-10-CM | POA: Diagnosis not present

## 2016-05-07 DIAGNOSIS — J441 Chronic obstructive pulmonary disease with (acute) exacerbation: Secondary | ICD-10-CM | POA: Diagnosis not present

## 2016-05-07 DIAGNOSIS — K219 Gastro-esophageal reflux disease without esophagitis: Secondary | ICD-10-CM | POA: Diagnosis not present

## 2016-05-07 DIAGNOSIS — Z87891 Personal history of nicotine dependence: Secondary | ICD-10-CM | POA: Diagnosis not present

## 2016-05-07 DIAGNOSIS — Z79891 Long term (current) use of opiate analgesic: Secondary | ICD-10-CM | POA: Diagnosis not present

## 2016-05-08 DIAGNOSIS — J9601 Acute respiratory failure with hypoxia: Secondary | ICD-10-CM | POA: Diagnosis not present

## 2016-05-09 ENCOUNTER — Telehealth: Payer: Self-pay | Admitting: Emergency Medicine

## 2016-05-09 ENCOUNTER — Other Ambulatory Visit: Payer: Self-pay | Admitting: Internal Medicine

## 2016-05-09 DIAGNOSIS — Z9981 Dependence on supplemental oxygen: Secondary | ICD-10-CM | POA: Diagnosis not present

## 2016-05-09 DIAGNOSIS — J441 Chronic obstructive pulmonary disease with (acute) exacerbation: Secondary | ICD-10-CM | POA: Diagnosis not present

## 2016-05-09 DIAGNOSIS — I10 Essential (primary) hypertension: Secondary | ICD-10-CM | POA: Diagnosis not present

## 2016-05-09 DIAGNOSIS — Z87891 Personal history of nicotine dependence: Secondary | ICD-10-CM | POA: Diagnosis not present

## 2016-05-09 DIAGNOSIS — Z79891 Long term (current) use of opiate analgesic: Secondary | ICD-10-CM | POA: Diagnosis not present

## 2016-05-09 DIAGNOSIS — K219 Gastro-esophageal reflux disease without esophagitis: Secondary | ICD-10-CM | POA: Diagnosis not present

## 2016-05-09 MED ORDER — FUROSEMIDE 20 MG PO TABS: 20.0000 mg | ORAL_TABLET | Freq: Every day | ORAL | 0 refills | Status: DC

## 2016-05-09 NOTE — Telephone Encounter (Signed)
Pt would like a refill for furosemide. Okay to refill?   [facility]

## 2016-05-09 NOTE — Telephone Encounter (Signed)
Okay to refill? 

## 2016-05-09 NOTE — Addendum Note (Signed)
Addended by: Milford Cage on: 05/09/2016 03:57 PM   Modules accepted: Orders

## 2016-05-16 DIAGNOSIS — H5213 Myopia, bilateral: Secondary | ICD-10-CM | POA: Diagnosis not present

## 2016-05-16 DIAGNOSIS — H25813 Combined forms of age-related cataract, bilateral: Secondary | ICD-10-CM | POA: Diagnosis not present

## 2016-06-05 DIAGNOSIS — J9601 Acute respiratory failure with hypoxia: Secondary | ICD-10-CM | POA: Diagnosis not present

## 2016-06-12 ENCOUNTER — Ambulatory Visit (INDEPENDENT_AMBULATORY_CARE_PROVIDER_SITE_OTHER): Payer: Medicare Other | Admitting: Internal Medicine

## 2016-06-12 ENCOUNTER — Encounter: Payer: Self-pay | Admitting: Internal Medicine

## 2016-06-12 VITALS — BP 114/78 | HR 90 | Ht 62.0 in | Wt 279.0 lb

## 2016-06-12 DIAGNOSIS — J9611 Chronic respiratory failure with hypoxia: Secondary | ICD-10-CM

## 2016-06-12 DIAGNOSIS — J9601 Acute respiratory failure with hypoxia: Secondary | ICD-10-CM

## 2016-06-12 DIAGNOSIS — J449 Chronic obstructive pulmonary disease, unspecified: Secondary | ICD-10-CM

## 2016-06-12 DIAGNOSIS — G4733 Obstructive sleep apnea (adult) (pediatric): Secondary | ICD-10-CM | POA: Diagnosis not present

## 2016-06-12 DIAGNOSIS — E662 Morbid (severe) obesity with alveolar hypoventilation: Secondary | ICD-10-CM

## 2016-06-12 NOTE — Progress Notes (Signed)
06/[birth year]11-66 year old female former smoker referred courtesy of Dr Burnice Logan; Never had sleep study. Pt's husband states patient snores. Denies any waking up gasping for air. Pt uses O2. O2 2-4L/ [facility] Medical problems include respiratory failure with hypoxia and hypercapnea, depression, HBP, GERD, morbid obesity, obesity hypoventilation syndrome She had lost 20 pounds of water weight when hospitalized for hypoxic respiratory failure this winter, then discharged on home oxygen. 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 Husband says she still snores. She admits some daytime sleepiness but says she can stay awake if she wants to. ENT surgery-tonsils and adenoids, allergic rhinitis treated with Flonase but with residual nasal stuffiness  Epworth sleepiness score 7/24 Office Spirometry 06/12/2016-moderate restriction of exhaled volume, moderate to severe obstructive airways disease-FVC 1.80/63%, FEV1 1.29/59%, ratio 0.71, FEF 25-75% 0.87/45%.  Prior to Admission medications   Medication Sig Start Date End Date Taking? Authorizing Provider  albuterol (PROVENTIL HFA;VENTOLIN HFA) 108 (90 Base) MCG/ACT inhaler Inhale 2 puffs into the lungs every 4 (four) hours as needed for wheezing or shortness of breath. 04/07/16  Yes Eber Jones, MD  amLODipine [facility]) 5 MG tablet Take 1 tablet by mouth  daily 11/06/15  Yes Marletta Lor, MD  budesonide-formoterol [facility]) 160-4.5 MCG/ACT inhaler Inhale 2 puffs into the lungs 2 (two) times daily. 04/07/16  Yes Eber Jones, MD  diazepam (VALIUM) 2 MG tablet Take 1 tablet (2 mg total) by mouth at bedtime as needed. Patient taking differently: Take 2 mg by mouth at bedtime as needed for anxiety or muscle spasms.  09/26/15  Yes Marletta Lor, MD  fluticasone [facility]) 50 MCG/ACT nasal spray Place 2 sprays into both nostrils daily. 04/07/16 06/12/16 Yes Eber Jones, MD  furosemide  (LASIX) 20 MG tablet Take 1 tablet (20 mg total) by mouth daily. 05/09/16  Yes Marletta Lor, MD  losartan-hydrochlorothiazide [facility]) 100-25 MG tablet TAKE 1 TABLET BY MOUTH  DAILY 05/09/16  Yes Marletta Lor, MD  meclizine (ANTIVERT) 25 MG tablet Take 1 tablet (25 mg total) by mouth daily as needed. Patient taking differently: Take 25 mg by mouth 2 (two) times daily as needed for dizziness or nausea.  06/07/14  Yes Marletta Lor, MD  naproxen (NAPROSYN) 500 MG tablet TAKE 1 TABLET BY MOUTH 2  TIMES DAILY WITH A MEAL. 03/08/15  Yes Marletta Lor, MD  omeprazole (PRILOSEC) 20 MG capsule Take 1 capsule by mouth  every day 11/06/15  Yes Marletta Lor, MD  PARoxetine (PAXIL) 30 MG tablet Take 1 tablet (30 mg total) by mouth every morning. 04/07/16  Yes Eber Jones, MD  traMADol (ULTRAM) 50 MG tablet Take 1 tablet (50 mg total) by mouth every 6 (six) hours as needed. Patient taking differently: Take 50 mg by mouth every 6 (six) hours as needed for moderate pain or severe pain.  09/26/15  Yes Marletta Lor, MD   Past Medical History:  Diagnosis Date  . Colon polyp 08/16/2004   Hyperplastic  . Depression   . GERD (gastroesophageal reflux disease)   . Hypertension   . Nephrolithiasis   . Obesity    Past Surgical History:  Procedure Laterality Date  . CHOLECYSTECTOMY    . DILATION AND CURETTAGE OF UTERUS    . TONSILLECTOMY     Family History  Problem Relation Age of Onset  . Cancer Mother     unknown primary  . Liver cancer Mother  unknown primary  . Other Father     spinal cord tumor   Social History   Social History  . Marital status: Married    Spouse name: N/A  . Number of children: N/A  . Years of education: N/A   Occupational History  . Not on file.   Social History Main Topics  . Smoking status: Former Smoker    Packs/day: 2.00    Years: 40.00    Types: Cigarettes    Quit date: 03/24/2006  . Smokeless tobacco: Never Used  .  Alcohol use No  . Drug use: No  . Sexual activity: Not on file   Other Topics Concern  . Not on file   Social History Narrative  . No narrative on file   ROS-see HPI    "+" = pos Constitutional:    weight loss, night sweats, fevers, chills, +fatigue, lassitude. HEENT:    headaches, difficulty swallowing, tooth/dental problems, sore throat,       sneezing, itching, ear ache, nasal congestion, post nasal drip, snoring CV:    chest pain, orthopnea, PND, swelling in lower extremities, anasarca,                                                          dizziness, palpitations Resp:   shortness of breath with exertion or at rest.                productive cough,   non-productive cough, coughing up of blood.              change in color of mucus.  wheezing.   Skin:    rash or lesions. GI:  No-   heartburn, indigestion, abdominal pain, nausea, vomiting, diarrhea,                 change in bowel habits, loss of appetite GU: dysuria, change in color of urine, no urgency or frequency.   flank pain. MS:   +joint pain, stiffness, decreased range of motion, back pain. Neuro-     nothing unusual Psych:  change in mood or affect.  depression or anxiety.   memory loss.  OBJ- Physical Exam   + power wheelchair and tank portable oxygen at 5 L for saturation 95% General- Alert, Oriented, Affect-appropriate, Distress- none acute, +morbid obesity  Skin- rash-none, lesions- none, excoriation- none Lymphadenopathy- none Head- atraumatic            Eyes- Gross vision intact, PERRLA, conjunctivae and secretions clear            Ears- Hearing, canals-normal            Nose- + turbinate edema, no-Septal dev, mucus, polyps, erosion, perforation             Throat- Mallampati IV, mucosa clear , drainage- none, tonsils- atrophic Neck- flexible , trachea midline, no stridor , thyroid nl, carotid no bruit Chest - symmetrical excursion , unlabored           Heart/CV- RRR , no murmur , no gallop  , no rub, nl s1  s2                           - JVD- none , edema- none, stasis changes- none, varices- none  Lung- clear to P&A, wheeze- none, cough- none , dullness-none, rub- none           Chest wall-  Abd-  Br/ Gen/ Rectal- Not done, not indicated Extrem- cyanosis- none, clubbing, none, atrophy- none, strength- nl Neuro- grossly intact to observation

## 2016-06-12 NOTE — Assessment & Plan Note (Signed)
Tentative diagnosis with high probability based on exam and history. Plan-unattended home sleep test

## 2016-06-12 NOTE — Assessment & Plan Note (Signed)
Discharge summary does not indicate an acute cardiac event at hospitalization. Decompensation was likely viral on COPD/obesity hypoventilation. We will need to reassess need for ongoing oxygen therapy. This can start by review of overnight oximetry during home sleep test on room air

## 2016-06-12 NOTE — Assessment & Plan Note (Signed)
She denies wheeze or cough. Currently a don't think she could transfer well to get into a plethysmograph for formal PFT testing. Office spirometry indicates component of obstructive airways disease which would relate to her remote smoking history and contributing to her oxygen dependence. Plan-we will need to explore response to bronchodilators at a future visit.

## 2016-06-12 NOTE — Patient Instructions (Signed)
Order- schedule unattended home sleep test   Dx OSA      This will be done on room air  Order- office spirometry    Dx chronic hypoxic respiratory failure  Consider trying the Breath Right nasal strips to help stuffy nose when needed  Please call as needed

## 2016-06-12 NOTE — Assessment & Plan Note (Addendum)
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 She gets very little exercise. If appropriate, primary care might consider help  her husband direct her into a [employer] program or similar.

## 2016-06-19 DIAGNOSIS — L820 Inflamed seborrheic keratosis: Secondary | ICD-10-CM | POA: Diagnosis not present

## 2016-06-19 DIAGNOSIS — L400 Psoriasis vulgaris: Secondary | ICD-10-CM | POA: Diagnosis not present

## 2016-06-19 DIAGNOSIS — L821 Other seborrheic keratosis: Secondary | ICD-10-CM | POA: Diagnosis not present

## 2016-06-19 DIAGNOSIS — Z85828 Personal history of other malignant neoplasm of skin: Secondary | ICD-10-CM | POA: Diagnosis not present

## 2016-06-19 DIAGNOSIS — D225 Melanocytic nevi of trunk: Secondary | ICD-10-CM | POA: Diagnosis not present

## 2016-06-19 DIAGNOSIS — D485 Neoplasm of uncertain behavior of skin: Secondary | ICD-10-CM | POA: Diagnosis not present

## 2016-07-06 DIAGNOSIS — J9601 Acute respiratory failure with hypoxia: Secondary | ICD-10-CM | POA: Diagnosis not present

## 2016-07-20 DIAGNOSIS — G4733 Obstructive sleep apnea (adult) (pediatric): Secondary | ICD-10-CM | POA: Diagnosis not present

## 2016-07-22 ENCOUNTER — Ambulatory Visit: Payer: Medicare Other | Admitting: Internal Medicine

## 2016-07-23 DIAGNOSIS — G4733 Obstructive sleep apnea (adult) (pediatric): Secondary | ICD-10-CM | POA: Diagnosis not present

## 2016-07-25 ENCOUNTER — Other Ambulatory Visit: Payer: Self-pay | Admitting: Internal Medicine

## 2016-07-25 ENCOUNTER — Other Ambulatory Visit: Payer: Self-pay | Admitting: *Deleted

## 2016-07-25 DIAGNOSIS — G4733 Obstructive sleep apnea (adult) (pediatric): Secondary | ICD-10-CM

## 2016-07-31 ENCOUNTER — Ambulatory Visit (INDEPENDENT_AMBULATORY_CARE_PROVIDER_SITE_OTHER): Payer: Medicare Other | Admitting: Internal Medicine

## 2016-07-31 ENCOUNTER — Encounter: Payer: Self-pay | Admitting: Internal Medicine

## 2016-07-31 DIAGNOSIS — J449 Chronic obstructive pulmonary disease, unspecified: Secondary | ICD-10-CM

## 2016-07-31 DIAGNOSIS — R0902 Hypoxemia: Secondary | ICD-10-CM

## 2016-07-31 DIAGNOSIS — G4733 Obstructive sleep apnea (adult) (pediatric): Secondary | ICD-10-CM

## 2016-07-31 NOTE — Patient Instructions (Addendum)
Order- DME [facility]  New CPAP auto 5-53m mask of choice, humidifier, supplies, AirView    Dx OSA                      Bleed in her O2  5L for sleep                       [facility]- please evaluate for small portable Oxygen systems 3 L    Dx chronic hypoxic respiratory failure   Please call as needed

## 2016-07-31 NOTE — Progress Notes (Signed)
HPI female former smoker followed for OSA, complicated by chronic hypoxic/ hypercapneic respiratory failure, depression, HBP, GERD,  Office Spirometry 06/12/2016-moderate restriction of exhaled volume, moderate to severe obstructive airways disease-FVC 1.80/63%, FEV1 1.29/59%, ratio 0.71, FEF 25-75% 0.87/45%. morbid obesity, obesity/hypoventilation syndrome Unattended Home Sleep Test 07/20/16-AHI 28.4/hour, desaturation to 42% with average saturation 75%, body weight 279 pounds FOLLOWS FOR: Review HST from 07-18-16  --------------------------------------------------------------------------------------------  06/12/2016-66 year old female former smoker referred courtesy of Dr Burnice Logan; Never had sleep study. Pt's husband states patient snores. Denies any waking up gasping for air. Pt uses O2. O2 2-4L/ [facility] Medical problems include respiratory failure with hypoxia and hypercapnea, depression, HBP, GERD, morbid obesity, obesity hypoventilation syndrome She had lost 20 pounds of water weight when hospitalized for hypoxic respiratory failure this winter, then discharged on home oxygen. 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 Husband says she still snores. She admits some daytime sleepiness but says she can stay awake if she wants to. ENT surgery-tonsils and adenoids, allergic rhinitis treated with Flonase but with residual nasal stuffiness  Epworth sleepiness score 7/24 Office Spirometry 06/12/2016-moderate restriction of exhaled volume, moderate to severe obstructive airways disease-FVC 1.80/63%, FEV1 1.29/59%, ratio 0.71, FEF 25-75% 0.87/45%.  07/31/16- 66 year old female former smoker followed for OSA, complicated by chronic hypoxic/ hypercapneic respiratory failure, depression, HBP, GERD, morbid obesity, obesity/hypoventilation syndrome O2 2-4 L/ [facility]                   Husband here Unattended Home Sleep Test 07/20/16-AHI 28.4/hour,  desaturation to 42% with average saturation 75%, body weight 279 pounds FOLLOWS FOR: Review HST from 07-18-16 We reviewed her home sleep test, discussing significance of obstructive sleep apnea and the difference between CPAP and oxygen therapy. She understands the importance of maintaining normal weight. We reviewed treatment options in detail. She considers management of her COPD "fine" using Symbicort. Little routine sputum. No chest pain or palpitation  ROS-see HPI    "+" = pos Constitutional:    weight loss, night sweats, fevers, chills, +fatigue, lassitude. HEENT:    headaches, difficulty swallowing, tooth/dental problems, sore throat,       sneezing, itching, ear ache, nasal congestion, post nasal drip, snoring CV:    chest pain, orthopnea, PND, swelling in lower extremities, anasarca,                                                          dizziness, palpitations Resp:   +shortness of breath with exertion or at rest.                productive cough,   non-productive cough, coughing up of blood.              change in color of mucus.  wheezing.   Skin:    rash or lesions. GI:  No-   heartburn, indigestion, abdominal pain, nausea, vomiting, diarrhea,                 change in bowel habits, loss of appetite GU: dysuria, change in color of urine, no urgency or frequency.   flank pain. MS:   +joint pain, stiffness, decreased range of motion, back pain. Neuro-     nothing unusual Psych:  change in mood or affect.  depression or  anxiety.   memory loss.  OBJ- Physical Exam                 + power wheelchair and RA saturation 92% General- Alert, Oriented, Affect-appropriate, Distress- none acute, +morbid obesity  Skin- rash-none, lesions- none, excoriation- none Lymphadenopathy- none Head- atraumatic            Eyes- Gross vision intact, PERRLA, conjunctivae and secretions clear            Ears- Hearing, canals-normal            Nose- + turbinate edema, no-Septal dev, mucus, polyps,  erosion, perforation             Throat- Mallampati IV, mucosa clear , drainage- none, tonsils- atrophic Neck- flexible , trachea midline, no stridor , thyroid nl, carotid no bruit Chest - symmetrical excursion , unlabored           Heart/CV- RRR , no murmur , no gallop  , no rub, nl s1 s2                           - JVD- none , edema- none, stasis changes- none, varices- none           Lung- clear to P&A, wheeze- none, cough- none , dullness-none, rub- none           Chest wall-  Abd-  Br/ Gen/ Rectal- Not done, not indicated Extrem- cyanosis- none, clubbing, none, atrophy- none, strength- nl Neuro- grossly intact to observation

## 2016-08-01 NOTE — Assessment & Plan Note (Signed)
Oxygenating better today on room air at rest and last visit. We will continue to track this

## 2016-08-01 NOTE — Assessment & Plan Note (Signed)
Body habitus quite consistent with obesity hypoventilation syndrome. This, deconditioning and COPD all contributing to her dyspnea on exertion. Not much benefit to be expected from bronchodilators. It would help much more if she can follow my coaching towards an attempt at weight loss per

## 2016-08-01 NOTE — Assessment & Plan Note (Signed)
Moderate to severe obstructive airways disease. Minimal reversible component. We discussed alternatives and she will continue Symbicort

## 2016-08-01 NOTE — Assessment & Plan Note (Signed)
She is an appropriate candidate for CPAP as discussed carefully. Sure he sleeps with oxygen and is likely addicted to continue oxygen in addition to CPAP. We will start with auto titration.

## 2016-08-05 DIAGNOSIS — J9601 Acute respiratory failure with hypoxia: Secondary | ICD-10-CM | POA: Diagnosis not present

## 2016-09-05 DIAGNOSIS — J9601 Acute respiratory failure with hypoxia: Secondary | ICD-10-CM | POA: Diagnosis not present

## 2016-09-12 ENCOUNTER — Other Ambulatory Visit: Payer: Self-pay | Admitting: Internal Medicine

## 2016-09-13 ENCOUNTER — Other Ambulatory Visit: Payer: Self-pay | Admitting: Internal Medicine

## 2016-09-23 ENCOUNTER — Ambulatory Visit: Payer: Medicare Other | Admitting: Internal Medicine

## 2016-09-26 ENCOUNTER — Ambulatory Visit (INDEPENDENT_AMBULATORY_CARE_PROVIDER_SITE_OTHER): Payer: Medicare Other | Admitting: Internal Medicine

## 2016-09-26 ENCOUNTER — Encounter: Payer: Self-pay | Admitting: Internal Medicine

## 2016-09-26 VITALS — BP 126/78 | HR 86 | Temp 97.8°F | Ht 62.0 in | Wt 273.4 lb

## 2016-09-26 DIAGNOSIS — I10 Essential (primary) hypertension: Secondary | ICD-10-CM | POA: Diagnosis not present

## 2016-09-26 DIAGNOSIS — L409 Psoriasis, unspecified: Secondary | ICD-10-CM

## 2016-09-26 DIAGNOSIS — K219 Gastro-esophageal reflux disease without esophagitis: Secondary | ICD-10-CM

## 2016-09-26 DIAGNOSIS — E785 Hyperlipidemia, unspecified: Secondary | ICD-10-CM

## 2016-09-26 DIAGNOSIS — Z0000 Encounter for general adult medical examination without abnormal findings: Secondary | ICD-10-CM | POA: Diagnosis not present

## 2016-09-26 DIAGNOSIS — G4733 Obstructive sleep apnea (adult) (pediatric): Secondary | ICD-10-CM

## 2016-09-26 DIAGNOSIS — J449 Chronic obstructive pulmonary disease, unspecified: Secondary | ICD-10-CM | POA: Diagnosis not present

## 2016-09-26 LAB — CBC WITH DIFFERENTIAL/PLATELET
BASOS PCT: 1 % (ref 0.0–3.0)
Basophils Absolute: 0.1 10*3/uL (ref 0.0–0.1)
EOS PCT: 2 % (ref 0.0–5.0)
Eosinophils Absolute: 0.2 10*3/uL (ref 0.0–0.7)
HEMATOCRIT: 41.5 % (ref 36.0–46.0)
HEMOGLOBIN: 13.8 g/dL (ref 12.0–15.0)
Lymphocytes Relative: 22.6 % (ref 12.0–46.0)
Lymphs Abs: 1.9 10*3/uL (ref 0.7–4.0)
MCHC: 33.3 g/dL (ref 30.0–36.0)
MCV: 91.7 fl (ref 78.0–100.0)
MONO ABS: 0.5 10*3/uL (ref 0.1–1.0)
MONOS PCT: 5.8 % (ref 3.0–12.0)
Neutro Abs: 5.9 10*3/uL (ref 1.4–7.7)
Neutrophils Relative %: 68.6 % (ref 43.0–77.0)
Platelets: 363 10*3/uL (ref 150.0–400.0)
RBC: 4.53 Mil/uL (ref 3.87–5.11)
RDW: 14 % (ref 11.5–15.5)
WBC: 8.5 10*3/uL (ref 4.0–10.5)

## 2016-09-26 LAB — COMPREHENSIVE METABOLIC PANEL
ALBUMIN: 4 g/dL (ref 3.5–5.2)
ALT: 9 U/L (ref 0–35)
AST: 10 U/L (ref 0–37)
Alkaline Phosphatase: 112 U/L (ref 39–117)
BUN: 18 mg/dL (ref 6–23)
CHLORIDE: 97 meq/L (ref 96–112)
CO2: 39 mEq/L — ABNORMAL HIGH (ref 19–32)
Calcium: 9.6 mg/dL (ref 8.4–10.5)
Creatinine, Ser: 0.78 mg/dL (ref 0.40–1.20)
GFR: 78.22 mL/min (ref >60.00)
GLUCOSE: 114 mg/dL — AB (ref 70–99)
Potassium: 3.9 mEq/L (ref 3.5–5.1)
SODIUM: 144 meq/L (ref 135–145)
Total Bilirubin: 0.4 mg/dL (ref 0.2–1.2)
Total Protein: 7.3 g/dL (ref 6.0–8.3)

## 2016-09-26 LAB — TSH: TSH: 4.05 u[IU]/mL (ref 0.35–4.50)

## 2016-09-26 LAB — LIPID PANEL
Cholesterol: 196 mg/dL (ref 0–200)
HDL: 33.4 mg/dL — ABNORMAL LOW (ref >39.00)
LDL CALC: 133 mg/dL — AB (ref 0–99)
NONHDL: 163.09
Total CHOL/HDL Ratio: 6
Triglycerides: 150 mg/dL — ABNORMAL HIGH (ref 0.0–149.0)
VLDL: 30 mg/dL (ref 0.0–40.0)

## 2016-09-26 NOTE — Progress Notes (Signed)
Subjective:    Patient ID: Laurie Dominguez, female    DOB: Nov 10, 1950, 66 y.o.   MRN: [id number]  HPI  66 year old patient who is seen today for a preventive health examination and subsequent Medicare annual wellness visit. Her main concern today is psoriasis.  She is considering that the tracks 8 therapy from her dermatologist. She has a history of essential hypertension. She has been followed by pulmonary medicine with obesity hypoventilation syndrome.  This has improved with weight loss and os and requirements have also improved.  Past Medical History:  Diagnosis Date  . Colon polyp 08/16/2004   Hyperplastic  . Depression   . GERD (gastroesophageal reflux disease)   . Hypertension   . Nephrolithiasis   . Obesity      Social History   Social History  . Marital status: Married    Spouse name: N/A  . Number of children: N/A  . Years of education: N/A   Occupational History  . Not on file.   Social History Main Topics  . Smoking status: Former Smoker    Packs/day: 2.00    Years: 40.00    Types: Cigarettes    Quit date: 03/24/2006  . Smokeless tobacco: Never Used  . Alcohol use No  . Drug use: No  . Sexual activity: Not on file   Other Topics Concern  . Not on file   Social History Narrative  . No narrative on file    Past Surgical History:  Procedure Laterality Date  . CHOLECYSTECTOMY    . DILATION AND CURETTAGE OF UTERUS    . TONSILLECTOMY      Family History  Problem Relation Age of Onset  . Cancer Mother        unknown primary  . Liver cancer Mother        unknown primary  . Other Father        spinal cord tumor    Allergies  Allergen Reactions  . Codeine Phosphate     Current Outpatient Prescriptions on File Prior to Visit  Medication Sig Dispense Refill  . albuterol (PROVENTIL HFA;VENTOLIN HFA) 108 (90 Base) MCG/ACT inhaler Inhale 2 puffs into the lungs every 4 (four) hours as needed for wheezing or shortness of breath. 1 Inhaler 1  .  amLODipine (NORVASC) 5 MG tablet Take 1 tablet by mouth  daily 90 tablet 3  . budesonide-formoterol (SYMBICORT) 160-4.5 MCG/ACT inhaler Inhale 2 puffs into the lungs 2 (two) times daily. 1 Inhaler 1  . diazepam (VALIUM) 2 MG tablet Take 1 tablet (2 mg total) by mouth at bedtime as needed. (Patient taking differently: Take 2 mg by mouth at bedtime as needed for anxiety or muscle spasms. ) 90 tablet 1  . furosemide (LASIX) 20 MG tablet TAKE 1 TABLET BY MOUTH  DAILY 30 tablet 5  . losartan-hydrochlorothiazide (HYZAAR) 100-25 MG tablet TAKE 1 TABLET BY MOUTH  DAILY 90 tablet 0  . meclizine (ANTIVERT) 25 MG tablet Take 1 tablet (25 mg total) by mouth daily as needed. (Patient taking differently: Take 25 mg by mouth 2 (two) times daily as needed for dizziness or nausea. ) 90 tablet 1  . naproxen (NAPROSYN) 500 MG tablet TAKE 1 TABLET BY MOUTH TWO  TIMES DAILY WITH MEALS 180 tablet 1  . omeprazole (PRILOSEC) 20 MG capsule Take 1 capsule by mouth  every day 90 capsule 3  . PARoxetine (PAXIL) 30 MG tablet Take 1 tablet (30 mg total) by mouth every morning.    Marland Kitchen  traMADol (ULTRAM) 50 MG tablet Take 1 tablet (50 mg total) by mouth every 6 (six) hours as needed. (Patient taking differently: Take 50 mg by mouth every 6 (six) hours as needed for moderate pain or severe pain. ) 180 tablet 1  . fluticasone (FLONASE) 50 MCG/ACT nasal spray Place 2 sprays into both nostrils daily. 1 g 0   No current facility-administered medications on file prior to visit.     BP 126/78 (BP Location: Left Arm, Patient Position: Sitting, Cuff Size: Large)   Pulse 86   Temp 97.8 F (36.6 C) (Oral)   Ht 5\' 2"  (1.575 m)   Wt 273 lb 6.4 oz (124 kg)   SpO2 95%   BMI 50.01 kg/m   Medicare wellness visit  1. Risk factors, based on past  M,S,F history.  Current vascular risk factors include a history of hypertension  2.  Physical activities: very sedentary due to obesity and COPD.  Uses a walker  3.  Depression/mood:history of  depression, which has been stable.  Remains on paroxetine  4.  Hearing:no deficits  5.  ADL's:independent  6.  Fall risk:moderately high due to obesity and unsteady gait.  Does use a walker or wheelchair  7.  Home safety:no problems identified  8.  Height weight, and visual acuity;height and weight stable no change in visual acuity  9.  Counseling:continue efforts at weight loss  10. Lab orders based on risk factors:laboratory studies reviewed  11. Referral :follow-up dermatology and pulmonary medicine  12. Care plan: continue efforts at weight loss and risk factor modification  13. Cognitive assessment: alert and oriented with normal affect.  No cognitive dysfunction  14. Screening: Patient provided with a written and personalized 5-10 year screening schedule in the AVS.    15. Provider List Update: pulmonary dermatology primary care and GI     Review of Systems  Constitutional: Negative.   HENT: Negative for congestion, dental problem, hearing loss, rhinorrhea, sinus pressure, sore throat and tinnitus.   Eyes: Negative for pain, discharge and visual disturbance.  Respiratory: Negative for cough and shortness of breath.   Cardiovascular: Negative for chest pain, palpitations and leg swelling.  Gastrointestinal: Negative for abdominal distention, abdominal pain, blood in stool, constipation, diarrhea, nausea and vomiting.  Genitourinary: Negative for difficulty urinating, dysuria, flank pain, frequency, hematuria, pelvic pain, urgency, vaginal bleeding, vaginal discharge and vaginal pain.  Musculoskeletal: Positive for back pain and gait problem. Negative for arthralgias and joint swelling.  Skin: Positive for rash.  Neurological: Negative for dizziness, syncope, speech difficulty, weakness, numbness and headaches.  Hematological: Negative for adenopathy.  Psychiatric/Behavioral: Negative for agitation, behavioral problems and dysphoric mood. The patient is not  nervous/anxious.        Objective:   Physical Exam  Constitutional: She is oriented to person, place, and time. She appears well-developed and well-nourished. No distress.  Blood pressure 126/78  Weight 273  HENT:  Head: Normocephalic and atraumatic.  Right Ear: External ear normal.  Left Ear: External ear normal.  Mouth/Throat: Oropharynx is clear and moist.  Pharyngeal crowding  Eyes: Conjunctivae and EOM are normal.  Neck: Normal range of motion. Neck supple. No JVD present. No thyromegaly present.  Cardiovascular: Normal rate, regular rhythm and intact distal pulses.   Murmur heard. Grade 7-3/4 systolic murmur loudest at the primary aortic area  Pulmonary/Chest: Effort normal and breath sounds normal. She has no wheezes. She has no rales.  O2 saturation 95%  Abdominal: Soft. Bowel sounds are normal. She  exhibits no distension and no mass. There is no tenderness. There is no rebound and no guarding.   Obese Small umbilical hernia   Genitourinary: Vagina normal.  Musculoskeletal: Normal range of motion. She exhibits no edema or tenderness.  Neurological: She is alert and oriented to person, place, and time. She has normal reflexes. No cranial nerve deficit. She exhibits normal muscle tone. Coordination normal.  Skin: Skin is warm and dry. No rash noted.  Scattered psoriatic skin lesions most marked over the lower extremities  Psychiatric: She has a normal mood and affect. Her behavior is normal.          Assessment & Plan:   Preventive health examination Subsequent Medicare wellness visit Psoriasis.  Follow-up dermatology.  Will check CBC and electrolytes.  Patient is considering methotrexate therapy Essential hypertension, stable Obesity hypoventilation syndrome.  Improved with less oxygen requirements.  Continue efforts at weight loss COPD Impaired glucose tolerance.  We'll review a fasting blood sugar  Follow-up 3-6 months  [id number],PETER Pilar Plate

## 2016-09-26 NOTE — Patient Instructions (Signed)
Limit your sodium (Salt) intake  Please check your blood pressure on a regular basis.  If it is consistently greater than 150/90, please make an office appointment.  Return in 6 months for follow-up  Continue your great efforts at weight loss!!

## 2016-10-05 DIAGNOSIS — J9601 Acute respiratory failure with hypoxia: Secondary | ICD-10-CM | POA: Diagnosis not present

## 2016-10-31 ENCOUNTER — Other Ambulatory Visit: Payer: Self-pay | Admitting: Internal Medicine

## 2016-10-31 ENCOUNTER — Ambulatory Visit (INDEPENDENT_AMBULATORY_CARE_PROVIDER_SITE_OTHER): Payer: Medicare Other | Admitting: Internal Medicine

## 2016-10-31 DIAGNOSIS — G4733 Obstructive sleep apnea (adult) (pediatric): Secondary | ICD-10-CM

## 2016-10-31 DIAGNOSIS — J9611 Chronic respiratory failure with hypoxia: Secondary | ICD-10-CM

## 2016-10-31 NOTE — Addendum Note (Signed)
Addended by: Clayborne Dana C on: 10/31/2016 11:45 AM   Modules accepted: Orders

## 2016-11-05 DIAGNOSIS — J9601 Acute respiratory failure with hypoxia: Secondary | ICD-10-CM | POA: Diagnosis not present

## 2016-11-11 DIAGNOSIS — J9601 Acute respiratory failure with hypoxia: Secondary | ICD-10-CM | POA: Diagnosis not present

## 2016-11-11 DIAGNOSIS — G4733 Obstructive sleep apnea (adult) (pediatric): Secondary | ICD-10-CM | POA: Diagnosis not present

## 2016-11-12 DIAGNOSIS — H35033 Hypertensive retinopathy, bilateral: Secondary | ICD-10-CM | POA: Diagnosis not present

## 2016-11-12 DIAGNOSIS — I10 Essential (primary) hypertension: Secondary | ICD-10-CM | POA: Diagnosis not present

## 2016-11-12 DIAGNOSIS — H25819 Combined forms of age-related cataract, unspecified eye: Secondary | ICD-10-CM | POA: Diagnosis not present

## 2016-11-16 ENCOUNTER — Other Ambulatory Visit: Payer: Self-pay | Admitting: Internal Medicine

## 2016-11-20 ENCOUNTER — Telehealth: Payer: Self-pay | Admitting: Internal Medicine

## 2016-11-20 ENCOUNTER — Other Ambulatory Visit: Payer: Self-pay | Admitting: Internal Medicine

## 2016-11-20 MED ORDER — LOSARTAN POTASSIUM-HCTZ 100-25 MG PO TABS: 1.0000 | ORAL_TABLET | Freq: Every day | ORAL | 3 refills | Status: DC

## 2016-11-20 NOTE — Telephone Encounter (Signed)
Pt need new Rx for losartan-hydrochlorothiazide #90, naproxen #90 and omeprazole #90  Pharm:  [facility]  Pt is out of her medication (losartan,naproxen and omeprazole) and would like to see if #10 could be called in to [facility] in [place].

## 2016-11-21 MED ORDER — LOSARTAN POTASSIUM-HCTZ 100-25 MG PO TABS: 1.0000 | ORAL_TABLET | Freq: Every day | ORAL | 3 refills | Status: DC

## 2016-11-21 MED ORDER — NAPROXEN 500 MG PO TABS: ORAL_TABLET | ORAL | 3 refills | Status: DC

## 2016-11-21 MED ORDER — OMEPRAZOLE 20 MG PO CPDR: DELAYED_RELEASE_CAPSULE | ORAL | 0 refills | Status: DC

## 2016-11-21 MED ORDER — OMEPRAZOLE 20 MG PO CPDR: DELAYED_RELEASE_CAPSULE | ORAL | 3 refills | Status: DC

## 2016-11-21 MED ORDER — NAPROXEN 500 MG PO TABS: ORAL_TABLET | ORAL | 0 refills | Status: DC

## 2016-11-21 MED ORDER — LOSARTAN POTASSIUM-HCTZ 100-25 MG PO TABS: 1.0000 | ORAL_TABLET | Freq: Every day | ORAL | 0 refills | Status: DC

## 2016-11-21 NOTE — Telephone Encounter (Signed)
Rx were refilled.

## 2016-12-06 DIAGNOSIS — J9601 Acute respiratory failure with hypoxia: Secondary | ICD-10-CM | POA: Diagnosis not present

## 2017-01-05 DIAGNOSIS — J9601 Acute respiratory failure with hypoxia: Secondary | ICD-10-CM | POA: Diagnosis not present

## 2017-02-02 ENCOUNTER — Encounter: Payer: Self-pay | Admitting: Internal Medicine

## 2017-02-02 ENCOUNTER — Other Ambulatory Visit: Payer: Self-pay | Admitting: Internal Medicine

## 2017-02-02 ENCOUNTER — Ambulatory Visit (INDEPENDENT_AMBULATORY_CARE_PROVIDER_SITE_OTHER): Payer: Medicare Other | Admitting: Internal Medicine

## 2017-02-02 ENCOUNTER — Telehealth: Payer: Self-pay | Admitting: Internal Medicine

## 2017-02-02 VITALS — BP 118/68 | HR 85 | Ht 62.0 in | Wt 265.0 lb

## 2017-02-02 DIAGNOSIS — G4733 Obstructive sleep apnea (adult) (pediatric): Secondary | ICD-10-CM

## 2017-02-02 DIAGNOSIS — J9611 Chronic respiratory failure with hypoxia: Secondary | ICD-10-CM | POA: Diagnosis not present

## 2017-02-02 NOTE — Patient Instructions (Signed)
Order- referral to daytime sleep center tech for CPAP mask fitting and desensitization  Continue CPAP auto 5-20, mask of choice, humidifier, supplies, AirView, O2 5L bleed- in for sleep    Dx OSA  Please call as needed

## 2017-02-02 NOTE — Progress Notes (Signed)
HPI female former smoker followed for OSA, complicated by chronic hypoxic/ hypercapneic respiratory failure, depression, HBP, GERD,  Office Spirometry 06/12/2016-moderate restriction of exhaled volume, moderate to severe obstructive airways disease-FVC 1.80/63%, FEV1 1.29/59%, ratio 0.71, FEF 25-75% 0.87/45%. morbid obesity, obesity/hypoventilation syndrome Unattended Home Sleep Test 07/20/16-AHI 28.4/hour, desaturation to 42% with average saturation 75%, body weight 279 pounds --------------------------------------------------------------------------------------------  07/31/16- 66 year old female former smoker followed for OSA, complicated by chronic hypoxic/ hypercapneic respiratory failure, depression, HBP, GERD, morbid obesity, obesity/hypoventilation syndrome O2 2-4 L/ [facility]                   Husband here Unattended Home Sleep Test 07/20/16-AHI 28.4/hour, desaturation to 42% with average saturation 75%, body weight 279 pounds FOLLOWS FOR: Review HST from 07-18-16 We reviewed her home sleep test, discussing significance of obstructive sleep apnea and the difference between CPAP and oxygen therapy. She understands the importance of maintaining normal weight. We reviewed treatment options in detail. She considers management of her COPD "fine" using Symbicort. Little routine sputum. No chest pain or palpitation  02/02/17- 66 year old female former smoker followed for OSA, complicated by chronic hypoxic/ hypercapneic respiratory failure, depression, HBP, GERD, morbid obesity, obesity/hypoventilation syndrome O2 2-4 L sleep/ [facility]                   Husband here OSA; Pt did not set up CPAP through [facility]. Pt had issues with [facility] for set up and had trouble with leaks, etc.  Symbicort 160, albuterol HFA She went to a CPAP class at [facility].  Could not tolerate full facemask because of leaks so she went back to [facility].  She feels she sleeps well enough using oxygen alone at 4-5 L.  ROS-see HPI     "+" = pos Constitutional:    weight loss, night sweats, fevers, chills, +fatigue, lassitude. HEENT:    headaches, difficulty swallowing, tooth/dental problems, sore throat,       sneezing, itching, ear ache, nasal congestion, post nasal drip, snoring CV:    chest pain, orthopnea, PND, swelling in lower extremities, anasarca,                                                        dizziness, palpitations Resp:   +shortness of breath with exertion or at rest.                productive cough,   non-productive cough, coughing up of blood.              change in color of mucus.  wheezing.   Skin:    rash or lesions. GI:  No-   heartburn, indigestion, abdominal pain, nausea, vomiting, diarrhea,                 change in bowel habits, loss of appetite GU: dysuria, change in color of urine, no urgency or frequency.   flank pain. MS:   +joint pain, stiffness, decreased range of motion, back pain. Neuro-     nothing unusual Psych:  change in mood or affect.  depression or anxiety.   memory loss.  OBJ- Physical Exam                 + power wheelchair, room air saturation 94% General- Alert, Oriented, Affect-appropriate, Distress- none acute, +morbid obesity  Skin- rash-none, lesions- none, excoriation- none Lymphadenopathy- none Head- atraumatic            Eyes- Gross vision intact, PERRLA, conjunctivae and secretions clear            Ears- Hearing, canals-normal            Nose- + turbinate edema, no-Septal dev, mucus, polyps, erosion, perforation             Throat- Mallampati IV, mucosa clear , drainage- none, tonsils- atrophic Neck- flexible , trachea midline, no stridor , thyroid nl, carotid no bruit Chest - symmetrical excursion , unlabored           Heart/CV- RRR , no murmur , no gallop  , no rub, nl s1 s2                           - JVD- none , edema- none, stasis changes- none, varices- none           Lung- clear to P&A, wheeze- none, cough- none , dullness-none, rub- none           Chest  wall-  Abd-  Br/ Gen/ Rectal- Not done, not indicated Extrem- cyanosis- none, clubbing, none, atrophy- none, strength- nl Neuro- grossly intact to observation

## 2017-02-02 NOTE — Telephone Encounter (Signed)
I didn't understand from her today that she had turned machine in.  If she is willing, we need to order a new Home Sleep Test for dx OSA She had wanted new study. I don't know if insurance  will pay this soon after previous test- We will have to see.

## 2017-02-02 NOTE — Telephone Encounter (Signed)
Spoke with pt, spoke with [name] and she returned her CPAP in September and this is beyond 30 days that insurance requires regarding compliance. She has to start over and a home sleep test has to be done in order to restart CPAP therapy. Can we order a sleep test for pt? [name] states he can use the previous notes but needs new sleep study. CY please advise.

## 2017-02-02 NOTE — Telephone Encounter (Signed)
Called pt and advised message from the provider. Pt understood and verbalized understanding. Nothing further is needed.    Called [name] to let him know. Nothing further is needed.

## 2017-02-04 DIAGNOSIS — J9611 Chronic respiratory failure with hypoxia: Secondary | ICD-10-CM | POA: Insufficient documentation

## 2017-02-04 NOTE — Assessment & Plan Note (Signed)
We are going to ask the [facility] to work with her mask fit.  If they cannot, then we will send her for daytime sleep tech desensitization and mask fitting.

## 2017-02-04 NOTE — Assessment & Plan Note (Signed)
Likely problems are obesity-hypoventilation and OSA She continues to need oxygen for sleep along with CPAP.  Weight loss would help.

## 2017-02-05 DIAGNOSIS — J9601 Acute respiratory failure with hypoxia: Secondary | ICD-10-CM | POA: Diagnosis not present

## 2017-02-10 ENCOUNTER — Telehealth: Payer: Self-pay | Admitting: Family Medicine

## 2017-02-10 NOTE — Telephone Encounter (Signed)
Copied from [place] [id number]. Topic: Inquiry >> Feb 10, 2017  1:02 PM Neva Seat wrote: [facility] - [phone]  Verbal Approval -   Tramadol 15mg   Ref #[id number]

## 2017-02-10 NOTE — Telephone Encounter (Signed)
Verbal orders for refill were given to pharmacist for Tramadol 50 mg one tab PO Q6H prn #180 with 1 refill.

## 2017-02-16 ENCOUNTER — Telehealth: Payer: Self-pay | Admitting: Family Medicine

## 2017-02-16 NOTE — Telephone Encounter (Signed)
Please see below message, please advise.

## 2017-02-16 NOTE — Telephone Encounter (Signed)
Spoke Laurie Dominguez pharmacist with [facility] and informed him that it is okay to fill medication .  Patient has taken this medication without difficulty or side effects.

## 2017-02-16 NOTE — Telephone Encounter (Signed)
Yes, okay to refill.  Patient has taken this medication without difficulty or side effects

## 2017-02-16 NOTE — Telephone Encounter (Signed)
[facility] called about Ultram prescription , drug allergy alert with Codeine, want to make sure its okay to fill and requesting a call back. [phone] reference number [id number]

## 2017-03-02 ENCOUNTER — Other Ambulatory Visit (HOSPITAL_BASED_OUTPATIENT_CLINIC_OR_DEPARTMENT_OTHER): Payer: Medicare Other

## 2017-03-07 DIAGNOSIS — J9601 Acute respiratory failure with hypoxia: Secondary | ICD-10-CM | POA: Diagnosis not present

## 2017-03-10 ENCOUNTER — Other Ambulatory Visit (HOSPITAL_BASED_OUTPATIENT_CLINIC_OR_DEPARTMENT_OTHER): Payer: Medicare Other

## 2017-03-23 ENCOUNTER — Encounter: Payer: Self-pay | Admitting: Family Medicine

## 2017-03-23 ENCOUNTER — Ambulatory Visit: Payer: Medicare Other | Admitting: Family Medicine

## 2017-03-23 VITALS — BP 128/80 | HR 107 | Ht 62.0 in | Wt 263.0 lb

## 2017-03-23 DIAGNOSIS — I80299 Phlebitis and thrombophlebitis of other deep vessels of unspecified lower extremity: Secondary | ICD-10-CM | POA: Diagnosis not present

## 2017-03-23 NOTE — Progress Notes (Signed)
Subjective:  Patient ID: Laurie Dominguez, female    DOB: 09-28-1950  Age: 66 y.o. MRN: [id number]  CC: right knee pain and Back Pain   HPI Laurie Dominguez presents for evaluation of a linear density that she found in her posterior medial proximal right calf.  She has no history of phlebitis, DVT or known malignancy.  She quit smoking 10 years ago.  She has lost 40 pounds.  She has no chest pain shortness of breath or hemoptysis.  She has been treating this with occasional Naprosyn.  It really has not bothered her that much.  History Laurie Dominguez has a past medical history of Colon polyp (08/16/2004), Depression, GERD (gastroesophageal reflux disease), Hypertension, Nephrolithiasis, and Obesity.   She has a past surgical history that includes Cholecystectomy; Tonsillectomy; and Dilation and curettage of uterus.   Her family history includes Cancer in her mother; Liver cancer in her mother; Other in her father.She reports that she quit smoking about 11 years ago. Her smoking use included cigarettes. She has a 80.00 pack-year smoking history. she has never used smokeless tobacco. She reports that she does not drink alcohol or use drugs.  Outpatient Medications Prior to Visit  Medication Sig Dispense Refill  . albuterol (PROVENTIL HFA;VENTOLIN HFA) 108 (90 Base) MCG/ACT inhaler Inhale 2 puffs into the lungs every 4 (four) hours as needed for wheezing or shortness of breath. 1 Inhaler 1  . amLODipine (NORVASC) 5 MG tablet TAKE 1 TABLET BY MOUTH  DAILY 90 tablet 3  . budesonide-formoterol (SYMBICORT) 160-4.5 MCG/ACT inhaler Inhale 2 puffs into the lungs 2 (two) times daily. 1 Inhaler 1  . diazepam (VALIUM) 2 MG tablet Take 1 tablet (2 mg total) by mouth at bedtime as needed. (Patient taking differently: Take 2 mg by mouth at bedtime as needed for anxiety or muscle spasms. ) 90 tablet 1  . furosemide (LASIX) 20 MG tablet TAKE 1 TABLET BY MOUTH  DAILY 90 tablet 3  . losartan-hydrochlorothiazide (HYZAAR)  100-25 MG tablet Take 1 tablet by mouth daily. 90 tablet 3  . meclizine (ANTIVERT) 25 MG tablet Take 1 tablet (25 mg total) by mouth daily as needed. (Patient taking differently: Take 25 mg by mouth 2 (two) times daily as needed for dizziness or nausea. ) 90 tablet 1  . naproxen (NAPROSYN) 500 MG tablet TAKE 1 TABLET BY MOUTH TWO  TIMES DAILY WITH MEALS 180 tablet 3  . omeprazole (PRILOSEC) 20 MG capsule Take 1 capsule by mouth  every day 90 capsule 3  . PARoxetine (PAXIL) 30 MG tablet TAKE 2 TABLETS BY MOUTH  EVERY MORNING 180 tablet 1  . traMADol (ULTRAM) 50 MG tablet Take 1 tablet (50 mg total) by mouth every 6 (six) hours as needed. (Patient taking differently: Take 50 mg by mouth every 6 (six) hours as needed for moderate pain or severe pain. ) 180 tablet 1  . fluticasone (FLONASE) 50 MCG/ACT nasal spray Place 2 sprays into both nostrils daily. 1 g 0   No facility-administered medications prior to visit.     ROS Review of Systems  Constitutional: Negative for chills, fatigue and fever.  HENT: Negative.   Eyes: Negative.   Respiratory: Negative for chest tightness, shortness of breath and wheezing.   Cardiovascular: Negative for chest pain, palpitations and leg swelling.  Gastrointestinal: Negative.   Musculoskeletal: Negative for arthralgias and gait problem.  Skin: Negative for color change and rash.  Psychiatric/Behavioral: Negative.     Objective:  BP 128/80 (BP Location: Left  Arm, Patient Position: Sitting, Cuff Size: Large)   Pulse (!) 107   Ht 5\' 2"  (1.575 m)   Wt 263 lb (119.3 kg)   SpO2 91%   BMI 48.10 kg/m   Physical Exam  Constitutional: She is oriented to person, place, and time. She appears well-developed and well-nourished. No distress.  HENT:  Head: Normocephalic and atraumatic.  Right Ear: External ear normal.  Left Ear: External ear normal.  Eyes: Pupils are equal, round, and reactive to light. Right eye exhibits no discharge. Left eye exhibits no  discharge. No scleral icterus.  Cardiovascular:  Pulses:      Dorsalis pedis pulses are 1+ on the right side.       Posterior tibial pulses are 1+ on the right side.  Cap refill is < 2.sec.   Pulmonary/Chest: Effort normal.  Musculoskeletal:       Legs: Neurological: She is alert and oriented to person, place, and time.  Skin: Skin is warm and dry. She is not diaphoretic.  Psychiatric: She has a normal mood and affect. Her behavior is normal.   We discussed the treatment of phlebitis with elevation and heat and anti-inflammatories.  At this point she is a low risk for DVT other than her obesity.  We discussed what to look for with a pulmonary embolism.  He will RTC as needed.   Assessment & Plan:   Laurie Dominguez was seen today for right knee pain and back pain.  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  No orders of the defined types were placed in this encounter.    Follow-up: No Follow-up on file.  Libby Maw, MD

## 2017-03-23 NOTE — Patient Instructions (Addendum)
Phlebitis Phlebitis is soreness and puffiness (swelling) in a vein. Follow these instructions at home:  Only take medicine as told by your doctor.  Raise (elevate) the affected limb on a pillow as told by your doctor.  Keep a warm pack on the affected vein as told by your doctor. Do not sleep with a heating pad.  Use special stockings or bandages around the area of the affected vein as told by your doctor. These will speed healing and keep the condition from coming back.  Talk to your doctor about all the medicines you take.  Get follow-up blood tests as told by your doctor.  If the phlebitis is in your legs: ? Avoid standing or resting for long periods. ? Keep your legs moving. Raise your legs when you sit or lie.  Do not smoke.  Follow-up with your doctor as told. Contact a doctor if:  You have strange bruises or bleeding.  Your puffiness or pain in the affected area is not getting better.  You are taking medicine to lessen puffiness (anti-inflammatory medicine), and you get belly pain.  You have a fever. Get help right away if:  The phlebitis gets worse and you have more pain, puffiness (swelling), or redness.  You have trouble breathing or have chest pain. This information is not intended to replace advice given to you by your health care provider. Make sure you discuss any questions you have with your health care provider. Document Released: 02/26/2009 Document Revised: 08/16/2015 Document Reviewed: 11/15/2012 Elsevier Interactive Patient Education  2017 Elsevier Inc.  

## 2017-03-29 DIAGNOSIS — G4733 Obstructive sleep apnea (adult) (pediatric): Secondary | ICD-10-CM | POA: Diagnosis not present

## 2017-03-30 DIAGNOSIS — G4733 Obstructive sleep apnea (adult) (pediatric): Secondary | ICD-10-CM | POA: Diagnosis not present

## 2017-04-07 DIAGNOSIS — J9601 Acute respiratory failure with hypoxia: Secondary | ICD-10-CM | POA: Diagnosis not present

## 2017-04-09 ENCOUNTER — Other Ambulatory Visit: Payer: Self-pay | Admitting: *Deleted

## 2017-04-09 DIAGNOSIS — G4733 Obstructive sleep apnea (adult) (pediatric): Secondary | ICD-10-CM

## 2017-04-13 ENCOUNTER — Other Ambulatory Visit (HOSPITAL_BASED_OUTPATIENT_CLINIC_OR_DEPARTMENT_OTHER): Payer: Medicare Other

## 2017-04-22 ENCOUNTER — Other Ambulatory Visit: Payer: Self-pay | Admitting: Internal Medicine

## 2017-05-08 DIAGNOSIS — J9601 Acute respiratory failure with hypoxia: Secondary | ICD-10-CM | POA: Diagnosis not present

## 2017-05-21 ENCOUNTER — Telehealth: Payer: Self-pay | Admitting: Internal Medicine

## 2017-05-21 NOTE — Telephone Encounter (Signed)
[employer] dropped off a disability placard  Mail to: Self Addressed envelope is attached  Disposition: Dr's folder

## 2017-05-22 NOTE — Telephone Encounter (Signed)
Thank you Filling it out now.

## 2017-05-28 NOTE — Telephone Encounter (Signed)
Spoke with Laurie Dominguez and updated him that Dr.Kwiatkowski has to sign off on the form. He said there was no rush the form just has to be filled out before the patients old one runs out which is 06/21/2017. I informed him that Dr.Kwiatkowski will be back in the office Monday and that we will fill it out as soon as possible.

## 2017-06-03 ENCOUNTER — Telehealth: Payer: Self-pay | Admitting: Internal Medicine

## 2017-06-03 NOTE — Telephone Encounter (Signed)
I think we had already ordered CPAP, but she never picked it up because she couldn't tolerate mask. We ordered a CPAP mask desensitization referral to the daytime sleep center tech. She may not have gone for that. We need to check with [facility] and see if they still have a valid CPAP order on file.

## 2017-06-03 NOTE — Telephone Encounter (Signed)
Spoke with pt, she states she never received her CPAP but it looks like we didn't get HST results and we didn't place a new order for a new CPAP. Can we send order? Please advise on settings.   Patient also requests a POC. Can we place an order for this as well?  Patient Instructions by Deneise Lever, MD at 02/02/2017 11:00 AM   Author: Deneise Lever, MD Author Type: Physician Filed: 02/02/2017 11:29 AM  Note Status: Signed Cosign: Cosign Not Required Encounter Date: 02/02/2017  Editor: Deneise Lever, MD (Physician)    Order- referral to daytime sleep center tech for CPAP mask fitting and desensitization  Continue CPAP auto 5-20, mask of choice, humidifier, supplies, AirView, O2 5L bleed- in for sleep    Dx OSA  Please call as needed

## 2017-06-04 NOTE — Telephone Encounter (Signed)
Attempted to contact pt. No answer, no option to leave a message. Will try back.  

## 2017-06-05 ENCOUNTER — Ambulatory Visit: Payer: Medicare Other | Admitting: Internal Medicine

## 2017-06-05 DIAGNOSIS — J9601 Acute respiratory failure with hypoxia: Secondary | ICD-10-CM | POA: Diagnosis not present

## 2017-06-05 NOTE — Telephone Encounter (Signed)
Attempted to contact pt. No answer, no option to leave a message. Will try back.  

## 2017-06-08 ENCOUNTER — Telehealth: Payer: Self-pay | Admitting: Internal Medicine

## 2017-06-08 DIAGNOSIS — G4733 Obstructive sleep apnea (adult) (pediatric): Secondary | ICD-10-CM

## 2017-06-08 NOTE — Telephone Encounter (Signed)
Called and spoke with patient, she states that she had a home sleep test done on 1.6.19. Called and spoke with [name] he states that they still have an order and he will start that as a new order. Nothing further is needed. Patient is aware.

## 2017-06-08 NOTE — Telephone Encounter (Signed)
Laurie Dominguez with [facility] is requesting cpap order to be placed in template.  Order has been placed based off of 06/03/17 phone note.  Nothing further is needed at this time.

## 2017-06-15 DIAGNOSIS — G4733 Obstructive sleep apnea (adult) (pediatric): Secondary | ICD-10-CM | POA: Diagnosis not present

## 2017-06-15 DIAGNOSIS — J9601 Acute respiratory failure with hypoxia: Secondary | ICD-10-CM | POA: Diagnosis not present

## 2017-06-25 ENCOUNTER — Ambulatory Visit: Payer: Medicare Other | Admitting: Internal Medicine

## 2017-06-25 DIAGNOSIS — Z0289 Encounter for other administrative examinations: Secondary | ICD-10-CM

## 2017-07-06 DIAGNOSIS — J9601 Acute respiratory failure with hypoxia: Secondary | ICD-10-CM | POA: Diagnosis not present

## 2017-07-07 ENCOUNTER — Encounter: Payer: Self-pay | Admitting: Internal Medicine

## 2017-07-09 ENCOUNTER — Other Ambulatory Visit: Payer: Self-pay | Admitting: Internal Medicine

## 2017-07-14 ENCOUNTER — Encounter: Payer: Self-pay | Admitting: Internal Medicine

## 2017-07-14 ENCOUNTER — Ambulatory Visit (INDEPENDENT_AMBULATORY_CARE_PROVIDER_SITE_OTHER): Payer: Medicare Other | Admitting: Internal Medicine

## 2017-07-14 VITALS — BP 118/60 | HR 89 | Temp 98.2°F | Wt 263.0 lb

## 2017-07-14 DIAGNOSIS — I10 Essential (primary) hypertension: Secondary | ICD-10-CM | POA: Diagnosis not present

## 2017-07-14 DIAGNOSIS — J449 Chronic obstructive pulmonary disease, unspecified: Secondary | ICD-10-CM

## 2017-07-14 NOTE — Patient Instructions (Signed)
Limit your sodium (Salt) intake  Please check your blood pressure on a regular basis.  If it is consistently greater than 150/90, please make an office appointment.  Return in 6 months for follow-up   

## 2017-07-14 NOTE — Progress Notes (Signed)
Subjective:    Patient ID: Laurie Dominguez Comment, female    DOB: 1950/06/03, 67 y.o.   MRN: [id number]  HPI 67 year old patient who is seen today for follow-up.  She has a history of essential hypertension obesity and OSA.  She has morbid obesity with chronic respiratory failure.  She has a history of impaired glucose tolerance. Complaints today include a sense of tightness in a bandlike sensation across the anterior right ankle area. Her pulmonary status seems fairly stable  Past Medical History:  Diagnosis Date  . Colon polyp 08/16/2004   Hyperplastic  . Depression   . GERD (gastroesophageal reflux disease)   . Hypertension   . Nephrolithiasis   . Obesity      Social History   Socioeconomic History  . Marital status: Married    Spouse name: Not on file  . Number of children: Not on file  . Years of education: Not on file  . Highest education level: Not on file  Occupational History  . Not on file  Social Needs  . Financial resource strain: Not on file  . Food insecurity:    Worry: Not on file    Inability: Not on file  . Transportation needs:    Medical: Not on file    Non-medical: Not on file  Tobacco Use  . Smoking status: Former Smoker    Packs/day: 2.00    Years: 40.00    Pack years: 80.00    Types: Cigarettes    Last attempt to quit: 03/24/2006    Years since quitting: 11.3  . Smokeless tobacco: Never Used  Substance and Sexual Activity  . Alcohol use: No    Alcohol/week: 0.0 oz  . Drug use: No  . Sexual activity: Not on file  Lifestyle  . Physical activity:    Days per week: Not on file    Minutes per session: Not on file  . Stress: Not on file  Relationships  . Social connections:    Talks on phone: Not on file    Gets together: Not on file    Attends religious service: Not on file    Active member of club or organization: Not on file    Attends meetings of clubs or organizations: Not on file    Relationship status: Not on file  . Intimate partner  violence:    Fear of current or ex partner: Not on file    Emotionally abused: Not on file    Physically abused: Not on file    Forced sexual activity: Not on file  Other Topics Concern  . Not on file  Social History Narrative  . Not on file    Past Surgical History:  Procedure Laterality Date  . CHOLECYSTECTOMY    . DILATION AND CURETTAGE OF UTERUS    . TONSILLECTOMY      Family History  Problem Relation Age of Onset  . Cancer Mother        unknown primary  . Liver cancer Mother        unknown primary  . Other Father        spinal cord tumor    Allergies  Allergen Reactions  . Codeine Phosphate     Current Outpatient Medications on File Prior to Visit  Medication Sig Dispense Refill  . albuterol (PROVENTIL HFA;VENTOLIN HFA) 108 (90 Base) MCG/ACT inhaler Inhale 2 puffs into the lungs every 4 (four) hours as needed for wheezing or shortness of breath. 1 Inhaler 1  .  amLODipine (NORVASC) 5 MG tablet TAKE 1 TABLET BY MOUTH  DAILY 90 tablet 3  . budesonide-formoterol (SYMBICORT) 160-4.5 MCG/ACT inhaler Inhale 2 puffs into the lungs 2 (two) times daily. 1 Inhaler 1  . diazepam (VALIUM) 2 MG tablet Take 1 tablet (2 mg total) by mouth at bedtime as needed. (Patient taking differently: Take 2 mg by mouth at bedtime as needed for anxiety or muscle spasms. ) 90 tablet 1  . furosemide (LASIX) 20 MG tablet TAKE 1 TABLET BY MOUTH  DAILY 90 tablet 3  . losartan-hydrochlorothiazide (HYZAAR) 100-25 MG tablet Take 1 tablet by mouth daily. 90 tablet 3  . meclizine (ANTIVERT) 25 MG tablet Take 1 tablet (25 mg total) by mouth daily as needed. (Patient taking differently: Take 25 mg by mouth 2 (two) times daily as needed for dizziness or nausea. ) 90 tablet 1  . naproxen (NAPROSYN) 500 MG tablet TAKE 1 TABLET BY MOUTH TWO  TIMES DAILY WITH MEALS 180 tablet 3  . omeprazole (PRILOSEC) 20 MG capsule TAKE 1 CAPSULE BY MOUTH  EVERY DAY 90 capsule 3  . PARoxetine (PAXIL) 30 MG tablet TAKE 2 TABLETS  BY MOUTH  EVERY MORNING 180 tablet 1  . traMADol (ULTRAM) 50 MG tablet Take 1 tablet (50 mg total) by mouth every 6 (six) hours as needed. (Patient taking differently: Take 50 mg by mouth every 6 (six) hours as needed for moderate pain or severe pain. ) 180 tablet 1  . fluticasone (FLONASE) 50 MCG/ACT nasal spray Place 2 sprays into both nostrils daily. 1 g 0   No current facility-administered medications on file prior to visit.     BP 118/60 (BP Location: Left Arm, Patient Position: Sitting)   Pulse 89   Temp 98.2 F (36.8 C) (Oral)   Wt 263 lb (119.3 kg)   SpO2 91%   BMI 48.10 kg/m      Review of Systems  Constitutional: Negative.   HENT: Negative for congestion, dental problem, hearing loss, rhinorrhea, sinus pressure, sore throat and tinnitus.   Eyes: Negative for pain, discharge and visual disturbance.  Respiratory: Negative for cough and shortness of breath.   Cardiovascular: Positive for leg swelling. Negative for chest pain and palpitations.  Gastrointestinal: Negative for abdominal distention, abdominal pain, blood in stool, constipation, diarrhea, nausea and vomiting.  Genitourinary: Negative for difficulty urinating, dysuria, flank pain, frequency, hematuria, pelvic pain, urgency, vaginal bleeding, vaginal discharge and vaginal pain.  Musculoskeletal: Positive for arthralgias, back pain and gait problem. Negative for joint swelling.  Skin: Negative for rash.  Neurological: Negative for dizziness, syncope, speech difficulty, weakness, numbness and headaches.  Hematological: Negative for adenopathy.  Psychiatric/Behavioral: Negative for agitation, behavioral problems and dysphoric mood. The patient is not nervous/anxious.        Objective:   Physical Exam  Constitutional: She is oriented to person, place, and time. She appears well-developed and well-nourished.   Weight 263 Wheelchair-bound Blood pressure well controlled   HENT:  Head: Normocephalic.  Right Ear:  External ear normal.  Left Ear: External ear normal.  Mouth/Throat: Oropharynx is clear and moist.  Eyes: Pupils are equal, round, and reactive to light. Conjunctivae and EOM are normal.  Neck: Normal range of motion. Neck supple. No thyromegaly present.  Cardiovascular: Normal rate, regular rhythm, normal heart sounds and intact distal pulses.  Pulmonary/Chest: Effort normal and breath sounds normal.  Abdominal: Soft. Bowel sounds are normal. She exhibits no mass. There is no tenderness.  Musculoskeletal: Normal range of motion.  Trace edema about the right ankle.  No rash  Lymphadenopathy:    She has no cervical adenopathy.  Neurological: She is alert and oriented to person, place, and time.  Skin: Skin is warm and dry. No rash noted.  Psychiatric: She has a normal mood and affect. Her behavior is normal.          Assessment & Plan:   Mild pedal edema Essential hypertension stable  Continue amlodipine and furosemide  Moderate salt intake  Follow-up 6 months or as needed  Nyoka Cowden .

## 2017-07-16 DIAGNOSIS — G4733 Obstructive sleep apnea (adult) (pediatric): Secondary | ICD-10-CM | POA: Diagnosis not present

## 2017-08-05 DIAGNOSIS — J9601 Acute respiratory failure with hypoxia: Secondary | ICD-10-CM | POA: Diagnosis not present

## 2017-08-06 ENCOUNTER — Encounter: Payer: Self-pay | Admitting: Internal Medicine

## 2017-08-06 DIAGNOSIS — L814 Other melanin hyperpigmentation: Secondary | ICD-10-CM | POA: Diagnosis not present

## 2017-08-06 DIAGNOSIS — Z85828 Personal history of other malignant neoplasm of skin: Secondary | ICD-10-CM | POA: Diagnosis not present

## 2017-08-06 DIAGNOSIS — D485 Neoplasm of uncertain behavior of skin: Secondary | ICD-10-CM | POA: Diagnosis not present

## 2017-08-06 DIAGNOSIS — L820 Inflamed seborrheic keratosis: Secondary | ICD-10-CM | POA: Diagnosis not present

## 2017-08-06 DIAGNOSIS — L281 Prurigo nodularis: Secondary | ICD-10-CM | POA: Diagnosis not present

## 2017-08-06 DIAGNOSIS — L218 Other seborrheic dermatitis: Secondary | ICD-10-CM | POA: Diagnosis not present

## 2017-08-06 DIAGNOSIS — L821 Other seborrheic keratosis: Secondary | ICD-10-CM | POA: Diagnosis not present

## 2017-08-15 DIAGNOSIS — G4733 Obstructive sleep apnea (adult) (pediatric): Secondary | ICD-10-CM | POA: Diagnosis not present

## 2017-09-05 DIAGNOSIS — J9601 Acute respiratory failure with hypoxia: Secondary | ICD-10-CM | POA: Diagnosis not present

## 2017-09-28 ENCOUNTER — Ambulatory Visit (INDEPENDENT_AMBULATORY_CARE_PROVIDER_SITE_OTHER): Payer: Medicare Other | Admitting: Internal Medicine

## 2017-09-28 ENCOUNTER — Encounter: Payer: Self-pay | Admitting: Internal Medicine

## 2017-09-28 VITALS — BP 142/70 | HR 74 | Temp 98.9°F | Ht 62.0 in | Wt 263.0 lb

## 2017-09-28 DIAGNOSIS — K2100 Gastro-esophageal reflux disease with esophagitis, without bleeding: Secondary | ICD-10-CM

## 2017-09-28 DIAGNOSIS — R7302 Impaired glucose tolerance (oral): Secondary | ICD-10-CM | POA: Diagnosis not present

## 2017-09-28 DIAGNOSIS — J9601 Acute respiratory failure with hypoxia: Secondary | ICD-10-CM | POA: Diagnosis not present

## 2017-09-28 DIAGNOSIS — I10 Essential (primary) hypertension: Secondary | ICD-10-CM | POA: Diagnosis not present

## 2017-09-28 DIAGNOSIS — Z23 Encounter for immunization: Secondary | ICD-10-CM | POA: Diagnosis not present

## 2017-09-28 DIAGNOSIS — Z0000 Encounter for general adult medical examination without abnormal findings: Secondary | ICD-10-CM

## 2017-09-28 LAB — CBC WITH DIFFERENTIAL/PLATELET
BASOS PCT: 1 % (ref 0.0–3.0)
Basophils Absolute: 0.1 10*3/uL (ref 0.0–0.1)
EOS ABS: 0.1 10*3/uL (ref 0.0–0.7)
Eosinophils Relative: 1.9 % (ref 0.0–5.0)
HCT: 43.4 % (ref 36.0–46.0)
HEMOGLOBIN: 14.4 g/dL (ref 12.0–15.0)
Lymphocytes Relative: 22.4 % (ref 12.0–46.0)
Lymphs Abs: 1.3 10*3/uL (ref 0.7–4.0)
MCHC: 33.3 g/dL (ref 30.0–36.0)
MCV: 91.2 fl (ref 78.0–100.0)
MONO ABS: 0.4 10*3/uL (ref 0.1–1.0)
Monocytes Relative: 7.2 % (ref 3.0–12.0)
NEUTROS ABS: 4.1 10*3/uL (ref 1.4–7.7)
Neutrophils Relative %: 67.5 % (ref 43.0–77.0)
PLATELETS: 298 10*3/uL (ref 150.0–400.0)
RBC: 4.76 Mil/uL (ref 3.87–5.11)
RDW: 13.6 % (ref 11.5–15.5)
WBC: 6 10*3/uL (ref 4.0–10.5)

## 2017-09-28 LAB — LIPID PANEL
CHOLESTEROL: 188 mg/dL (ref 0–200)
HDL: 43.3 mg/dL (ref >39.00)
LDL CALC: 125 mg/dL — AB (ref 0–99)
NonHDL: 145.15
Total CHOL/HDL Ratio: 4
Triglycerides: 103 mg/dL (ref 0.0–149.0)
VLDL: 20.6 mg/dL (ref 0.0–40.0)

## 2017-09-28 LAB — COMPREHENSIVE METABOLIC PANEL
ALBUMIN: 3.9 g/dL (ref 3.5–5.2)
ALT: 10 U/L (ref 0–35)
AST: 10 U/L (ref 0–37)
Alkaline Phosphatase: 100 U/L (ref 39–117)
BILIRUBIN TOTAL: 0.4 mg/dL (ref 0.2–1.2)
BUN: 14 mg/dL (ref 6–23)
CHLORIDE: 100 meq/L (ref 96–112)
CO2: 33 mEq/L — ABNORMAL HIGH (ref 19–32)
CREATININE: 0.65 mg/dL (ref 0.40–1.20)
Calcium: 9.3 mg/dL (ref 8.4–10.5)
GFR: 96.25 mL/min (ref >60.00)
Glucose, Bld: 109 mg/dL — ABNORMAL HIGH (ref 70–99)
Potassium: 3.7 mEq/L (ref 3.5–5.1)
Sodium: 142 mEq/L (ref 135–145)
Total Protein: 6.8 g/dL (ref 6.0–8.3)

## 2017-09-28 LAB — TSH: TSH: 3.12 u[IU]/mL (ref 0.35–4.50)

## 2017-09-28 NOTE — Progress Notes (Signed)
Subjective:    Patient ID: Laurie Dominguez Comment, female    DOB: April 08, 1950, 67 y.o.   MRN: [id number]  HPI  67 year old patient who is seen today for a annual preventive health examination as well as a subsequent Medicare wellness visit She has a history of obesity/OSA as well as chronic hypoxic respiratory insufficiency. She is followed by pulmonary medicine and is having some issues with proper fit of CPAP mask. Her pulmonary status has been stable.  She does use nocturnal home O2.  She has a history of COPD and did have a chest CTA in January 2018 She has essential hypertension gastroesophageal reflux disease.  Last colonoscopy was 2006.  Cologuard 2017  Past Medical History:  Diagnosis Date  . Colon polyp 08/16/2004   Hyperplastic  . Depression   . GERD (gastroesophageal reflux disease)   . Hypertension   . Nephrolithiasis   . Obesity      Social History   Socioeconomic History  . Marital status: Married    Spouse name: Not on file  . Number of children: Not on file  . Years of education: Not on file  . Highest education level: Not on file  Occupational History  . Not on file  Social Needs  . Financial resource strain: Not on file  . Food insecurity:    Worry: Not on file    Inability: Not on file  . Transportation needs:    Medical: Not on file    Non-medical: Not on file  Tobacco Use  . Smoking status: Former Smoker    Packs/day: 2.00    Years: 40.00    Pack years: 80.00    Types: Cigarettes    Last attempt to quit: 03/24/2006    Years since quitting: 11.5  . Smokeless tobacco: Never Used  Substance and Sexual Activity  . Alcohol use: No    Alcohol/week: 0.0 oz  . Drug use: No  . Sexual activity: Not on file  Lifestyle  . Physical activity:    Days per week: Not on file    Minutes per session: Not on file  . Stress: Not on file  Relationships  . Social connections:    Talks on phone: Not on file    Gets together: Not on file    Attends religious  service: Not on file    Active member of club or organization: Not on file    Attends meetings of clubs or organizations: Not on file    Relationship status: Not on file  . Intimate partner violence:    Fear of current or ex partner: Not on file    Emotionally abused: Not on file    Physically abused: Not on file    Forced sexual activity: Not on file  Other Topics Concern  . Not on file  Social History Narrative  . Not on file    Past Surgical History:  Procedure Laterality Date  . CHOLECYSTECTOMY    . DILATION AND CURETTAGE OF UTERUS    . TONSILLECTOMY      Family History  Problem Relation Age of Onset  . Cancer Mother        unknown primary  . Liver cancer Mother        unknown primary  . Other Father        spinal cord tumor    Allergies  Allergen Reactions  . Codeine Phosphate     Current Outpatient Medications on File Prior to Visit  Medication Sig Dispense  Refill  . albuterol (PROVENTIL HFA;VENTOLIN HFA) 108 (90 Base) MCG/ACT inhaler Inhale 2 puffs into the lungs every 4 (four) hours as needed for wheezing or shortness of breath. 1 Inhaler 1  . amLODipine (NORVASC) 5 MG tablet TAKE 1 TABLET BY MOUTH  DAILY 90 tablet 3  . budesonide-formoterol (SYMBICORT) 160-4.5 MCG/ACT inhaler Inhale 2 puffs into the lungs 2 (two) times daily. 1 Inhaler 1  . diazepam (VALIUM) 2 MG tablet Take 1 tablet (2 mg total) by mouth at bedtime as needed. (Patient taking differently: Take 2 mg by mouth at bedtime as needed for anxiety or muscle spasms. ) 90 tablet 1  . furosemide (LASIX) 20 MG tablet TAKE 1 TABLET BY MOUTH  DAILY 90 tablet 3  . losartan-hydrochlorothiazide (HYZAAR) 100-25 MG tablet Take 1 tablet by mouth daily. 90 tablet 3  . meclizine (ANTIVERT) 25 MG tablet Take 1 tablet (25 mg total) by mouth daily as needed. (Patient taking differently: Take 25 mg by mouth 2 (two) times daily as needed for dizziness or nausea. ) 90 tablet 1  . naproxen (NAPROSYN) 500 MG tablet TAKE 1  TABLET BY MOUTH TWO  TIMES DAILY WITH MEALS 180 tablet 3  . omeprazole (PRILOSEC) 20 MG capsule TAKE 1 CAPSULE BY MOUTH  EVERY DAY 90 capsule 3  . PARoxetine (PAXIL) 30 MG tablet TAKE 2 TABLETS BY MOUTH  EVERY MORNING 180 tablet 1  . traMADol (ULTRAM) 50 MG tablet Take 1 tablet (50 mg total) by mouth every 6 (six) hours as needed. (Patient taking differently: Take 50 mg by mouth every 6 (six) hours as needed for moderate pain or severe pain. ) 180 tablet 1  . fluticasone (FLONASE) 50 MCG/ACT nasal spray Place 2 sprays into both nostrils daily. 1 g 0  . triamcinolone cream (KENALOG) 0.1 % APPLY  CREAM EXTERNALLY TO AFFECTED AREA TWICE DAILY FOR 2 WEEKS AS NEEDED FOR  FLARES  0   No current facility-administered medications on file prior to visit.     BP (!) 142/70 (BP Location: Right Wrist, Patient Position: Sitting, Cuff Size: Large)   Pulse 74   Temp 98.9 F (37.2 C) (Oral)   Ht 5\' 2"  (1.575 m)   Wt 263 lb (119.3 kg)   SpO2 91%   BMI 48.10 kg/m   Subsequent Medicare wellness visit  .  Cardiovascular risk factors include a history of hypertension1. Risk factors, based on past  M,S,F history  2.  Physical activities: Limited due to obesity and chronic hypoxic respiratory insufficiency.  Uses a powered wheelchair  3.  Depression/mood: History of depression which has been stable on diazepam  4.  Hearing: No major deficits does complain of some mild tinnitus 5.  ADL's: Independent  6.  Fall risk: High due to weight and general debility  7.  Home safety: No issues identified  8.  Height weight, and visual acuity; height and weight stable no change in visual acuity  9.  Counseling: Weight loss encouraged.  Follow-up pulmonary medicine recommended  10. Lab orders based on risk factors: We will check laboratory update  11. Referral : Follow-up pulmonary medicine  12. Care plan: Continue efforts at aggressive risk factor modification.  Repeat Cologuard screening 1 year  13.  Cognitive assessment: Alert and appropriate normal affect.  No cognitive dysfunction 14. Screening: Patient provided with a written and personalized 5-10 year screening schedule in the AVS.    15. Provider List Update: Primary care pulmonary medicine    Review of Systems  Constitutional: Negative.   HENT: Negative for congestion, dental problem, hearing loss, rhinorrhea, sinus pressure, sore throat and tinnitus.   Eyes: Negative for pain, discharge and visual disturbance.  Respiratory: Negative for cough and shortness of breath.   Cardiovascular: Negative for chest pain, palpitations and leg swelling.  Gastrointestinal: Negative for abdominal distention, abdominal pain, blood in stool, constipation, diarrhea, nausea and vomiting.  Genitourinary: Negative for difficulty urinating, dysuria, flank pain, frequency, hematuria, pelvic pain, urgency, vaginal bleeding, vaginal discharge and vaginal pain.  Musculoskeletal: Positive for arthralgias, back pain and gait problem. Negative for joint swelling.  Skin: Negative for rash.  Neurological: Negative for dizziness, syncope, speech difficulty, weakness, numbness and headaches.  Hematological: Negative for adenopathy.  Psychiatric/Behavioral: Negative for agitation, behavioral problems and dysphoric mood. The patient is not nervous/anxious.        Objective:   Physical Exam  Constitutional: She is oriented to person, place, and time. She appears well-developed and well-nourished. No distress.  Weight 263 Blood pressure 130/80  HENT:  Head: Normocephalic.  Right Ear: External ear normal.  Left Ear: External ear normal.  Mouth/Throat: Oropharynx is clear and moist.  Eyes: Pupils are equal, round, and reactive to light. Conjunctivae and EOM are normal.  Neck: Normal range of motion. Neck supple. No thyromegaly present.  Cardiovascular: Normal rate, regular rhythm, normal heart sounds and intact distal pulses.  Pulmonary/Chest: Effort normal  and breath sounds normal.  O2 saturation 92%  Abdominal: Soft. Bowel sounds are normal. She exhibits no mass. There is no tenderness.  Musculoskeletal: Normal range of motion.  Lymphadenopathy:    She has no cervical adenopathy.  Neurological: She is alert and oriented to person, place, and time.  Skin: Skin is warm and dry. No rash noted.  Psychiatric: She has a normal mood and affect. Her behavior is normal.          Assessment & Plan:   Preventive health examination Subsequent Medicare wellness visit COPD Obesity/OSA/chronic hypoxic respiratory insufficiency.  Follow-up pulmonary medicine/history depression stable/ essential hypertension.  Stable no change in medical regimen  We will review updated lab Follow-up pulmonary medicine Repeat Cologuard 1 year  Pneumovax administered  follow-up 6 months with new provider  Marletta Lor

## 2017-09-28 NOTE — Patient Instructions (Signed)
Limit your sodium (Salt) intake  You need to lose weight.  Consider a lower calorie diet and regular exercise.  Follow-up Dr. Annamaria Boots for your pulmonary issues  Return in 6 months for follow-up

## 2017-09-29 LAB — HEPATITIS C ANTIBODY
Hepatitis C Ab: NONREACTIVE
SIGNAL TO CUT-OFF: 0.02 (ref <1.00)

## 2017-10-05 DIAGNOSIS — J9601 Acute respiratory failure with hypoxia: Secondary | ICD-10-CM | POA: Diagnosis not present

## 2017-10-28 ENCOUNTER — Encounter: Payer: Self-pay | Admitting: Adult Health

## 2017-10-28 ENCOUNTER — Ambulatory Visit (INDEPENDENT_AMBULATORY_CARE_PROVIDER_SITE_OTHER): Payer: Medicare Other | Admitting: Adult Health

## 2017-10-28 VITALS — BP 126/70 | Temp 98.2°F | Wt 267.0 lb

## 2017-10-28 DIAGNOSIS — Z7689 Persons encountering health services in other specified circumstances: Secondary | ICD-10-CM | POA: Diagnosis not present

## 2017-10-28 DIAGNOSIS — Z780 Asymptomatic menopausal state: Secondary | ICD-10-CM

## 2017-10-28 DIAGNOSIS — F339 Major depressive disorder, recurrent, unspecified: Secondary | ICD-10-CM

## 2017-10-28 DIAGNOSIS — Z1231 Encounter for screening mammogram for malignant neoplasm of breast: Secondary | ICD-10-CM | POA: Diagnosis not present

## 2017-10-28 DIAGNOSIS — I10 Essential (primary) hypertension: Secondary | ICD-10-CM

## 2017-10-28 DIAGNOSIS — G4733 Obstructive sleep apnea (adult) (pediatric): Secondary | ICD-10-CM

## 2017-10-28 DIAGNOSIS — K2100 Gastro-esophageal reflux disease with esophagitis, without bleeding: Secondary | ICD-10-CM

## 2017-10-28 DIAGNOSIS — Z760 Encounter for issue of repeat prescription: Secondary | ICD-10-CM

## 2017-10-28 MED ORDER — HYDROCORTISONE 2.5 % EX OINT: TOPICAL_OINTMENT | Freq: Two times a day (BID) | CUTANEOUS | 3 refills | Status: DC

## 2017-10-28 NOTE — Patient Instructions (Signed)
It was great seeing you today   Please follow up in July for your physical   Call the breast center for your mammogram and bone density screen   Address: [place], [place]  Phone: [phone]  Please let me know if you need anything

## 2017-10-28 NOTE — Progress Notes (Signed)
Patient presents to clinic today to establish care. She is a pleasant 67 year old female who  has a past medical history of Colon polyp (08/16/2004), Depression, GERD (gastroesophageal reflux disease), Hypertension, Nephrolithiasis, and Obesity.  She is a former patient of Dr. Raliegh Ip, who is transferring care to me as her PCP is retiring for practice.   Acute Concerns: Establish Care   Chronic Issues: Sleep Apnea - Has CPAP but is having trouble finding a mask that fits properly. She is followed by Pulmonary   COPD - uses nocturnal 02 and Symbicort   Essential hypertension - Controlled with Norvac 5 mg, Hyzaar 100-25 mg,and Lasix   Depression - Takes Paxil 60 mg daily - feels controlled.   GERD - Prilosec 20 mg controlled.   Anxiety - related to travel, takes Valium as needed   Chronic pain in bilateral knees - takes Tramadol 50 every 6 hours PRN   Health Maintenance: Dental -- Routine  Vision -- Routine  Immunizations --  Colonoscopy -- Cologuard in 2017 - 3 years  Mammogram -- Is due  PAP -- No longer needs Bone Density -- Never had  Treatment Team  - Pulmonary  - Dermatology - Dr. Ubaldo Glassing    Past Medical History:  Diagnosis Date  . Colon polyp 08/16/2004   Hyperplastic  . Depression   . GERD (gastroesophageal reflux disease)   . Hypertension   . Nephrolithiasis   . Obesity     Past Surgical History:  Procedure Laterality Date  . CHOLECYSTECTOMY    . DILATION AND CURETTAGE OF UTERUS    . TONSILLECTOMY      Current Outpatient Medications on File Prior to Visit  Medication Sig Dispense Refill  . albuterol (PROVENTIL HFA;VENTOLIN HFA) 108 (90 Base) MCG/ACT inhaler Inhale 2 puffs into the lungs every 4 (four) hours as needed for wheezing or shortness of breath. 1 Inhaler 1  . amLODipine (NORVASC) 5 MG tablet TAKE 1 TABLET BY MOUTH  DAILY 90 tablet 3  . budesonide-formoterol (SYMBICORT) 160-4.5 MCG/ACT inhaler Inhale 2 puffs into the lungs 2 (two) times daily. 1  Inhaler 1  . diazepam (VALIUM) 2 MG tablet Take 1 tablet (2 mg total) by mouth at bedtime as needed. (Patient taking differently: Take 2 mg by mouth at bedtime as needed for anxiety or muscle spasms. ) 90 tablet 1  . furosemide (LASIX) 20 MG tablet TAKE 1 TABLET BY MOUTH  DAILY 90 tablet 3  . hydrocortisone 2.5 % ointment Use 2-4 applications daily    . losartan-hydrochlorothiazide (HYZAAR) 100-25 MG tablet Take 1 tablet by mouth daily. 90 tablet 3  . meclizine (ANTIVERT) 25 MG tablet Take 1 tablet (25 mg total) by mouth daily as needed. (Patient taking differently: Take 25 mg by mouth 2 (two) times daily as needed for dizziness or nausea. ) 90 tablet 1  . naproxen (NAPROSYN) 500 MG tablet TAKE 1 TABLET BY MOUTH TWO  TIMES DAILY WITH MEALS 180 tablet 3  . omeprazole (PRILOSEC) 20 MG capsule TAKE 1 CAPSULE BY MOUTH  EVERY DAY 90 capsule 3  . PARoxetine (PAXIL) 30 MG tablet TAKE 2 TABLETS BY MOUTH  EVERY MORNING 180 tablet 1  . traMADol (ULTRAM) 50 MG tablet Take 1 tablet (50 mg total) by mouth every 6 (six) hours as needed. (Patient taking differently: Take 50 mg by mouth every 6 (six) hours as needed for moderate pain or severe pain. ) 180 tablet 1  . triamcinolone cream (KENALOG) 0.1 % APPLY  CREAM EXTERNALLY TO AFFECTED AREA TWICE DAILY FOR 2 WEEKS AS NEEDED FOR  FLARES  0  . fluticasone (FLONASE) 50 MCG/ACT nasal spray Place 2 sprays into both nostrils daily. 1 g 0   No current facility-administered medications on file prior to visit.     Allergies  Allergen Reactions  . Codeine Phosphate     Family History  Problem Relation Age of Onset  . Cancer Mother        unknown primary  . Liver cancer Mother        unknown primary  . Other Father        spinal cord tumor    Social History   Socioeconomic History  . Marital status: Married    Spouse name: Not on file  . Number of children: Not on file  . Years of education: Not on file  . Highest education level: Not on file    Occupational History  . Not on file  Social Needs  . Financial resource strain: Not on file  . Food insecurity:    Worry: Not on file    Inability: Not on file  . Transportation needs:    Medical: Not on file    Non-medical: Not on file  Tobacco Use  . Smoking status: Former Smoker    Packs/day: 2.00    Years: 40.00    Pack years: 80.00    Types: Cigarettes    Last attempt to quit: 03/24/2006    Years since quitting: 11.6  . Smokeless tobacco: Never Used  Substance and Sexual Activity  . Alcohol use: No    Alcohol/week: 0.0 oz  . Drug use: No  . Sexual activity: Not on file  Lifestyle  . Physical activity:    Days per week: Not on file    Minutes per session: Not on file  . Stress: Not on file  Relationships  . Social connections:    Talks on phone: Not on file    Gets together: Not on file    Attends religious service: Not on file    Active member of club or organization: Not on file    Attends meetings of clubs or organizations: Not on file    Relationship status: Not on file  . Intimate partner violence:    Fear of current or ex partner: Not on file    Emotionally abused: Not on file    Physically abused: Not on file    Forced sexual activity: Not on file  Other Topics Concern  . Not on file  Social History Narrative  . Not on file    Review of Systems  Constitutional: Negative.   Respiratory: Positive for shortness of breath.   Cardiovascular: Positive for leg swelling.  Musculoskeletal: Positive for joint pain.  Neurological: Negative.   Psychiatric/Behavioral: Positive for depression.  All other systems reviewed and are negative.     BP 126/70 (BP Location: Left Wrist)   Temp 98.2 F (36.8 C) (Oral)   Wt 267 lb (121.1 kg)   BMI 48.83 kg/m   Physical Exam  Constitutional: She is oriented to person, place, and time. She appears well-developed and well-nourished. No distress.  Eyes: Pupils are equal, round, and reactive to light. EOM are normal.   Neck: Normal range of motion. Neck supple.  Cardiovascular: Normal rate, regular rhythm, normal heart sounds and intact distal pulses.  Pulmonary/Chest: Effort normal and breath sounds normal.  Musculoskeletal: Normal range of motion.  In motorized whee chair  Neurological: She is alert and oriented to person, place, and time.  Skin: Skin is warm and dry. She is not diaphoretic.  Psychiatric: She has a normal mood and affect. Her behavior is normal. Judgment and thought content normal.  Nursing note and vitals reviewed.   Recent Results (from the past 2160 hour(s))  Hepatitis C antibody     Status: None   Collection Time: 09/28/17 10:09 AM  Result Value Ref Range   Hepatitis C Ab NON-REACTIVE NON-REACTI   SIGNAL TO CUT-OFF 0.02 <1.00    Comment: . HCV antibody was non-reactive. There is no laboratory  evidence of HCV infection. . In most cases, no further action is required. However, if recent HCV exposure is suspected, a test for HCV RNA (test code [phone]) is suggested. . For additional information please refer to http://education.[facility].com/faq/FAQ22v1 (This link is being provided for informational/ educational purposes only.) .   TSH     Status: None   Collection Time: 09/28/17 10:09 AM  Result Value Ref Range   TSH 3.12 0.35 - 4.50 uIU/mL  Lipid panel     Status: Abnormal   Collection Time: 09/28/17 10:09 AM  Result Value Ref Range   Cholesterol 188 0 - 200 mg/dL    Comment: ATP III Classification       Desirable:  < 200 mg/dL               Borderline High:  200 - 239 mg/dL          High:  > = 240 mg/dL   Triglycerides 103.0 0.0 - 149.0 mg/dL    Comment: Normal:  <150 mg/dLBorderline High:  150 - 199 mg/dL   HDL 43.30 >39.00 mg/dL   VLDL 20.6 0.0 - 40.0 mg/dL   LDL Cholesterol 125 (H) 0 - 99 mg/dL   Total CHOL/HDL Ratio 4     Comment:                Men          Women1/2 Average Risk     3.4          3.3Average Risk          5.0          4.42X Average Risk           9.6          7.13X Average Risk          15.0          11.0                       NonHDL 145.15     Comment: NOTE:  Non-HDL goal should be 30 mg/dL higher than patient's LDL goal (i.e. LDL goal of < 70 mg/dL, would have non-HDL goal of < 100 mg/dL)  Comprehensive metabolic panel     Status: Abnormal   Collection Time: 09/28/17 10:09 AM  Result Value Ref Range   Sodium 142 135 - 145 mEq/L   Potassium 3.7 3.5 - 5.1 mEq/L   Chloride 100 96 - 112 mEq/L   CO2 33 (H) 19 - 32 mEq/L   Glucose, Bld 109 (H) 70 - 99 mg/dL   BUN 14 6 - 23 mg/dL   Creatinine, Ser 0.65 0.40 - 1.20 mg/dL   Total Bilirubin 0.4 0.2 - 1.2 mg/dL   Alkaline Phosphatase 100 39 - 117 U/L   AST 10 0 - 37 U/L   ALT  10 0 - 35 U/L   Total Protein 6.8 6.0 - 8.3 g/dL   Albumin 3.9 3.5 - 5.2 g/dL   Calcium 9.3 8.4 - 10.5 mg/dL   GFR 96.25 >60.00 mL/min  CBC with Differential/Platelet     Status: None   Collection Time: 09/28/17 10:09 AM  Result Value Ref Range   WBC 6.0 4.0 - 10.5 K/uL   RBC 4.76 3.87 - 5.11 Mil/uL   Hemoglobin 14.4 12.0 - 15.0 g/dL   HCT 43.4 36.0 - 46.0 %   MCV 91.2 78.0 - 100.0 fl   MCHC 33.3 30.0 - 36.0 g/dL   RDW 13.6 11.5 - 15.5 %   Platelets 298.0 150.0 - 400.0 K/uL   Neutrophils Relative % 67.5 43.0 - 77.0 %   Lymphocytes Relative 22.4 12.0 - 46.0 %   Monocytes Relative 7.2 3.0 - 12.0 %   Eosinophils Relative 1.9 0.0 - 5.0 %   Basophils Relative 1.0 0.0 - 3.0 %   Neutro Abs 4.1 1.4 - 7.7 K/uL   Lymphs Abs 1.3 0.7 - 4.0 K/uL   Monocytes Absolute 0.4 0.1 - 1.0 K/uL   Eosinophils Absolute 0.1 0.0 - 0.7 K/uL   Basophils Absolute 0.1 0.0 - 0.1 K/uL    Assessment/Plan: 1. Screening mammogram, encounter for  - MM DIGITAL SCREENING BILATERAL; Future  2. Post-menopausal  - DG Bone Density; Future  3. Medication refill  - hydrocortisone 2.5 % ointment; Apply topically 2 (two) times daily. Use 2-4 applications daily  Dispense: 30 g; Refill: 3  4. Essential hypertension -  Controlled. No change in medications   5. Obstructive sleep apnea - Follow up with Pulmonary as directed  6. Gastroesophageal reflux disease with esophagitis - Controlled. No change in medications   7. Depression, recurrent ([place]) - Controlled. No change in medications   [name], NP

## 2017-11-05 DIAGNOSIS — J9601 Acute respiratory failure with hypoxia: Secondary | ICD-10-CM | POA: Diagnosis not present

## 2017-11-12 DIAGNOSIS — H524 Presbyopia: Secondary | ICD-10-CM | POA: Diagnosis not present

## 2017-11-12 DIAGNOSIS — H25813 Combined forms of age-related cataract, bilateral: Secondary | ICD-10-CM | POA: Diagnosis not present

## 2017-11-13 ENCOUNTER — Other Ambulatory Visit: Payer: Self-pay | Admitting: Adult Health

## 2017-11-13 DIAGNOSIS — E2839 Other primary ovarian failure: Secondary | ICD-10-CM

## 2017-11-25 ENCOUNTER — Other Ambulatory Visit: Payer: Self-pay | Admitting: Internal Medicine

## 2017-11-25 NOTE — Telephone Encounter (Signed)
Laurie Dominguez PT

## 2017-11-25 NOTE — Telephone Encounter (Signed)
Sent to the pharmacy by e-scribe. 

## 2017-11-25 NOTE — Telephone Encounter (Signed)
Ok to refill for one year  

## 2017-11-25 NOTE — Telephone Encounter (Signed)
Laurie Dominguez, pt has recently established with you.  Are you filling medication?

## 2017-12-06 DIAGNOSIS — J9601 Acute respiratory failure with hypoxia: Secondary | ICD-10-CM | POA: Diagnosis not present

## 2017-12-22 HISTORY — PX: BREAST BIOPSY: SHX20

## 2018-01-05 DIAGNOSIS — J9601 Acute respiratory failure with hypoxia: Secondary | ICD-10-CM | POA: Diagnosis not present

## 2018-01-06 ENCOUNTER — Ambulatory Visit
Admission: RE | Admit: 2018-01-06 | Discharge: 2018-01-06 | Disposition: A | Discharge status: Home / Self Care | Payer: Medicare Other | Source: Ambulatory Visit | Attending: Adult Health | Admitting: Adult Health

## 2018-01-06 DIAGNOSIS — Z780 Asymptomatic menopausal state: Secondary | ICD-10-CM | POA: Diagnosis not present

## 2018-01-06 DIAGNOSIS — Z1231 Encounter for screening mammogram for malignant neoplasm of breast: Secondary | ICD-10-CM

## 2018-01-06 DIAGNOSIS — E2839 Other primary ovarian failure: Secondary | ICD-10-CM

## 2018-01-06 DIAGNOSIS — M85852 Other specified disorders of bone density and structure, left thigh: Secondary | ICD-10-CM | POA: Diagnosis not present

## 2018-01-08 ENCOUNTER — Other Ambulatory Visit: Payer: Self-pay | Admitting: Adult Health

## 2018-01-08 DIAGNOSIS — R928 Other abnormal and inconclusive findings on diagnostic imaging of breast: Secondary | ICD-10-CM

## 2018-01-13 ENCOUNTER — Other Ambulatory Visit: Payer: Self-pay | Admitting: Adult Health

## 2018-01-13 ENCOUNTER — Ambulatory Visit
Admission: RE | Admit: 2018-01-13 | Discharge: 2018-01-13 | Disposition: A | Discharge status: Home / Self Care | Payer: Medicare Other | Source: Ambulatory Visit | Attending: Adult Health | Admitting: Adult Health

## 2018-01-13 DIAGNOSIS — N6323 Unspecified lump in the left breast, lower outer quadrant: Secondary | ICD-10-CM | POA: Diagnosis not present

## 2018-01-13 DIAGNOSIS — R928 Other abnormal and inconclusive findings on diagnostic imaging of breast: Secondary | ICD-10-CM

## 2018-01-13 DIAGNOSIS — N6320 Unspecified lump in the left breast, unspecified quadrant: Secondary | ICD-10-CM

## 2018-01-19 ENCOUNTER — Other Ambulatory Visit: Payer: Self-pay | Admitting: Internal Medicine

## 2018-01-19 ENCOUNTER — Ambulatory Visit
Admission: RE | Admit: 2018-01-19 | Discharge: 2018-01-19 | Disposition: A | Discharge status: Home / Self Care | Payer: Medicare Other | Source: Ambulatory Visit | Attending: Adult Health | Admitting: Adult Health

## 2018-01-19 DIAGNOSIS — N62 Hypertrophy of breast: Secondary | ICD-10-CM | POA: Diagnosis not present

## 2018-01-19 DIAGNOSIS — N6320 Unspecified lump in the left breast, unspecified quadrant: Secondary | ICD-10-CM

## 2018-01-19 DIAGNOSIS — N6324 Unspecified lump in the left breast, lower inner quadrant: Secondary | ICD-10-CM | POA: Diagnosis not present

## 2018-01-21 NOTE — Telephone Encounter (Signed)
Routing to PCP CMA  

## 2018-01-21 NOTE — Telephone Encounter (Signed)
Cory, you have not filled.  Pt established care in Aug 2019.

## 2018-01-21 NOTE — Telephone Encounter (Signed)
Laurie Dominguez's pt  

## 2018-02-04 ENCOUNTER — Other Ambulatory Visit: Payer: Self-pay

## 2018-02-17 ENCOUNTER — Other Ambulatory Visit: Payer: Self-pay | Admitting: Internal Medicine

## 2018-02-17 NOTE — Telephone Encounter (Signed)
Needs to go to [facility].

## 2018-03-08 ENCOUNTER — Other Ambulatory Visit: Payer: Self-pay | Admitting: Adult Health

## 2018-03-08 NOTE — Telephone Encounter (Signed)
Ok to send in for 90 +1 - CPE is due in [place]

## 2018-03-08 NOTE — Telephone Encounter (Signed)
Laurie Dominguez, you have not filled this in the past.  Please advise or send in for 90 days to [facility].  Was recently sent in to [facility] but this medication is on back order.

## 2018-03-08 NOTE — Telephone Encounter (Signed)
Sent to the pharmacy by e-scribe as instructed. 

## 2018-03-08 NOTE — Telephone Encounter (Signed)
Patient called in and stated she ran out of meds and she wants to know if it can be refilled for 90days.

## 2018-03-16 ENCOUNTER — Ambulatory Visit (INDEPENDENT_AMBULATORY_CARE_PROVIDER_SITE_OTHER): Payer: Medicare Other

## 2018-03-16 ENCOUNTER — Encounter: Payer: Self-pay | Admitting: Adult Health

## 2018-03-16 ENCOUNTER — Ambulatory Visit (INDEPENDENT_AMBULATORY_CARE_PROVIDER_SITE_OTHER): Payer: Medicare Other | Admitting: Adult Health

## 2018-03-16 VITALS — BP 110/64 | HR 105 | Temp 99.9°F

## 2018-03-16 DIAGNOSIS — J189 Pneumonia, unspecified organism: Secondary | ICD-10-CM

## 2018-03-16 MED ORDER — PREDNISONE 10 MG PO TABS: ORAL_TABLET | ORAL | 0 refills | Status: DC

## 2018-03-16 MED ORDER — LEVOFLOXACIN 750 MG PO TABS: 750.0000 mg | ORAL_TABLET | Freq: Every day | ORAL | 0 refills | Status: DC

## 2018-03-16 NOTE — Progress Notes (Signed)
Subjective:    Patient ID: [name], female    DOB: [birth year], 67 y.o.   MRN: [id number]  HPI 67 year old female who  has a past medical history of Colon polyp (08/16/2004), Depression, GERD (gastroesophageal reflux disease), Hypertension, Nephrolithiasis, and Obesity.  She presents to the office today for an acute visit for concern of URI. Her symptoms started 4 days ago with chest congestion, productive cough  with yellow sputum, low grade fevers, SOB and feeling acutely ill. She has a history of COPD and wears nocturnal 02. She has been having to use oxygen during the day. Also using Mucinex.   On presentation her pulse ox was 81% on RA. She bumped up to 96% on 2 L.    Review of Systems See HPI   Past Medical History:  Diagnosis Date  . Colon polyp 08/16/2004   Hyperplastic  . Depression   . GERD (gastroesophageal reflux disease)   . Hypertension   . Nephrolithiasis   . Obesity     Social History   Socioeconomic History  . Marital status: Married    Spouse name: Not on file  . Number of children: Not on file  . Years of education: Not on file  . Highest education level: Not on file  Occupational History  . Not on file  Social Needs  . Financial resource strain: Not on file  . Food insecurity:    Worry: Not on file    Inability: Not on file  . Transportation needs:    Medical: Not on file    Non-medical: Not on file  Tobacco Use  . Smoking status: Former Smoker    Packs/day: 2.00    Years: 40.00    Pack years: 80.00    Types: Cigarettes    Last attempt to quit: 03/24/2006    Years since quitting: 11.9  . Smokeless tobacco: Never Used  Substance and Sexual Activity  . Alcohol use: No    Alcohol/week: 0.0 standard drinks  . Drug use: No  . Sexual activity: Not on file  Lifestyle  . Physical activity:    Days per week: Not on file    Minutes per session: Not on file  . Stress: Not on file  Relationships  . Social connections:    Talks on phone:  Not on file    Gets together: Not on file    Attends religious service: Not on file    Active member of club or organization: Not on file    Attends meetings of clubs or organizations: Not on file    Relationship status: Not on file  . Intimate partner violence:    Fear of current or ex partner: Not on file    Emotionally abused: Not on file    Physically abused: Not on file    Forced sexual activity: Not on file  Other Topics Concern  . Not on file  Social History Narrative  . Not on file    Past Surgical History:  Procedure Laterality Date  . CHOLECYSTECTOMY    . DILATION AND CURETTAGE OF UTERUS    . TONSILLECTOMY      Family History  Problem Relation Age of Onset  . Cancer Mother        unknown primary  . Liver cancer Mother        unknown primary  . Other Father        spinal cord tumor  . Breast cancer Neg Hx  Allergies  Allergen Reactions  . Codeine Phosphate     Current Outpatient Medications on File Prior to Visit  Medication Sig Dispense Refill  . albuterol (PROVENTIL HFA;VENTOLIN HFA) 108 (90 Base) MCG/ACT inhaler Inhale 2 puffs into the lungs every 4 (four) hours as needed for wheezing or shortness of breath. 1 Inhaler 1  . amLODipine (NORVASC) 5 MG tablet TAKE 1 TABLET BY MOUTH  DAILY 90 tablet 3  . budesonide-formoterol (SYMBICORT) 160-4.5 MCG/ACT inhaler Inhale 2 puffs into the lungs 2 (two) times daily. 1 Inhaler 1  . diazepam (VALIUM) 2 MG tablet Take 1 tablet (2 mg total) by mouth at bedtime as needed. (Patient taking differently: Take 2 mg by mouth at bedtime as needed for anxiety or muscle spasms. ) 90 tablet 1  . fluticasone (FLONASE) 50 MCG/ACT nasal spray Place 2 sprays into both nostrils daily. 1 g 0  . furosemide (LASIX) 20 MG tablet TAKE 1 TABLET BY MOUTH  DAILY 90 tablet 3  . hydrocortisone 2.5 % ointment Apply topically 2 (two) times daily. Use 2-4 applications daily 30 g 3  . losartan-hydrochlorothiazide (HYZAAR) 100-25 MG tablet Take 1  tablet by mouth daily. 90 tablet 3  . losartan-hydrochlorothiazide (HYZAAR) 100-25 MG tablet TAKE 1 TABLET BY MOUTH  DAILY 90 tablet 3  . losartan-hydrochlorothiazide (HYZAAR) 100-25 MG tablet TAKE 1 TABLET BY MOUTH ONCE DAILY 90 tablet 0  . meclizine (ANTIVERT) 25 MG tablet Take 1 tablet (25 mg total) by mouth daily as needed. (Patient taking differently: Take 25 mg by mouth 2 (two) times daily as needed for dizziness or nausea. ) 90 tablet 1  . naproxen (NAPROSYN) 500 MG tablet TAKE 1 TABLET BY MOUTH TWO  TIMES DAILY WITH MEALS 180 tablet 3  . omeprazole (PRILOSEC) 20 MG capsule TAKE 1 CAPSULE BY MOUTH  EVERY DAY 90 capsule 3  . PARoxetine (PAXIL) 30 MG tablet TAKE 2 TABLETS BY MOUTH  EVERY MORNING 180 tablet 1  . traMADol (ULTRAM) 50 MG tablet Take 1 tablet (50 mg total) by mouth every 6 (six) hours as needed. (Patient taking differently: Take 50 mg by mouth every 6 (six) hours as needed for moderate pain or severe pain. ) 180 tablet 1  . triamcinolone cream (KENALOG) 0.1 % APPLY  CREAM EXTERNALLY TO AFFECTED AREA TWICE DAILY FOR 2 WEEKS AS NEEDED FOR  FLARES  0   No current facility-administered medications on file prior to visit.     Pulse (!) 105   Temp 99.9 F (37.7 C)   SpO2 (!) 81%       Objective:   Physical Exam Vitals signs and nursing note reviewed.  Constitutional:      Appearance: She is normal weight.  HENT:     Head: Normocephalic and atraumatic.  Cardiovascular:     Rate and Rhythm: Normal rate and regular rhythm.     Pulses: Normal pulses.     Heart sounds: Normal heart sounds.  Pulmonary:     Breath sounds: Normal air entry. Examination of the right-middle field reveals decreased breath sounds. Examination of the left-middle field reveals decreased breath sounds. Examination of the right-lower field reveals decreased breath sounds. Examination of the left-lower field reveals decreased breath sounds. Decreased breath sounds and rhonchi present.  Neurological:      Mental Status: She is alert.       Assessment & Plan:  1. Pneumonia due to infectious organism, unspecified laterality, unspecified part of lung - likely pneumonia. Will get chest xray  and treat  - DG Chest 2 View; Future - levofloxacin (LEVAQUIN) 750 MG tablet; Take 1 tablet (750 mg total) by mouth daily.  Dispense: 7 tablet; Refill: 0 - predniSONE (DELTASONE) 10 MG tablet; 40 mg x 3 days, 20 mg x 3 days, 10 mg x 3 days  Dispense: 21 tablet; Refill: 0 - Return precautions advised. Also advised to go to the ER if not feeling better over the holiday   [name], NP

## 2018-05-14 ENCOUNTER — Other Ambulatory Visit: Payer: Self-pay | Admitting: Internal Medicine

## 2018-05-19 ENCOUNTER — Other Ambulatory Visit: Payer: Self-pay | Admitting: Family Medicine

## 2018-05-19 MED ORDER — PAROXETINE HCL 30 MG PO TABS: 60.0000 mg | ORAL_TABLET | Freq: Every morning | ORAL | 1 refills | Status: DC

## 2018-05-19 NOTE — Telephone Encounter (Signed)
Cory, received numerous warning when I went to sign this medication.  Please review.

## 2018-06-02 ENCOUNTER — Other Ambulatory Visit: Payer: Self-pay | Admitting: Adult Health

## 2018-06-03 MED ORDER — LOSARTAN POTASSIUM-HCTZ 100-25 MG PO TABS: 1.0000 | ORAL_TABLET | Freq: Every day | ORAL | 1 refills | Status: DC

## 2018-06-03 NOTE — Telephone Encounter (Signed)
Sent to the pharmacy by e-scribe. 

## 2018-06-03 NOTE — Addendum Note (Signed)
Addended by: Miles Costain T on: 06/03/2018 11:09 AM   Modules accepted: Orders

## 2018-07-02 ENCOUNTER — Other Ambulatory Visit (INDEPENDENT_AMBULATORY_CARE_PROVIDER_SITE_OTHER): Payer: Self-pay | Admitting: Family Medicine

## 2018-07-27 ENCOUNTER — Telehealth: Payer: Self-pay | Admitting: Adult Health

## 2018-07-27 MED ORDER — OMEPRAZOLE 20 MG PO CPDR: 20.0000 mg | DELAYED_RELEASE_CAPSULE | Freq: Every day | ORAL | 0 refills | Status: DC

## 2018-07-27 MED ORDER — AMLODIPINE BESYLATE 5 MG PO TABS: 5.0000 mg | ORAL_TABLET | Freq: Every day | ORAL | 0 refills | Status: DC

## 2018-07-27 NOTE — Telephone Encounter (Signed)
Sent to the pharmacy by e-scribe. 

## 2018-07-27 NOTE — Telephone Encounter (Signed)
Copied from [place] [id number]. Topic: Quick Communication - Rx Refill/Question >> Jul 27, 2018 10:15 AM Keene Breath wrote: Medication: amLODipine (NORVASC) 5 MG tablet / omeprazole (PRILOSEC) 20 MG capsule  Patient called to request a refill for the above medications  Preferred Pharmacy (with phone number or street name): [facility], [place] [facility] [phone] (Phone) [phone] (Fax)

## 2018-08-05 ENCOUNTER — Other Ambulatory Visit: Payer: Self-pay | Admitting: Adult Health

## 2018-08-05 DIAGNOSIS — N6320 Unspecified lump in the left breast, unspecified quadrant: Secondary | ICD-10-CM

## 2018-08-11 ENCOUNTER — Encounter: Payer: Self-pay | Admitting: Internal Medicine

## 2018-08-24 ENCOUNTER — Ambulatory Visit
Admission: RE | Admit: 2018-08-24 | Discharge: 2018-08-24 | Disposition: A | Discharge status: Home / Self Care | Payer: Medicare Other | Source: Ambulatory Visit | Attending: Adult Health | Admitting: Adult Health

## 2018-08-24 ENCOUNTER — Other Ambulatory Visit: Payer: Self-pay

## 2018-08-24 DIAGNOSIS — N6320 Unspecified lump in the left breast, unspecified quadrant: Secondary | ICD-10-CM

## 2018-09-20 ENCOUNTER — Ambulatory Visit (INDEPENDENT_AMBULATORY_CARE_PROVIDER_SITE_OTHER): Payer: Medicare Other | Admitting: Family Medicine

## 2018-09-20 ENCOUNTER — Other Ambulatory Visit: Payer: Self-pay

## 2018-09-20 ENCOUNTER — Encounter: Payer: Self-pay | Admitting: Family Medicine

## 2018-09-20 VITALS — BP 132/74 | HR 95 | Temp 98.0°F | Ht 62.0 in | Wt 284.7 lb

## 2018-09-20 DIAGNOSIS — M25561 Pain in right knee: Secondary | ICD-10-CM | POA: Diagnosis not present

## 2018-09-20 NOTE — Progress Notes (Signed)
  Subjective:     Patient ID: Laurie Dominguez, female   DOB: 10-14-1950, 68 y.o.   MRN: [id number]  HPI Patient relates relatively acute right knee pain.  Onset over the weekend.  She had her foot propped up on a ottoman for a few hours and then when she went to get down she had some pain.  She took some naproxen and her pain was some better by Sunday.  She had noticed over the weekend that her right foot felt "cold".  Called stating that her knee and foot were cold we had recommended prompt follow-up to further evaluate.  However, she has not had any color changes or any pain or claudication type symptoms.  She denies any calf pain or thigh pain.  No increased dyspnea.  She denies any locking or giving way of the knee.  No ecchymosis.  Pain is somewhat poorly localized but is more posterior and lateral  Past Medical History:  Diagnosis Date  . Colon polyp 08/16/2004   Hyperplastic  . Depression   . GERD (gastroesophageal reflux disease)   . Hypertension   . Nephrolithiasis   . Obesity    Past Surgical History:  Procedure Laterality Date  . CHOLECYSTECTOMY    . DILATION AND CURETTAGE OF UTERUS    . TONSILLECTOMY      reports that she quit smoking about 12 years ago. Her smoking use included cigarettes. She has a 80.00 pack-year smoking history. She has never used smokeless tobacco. She reports that she does not drink alcohol or use drugs. family history includes Cancer in her mother; Liver cancer in her mother; Other in her father. Allergies  Allergen Reactions  . Codeine Phosphate      Review of Systems  Constitutional: Negative for chills and fever.  Respiratory: Negative for cough and shortness of breath.        Objective:   Physical Exam Constitutional:      Appearance: Normal appearance.  Cardiovascular:     Rate and Rhythm: Normal rate and regular rhythm.  Pulmonary:     Effort: Pulmonary effort is normal.     Breath sounds: Normal breath sounds.   Musculoskeletal:     Comments: She has very large knees and legs which makes exam somewhat difficult.  No obvious asymmetry comparing right lower extremity and the left.  Right knee is no warmth.  No ecchymosis.  No obvious effusion.  No erythema.  No localized tenderness.  No calf pain.  Feet are warm to touch with good capillary refill  Neurological:     Mental Status: She is alert.        Assessment:     Acute right knee pain.  She has nonfocal exam at this time    Plan:     -We recommend she continue naproxen and try some icing 15 to 20 minutes 2-3 times daily -Touch base if knee not improving by next week  Eulas Post MD [facility]

## 2018-09-20 NOTE — Patient Instructions (Signed)
Continue with the Naproxen as needed  Consider some icing 15-20 minutes 3 times daily  Follow up in 2 weeks if pain no better.

## 2018-10-26 ENCOUNTER — Encounter: Payer: Self-pay | Admitting: Adult Health

## 2018-10-26 ENCOUNTER — Ambulatory Visit (INDEPENDENT_AMBULATORY_CARE_PROVIDER_SITE_OTHER): Payer: Medicare Other | Admitting: Adult Health

## 2018-10-26 VITALS — BP 130/78 | Temp 98.5°F | Wt 280.0 lb

## 2018-10-26 DIAGNOSIS — Z1211 Encounter for screening for malignant neoplasm of colon: Secondary | ICD-10-CM

## 2018-10-26 DIAGNOSIS — G4733 Obstructive sleep apnea (adult) (pediatric): Secondary | ICD-10-CM | POA: Diagnosis not present

## 2018-10-26 DIAGNOSIS — J449 Chronic obstructive pulmonary disease, unspecified: Secondary | ICD-10-CM | POA: Diagnosis not present

## 2018-10-26 DIAGNOSIS — Z0000 Encounter for general adult medical examination without abnormal findings: Secondary | ICD-10-CM

## 2018-10-26 DIAGNOSIS — K2100 Gastro-esophageal reflux disease with esophagitis, without bleeding: Secondary | ICD-10-CM

## 2018-10-26 DIAGNOSIS — F339 Major depressive disorder, recurrent, unspecified: Secondary | ICD-10-CM

## 2018-10-26 DIAGNOSIS — Z760 Encounter for issue of repeat prescription: Secondary | ICD-10-CM

## 2018-10-26 DIAGNOSIS — I10 Essential (primary) hypertension: Secondary | ICD-10-CM

## 2018-10-26 DIAGNOSIS — R7302 Impaired glucose tolerance (oral): Secondary | ICD-10-CM

## 2018-10-26 LAB — CBC WITH DIFFERENTIAL/PLATELET
Basophils Absolute: 0.1 10*3/uL (ref 0.0–0.1)
Basophils Relative: 0.9 % (ref 0.0–3.0)
Eosinophils Absolute: 0.3 10*3/uL (ref 0.0–0.7)
Eosinophils Relative: 3.4 % (ref 0.0–5.0)
HCT: 40.7 % (ref 36.0–46.0)
Hemoglobin: 13.4 g/dL (ref 12.0–15.0)
Lymphocytes Relative: 18.9 % (ref 12.0–46.0)
Lymphs Abs: 1.4 10*3/uL (ref 0.7–4.0)
MCHC: 32.8 g/dL (ref 30.0–36.0)
MCV: 92.7 fl (ref 78.0–100.0)
Monocytes Absolute: 0.5 10*3/uL (ref 0.1–1.0)
Monocytes Relative: 6.4 % (ref 3.0–12.0)
Neutro Abs: 5.4 10*3/uL (ref 1.4–7.7)
Neutrophils Relative %: 70.4 % (ref 43.0–77.0)
Platelets: 333 10*3/uL (ref 150.0–400.0)
RBC: 4.39 Mil/uL (ref 3.87–5.11)
RDW: 14.3 % (ref 11.5–15.5)
WBC: 7.7 10*3/uL (ref 4.0–10.5)

## 2018-10-26 LAB — COMPREHENSIVE METABOLIC PANEL
ALT: 11 U/L (ref 0–35)
AST: 12 U/L (ref 0–37)
Albumin: 4.1 g/dL (ref 3.5–5.2)
Alkaline Phosphatase: 96 U/L (ref 39–117)
BUN: 21 mg/dL (ref 6–23)
CO2: 37 mEq/L — ABNORMAL HIGH (ref 19–32)
Calcium: 9.3 mg/dL (ref 8.4–10.5)
Chloride: 98 mEq/L (ref 96–112)
Creatinine, Ser: 0.65 mg/dL (ref 0.40–1.20)
GFR: 90.27 mL/min (ref >60.00)
Glucose, Bld: 104 mg/dL — ABNORMAL HIGH (ref 70–99)
Potassium: 3.8 mEq/L (ref 3.5–5.1)
Sodium: 143 mEq/L (ref 135–145)
Total Bilirubin: 0.4 mg/dL (ref 0.2–1.2)
Total Protein: 6.9 g/dL (ref 6.0–8.3)

## 2018-10-26 LAB — LIPID PANEL
Cholesterol: 205 mg/dL — ABNORMAL HIGH (ref 0–200)
HDL: 39 mg/dL — ABNORMAL LOW (ref >39.00)
LDL Cholesterol: 134 mg/dL — ABNORMAL HIGH (ref 0–99)
NonHDL: 166.23
Total CHOL/HDL Ratio: 5
Triglycerides: 159 mg/dL — ABNORMAL HIGH (ref 0.0–149.0)
VLDL: 31.8 mg/dL (ref 0.0–40.0)

## 2018-10-26 LAB — HEMOGLOBIN A1C: Hgb A1c MFr Bld: 5.8 % (ref 4.6–6.5)

## 2018-10-26 LAB — TSH: TSH: 2.05 u[IU]/mL (ref 0.35–4.50)

## 2018-10-26 MED ORDER — ALBUTEROL SULFATE HFA 108 (90 BASE) MCG/ACT IN AERS: 2.0000 | INHALATION_SPRAY | RESPIRATORY_TRACT | 2 refills | Status: DC | PRN

## 2018-10-26 MED ORDER — HYDROCORTISONE 2.5 % EX OINT: TOPICAL_OINTMENT | Freq: Two times a day (BID) | CUTANEOUS | 3 refills | Status: DC

## 2018-10-26 MED ORDER — DIAZEPAM 2 MG PO TABS: 2.0000 mg | ORAL_TABLET | Freq: Every evening | ORAL | 0 refills | Status: DC | PRN

## 2018-10-26 NOTE — Progress Notes (Addendum)
Subjective:    Patient ID: Laurie Dominguez, female    DOB: 1950-10-29, 68 y.o.   MRN: [id number]  HPI Patient presents for yearly preventative medicine examination. Pleasant 68 year old female who  has a past medical history of Colon polyp (08/16/2004), Depression, GERD (gastroesophageal reflux disease), Hypertension, Nephrolithiasis, and Obesity.  OSA - is followed by Pulmonary. She wears a CPAP   COPD- uses nocturnal oxygen and Symbicort   Essential Hypertension - COntrolled with Norvasc 5 mg, Hyzaar 100-25 mg, and Lasix  Depression - Takes Paxil 60 mg daily - feels controlled but is sick of having to stay at home due to Covid   GERD- Controlled with Prilosec 20 mg   Chronic pain in bilateral knees - takes Tramadol 50 mg every 6 hours PRN and Naproxen as needed. She does not ambulate much do to chronic pain.   All immunizations and health maintenance protocols were reviewed with the patient and needed orders were placed.  Appropriate screening laboratory values were ordered for the patient including screening of hyperlipidemia, renal function and hepatic function.  Medication reconciliation,  past medical history, social history, problem list and allergies were reviewed in detail with the patient  Goals were established with regard to weight loss, exercise, and  diet in compliance with medications. She has been spending a lot of time in her pool as she enjoys the exercise. She is trying to eat healthy  Wt Readings from Last 3 Encounters:  10/26/18 280 lb (127 kg)  09/20/18 284 lb 11.2 oz (129.1 kg)  10/28/17 267 lb (121.1 kg)    End of life planning was discussed.  She is up to date on routine dental and vision screens. She did cologuard in 2017 and is due this year    Review of Systems  Constitutional: Negative.   Eyes: Negative.   Respiratory: Positive for shortness of breath ([place] ).   Cardiovascular: Negative.   Gastrointestinal: Negative.   Endocrine:  Negative.   Genitourinary: Negative.   Musculoskeletal: Positive for arthralgias, back pain and gait problem.  Skin: Negative.   Allergic/Immunologic: Negative.   Hematological: Negative.   Psychiatric/Behavioral: Negative.   All other systems reviewed and are negative.  Past Medical History:  Diagnosis Date  . Colon polyp 08/16/2004   Hyperplastic  . Depression   . GERD (gastroesophageal reflux disease)   . Hypertension   . Nephrolithiasis   . Obesity     Social History   Socioeconomic History  . Marital status: Married    Spouse name: Not on file  . Number of children: Not on file  . Years of education: Not on file  . Highest education level: Not on file  Occupational History  . Not on file  Social Needs  . Financial resource strain: Not on file  . Food insecurity    Worry: Not on file    Inability: Not on file  . Transportation needs    Medical: Not on file    Non-medical: Not on file  Tobacco Use  . Smoking status: Former Smoker    Packs/day: 2.00    Years: 40.00    Pack years: 80.00    Types: Cigarettes    Quit date: 03/24/2006    Years since quitting: 12.6  . Smokeless tobacco: Never Used  Substance and Sexual Activity  . Alcohol use: No    Alcohol/week: 0.0 standard drinks  . Drug use: No  . Sexual activity: Not on file  Lifestyle  .  Physical activity    Days per week: Not on file    Minutes per session: Not on file  . Stress: Not on file  Relationships  . Social Herbalist on phone: Not on file    Gets together: Not on file    Attends religious service: Not on file    Active member of club or organization: Not on file    Attends meetings of clubs or organizations: Not on file    Relationship status: Not on file  . Intimate partner violence    Fear of current or ex partner: Not on file    Emotionally abused: Not on file    Physically abused: Not on file    Forced sexual activity: Not on file  Other Topics Concern  . Not on file   Social History Narrative  . Not on file    Past Surgical History:  Procedure Laterality Date  . CHOLECYSTECTOMY    . DILATION AND CURETTAGE OF UTERUS    . TONSILLECTOMY      Family History  Problem Relation Age of Onset  . Cancer Mother        unknown primary  . Liver cancer Mother        unknown primary  . Other Father        spinal cord tumor  . Breast cancer Neg Hx     Allergies  Allergen Reactions  . Codeine Phosphate     Current Outpatient Medications on File Prior to Visit  Medication Sig Dispense Refill  . albuterol (PROVENTIL HFA;VENTOLIN HFA) 108 (90 Base) MCG/ACT inhaler Inhale 2 puffs into the lungs every 4 (four) hours as needed for wheezing or shortness of breath. 1 Inhaler 1  . amLODipine (NORVASC) 5 MG tablet Take 1 tablet (5 mg total) by mouth daily. 90 tablet 0  . budesonide-formoterol (SYMBICORT) 160-4.5 MCG/ACT inhaler Inhale 2 puffs into the lungs 2 (two) times daily. 1 Inhaler 1  . diazepam (VALIUM) 2 MG tablet Take 1 tablet (2 mg total) by mouth at bedtime as needed. (Patient taking differently: Take 2 mg by mouth at bedtime as needed for anxiety or muscle spasms. ) 90 tablet 1  . furosemide (LASIX) 20 MG tablet TAKE 1 TABLET BY MOUTH  DAILY 90 tablet 3  . hydrocortisone 2.5 % ointment Apply topically 2 (two) times daily. Use 2-4 applications daily 30 g 3  . losartan-hydrochlorothiazide (HYZAAR) 100-25 MG tablet Take 1 tablet by mouth daily. 90 tablet 1  . meclizine (ANTIVERT) 25 MG tablet Take 1 tablet (25 mg total) by mouth daily as needed. (Patient taking differently: Take 25 mg by mouth 2 (two) times daily as needed for dizziness or nausea. ) 90 tablet 1  . naproxen (NAPROSYN) 500 MG tablet TAKE 1 TABLET BY MOUTH TWO  TIMES DAILY WITH MEALS 180 tablet 3  . omeprazole (PRILOSEC) 20 MG capsule Take 1 capsule (20 mg total) by mouth daily. 90 capsule 0  . PARoxetine (PAXIL) 30 MG tablet Take 2 tablets (60 mg total) by mouth every morning. 180 tablet 1  .  traMADol (ULTRAM) 50 MG tablet Take 1 tablet (50 mg total) by mouth every 6 (six) hours as needed. (Patient taking differently: Take 50 mg by mouth every 6 (six) hours as needed for moderate pain or severe pain. ) 180 tablet 1  . fluticasone (FLONASE) 50 MCG/ACT nasal spray Place 2 sprays into both nostrils daily. 1 g 0   No current facility-administered medications  on file prior to visit.     BP 130/78 (BP Location: Left Wrist)   Temp 98.5 F (36.9 C)   Wt 280 lb (127 kg)   BMI 51.21 kg/m       Objective:   Physical Exam Vitals signs and nursing note reviewed.  Constitutional:      Appearance: Normal appearance. She is obese.  HENT:     Head: Normocephalic and atraumatic.     Right Ear: Tympanic membrane, ear canal and external ear normal. There is no impacted cerumen.     Left Ear: Tympanic membrane, ear canal and external ear normal.     Nose: Nose normal. No congestion.     Mouth/Throat:     Mouth: Mucous membranes are moist.     Pharynx: Oropharynx is clear.  Eyes:     Extraocular Movements: Extraocular movements intact.     Pupils: Pupils are equal, round, and reactive to light.  Cardiovascular:     Rate and Rhythm: Normal rate and regular rhythm.     Pulses: Normal pulses.     Heart sounds: Normal heart sounds.  Pulmonary:     Effort: Pulmonary effort is normal.     Breath sounds: Normal breath sounds.  Abdominal:     General: Abdomen is flat. Bowel sounds are normal.     Palpations: Abdomen is soft.     Tenderness: There is no abdominal tenderness.     Hernia: No hernia is present.  Musculoskeletal: Normal range of motion.        General: No swelling, tenderness, deformity or signs of injury.     Left lower leg: No edema.     Comments: In motorized wheel chair  Skin:    General: Skin is warm and dry.  Neurological:     General: No focal deficit present.     Mental Status: She is alert and oriented to person, place, and time.     Sensory: No sensory deficit.      Motor: Weakness present.     Coordination: Coordination normal.     Gait: Gait abnormal.  Psychiatric:        Mood and Affect: Mood normal.        Behavior: Behavior normal.        Thought Content: Thought content normal.        Judgment: Judgment normal.       Assessment & Plan:  1. Routine general medical examination at a health care facility - Encouraged weight loss through diet and exercise  - Follow up in one year or sooner if needed - CBC with Differential/Platelet - Comprehensive metabolic panel - Lipid panel - TSH - Hemoglobin A1c  2. Essential hypertension - well controlled. No change in mediations  - CBC with Differential/Platelet - Comprehensive metabolic panel - Lipid panel - TSH - Hemoglobin A1c  3. Obstructive sleep apnea - Continue with CPAP. Follow up with pulmonary as directed  - CBC with Differential/Platelet - Comprehensive metabolic panel - Lipid panel - TSH - Hemoglobin A1c  4. COPD mixed type ([place]) - Continue with pulmonary plan of care - CBC with Differential/Platelet - Comprehensive metabolic panel - Lipid panel - TSH - Hemoglobin A1c  5. Impaired glucose tolerance - Consider metformin  - CBC with Differential/Platelet - Comprehensive metabolic panel - Lipid panel - TSH - Hemoglobin A1c  6. Depression, recurrent ([place]) - Well controlled. No change in medications   7. Gastroesophageal reflux disease with esophagitis - Well controlled. No  change in medications  - CBC with Differential/Platelet - Comprehensive metabolic panel - Lipid panel - TSH - Hemoglobin A1c  8. Morbid obesity ([place]) - Needs significant weight loss. Work on lifestyle modifications  - CBC with Differential/Platelet - Comprehensive metabolic panel - Lipid panel - TSH - Hemoglobin A1c  9. Medication refill  - hydrocortisone 2.5 % ointment; Apply topically 2 (two) times daily. Use 2-4 applications daily  Dispense: 30 g; Refill: 3  10. Colon cancer  screening - Will order Cologuard   [name], NP    [name], NP

## 2018-10-28 ENCOUNTER — Telehealth: Payer: Self-pay | Admitting: Adult Health

## 2018-10-28 ENCOUNTER — Other Ambulatory Visit: Payer: Self-pay | Admitting: Adult Health

## 2018-10-28 ENCOUNTER — Other Ambulatory Visit: Payer: Self-pay | Admitting: Family Medicine

## 2018-10-28 DIAGNOSIS — Z760 Encounter for issue of repeat prescription: Secondary | ICD-10-CM

## 2018-10-28 MED ORDER — SIMVASTATIN 10 MG PO TABS: 10.0000 mg | ORAL_TABLET | Freq: Every day | ORAL | 3 refills | Status: DC

## 2018-10-28 MED ORDER — ALBUTEROL SULFATE HFA 108 (90 BASE) MCG/ACT IN AERS: 2.0000 | INHALATION_SPRAY | Freq: Four times a day (QID) | RESPIRATORY_TRACT | 3 refills | Status: DC | PRN

## 2018-10-28 NOTE — Telephone Encounter (Signed)
Caller name: [facility]  Relation to pt: [facility]  Call back number: pharmacist line [phone] reference # [id number]  Pharmacy: [facility], [place] [phone] (Phone) [phone] (Fax)     Reason for call:  Insurance will not cover albuterol (VENTOLIN HFA) 108 (90 Base) MCG/ACT inhaler but will cover [facility] requesting a new Rx.  Pharmacist in need of clarity regarding hydrocortisone 2.5 % ointment directions, please advise

## 2018-10-29 MED ORDER — HYDROCORTISONE 2.5 % EX OINT: TOPICAL_OINTMENT | Freq: Two times a day (BID) | CUTANEOUS | 3 refills | Status: DC | PRN

## 2018-10-29 MED ORDER — ALBUTEROL SULFATE HFA 108 (90 BASE) MCG/ACT IN AERS: INHALATION_SPRAY | RESPIRATORY_TRACT | 1 refills | Status: DC

## 2018-10-29 NOTE — Telephone Encounter (Signed)
Sent to the pharmacy by 6 months.

## 2018-10-29 NOTE — Telephone Encounter (Signed)
Both prescriptions have been sent to the pharmacy by e-scribe.

## 2018-11-04 ENCOUNTER — Telehealth: Payer: Self-pay | Admitting: Adult Health

## 2018-11-04 NOTE — Telephone Encounter (Signed)
Pts husband stated that pt would like to have a copy of her results from labs that were done on last week.

## 2018-11-09 NOTE — Telephone Encounter (Signed)
Sent to the pt by mail.  Nothing further needed.

## 2018-11-15 LAB — COLOGUARD: Cologuard: NEGATIVE

## 2018-11-18 ENCOUNTER — Other Ambulatory Visit: Payer: Self-pay | Admitting: Adult Health

## 2018-11-18 NOTE — Telephone Encounter (Signed)
Pt called to advise of request from pharmacy for amLODipine (NORVASC) 5 MG tablet And omeprazole (PRILOSEC) 20 MG capsule /please advise

## 2018-11-19 NOTE — Telephone Encounter (Signed)
Sent to the pharmacy by e-scribe. 

## 2018-11-23 ENCOUNTER — Other Ambulatory Visit: Payer: Self-pay | Admitting: Adult Health

## 2018-12-08 ENCOUNTER — Telehealth: Payer: Self-pay

## 2018-12-08 NOTE — Telephone Encounter (Signed)
Recall Cologuard letter sent to address on file.

## 2018-12-22 ENCOUNTER — Other Ambulatory Visit: Payer: Self-pay | Admitting: Adult Health

## 2018-12-22 ENCOUNTER — Encounter: Payer: Self-pay | Admitting: Internal Medicine

## 2018-12-22 ENCOUNTER — Telehealth: Payer: Self-pay | Admitting: Internal Medicine

## 2018-12-22 NOTE — Telephone Encounter (Signed)
I am sending to RN's to help sort out re: what is status of Cologuard

## 2018-12-22 NOTE — Telephone Encounter (Signed)
Dr. Carlean Purl Pt's husband called, they received a letter from [place] on 12-08-18 regarding Cologuard test.  When they called they were under the understanding that they need to schedule an Colon.  However the letter dated 08-11-18 states Colon not recommended. Could you  please review and let us know if colon should be scheduled or an Office visit? Pt is also wanting the results of cologuard.

## 2018-12-22 NOTE — Telephone Encounter (Signed)
If she has submitted the Cologuard we should wait until that is resulted - I do not see the results I most likely sent the letter about the colonoscopy (not having) because she was doing cologuard tests

## 2018-12-22 NOTE — Telephone Encounter (Signed)
Vivien Rota do you know if this pt has cologuard results?

## 2018-12-23 NOTE — Telephone Encounter (Signed)
Pt called again regarding cologuard test and results. She got confused because we sent her a reminder letter about cologuard surveillance. She thought that we had her results. She stated that her PCP order test and she mailed it at the beginning of September. I told her that she needs to check results with her PCP since he is the one who order the test. We will not receive the results because we did not order it. She will check with her PCP. Pt apologized for the confusion.

## 2018-12-23 NOTE — Telephone Encounter (Signed)
On 12-08-2018 the patient was mailed a recall cologuard letter. No cologuard has been ordered. Therefore no results. Looks like its just her 3 yrs surveillance letter.

## 2018-12-23 NOTE — Telephone Encounter (Signed)
Sent to the pharmacy by e-scribe. 

## 2018-12-30 ENCOUNTER — Encounter: Payer: Self-pay | Admitting: Family Medicine

## 2019-01-14 ENCOUNTER — Other Ambulatory Visit (INDEPENDENT_AMBULATORY_CARE_PROVIDER_SITE_OTHER): Payer: Self-pay | Admitting: Family Medicine

## 2019-01-25 ENCOUNTER — Encounter: Payer: Medicare Other | Admitting: Internal Medicine

## 2019-02-11 ENCOUNTER — Other Ambulatory Visit: Payer: Self-pay | Admitting: Adult Health

## 2019-02-11 DIAGNOSIS — Z1231 Encounter for screening mammogram for malignant neoplasm of breast: Secondary | ICD-10-CM

## 2019-03-06 ENCOUNTER — Other Ambulatory Visit: Payer: Self-pay | Admitting: Adult Health

## 2019-03-09 NOTE — Telephone Encounter (Signed)
SENT TO THE PHARMACY BY E-SCRIBE. 

## 2019-04-12 ENCOUNTER — Other Ambulatory Visit: Payer: Self-pay

## 2019-04-12 ENCOUNTER — Ambulatory Visit
Admission: RE | Admit: 2019-04-12 | Discharge: 2019-04-12 | Disposition: A | Discharge status: Home / Self Care | Payer: Medicare Other | Source: Ambulatory Visit | Attending: Adult Health | Admitting: Adult Health

## 2019-04-12 DIAGNOSIS — Z1231 Encounter for screening mammogram for malignant neoplasm of breast: Secondary | ICD-10-CM

## 2019-05-04 ENCOUNTER — Other Ambulatory Visit: Payer: Self-pay | Admitting: Adult Health

## 2019-05-05 NOTE — Telephone Encounter (Signed)
Okay to refill for a year?   Pt last ov 10/26/2018 CPE

## 2019-05-09 DIAGNOSIS — J9601 Acute respiratory failure with hypoxia: Secondary | ICD-10-CM | POA: Diagnosis not present

## 2019-06-06 DIAGNOSIS — J9601 Acute respiratory failure with hypoxia: Secondary | ICD-10-CM | POA: Diagnosis not present

## 2019-07-07 DIAGNOSIS — J9601 Acute respiratory failure with hypoxia: Secondary | ICD-10-CM | POA: Diagnosis not present

## 2019-08-06 DIAGNOSIS — J9601 Acute respiratory failure with hypoxia: Secondary | ICD-10-CM | POA: Diagnosis not present

## 2019-09-06 DIAGNOSIS — J9601 Acute respiratory failure with hypoxia: Secondary | ICD-10-CM | POA: Diagnosis not present

## 2019-09-09 ENCOUNTER — Other Ambulatory Visit: Payer: Self-pay | Admitting: Adult Health

## 2019-09-13 NOTE — Telephone Encounter (Signed)
DENIED.  SENT IN ON 11/19/2018 FOR 1 YEAR.  REQUEST IS TOO EARLY.

## 2019-09-14 ENCOUNTER — Telehealth: Payer: Self-pay | Admitting: Adult Health

## 2019-09-14 MED ORDER — AMLODIPINE BESYLATE 5 MG PO TABS: 5.0000 mg | ORAL_TABLET | Freq: Every day | ORAL | 0 refills | Status: DC

## 2019-09-14 NOTE — Telephone Encounter (Signed)
Pt spouse call and stated her need a refill onamLODipine (NORVASC) 5 MG tablet sent to  [facility], [place], [address] Phone:  [phone]  Fax:  [phone]

## 2019-09-14 NOTE — Telephone Encounter (Signed)
Sent to the pharmacy by e-scribe. 

## 2019-10-06 ENCOUNTER — Telehealth: Payer: Self-pay | Admitting: Adult Health

## 2019-10-06 DIAGNOSIS — J9601 Acute respiratory failure with hypoxia: Secondary | ICD-10-CM | POA: Diagnosis not present

## 2019-10-06 MED ORDER — TRAMADOL HCL 50 MG PO TABS: 50.0000 mg | ORAL_TABLET | Freq: Four times a day (QID) | ORAL | 0 refills | Status: DC | PRN

## 2019-10-06 NOTE — Addendum Note (Signed)
Addended by: Apolinar Junes on: 10/06/2019 11:29 AM   Modules accepted: Orders

## 2019-10-06 NOTE — Telephone Encounter (Signed)
Pt is requesting a refill on Tramadol 50 mg tablet. Pt uses [facility] on [address].

## 2019-10-13 ENCOUNTER — Other Ambulatory Visit: Payer: Self-pay | Admitting: Adult Health

## 2019-10-14 NOTE — Telephone Encounter (Signed)
90 day supply sent to the pharmacy.  Pt has upcoming cpx on 11/08/19.

## 2019-10-18 ENCOUNTER — Other Ambulatory Visit: Payer: Self-pay | Admitting: Adult Health

## 2019-10-19 NOTE — Telephone Encounter (Signed)
Sent to the pharmacy by e-scribe. 

## 2019-11-01 ENCOUNTER — Other Ambulatory Visit: Payer: Self-pay | Admitting: Family Medicine

## 2019-11-01 MED ORDER — TRAMADOL HCL 50 MG PO TABS: 50.0000 mg | ORAL_TABLET | Freq: Four times a day (QID) | ORAL | 2 refills | Status: DC | PRN

## 2019-11-01 NOTE — Telephone Encounter (Signed)
Medication is a controlled substance. I cannot send it in for 90 days. Needs to be 30 days plus 2 refills. I can send it like that to [facility] or keep sending it to local pharmacy

## 2019-11-01 NOTE — Telephone Encounter (Signed)
Request for 90 day supply from [facility]

## 2019-11-01 NOTE — Telephone Encounter (Signed)
SEND 30 DAYS

## 2019-11-03 DIAGNOSIS — L814 Other melanin hyperpigmentation: Secondary | ICD-10-CM | POA: Diagnosis not present

## 2019-11-03 DIAGNOSIS — Z85828 Personal history of other malignant neoplasm of skin: Secondary | ICD-10-CM | POA: Diagnosis not present

## 2019-11-03 DIAGNOSIS — L400 Psoriasis vulgaris: Secondary | ICD-10-CM | POA: Diagnosis not present

## 2019-11-03 DIAGNOSIS — L821 Other seborrheic keratosis: Secondary | ICD-10-CM | POA: Diagnosis not present

## 2019-11-06 DIAGNOSIS — J9601 Acute respiratory failure with hypoxia: Secondary | ICD-10-CM | POA: Diagnosis not present

## 2019-11-08 ENCOUNTER — Other Ambulatory Visit: Payer: Self-pay

## 2019-11-08 ENCOUNTER — Encounter: Payer: Self-pay | Admitting: Adult Health

## 2019-11-08 ENCOUNTER — Other Ambulatory Visit: Payer: Self-pay | Admitting: Adult Health

## 2019-11-08 ENCOUNTER — Ambulatory Visit (INDEPENDENT_AMBULATORY_CARE_PROVIDER_SITE_OTHER): Payer: Medicare Other | Admitting: Adult Health

## 2019-11-08 VITALS — BP 118/62 | Temp 98.1°F | Wt 277.0 lb

## 2019-11-08 DIAGNOSIS — J449 Chronic obstructive pulmonary disease, unspecified: Secondary | ICD-10-CM | POA: Diagnosis not present

## 2019-11-08 DIAGNOSIS — E1169 Type 2 diabetes mellitus with other specified complication: Secondary | ICD-10-CM

## 2019-11-08 DIAGNOSIS — K2100 Gastro-esophageal reflux disease with esophagitis, without bleeding: Secondary | ICD-10-CM

## 2019-11-08 DIAGNOSIS — I10 Essential (primary) hypertension: Secondary | ICD-10-CM

## 2019-11-08 DIAGNOSIS — Z0000 Encounter for general adult medical examination without abnormal findings: Secondary | ICD-10-CM | POA: Diagnosis not present

## 2019-11-08 DIAGNOSIS — E785 Hyperlipidemia, unspecified: Secondary | ICD-10-CM

## 2019-11-08 DIAGNOSIS — G4733 Obstructive sleep apnea (adult) (pediatric): Secondary | ICD-10-CM | POA: Diagnosis not present

## 2019-11-08 DIAGNOSIS — F339 Major depressive disorder, recurrent, unspecified: Secondary | ICD-10-CM

## 2019-11-08 DIAGNOSIS — Z87891 Personal history of nicotine dependence: Secondary | ICD-10-CM

## 2019-11-08 DIAGNOSIS — L409 Psoriasis, unspecified: Secondary | ICD-10-CM

## 2019-11-08 NOTE — Progress Notes (Signed)
Subjective:    Patient ID: Laurie Dominguez, female    DOB: February 18, 1951, 69 y.o.   MRN: [id number]  HPI Patient presents for yearly preventative medicine examination. She is a pleasant 69 year old female who  has a past medical history of Colon polyp (08/16/2004), Depression, GERD (gastroesophageal reflux disease), Hypertension, Nephrolithiasis, and Obesity.  Obstructive sleep apnea- Is unable tolerate CPAP.  She is followed by pulmonary but has not been seen since 2019  COPD-is nocturnal oxygen and Symbicort.  She feels as though her symptoms are well controlled  Essential hypertension-is controlled with Norvasc 5 mg, Hyzaar 100-25 mg, and Lasix.  She denies dizziness, lightheadedness, chest pain, or shortness of breath BP Readings from Last 3 Encounters:  11/08/19 118/62  10/26/18 130/78  09/20/18 132/74   Depression-takes Paxil 60 mg daily and feels well controlled  GERD-controlled with Prilosec 20 mg daily  Osteoarthritis in bilateral knees-takes tramadol 50 mg as needed and naproxen as needed.  She does not ambulate much due to chronic pain  Hyperlipidemia - has been prescribed Simvastatin but stopped taking this due to   Psoriasis - is managed by Dermatology with steroid creams.   All immunizations and health maintenance protocols were reviewed with the patient and needed orders were placed.  Appropriate screening laboratory values were ordered for the patient including screening of hyperlipidemia, renal function and hepatic function. If indicated by BPH, a PSA was ordered.  Medication reconciliation,  past medical history, social history, problem list and allergies were reviewed in detail with the patient  Goals were established with regard to weight loss, exercise, and  diet in compliance with medications. She does spend a lot of time in the pool in the summer, exercise is hard for her due to chronic pain.   Wt Readings from Last 3 Encounters:  11/08/19 277 lb (125.6  kg)  10/26/18 280 lb (127 kg)  09/20/18 284 lb 11.2 oz (129.1 kg)     End of life planning was discussed.  She is up-to-date on routine Cologuard screening, her last Cologuard was in August 2020   Review of Systems  Constitutional: Negative.   HENT: Negative.   Eyes: Negative.   Respiratory: Positive for shortness of breath.   Cardiovascular: Negative.   Gastrointestinal: Negative.   Endocrine: Negative.   Genitourinary: Negative.   Musculoskeletal: Positive for arthralgias and gait problem.  Skin: Negative.   Allergic/Immunologic: Negative.   Hematological: Negative.   Psychiatric/Behavioral: Negative.    Past Medical History:  Diagnosis Date   Colon polyp 08/16/2004   Hyperplastic   Depression    GERD (gastroesophageal reflux disease)    Hypertension    Nephrolithiasis    Obesity     Social History   Socioeconomic History   Marital status: Married    Spouse name: Not on file   Number of children: Not on file   Years of education: Not on file   Highest education level: Not on file  Occupational History   Not on file  Tobacco Use   Smoking status: Former Smoker    Packs/day: 2.00    Years: 40.00    Pack years: 80.00    Types: Cigarettes    Quit date: 03/24/2006    Years since quitting: 13.6   Smokeless tobacco: Never Used  Substance and Sexual Activity   Alcohol use: No    Alcohol/week: 0.0 standard drinks   Drug use: No   Sexual activity: Not on file  Other Topics Concern  Not on file  Social History Narrative   Not on file   Social Determinants of Health   Financial Resource Strain:    Difficulty of Paying Living Expenses:   Food Insecurity:    Worried About Charity fundraiser in the Last Year:    Arboriculturist in the Last Year:   Transportation Needs:    Film/video editor (Medical):    Lack of Transportation (Non-Medical):   Physical Activity:    Days of Exercise per Week:    Minutes of Exercise per  Session:   Stress:    Feeling of Stress :   Social Connections:    Frequency of Communication with Friends and Family:    Frequency of Social Gatherings with Friends and Family:    Attends Religious Services:    Active Member of Clubs or Organizations:    Attends Music therapist:    Marital Status:   Intimate Partner Violence:    Fear of Current or Ex-Partner:    Emotionally Abused:    Physically Abused:    Sexually Abused:     Past Surgical History:  Procedure Laterality Date   BREAST BIOPSY Left 12/2017   CHOLECYSTECTOMY     COLONOSCOPY  2006   DILATION AND CURETTAGE OF UTERUS     TONSILLECTOMY      Family History  Problem Relation Age of Onset   Cancer Mother        unknown primary   Liver cancer Mother        unknown primary   Other Father        spinal cord tumor   Breast cancer Neg Hx     Allergies  Allergen Reactions   Codeine Phosphate     Current Outpatient Medications on File Prior to Visit  Medication Sig Dispense Refill   amLODipine (NORVASC) 5 MG tablet Take 1 tablet (5 mg total) by mouth daily. 90 tablet 0   diazepam (VALIUM) 2 MG tablet Take 1 tablet (2 mg total) by mouth at bedtime as needed for anxiety or muscle spasms. 30 tablet 0   furosemide (LASIX) 20 MG tablet TAKE 1 TABLET BY MOUTH  DAILY 90 tablet 2   hydrocortisone 2.5 % ointment Apply topically 2 (two) times daily as needed. 30 g 3   losartan-hydrochlorothiazide (HYZAAR) 100-25 MG tablet TAKE 1 TABLET BY MOUTH  DAILY 90 tablet 3   meclizine (ANTIVERT) 25 MG tablet Take 1 tablet (25 mg total) by mouth daily as needed. (Patient taking differently: Take 25 mg by mouth 2 (two) times daily as needed for dizziness or nausea. ) 90 tablet 1   naproxen (NAPROSYN) 500 MG tablet TAKE 1 TABLET BY MOUTH TWO  TIMES DAILY WITH MEALS 180 tablet 3   omeprazole (PRILOSEC) 20 MG capsule TAKE 1 CAPSULE BY MOUTH  DAILY 90 capsule 0   PARoxetine (PAXIL) 30 MG tablet  TAKE 2 TABLETS BY MOUTH  EVERY MORNING 180 tablet 1   PARoxetine (PAXIL) 30 MG tablet TAKE 2 TABLETS BY MOUTH IN  THE MORNING 180 tablet 1   PARoxetine (PAXIL) 30 MG tablet TAKE 2 TABLETS BY MOUTH IN  THE MORNING 180 tablet 0   simvastatin (ZOCOR) 10 MG tablet Take 1 tablet (10 mg total) by mouth at bedtime. 90 tablet 3   traMADol (ULTRAM) 50 MG tablet Take 1 tablet (50 mg total) by mouth every 6 (six) hours as needed for moderate pain or severe pain. 30 tablet 2  albuterol (PROAIR HFA) 108 (90 Base) MCG/ACT inhaler Inhale 2 puffs into the lungs every 6 (six) hours as needed for wheezing or shortness of breath 24 g 1   budesonide-formoterol (SYMBICORT) 160-4.5 MCG/ACT inhaler Inhale 2 puffs into the lungs 2 (two) times daily. 1 Inhaler 1   fluticasone (FLONASE) 50 MCG/ACT nasal spray Place 2 sprays into both nostrils daily. 1 g 0   No current facility-administered medications on file prior to visit.    BP 118/62    Temp 98.1 F (36.7 C)    Wt 277 lb (125.6 kg)    BMI 50.66 kg/m       Objective:   Physical Exam Vitals and nursing note reviewed.  Constitutional:      General: She is not in acute distress.    Appearance: Normal appearance. She is well-developed. She is not ill-appearing.  HENT:     Head: Normocephalic and atraumatic.     Right Ear: Tympanic membrane, ear canal and external ear normal. There is no impacted cerumen.     Left Ear: Tympanic membrane, ear canal and external ear normal. There is no impacted cerumen.     Nose: Nose normal. No congestion or rhinorrhea.     Mouth/Throat:     Mouth: Mucous membranes are moist.     Pharynx: Oropharynx is clear. No oropharyngeal exudate or posterior oropharyngeal erythema.  Eyes:     General:        Right eye: No discharge.        Left eye: No discharge.     Extraocular Movements: Extraocular movements intact.     Conjunctiva/sclera: Conjunctivae normal.     Pupils: Pupils are equal, round, and reactive to light.    Neck:     Thyroid: No thyromegaly.     Vascular: No carotid bruit.     Trachea: No tracheal deviation.  Cardiovascular:     Rate and Rhythm: Normal rate and regular rhythm.     Pulses: Normal pulses.     Heart sounds: Normal heart sounds. No murmur heard.  No friction rub. No gallop.   Pulmonary:     Effort: Pulmonary effort is normal. No respiratory distress.     Breath sounds: Normal breath sounds. No stridor. No wheezing, rhonchi or rales.  Chest:     Chest wall: No tenderness.  Abdominal:     General: Abdomen is flat. Bowel sounds are normal. There is no distension.     Palpations: Abdomen is soft. There is no mass.     Tenderness: There is no abdominal tenderness. There is no right CVA tenderness, left CVA tenderness, guarding or rebound.     Hernia: No hernia is present.  Musculoskeletal:        General: No swelling, tenderness, deformity or signs of injury. Normal range of motion.     Cervical back: Normal range of motion and neck supple.     Right lower leg: No edema.     Left lower leg: No edema.  Lymphadenopathy:     Cervical: No cervical adenopathy.  Skin:    General: Skin is warm and dry.     Coloration: Skin is not jaundiced or pale.     Findings: Rash present. No bruising, erythema or lesion.     Comments: Psoriasis patches throughout legs and torso   Neurological:     General: No focal deficit present.     Mental Status: She is alert and oriented to person, place, and time.  Cranial Nerves: No cranial nerve deficit.     Sensory: No sensory deficit.     Motor: No weakness.     Coordination: Coordination normal.     Gait: Gait normal.     Deep Tendon Reflexes: Reflexes normal.     Comments: In motorized wheelchair   Psychiatric:        Mood and Affect: Mood normal.        Behavior: Behavior normal.        Thought Content: Thought content normal.        Judgment: Judgment normal.       Assessment & Plan:  1. Routine general medical examination at a  health care facility - Continue to do as much exercise as possible  - Follow up in one year or sooner if needed - CMP with eGFR([facility]); Future - TSH; Future - Hemoglobin A1c; Future - CBC with Differential/Platelet; Future - Hemoglobin A1c - CBC with Differential/Platelet - TSH - CMP with eGFR([facility])  2. Essential hypertension - Well controlled. No change in medication  - CMP with eGFR([facility]); Future - TSH; Future - Hemoglobin A1c; Future - CBC with Differential/Platelet; Future - Hemoglobin A1c - CBC with Differential/Platelet - TSH - CMP with eGFR([facility])  3. Obstructive sleep apnea - Follow up with pulmonary   4. COPD mixed type ([place]) - Continue with inhale and oxygen at night. Follow up with Pulmonary  - CMP with eGFR([facility]); Future - TSH; Future - Hemoglobin A1c; Future - CBC with Differential/Platelet; Future - Hemoglobin A1c - CBC with Differential/Platelet - TSH - CMP with eGFR([facility])  5. Depression, recurrent ([place]) - Continue with Paxil   6. Gastroesophageal reflux disease with esophagitis, unspecified whether hemorrhage - Continue with prilosec  - CMP with eGFR([facility]); Future - TSH; Future - Hemoglobin A1c; Future - CBC with Differential/Platelet; Future - Hemoglobin A1c - CBC with Differential/Platelet - TSH - CMP with eGFR([facility])  7. Morbid obesity ([place]) - Continue to work out in the pool - CMP with eGFR([facility]); Future - TSH; Future - Hemoglobin A1c; Future - CBC with Differential/Platelet; Future - Hemoglobin A1c - CBC with Differential/Platelet - TSH - CMP with eGFR([facility])  8. Former smoker  - CT CHEST LUNG CA SCREEN LOW DOSE W/O CM; Future  9. Psoriasis - Follow up with dermatology   10. Hyperlipidemia associated with type 2 diabetes mellitus ([place])  - Intolerant to simvastatin. Can trial her on Crestor.  - CMP with eGFR([facility]); Future - Lipid panel; Future - TSH; Future - Hemoglobin A1c; Future - CBC with Differential/Platelet;  Future - Hemoglobin A1c - CBC with Differential/Platelet - TSH - Lipid panel - CMP with eGFR([facility])   [name], NP

## 2019-11-08 NOTE — Patient Instructions (Signed)
It was great seeing you today   We will follow up with you regarding your blood work   Someone will call you to schedule your lung cancer screen

## 2019-11-09 ENCOUNTER — Other Ambulatory Visit: Payer: Self-pay | Admitting: *Deleted

## 2019-11-09 LAB — CBC WITH DIFFERENTIAL/PLATELET
Absolute Monocytes: 547 cells/uL (ref 200–950)
Basophils Absolute: 50 cells/uL (ref 0–200)
Basophils Relative: 0.7 %
Eosinophils Absolute: 180 cells/uL (ref 15–500)
Eosinophils Relative: 2.5 %
HCT: 41.2 % (ref 35.0–45.0)
Hemoglobin: 13.1 g/dL (ref 11.7–15.5)
Lymphs Abs: 1382 cells/uL (ref 850–3900)
MCH: 29.1 pg (ref 27.0–33.0)
MCHC: 31.8 g/dL — ABNORMAL LOW (ref 32.0–36.0)
MCV: 91.6 fL (ref 80.0–100.0)
MPV: 10.3 fL (ref 7.5–12.5)
Monocytes Relative: 7.6 %
Neutro Abs: 5040 cells/uL (ref 1500–7800)
Neutrophils Relative %: 70 %
Platelets: 314 10*3/uL (ref 140–400)
RBC: 4.5 10*6/uL (ref 3.80–5.10)
RDW: 12.8 % (ref 11.0–15.0)
Total Lymphocyte: 19.2 %
WBC: 7.2 10*3/uL (ref 3.8–10.8)

## 2019-11-09 LAB — LIPID PANEL
Cholesterol: 210 mg/dL — ABNORMAL HIGH (ref <200)
HDL: 41 mg/dL — ABNORMAL LOW (ref >=50)
LDL Cholesterol (Calc): 143 mg/dL (calc) — ABNORMAL HIGH
Non-HDL Cholesterol (Calc): 169 mg/dL (calc) — ABNORMAL HIGH (ref <130)
Total CHOL/HDL Ratio: 5.1 (calc) — ABNORMAL HIGH (ref <5.0)
Triglycerides: 137 mg/dL (ref <150)

## 2019-11-09 LAB — COMPLETE METABOLIC PANEL WITH GFR
AG Ratio: 1.4 (calc) (ref 1.0–2.5)
ALT: 8 U/L (ref 6–29)
AST: 12 U/L (ref 10–35)
Albumin: 3.8 g/dL (ref 3.6–5.1)
Alkaline phosphatase (APISO): 86 U/L (ref 37–153)
BUN: 21 mg/dL (ref 7–25)
CO2: 40 mmol/L — ABNORMAL HIGH (ref 20–32)
Calcium: 9.4 mg/dL (ref 8.6–10.4)
Chloride: 97 mmol/L — ABNORMAL LOW (ref 98–110)
Creat: 0.74 mg/dL (ref 0.60–0.93)
GFR, Est African American: 95 mL/min/{1.73_m2} (ref >=60)
GFR, Est Non African American: 82 mL/min/{1.73_m2} (ref >=60)
Globulin: 2.7 g/dL (calc) (ref 1.9–3.7)
Glucose, Bld: 108 mg/dL — ABNORMAL HIGH (ref 65–99)
Potassium: 3.7 mmol/L (ref 3.5–5.3)
Sodium: 146 mmol/L (ref 135–146)
Total Bilirubin: 0.5 mg/dL (ref 0.2–1.2)
Total Protein: 6.5 g/dL (ref 6.1–8.1)

## 2019-11-09 LAB — HEMOGLOBIN A1C
Hgb A1c MFr Bld: 5.8 % of total Hgb — ABNORMAL HIGH (ref <5.7)
Mean Plasma Glucose: 120 (calc)
eAG (mmol/L): 6.6 (calc)

## 2019-11-09 LAB — TSH: TSH: 3.11 mIU/L (ref 0.40–4.50)

## 2019-11-09 MED ORDER — ROSUVASTATIN CALCIUM 5 MG PO TABS
5.0000 mg | ORAL_TABLET | ORAL | 3 refills | Status: DC
Start: 2019-11-09 — End: 2020-06-21

## 2019-11-15 ENCOUNTER — Ambulatory Visit (INDEPENDENT_AMBULATORY_CARE_PROVIDER_SITE_OTHER): Payer: Medicare Other

## 2019-11-15 ENCOUNTER — Other Ambulatory Visit: Payer: Self-pay

## 2019-11-15 ENCOUNTER — Other Ambulatory Visit: Payer: Self-pay | Admitting: Adult Health

## 2019-11-15 DIAGNOSIS — Z0000 Encounter for general adult medical examination without abnormal findings: Secondary | ICD-10-CM | POA: Diagnosis not present

## 2019-11-15 NOTE — Progress Notes (Signed)
Subjective:   Laurie Dominguez is a 69 y.o. female who presents for Medicare Annual (Subsequent) preventive examination.  I connected with Laurie Dominguez today by telephone and verified that I am speaking with the correct person using two identifiers. Location patient: home Location provider: work Persons participating in the virtual visit: patient, provider.   I discussed the limitations, risks, security and privacy concerns of performing an evaluation and management service by telephone and the availability of in person appointments. I also discussed with the patient that there may be a patient responsible charge related to this service. The patient expressed understanding and verbally consented to this telephonic visit.    Interactive audio and video telecommunications were attempted between this provider and patient, however failed, due to patient having technical difficulties OR patient did not have access to video capability.  We continued and completed visit with audio only.     Review of Systems    N/A Cardiac Risk Factors include: advanced age (>41men, >3 women);hypertension     Objective:    Today's Vitals   There is no height or weight on file to calculate BMI.  Advanced Directives 11/15/2019 04/05/2016 04/04/2016  Does Patient Have a Medical Advance Directive? Yes No No  Type of Paramedic of [employer];Living will - -  Does patient want to make changes to medical advance directive? No - Patient declined - -  Copy of [place] in Chart? No - copy requested - -  Would patient like information on creating a medical advance directive? - No - Patient declined -    Current Medications (verified) Outpatient Encounter Medications as of 11/15/2019  Medication Sig  . albuterol (PROAIR HFA) 108 (90 Base) MCG/ACT inhaler Inhale 2 puffs into the lungs every 6 (six) hours as needed for wheezing or shortness of breath  . amLODipine  (NORVASC) 5 MG tablet Take 1 tablet (5 mg total) by mouth daily.  . fluticasone (FLONASE) 50 MCG/ACT nasal spray Place 2 sprays into both nostrils daily.  . furosemide (LASIX) 20 MG tablet TAKE 1 TABLET BY MOUTH  DAILY  . hydrocortisone 2.5 % ointment Apply topically 2 (two) times daily as needed.  Marland Kitchen losartan-hydrochlorothiazide (HYZAAR) 100-25 MG tablet TAKE 1 TABLET BY MOUTH  DAILY  . naproxen (NAPROSYN) 500 MG tablet TAKE 1 TABLET BY MOUTH TWO  TIMES DAILY WITH MEALS  . omeprazole (PRILOSEC) 20 MG capsule TAKE 1 CAPSULE BY MOUTH  DAILY  . PARoxetine (PAXIL) 30 MG tablet TAKE 2 TABLETS BY MOUTH  EVERY MORNING  . rosuvastatin (CRESTOR) 5 MG tablet Take 1 tablet (5 mg total) by mouth every other day.  . traMADol (ULTRAM) 50 MG tablet Take 1 tablet (50 mg total) by mouth every 6 (six) hours as needed for moderate pain or severe pain.  . budesonide-formoterol (SYMBICORT) 160-4.5 MCG/ACT inhaler Inhale 2 puffs into the lungs 2 (two) times daily. (Patient not taking: Reported on 11/15/2019)  . diazepam (VALIUM) 2 MG tablet Take 1 tablet (2 mg total) by mouth at bedtime as needed for anxiety or muscle spasms. (Patient not taking: Reported on 11/15/2019)  . meclizine (ANTIVERT) 25 MG tablet Take 1 tablet (25 mg total) by mouth daily as needed. (Patient not taking: Reported on 11/15/2019)  . [DISCONTINUED] PARoxetine (PAXIL) 30 MG tablet TAKE 2 TABLETS BY MOUTH IN  THE MORNING (Patient not taking: Reported on 11/15/2019)  . [DISCONTINUED] PARoxetine (PAXIL) 30 MG tablet TAKE 2 TABLETS BY MOUTH IN  THE MORNING  No facility-administered encounter medications on file as of 11/15/2019.    Allergies (verified) Codeine phosphate and Simvastatin   History: Past Medical History:  Diagnosis Date  . Colon polyp 08/16/2004   Hyperplastic  . Depression   . GERD (gastroesophageal reflux disease)   . Hypertension   . Nephrolithiasis   . Obesity    Past Surgical History:  Procedure Laterality Date  . BREAST  BIOPSY Left 12/2017  . CHOLECYSTECTOMY    . COLONOSCOPY  2006  . DILATION AND CURETTAGE OF UTERUS    . MOHS SURGERY    . TONSILLECTOMY     Family History  Problem Relation Age of Onset  . Cancer Mother        unknown primary  . Liver cancer Mother        unknown primary  . Other Father        spinal cord tumor  . Breast cancer Neg Hx    Social History   Socioeconomic History  . Marital status: Married    Spouse name: Not on file  . Number of children: Not on file  . Years of education: Not on file  . Highest education level: Not on file  Occupational History  . Not on file  Tobacco Use  . Smoking status: Former Smoker    Packs/day: 2.00    Years: 40.00    Pack years: 80.00    Types: Cigarettes    Quit date: 03/24/2006    Years since quitting: 13.6  . Smokeless tobacco: Never Used  Substance and Sexual Activity  . Alcohol use: No    Alcohol/week: 0.0 standard drinks  . Drug use: No  . Sexual activity: Not on file  Other Topics Concern  . Not on file  Social History Narrative  . Not on file   Social Determinants of Health   Financial Resource Strain: Low Risk   . Difficulty of Paying Living Expenses: Not hard at all  Food Insecurity: No Food Insecurity  . Worried About Charity fundraiser in the Last Year: Never true  . Ran Out of Food in the Last Year: Never true  Transportation Needs: No Transportation Needs  . Lack of Transportation (Medical): No  . Lack of Transportation (Non-Medical): No  Physical Activity: Inactive  . Days of Exercise per Week: 0 days  . Minutes of Exercise per Session: 0 min  Stress: No Stress Concern Present  . Feeling of Stress : Not at all  Social Connections: Moderately Integrated  . Frequency of Communication with Friends and Family: Three times a week  . Frequency of Social Gatherings with Friends and Family: Once a week  . Attends Religious Services: More than 4 times per year  . Active Member of Clubs or Organizations: No    . Attends Archivist Meetings: Never  . Marital Status: Married    Tobacco Counseling Counseling given: Not Answered   Clinical Intake:  Pre-visit preparation completed: Yes  Pain : No/denies pain     Nutritional Risks: None Diabetes: No  How often do you need to have someone help you when you read instructions, pamphlets, or other written materials from your doctor or pharmacy?: 1 - Never What is the last grade level you completed in school?: Some College  Diabetic?No  Interpreter Needed?: No  Information entered by :: [employer] [facility] Daily Living In your present state of health, do you have any difficulty performing the following activities: 11/15/2019  Hearing? Darreld Mclean  Dominguez Has some difficulties with hearing  Vision? N  Difficulty concentrating or making decisions? N  Walking or climbing stairs? Y  Dominguez Has issues with knee pain, and breathing issues  Dressing or bathing? N  Doing errands, shopping? Y  Dominguez Patient has a scooter and her husband goes with her  Preparing Food and eating ? N  Using the Toilet? N  In the past six months, have you accidently leaked urine? Y  Dominguez Has some urine leakage  Do you have problems with loss of bowel control? N  Managing your Medications? N  Managing your Finances? N  Housekeeping or managing your Housekeeping? N  Some recent data might be hidden    Patient Care Team: [name], NP as PCP - General (Family Medicine) Griselda Miner, MD as Consulting Physician (Dermatology)  Indicate any recent Medical Services you may have received from other than [facility] providers in the past year (date may be approximate).     Assessment:   This is a routine wellness examination for [place].  Hearing/Vision screen  Hearing Screening   125Hz  250Hz  500Hz  1000Hz  2000Hz  3000Hz  4000Hz  6000Hz  8000Hz   Right ear:           Left ear:           Vision Screening Comments: Patient states gets eye checked  annually   Dietary issues and exercise activities discussed: Current Exercise Habits: The patient does not participate in regular exercise at present, Exercise limited by: respiratory conditions(s);orthopedic condition(s)  Goals    .  Patient Stated      I will continue to swim when weather permits    .  Weight (lb) < 200 lb (90.7 kg) (pt-stated)      I would like to lose weight       Depression Screen PHQ 2/9 Scores 11/15/2019 11/08/2019 11/08/2019 10/26/2018 09/28/2017 09/26/2015 04/11/2014  PHQ - 2 Score 0 0 0 0 0 1 0  PHQ- 9 Score 0 - - - - - -    Fall Risk Fall Risk  11/15/2019 11/08/2019 11/08/2019 09/28/2017 09/26/2015  Falls in the past year? 0 0 0 No No  Number falls in past yr: 0 - - - -  Injury with Fall? 0 - - - -  Risk for fall due to : Impaired balance/gait;Medication side effect - - - -  Follow up Falls evaluation completed;Falls prevention discussed - - - -    Any stairs in or around the home? Yes  If so, are there any without handrails? No  Home free of loose throw rugs in walkways, pet beds, electrical cords, etc? Yes  Adequate lighting in your home to reduce risk of falls? Yes   ASSISTIVE DEVICES UTILIZED TO PREVENT FALLS:  Life alert? No  Use of a cane, walker or w/c? Yes  Grab bars in the bathroom? Yes  Shower chair or bench in shower? No  Elevated toilet seat or a handicapped toilet? Yes     Cognitive Function:     6CIT Screen 11/15/2019  What Year? 0 points  What month? 0 points  What time? 0 points  Count back from 20 0 points  Months in reverse 0 points  Repeat phrase 0 points  Total Score 0    Immunizations Immunization History  Administered Date(s) Administered  . Influenza Split 01/14/2012, 02/02/2013, 01/19/2017  . Influenza Whole 01/05/1996  . Influenza, High Dose Seasonal PF 02/12/2015, 12/06/2015, 12/29/2017, 12/22/2018  . Influenza,inj,Quad PF,6+ Mos 01/12/2014  . PFIZER  SARS-COV-2 Vaccination 05/16/2019, 06/06/2019  . Pneumococcal  Conjugate-13 09/26/2015, 12/22/2016  . Pneumococcal Polysaccharide-23 09/28/2017, 12/22/2018  . Tdap 10/09/2010  . Zoster Recombinat (Shingrix) 09/28/2017, 12/29/2017    TDAP status: Up to date Flu Vaccine status: Up to date Pneumococcal vaccine status: Up to date Covid-19 vaccine status: Completed vaccines  Qualifies for Shingles Vaccine? Yes   Zostavax completed No   Shingrix Completed?: Yes  Screening Tests Health Maintenance  Topic Date Due  . INFLUENZA VACCINE  10/23/2019  . TETANUS/TDAP  10/08/2020  . MAMMOGRAM  04/11/2021  . Fecal DNA (Cologuard)  11/14/2021  . DEXA SCAN  Completed  . COVID-19 Vaccine  Completed  . Hepatitis C Screening  Completed  . PNA vac Low Risk Adult  Completed    Health Maintenance  Health Maintenance Due  Topic Date Due  . INFLUENZA VACCINE  10/23/2019    Colorectal cancer screening: Completed cologuard 11/14/2018. Repeat every 3 years Mammogram status: Completed 04/12/2019. Repeat every year Bone Density status: Completed 01/06/2018. Results reflect: Bone density results: OSTEOPENIA. Repeat every 5 years.  Lung Cancer Screening: (Low Dose CT Chest recommended if Age 63-80 years, 30 pack-year currently smoking OR have quit w/in 15years.) does not qualify.   Lung Cancer Screening Referral: N/A  Additional Screening:  Hepatitis C Screening: does qualify; Completed 09/28/2017  Vision Screening: Recommended annual ophthalmology exams for early detection of glaucoma and other disorders of the eye. Is the patient up to date with their annual eye exam?  Yes  Who is the provider or what is the name of the office in which the patient attends annual eye exams? Dr. Sabra Heck If pt is not established with a provider, would they like to be referred to a provider to establish care? No .   Dental Screening: Recommended annual dental exams for proper oral hygiene  Community Resource Referral / Chronic Care Management: CRR required this visit?  No    CCM required this visit?  No      Plan:     I have personally reviewed and noted the following in the patient's chart:   . Medical and social history . Use of alcohol, tobacco or illicit drugs  . Current medications and supplements . Functional ability and status . Nutritional status . Physical activity . Advanced directives . List of other physicians . Hospitalizations, surgeries, and ER visits in previous 12 months . Vitals . Screenings to include cognitive, depression, and falls . Referrals and appointments  In addition, I have reviewed and discussed with patient certain preventive protocols, quality metrics, and best practice recommendations. A written personalized care plan for preventive services as well as general preventive health recommendations were provided to patient.     Ofilia Neas, LPN   [id number]   Nurse Notes: None

## 2019-11-15 NOTE — Patient Instructions (Signed)
Laurie Dominguez , Thank you for taking time to come for your Medicare Wellness Visit. I appreciate your ongoing commitment to your health goals. Please review the following plan we discussed and let me know if I can assist you in the future.   Screening recommendations/referrals: Colonoscopy: Up to date, next cologuard due 11/13/2021 Mammogram: up to date, next due 04/11/2020 Bone Density: Up to date, next due 01/06/2022 Recommended yearly ophthalmology/optometry visit for glaucoma screening and checkup Recommended yearly dental visit for hygiene and checkup  Vaccinations: Influenza vaccine: Up to date, next due this fall Pneumococcal vaccine: completed series Tdap vaccine: Up to date, next due 10/08/2020 Shingles vaccine: completed series    Advanced directives: Please bring a copy of your advanced directives in to your next appointment so that we may scan them into your chart  Conditions/risks identified: None  Next appointment: None   Preventive Care 65 Years and Older, Female Preventive care refers to lifestyle choices and visits with your health care provider that can promote health and wellness. What does preventive care include?  A yearly physical exam. This is also called an annual well check.  Dental exams once or twice a year.  Routine eye exams. Ask your health care provider how often you should have your eyes checked.  Personal lifestyle choices, including:  Daily care of your teeth and gums.  Regular physical activity.  Eating a healthy diet.  Avoiding tobacco and drug use.  Limiting alcohol use.  Practicing safe sex.  Taking low-dose aspirin every day.  Taking vitamin and mineral supplements as recommended by your health care provider. What happens during an annual well check? The services and screenings done by your health care provider during your annual well check will depend on your age, overall health, lifestyle risk factors, and family history of  disease. Counseling  Your health care provider may ask you questions about your:  Alcohol use.  Tobacco use.  Drug use.  Emotional well-being.  Home and relationship well-being.  Sexual activity.  Eating habits.  History of falls.  Memory and ability to understand (cognition).  Work and work Statistician.  Reproductive health. Screening  You may have the following tests or measurements:  Height, weight, and BMI.  Blood pressure.  Lipid and cholesterol levels. These may be checked every 5 years, or more frequently if you are over 108 years old.  Skin check.  Lung cancer screening. You may have this screening every year starting at age 64 if you have a 30-pack-year history of smoking and currently smoke or have quit within the past 15 years.  Fecal occult blood test (FOBT) of the stool. You may have this test every year starting at age 47.  Flexible sigmoidoscopy or colonoscopy. You may have a sigmoidoscopy every 5 years or a colonoscopy every 10 years starting at age 60.  Hepatitis C blood test.  Hepatitis B blood test.  Sexually transmitted disease (STD) testing.  Diabetes screening. This is done by checking your blood sugar (glucose) after you have not eaten for a while (fasting). You may have this done every 1-3 years.  Bone density scan. This is done to screen for osteoporosis. You may have this done starting at age 43.  Mammogram. This may be done every 1-2 years. Talk to your health care provider about how often you should have regular mammograms. Talk with your health care provider about your test results, treatment options, and if necessary, the need for more tests. Vaccines  Your health care  provider may recommend certain vaccines, such as:  Influenza vaccine. This is recommended every year.  Tetanus, diphtheria, and acellular pertussis (Tdap, Td) vaccine. You may need a Td booster every 10 years.  Zoster vaccine. You may need this after age  8.  Pneumococcal 13-valent conjugate (PCV13) vaccine. One dose is recommended after age 63.  Pneumococcal polysaccharide (PPSV23) vaccine. One dose is recommended after age 31. Talk to your health care provider about which screenings and vaccines you need and how often you need them. This information is not intended to replace advice given to you by your health care provider. Make sure you discuss any questions you have with your health care provider. Document Released: 04/06/2015 Document Revised: 11/28/2015 Document Reviewed: 01/09/2015 Elsevier Interactive Patient Education  2017 [employer] Prevention in the Home Falls can cause injuries. They can happen to people of all ages. There are many things you can do to make your home safe and to help prevent falls. What can I do on the outside of my home?  Regularly fix the edges of walkways and driveways and fix any cracks.  Remove anything that might make you trip as you walk through a door, such as a raised step or threshold.  Trim any bushes or trees on the path to your home.  Use bright outdoor lighting.  Clear any walking paths of anything that might make someone trip, such as rocks or tools.  Regularly check to see if handrails are loose or broken. Make sure that both sides of any steps have handrails.  Any raised decks and porches should have guardrails on the edges.  Have any leaves, snow, or ice cleared regularly.  Use sand or salt on walking paths during winter.  Clean up any spills in your garage right away. This includes oil or grease spills. What can I do in the bathroom?  Use night lights.  Install grab bars by the toilet and in the tub and shower. Do not use towel bars as grab bars.  Use non-skid mats or decals in the tub or shower.  If you need to sit down in the shower, use a plastic, non-slip stool.  Keep the floor dry. Clean up any water that spills on the floor as soon as it happens.  Remove  soap buildup in the tub or shower regularly.  Attach bath mats securely with double-sided non-slip rug tape.  Do not have throw rugs and other things on the floor that can make you trip. What can I do in the bedroom?  Use night lights.  Make sure that you have a light by your bed that is easy to reach.  Do not use any sheets or blankets that are too big for your bed. They should not hang down onto the floor.  Have a firm chair that has side arms. You can use this for support while you get dressed.  Do not have throw rugs and other things on the floor that can make you trip. What can I do in the kitchen?  Clean up any spills right away.  Avoid walking on wet floors.  Keep items that you use a lot in easy-to-reach places.  If you need to reach something above you, use a strong step stool that has a grab bar.  Keep electrical cords out of the way.  Do not use floor polish or wax that makes floors slippery. If you must use wax, use non-skid floor wax.  Do not have throw  rugs and other things on the floor that can make you trip. What can I do with my stairs?  Do not leave any items on the stairs.  Make sure that there are handrails on both sides of the stairs and use them. Fix handrails that are broken or loose. Make sure that handrails are as long as the stairways.  Check any carpeting to make sure that it is firmly attached to the stairs. Fix any carpet that is loose or worn.  Avoid having throw rugs at the top or bottom of the stairs. If you do have throw rugs, attach them to the floor with carpet tape.  Make sure that you have a light switch at the top of the stairs and the bottom of the stairs. If you do not have them, ask someone to add them for you. What else can I do to help prevent falls?  Wear shoes that:  Do not have high heels.  Have rubber bottoms.  Are comfortable and fit you well.  Are closed at the toe. Do not wear sandals.  If you use a  stepladder:  Make sure that it is fully opened. Do not climb a closed stepladder.  Make sure that both sides of the stepladder are locked into place.  Ask someone to hold it for you, if possible.  Clearly mark and make sure that you can see:  Any grab bars or handrails.  First and last steps.  Where the edge of each step is.  Use tools that help you move around (mobility aids) if they are needed. These include:  Canes.  Walkers.  Scooters.  Crutches.  Turn on the lights when you go into a dark area. Replace any light bulbs as soon as they burn out.  Set up your furniture so you have a clear path. Avoid moving your furniture around.  If any of your floors are uneven, fix them.  If there are any pets around you, be aware of where they are.  Review your medicines with your doctor. Some medicines can make you feel dizzy. This can increase your chance of falling. Ask your doctor what other things that you can do to help prevent falls. This information is not intended to replace advice given to you by your health care provider. Make sure you discuss any questions you have with your health care provider. Document Released: 01/04/2009 Document Revised: 08/16/2015 Document Reviewed: 04/14/2014 Elsevier Interactive Patient Education  2017 [employer].

## 2019-11-16 ENCOUNTER — Other Ambulatory Visit: Payer: Self-pay | Admitting: *Deleted

## 2019-11-16 DIAGNOSIS — Z87891 Personal history of nicotine dependence: Secondary | ICD-10-CM

## 2019-11-21 ENCOUNTER — Other Ambulatory Visit: Payer: Self-pay | Admitting: Adult Health

## 2019-12-07 ENCOUNTER — Other Ambulatory Visit: Payer: Self-pay

## 2019-12-07 ENCOUNTER — Ambulatory Visit
Admission: RE | Admit: 2019-12-07 | Discharge: 2019-12-07 | Disposition: A | Discharge status: Home / Self Care | Payer: Medicare Other | Source: Ambulatory Visit | Attending: Acute Care | Admitting: Acute Care

## 2019-12-07 ENCOUNTER — Ambulatory Visit (INDEPENDENT_AMBULATORY_CARE_PROVIDER_SITE_OTHER): Payer: Medicare Other | Admitting: Acute Care

## 2019-12-07 ENCOUNTER — Encounter: Payer: Self-pay | Admitting: Acute Care

## 2019-12-07 VITALS — BP 120/70 | HR 95 | Temp 97.3°F | Ht 64.0 in | Wt 280.4 lb

## 2019-12-07 DIAGNOSIS — I281 Aneurysm of pulmonary artery: Secondary | ICD-10-CM | POA: Diagnosis not present

## 2019-12-07 DIAGNOSIS — Z122 Encounter for screening for malignant neoplasm of respiratory organs: Secondary | ICD-10-CM

## 2019-12-07 DIAGNOSIS — I2510 Atherosclerotic heart disease of native coronary artery without angina pectoris: Secondary | ICD-10-CM | POA: Diagnosis not present

## 2019-12-07 DIAGNOSIS — J9601 Acute respiratory failure with hypoxia: Secondary | ICD-10-CM | POA: Diagnosis not present

## 2019-12-07 DIAGNOSIS — Z87891 Personal history of nicotine dependence: Secondary | ICD-10-CM

## 2019-12-07 DIAGNOSIS — I2721 Secondary pulmonary arterial hypertension: Secondary | ICD-10-CM | POA: Diagnosis not present

## 2019-12-07 MED ORDER — BUDESONIDE-FORMOTEROL FUMARATE 160-4.5 MCG/ACT IN AERO: 2.0000 | INHALATION_SPRAY | Freq: Two times a day (BID) | RESPIRATORY_TRACT | 5 refills | Status: DC

## 2019-12-07 NOTE — Progress Notes (Signed)
Shared Decision Making Visit Lung Cancer Screening Program [phone])   Eligibility:  Age 69 y.o.  Pack Years Smoking History Calculation 75 pack year smoking history (# packs/per year x # years smoked)  Recent History of coughing up blood  no  Unexplained weight loss? no ( >Than 15 pounds within the last 6 months )  Prior History Lung / other cancer no (Diagnosis within the last 5 years already requiring surveillance chest CT Scans).  Smoking Status Former Smoker  Former Smokers: Years since quit:11  Quit Date: 2010  Visit Components:  Discussion included one or more decision making aids. yes  Discussion included risk/benefits of screening. yes  Discussion included potential follow up diagnostic testing for abnormal scans. yes  Discussion included meaning and risk of over diagnosis. yes  Discussion included meaning and risk of False Positives. yes  Discussion included meaning of total radiation exposure. yes  Counseling Included:  Importance of adherence to annual lung cancer LDCT screening. yes  Impact of comorbidities on ability to participate in the program. yes  Ability and willingness to under diagnostic treatment. yes  Smoking Cessation Counseling:  Current Smokers:   Discussed importance of smoking cessation. yes  Information about tobacco cessation classes and interventions provided to patient. yes  Patient provided with "ticket" for LDCT Scan. yes  Symptomatic Patient. no  Counseling  Diagnosis Code: Tobacco Use Z72.0  Asymptomatic Patient yes  Counseling (Intermediate counseling: > three minutes counseling) [id number]  Former Smokers:   Discussed the importance of maintaining cigarette abstinence. yes  Diagnosis Code: Personal History of Nicotine Dependence. D14.970  Information about tobacco cessation classes and interventions provided to patient. Yes  Patient provided with "ticket" for LDCT Scan. yes  Written Order for Lung Cancer  Screening with LDCT placed in Epic. Yes (CT Chest Lung Cancer Screening Low Dose W/O CM) [id number] Z12.2-Screening of respiratory organs Z87.891-Personal history of nicotine dependence  BP 120/70 (BP Location: Left Arm, Cuff Size: Normal)   Pulse 95   Temp (!) 97.3 F (36.3 C) (Oral)   Ht 5\' 4"  (1.626 m)   Wt 280 lb 6.4 oz (127.2 kg)   SpO2 91%   BMI 48.13 kg/m    I spent 25 minutes of face to face time with Laurie Dominguez discussing the risks and benefits of lung cancer screening. We viewed a power point together that explained in detail the above noted topics. We took the time to pause the power point at intervals to allow for questions to be asked and answered to ensure understanding. We discussed that she had taken the single most powerful action possible to decrease her risk of developing lung cancer when she quit smoking. I counseled her to remain smoke free, and to contact me if she ever had the desire to smoke again so that I can provide resources and tools to help support the effort to remain smoke free. We discussed the time and location of the scan, and that either  Laurie Glassman RN or I will call with the results within  24-48 hours of receiving them. She has my card and contact information in the event she needs to speak with me, in addition to a copy of the power point we reviewed as a resource. She verbalized understanding of all of the above and had no further questions upon leaving the office.     I explained to the patient that there has been a high incidence of coronary artery disease noted on these exams. I explained that  this is a non-gated exam therefore degree or severity cannot be determined. This patient is currently on statin therapy. I have asked the patient to follow-up with their PCP regarding any incidental finding of coronary artery disease and management with diet or medication as they feel is clinically indicated. The patient verbalized understanding of the above and had  no further questions.     Magdalen Spatz, NP 12/07/2019 10:09 PM

## 2019-12-07 NOTE — Patient Instructions (Signed)
Thank you for participating in the [facility]. It was our pleasure to meet you today. We will call you with the results of your scan within the next few days. Your scan will be assigned a Lung RADS category score by the physicians reading the scans.  This Lung RADS score determines follow up scanning.  See below for description of categories, and follow up screening recommendations. We will be in touch to schedule your follow up screening annually or based on recommendations of our providers. We will fax a copy of your scan results to your Primary Care Physician, or the physician who referred you to the program, to ensure they have the results. Please call the office if you have any questions or concerns regarding your scanning experience or results.  Our office number is [phone]. Please speak with Denise Phelps, RN. She is our Lung Cancer Screening RN. If she is unavailable when you call, please have the office staff send her a message. She will return your call at her earliest convenience. Remember, if your scan is normal, we will scan you annually as long as you continue to meet the criteria for the program. (Age 55-77, Current smoker or smoker who has quit within the last 15 years). If you are a smoker, remember, quitting is the single most powerful action that you can take to decrease your risk of lung cancer and other pulmonary, breathing related problems. We know quitting is hard, and we are here to help.  Please let us know if there is anything we can do to help you meet your goal of quitting. If you are a former smoker, congratulations. We are proud of you! Remain smoke free! Remember you can refer friends or family members through the number above.  We will screen them to make sure they meet criteria for the program. Thank you for helping us take better care of you by participating in Lung Screening.  Lung RADS Categories:  Lung RADS 1: no nodules  or definitely non-concerning nodules.  Recommendation is for a repeat annual scan in 12 months.  Lung RADS 2:  nodules that are non-concerning in appearance and behavior with a very low likelihood of becoming an active cancer. Recommendation is for a repeat annual scan in 12 months.  Lung RADS 3: nodules that are probably non-concerning , includes nodules with a low likelihood of becoming an active cancer.  Recommendation is for a 6-month repeat screening scan. Often noted after an upper respiratory illness. We will be in touch to make sure you have no questions, and to schedule your 6-month scan.  Lung RADS 4 A: nodules with concerning findings, recommendation is most often for a follow up scan in 3 months or additional testing based on our provider's assessment of the scan. We will be in touch to make sure you have no questions and to schedule the recommended 3 month follow up scan.  Lung RADS 4 B:  indicates findings that are concerning. We will be in touch with you to schedule additional diagnostic testing based on our provider's  assessment of the scan.   

## 2019-12-15 NOTE — Progress Notes (Signed)
Please call patient and let them  know their  low dose Ct was read as a Lung RADS 1, negative study: no nodules or definitely benign nodules. Radiology recommendation is for a repeat LDCT in 12 months. .Please let them  know we will order and schedule their  annual screening scan for 11/2020. Please let them  know there was notation of CAD on their  scan.  Please remind the patient  that this is a non-gated exam therefore degree or severity of disease  cannot be determined. Please have them  follow up with their PCP regarding potential risk factor modification, dietary therapy or pharmacologic therapy if clinically indicated. Pt.  is  currently on statin therapy. Please place order for annual  screening scan for  12/2020 and fax results to PCP. Thanks so much.  Dr. 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 with history of OSA on CPAP. Last echo was 2018 with poor visualization of the pulmonary artery.   Please follow up as you feel is clinically indicated. Let me know if I can help. Thanks so much

## 2019-12-16 ENCOUNTER — Other Ambulatory Visit: Payer: Self-pay | Admitting: *Deleted

## 2019-12-16 DIAGNOSIS — Z87891 Personal history of nicotine dependence: Secondary | ICD-10-CM

## 2020-01-03 ENCOUNTER — Other Ambulatory Visit: Payer: Self-pay | Admitting: Adult Health

## 2020-01-06 DIAGNOSIS — J9601 Acute respiratory failure with hypoxia: Secondary | ICD-10-CM | POA: Diagnosis not present

## 2020-01-11 ENCOUNTER — Encounter: Payer: Self-pay | Admitting: Internal Medicine

## 2020-01-11 ENCOUNTER — Other Ambulatory Visit: Payer: Self-pay

## 2020-01-11 ENCOUNTER — Ambulatory Visit (INDEPENDENT_AMBULATORY_CARE_PROVIDER_SITE_OTHER): Payer: Medicare Other

## 2020-01-11 ENCOUNTER — Ambulatory Visit (INDEPENDENT_AMBULATORY_CARE_PROVIDER_SITE_OTHER): Payer: Medicare Other | Admitting: Internal Medicine

## 2020-01-11 VITALS — BP 130/70 | HR 85 | Temp 98.4°F | Resp 20 | Ht 64.0 in | Wt 281.2 lb

## 2020-01-11 DIAGNOSIS — M19012 Primary osteoarthritis, left shoulder: Secondary | ICD-10-CM | POA: Diagnosis not present

## 2020-01-11 DIAGNOSIS — E662 Morbid (severe) obesity with alveolar hypoventilation: Secondary | ICD-10-CM

## 2020-01-11 DIAGNOSIS — M25512 Pain in left shoulder: Secondary | ICD-10-CM

## 2020-01-11 DIAGNOSIS — Z7409 Other reduced mobility: Secondary | ICD-10-CM | POA: Diagnosis not present

## 2020-01-11 NOTE — Progress Notes (Signed)
Chief Complaint  Patient presents with  . Acute Visit    shoulder and arm pain, left     HPI: Laurie Dominguez 69 y.o. come in for  sda  PCP appt NA  A few weeks onset left shoulder pain and arm without fall or specific trauma although could have strained when trying to get out of bath tub. Some stiffness and rom issues  Elevation and rotation.  Also recent right lateral  Tender  Not radiating  Or assoc sx  Tried  tarmadol  Some  ROS: See pertinent positives and negatives per HPI.  Past Medical History:  Diagnosis Date  . Colon polyp 08/16/2004   Hyperplastic  . Depression   . GERD (gastroesophageal reflux disease)   . Hypertension   . Nephrolithiasis   . Obesity     Family History  Problem Relation Age of Onset  . Cancer Mother        unknown primary  . Liver cancer Mother        unknown primary  . Other Father        spinal cord tumor  . Breast cancer Neg Hx     Social History   Socioeconomic History  . Marital status: Married    Spouse name: Not on file  . Number of children: Not on file  . Years of education: Not on file  . Highest education level: Not on file  Occupational History  . Not on file  Tobacco Use  . Smoking status: Former Smoker    Packs/day: 2.00    Years: 40.00    Pack years: 80.00    Types: Cigarettes    Quit date: 03/24/2006    Years since quitting: 13.8  . Smokeless tobacco: Never Used  Substance and Sexual Activity  . Alcohol use: No    Alcohol/week: 0.0 standard drinks  . Drug use: No  . Sexual activity: Not on file  Other Topics Concern  . Not on file  Social History Narrative  . Not on file   Social Determinants of Health   Financial Resource Strain: Low Risk   . Difficulty of Paying Living Expenses: Not hard at all  Food Insecurity: No Food Insecurity  . Worried About Charity fundraiser in the Last Year: Never true  . Ran Out of Food in the Last Year: Never true  Transportation Needs: No Transportation Needs  .  Lack of Transportation (Medical): No  . Lack of Transportation (Non-Medical): No  Physical Activity: Inactive  . Days of Exercise per Week: 0 days  . Minutes of Exercise per Session: 0 min  Stress: No Stress Concern Present  . Feeling of Stress : Not at all  Social Connections: Moderately Integrated  . Frequency of Communication with Friends and Family: Three times a week  . Frequency of Social Gatherings with Friends and Family: Once a week  . Attends Religious Services: More than 4 times per year  . Active Member of Clubs or Organizations: No  . Attends Archivist Meetings: Never  . Marital Status: Married    Outpatient Medications Prior to Visit  Medication Sig Dispense Refill  . albuterol (PROAIR HFA) 108 (90 Base) MCG/ACT inhaler Inhale 2 puffs into the lungs every 6 (six) hours as needed for wheezing or shortness of breath 24 g 1  . amLODipine (NORVASC) 5 MG tablet TAKE 1 TABLET BY MOUTH  DAILY 90 tablet 3  . budesonide-formoterol (SYMBICORT) 160-4.5 MCG/ACT inhaler Inhale 2 puffs  into the lungs 2 (two) times daily. 1 each 5  . diazepam (VALIUM) 2 MG tablet Take 1 tablet (2 mg total) by mouth at bedtime as needed for anxiety or muscle spasms. 30 tablet 0  . furosemide (LASIX) 20 MG tablet TAKE 1 TABLET BY MOUTH  DAILY 90 tablet 3  . hydrocortisone 2.5 % ointment Apply topically 2 (two) times daily as needed. 30 g 3  . losartan-hydrochlorothiazide (HYZAAR) 100-25 MG tablet TAKE 1 TABLET BY MOUTH  DAILY 90 tablet 3  . meclizine (ANTIVERT) 25 MG tablet Take 1 tablet (25 mg total) by mouth daily as needed. 90 tablet 1  . naproxen (NAPROSYN) 500 MG tablet TAKE 1 TABLET BY MOUTH TWO  TIMES DAILY WITH MEALS 180 tablet 3  . omeprazole (PRILOSEC) 20 MG capsule TAKE 1 CAPSULE BY MOUTH  DAILY 90 capsule 0  . PARoxetine (PAXIL) 30 MG tablet TAKE 2 TABLETS BY MOUTH IN  THE MORNING 180 tablet 3  . rosuvastatin (CRESTOR) 5 MG tablet Take 1 tablet (5 mg total) by mouth every other day.  90 tablet 3  . traMADol (ULTRAM) 50 MG tablet Take 1 tablet (50 mg total) by mouth every 6 (six) hours as needed for moderate pain or severe pain. 30 tablet 2  . fluticasone (FLONASE) 50 MCG/ACT nasal spray Place 2 sprays into both nostrils daily. 1 g 0   No facility-administered medications prior to visit.     EXAM:  BP 130/70 (BP Location: Left Wrist, Cuff Size: Normal)   Pulse 85   Temp 98.4 F (36.9 C) (Oral)   Resp 20   Ht 5\' 4"  (1.626 m)   Wt 281 lb 3.2 oz (127.6 kg)   SpO2 93%   BMI 48.27 kg/m   Body mass index is 48.27 kg/m.  GENERAL: vitals reviewed and listed above, alert, oriented, appears well hydrated and in no acute distress in wc mask  On o2  HEENT: atraumatic, conjunctiva  clear, no obvious abnormalities on inspection of external nose and ears OP : masked NECK: no obvious masses on inspection palpation  CV: HRRR, no clubbing cyanosis  Left shoulder  Stiffness  Ant deltoid  Tenderness  No bony tenderness pao cross body and rotational pain  MS: moves all extremities PSYCH: pleasant and cooperative, no obvious depression or anxiety Lab Results  Component Value Date   WBC 7.2 11/08/2019   HGB 13.1 11/08/2019   HCT 41.2 11/08/2019   PLT 314 11/08/2019   GLUCOSE 108 (H) 11/08/2019   CHOL 210 (H) 11/08/2019   TRIG 137 11/08/2019   HDL 41 (L) 11/08/2019   LDLDIRECT 150.5 06/20/2008   LDLCALC 143 (H) 11/08/2019   ALT 8 11/08/2019   AST 12 11/08/2019   NA 146 11/08/2019   K 3.7 11/08/2019   CL 97 (L) 11/08/2019   CREATININE 0.74 11/08/2019   BUN 21 11/08/2019   CO2 40 (H) 11/08/2019   TSH 3.11 11/08/2019   HGBA1C 5.8 (H) 11/08/2019   BP Readings from Last 3 Encounters:  01/11/20 130/70  12/07/19 120/70  11/08/19 118/62    ASSESSMENT AND PLAN:  Discussed the following assessment and plan:  Left shoulder pain, unspecified chronicity - Plan: DG Shoulder Left  Obesity hypoventilation syndrome (HCC)  Mobility impaired Right side pain  May be ms  and compensatory  Above seems mechanical      May benefit from pt and or other if  persistent or progressive  options discussed  Xray to review   See  above -Patient advised to return or notify health care team  if  new concerns arise. Record review   And visit and counsel  30 minutes Patient Instructions  This acts like shoulder bursitis tendinitis   Poss arthritis. avoid lifting but can do ROM activity.  Take antiinflammatory naproxyn  Twice daily  For 7- 10 days  .   X ray today.     Adhesive Capsulitis  Adhesive capsulitis, also called frozen shoulder, causes the shoulder to become stiff and painful to move. This condition happens when there is inflammation of the tendons and ligaments that surround the shoulder joint (shoulder capsule). What are the causes? This condition may be caused by:  An injury to your shoulder joint.  Straining your shoulder.  Not moving your shoulder for a period of time. This can happen if your arm was injured or in a sling.  Long-standing conditions, such as: ? Diabetes. ? Thyroid problems. ? Heart disease. ? Stroke. ? Rheumatoid arthritis. ? Lung disease. In some cases, the cause is not known. What increases the risk? You are more likely to develop this condition if you are:  A woman.  Older than 70 years of age. What are the signs or symptoms? Symptoms of this condition include:  Pain in your shoulder when you move your arm. There may also be pain when parts of your shoulder are touched. The pain may be worse at night or when you are resting.  A sore or aching shoulder.  The inability to move your shoulder normally.  Muscle spasms. How is this diagnosed? This condition is diagnosed with a physical exam and imaging tests, such as an X-ray or MRI. How is this treated? This condition may be treated with:  Treatment of the underlying cause or condition.  Medicine. Medicine may be given to relieve pain, inflammation, or muscle  spasms.  Steroid injections into the shoulder joint.  Physical therapy. This involves performing exercises to get the shoulder moving again.  Acupuncture. This is a type of treatment that involves stimulating specific points on your body by inserting thin needles through your skin.  Shoulder manipulation. This is a procedure to move the shoulder into another position. It is done after you are given a medicine to make you fall asleep (general anesthetic). The joint may also be injected with salt water at high pressure to break down scarring.  Surgery. This may be done in severe cases when other treatments have failed. Although most people recover completely from adhesive capsulitis, some may not regain full shoulder movement. Follow these instructions at home: Managing pain, stiffness, and swelling      If directed, put ice on the injured area: ? Put ice in a plastic bag. ? Place a towel between your skin and the bag. ? Leave the ice on for 20 minutes, 2-3 times per day.  If directed, apply heat to the affected area before you exercise. Use the heat source that your health care provider recommends, such as a moist heat pack or a heating pad. ? Place a towel between your skin and the heat source. ? Leave the heat on for 20-30 minutes. ? Remove the heat if your skin turns bright red. This is especially important if you are unable to feel pain, heat, or cold. You may have a greater risk of getting burned. General instructions  Take over-the-counter and prescription medicines only as told by your health care provider.  If you are being treated with physical therapy, follow  instructions from your physical therapist.  Avoid exercises that put a lot of demand on your shoulder, such as throwing. These exercises can make pain worse.  Keep all follow-up visits as told by your health care provider. This is important. Contact a health care provider if:  You develop new symptoms.  Your  symptoms get worse. Summary  Adhesive capsulitis, also called frozen shoulder, causes the shoulder to become stiff and painful to move.  You are more likely to have this condition if you are a woman and over age 12.  It is treated with physical therapy, medicines, and sometimes surgery. This information is not intended to replace advice given to you by your health care provider. Make sure you discuss any questions you have with your health care provider. Document Revised: 08/14/2017 Document Reviewed: 08/14/2017 Elsevier Patient Education  2020 [place] Kimeka Badour M.D.

## 2020-01-11 NOTE — Patient Instructions (Addendum)
This acts like shoulder bursitis tendinitis   Poss arthritis. avoid lifting but can do ROM activity.  Take antiinflammatory naproxyn  Twice daily  For 7- 10 days  .   X ray today.     Adhesive Capsulitis  Adhesive capsulitis, also called frozen shoulder, causes the shoulder to become stiff and painful to move. This condition happens when there is inflammation of the tendons and ligaments that surround the shoulder joint (shoulder capsule). What are the causes? This condition may be caused by:  An injury to your shoulder joint.  Straining your shoulder.  Not moving your shoulder for a period of time. This can happen if your arm was injured or in a sling.  Long-standing conditions, such as: ? Diabetes. ? Thyroid problems. ? Heart disease. ? Stroke. ? Rheumatoid arthritis. ? Lung disease. In some cases, the cause is not known. What increases the risk? You are more likely to develop this condition if you are:  A woman.  Older than 70 years of age. What are the signs or symptoms? Symptoms of this condition include:  Pain in your shoulder when you move your arm. There may also be pain when parts of your shoulder are touched. The pain may be worse at night or when you are resting.  A sore or aching shoulder.  The inability to move your shoulder normally.  Muscle spasms. How is this diagnosed? This condition is diagnosed with a physical exam and imaging tests, such as an X-ray or MRI. How is this treated? This condition may be treated with:  Treatment of the underlying cause or condition.  Medicine. Medicine may be given to relieve pain, inflammation, or muscle spasms.  Steroid injections into the shoulder joint.  Physical therapy. This involves performing exercises to get the shoulder moving again.  Acupuncture. This is a type of treatment that involves stimulating specific points on your body by inserting thin needles through your skin.  Shoulder manipulation.  This is a procedure to move the shoulder into another position. It is done after you are given a medicine to make you fall asleep (general anesthetic). The joint may also be injected with salt water at high pressure to break down scarring.  Surgery. This may be done in severe cases when other treatments have failed. Although most people recover completely from adhesive capsulitis, some may not regain full shoulder movement. Follow these instructions at home: Managing pain, stiffness, and swelling      If directed, put ice on the injured area: ? Put ice in a plastic bag. ? Place a towel between your skin and the bag. ? Leave the ice on for 20 minutes, 2-3 times per day.  If directed, apply heat to the affected area before you exercise. Use the heat source that your health care provider recommends, such as a moist heat pack or a heating pad. ? Place a towel between your skin and the heat source. ? Leave the heat on for 20-30 minutes. ? Remove the heat if your skin turns bright red. This is especially important if you are unable to feel pain, heat, or cold. You may have a greater risk of getting burned. General instructions  Take over-the-counter and prescription medicines only as told by your health care provider.  If you are being treated with physical therapy, follow instructions from your physical therapist.  Avoid exercises that put a lot of demand on your shoulder, such as throwing. These exercises can make pain worse.  Keep all follow-up  visits as told by your health care provider. This is important. Contact a health care provider if:  You develop new symptoms.  Your symptoms get worse. Summary  Adhesive capsulitis, also called frozen shoulder, causes the shoulder to become stiff and painful to move.  You are more likely to have this condition if you are a woman and over age 64.  It is treated with physical therapy, medicines, and sometimes surgery. This information is not  intended to replace advice given to you by your health care provider. Make sure you discuss any questions you have with your health care provider. Document Revised: 08/14/2017 Document Reviewed: 08/14/2017 Elsevier Patient Education  [place].

## 2020-01-13 NOTE — Progress Notes (Signed)
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

## 2020-01-30 ENCOUNTER — Other Ambulatory Visit: Payer: Self-pay | Admitting: Adult Health

## 2020-02-06 DIAGNOSIS — J9601 Acute respiratory failure with hypoxia: Secondary | ICD-10-CM | POA: Diagnosis not present

## 2020-03-07 DIAGNOSIS — J9601 Acute respiratory failure with hypoxia: Secondary | ICD-10-CM | POA: Diagnosis not present

## 2020-03-21 ENCOUNTER — Other Ambulatory Visit: Payer: Self-pay | Admitting: Adult Health

## 2020-04-07 DIAGNOSIS — J9601 Acute respiratory failure with hypoxia: Secondary | ICD-10-CM | POA: Diagnosis not present

## 2020-04-26 ENCOUNTER — Telehealth: Payer: Self-pay | Admitting: Adult Health

## 2020-04-26 NOTE — Progress Notes (Signed)
  Chronic Care Management   Note  04/26/2020 Name: Juni Glaab MRN: [id number] DOB: June 29, 1950  Dellamae Rosamilia is a 70 y.o. year old female who is a primary care patient of [name], NP. I reached out to [name] by phone today in response to a referral sent by Ms. Senaida Lange Goeden's PCP, [name], NP.   Ms. Troiani was given information about Chronic Care Management services today including:  1. CCM service includes personalized support from designated clinical staff supervised by her physician, including individualized plan of care and coordination with other care providers 2. 24/7 contact phone numbers for assistance for urgent and routine care needs. 3. Service will only be billed when office clinical staff spend 20 minutes or more in a month to coordinate care. 4. Only one practitioner may furnish and bill the service in a calendar month. 5. The patient may stop CCM services at any time (effective at the end of the month) by phone call to the office staff.   Patient agreed to services and verbal consent obtained.   Follow up plan:   Carley Perdue [employer] Scheduler

## 2020-05-01 ENCOUNTER — Telehealth: Payer: Self-pay | Admitting: Adult Health

## 2020-05-01 NOTE — Telephone Encounter (Signed)
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  She also has a Grade 1 murmur and irregular heart rhythm  The patient didn't know that she had a heart murmur before and she's Asymptomatic.  She was suggestion for the patient to have her heart checked next time she has an office visit.  Michel Harrow [phone]

## 2020-05-08 DIAGNOSIS — J9601 Acute respiratory failure with hypoxia: Secondary | ICD-10-CM | POA: Diagnosis not present

## 2020-06-05 DIAGNOSIS — J9601 Acute respiratory failure with hypoxia: Secondary | ICD-10-CM | POA: Diagnosis not present

## 2020-06-11 ENCOUNTER — Telehealth: Payer: Self-pay | Admitting: Pharmacist

## 2020-06-11 NOTE — Chronic Care Management (AMB) (Signed)
    Chronic Care Management Pharmacy Assistant   Name: Laurie Dominguez  MRN: [id number] DOB: 07-02-1950  Reason for Encounter: Initial Questions for Pharmacist visit on 06-12-2020  Patient Questions:   1. Have you seen any other providers since your last visit? No 2. Any changes in your medications or health? No 3. Any side effects from any medications? No 4. Do you have an symptoms or problems not managed by your medications? No 5. Any concerns about your health right now? No 6. Has your provider asked that you check blood pressure, blood sugar, or follow special diet at home? No 7. Do you get any type of exercise on a regular basis? No 8. Can you think of a goal you would like to reach for your health?  a. Weight loss b. Excise more often  9. Do you have any problems getting your medications? No 10. Is there anything that you would like to discuss during the appointment? No  The patient was asked to please bring medications, blood pressure/ blood sugar log, and supplements to her appointment.       Medications: Outpatient Encounter Medications as of 06/11/2020  Medication Sig  . albuterol (PROAIR HFA) 108 (90 Base) MCG/ACT inhaler Inhale 2 puffs into the lungs every 6 (six) hours as needed for wheezing or shortness of breath  . amLODipine (NORVASC) 5 MG tablet TAKE 1 TABLET BY MOUTH  DAILY  . budesonide-formoterol (SYMBICORT) 160-4.5 MCG/ACT inhaler Inhale 2 puffs into the lungs 2 (two) times daily.  . diazepam (VALIUM) 2 MG tablet Take 1 tablet (2 mg total) by mouth at bedtime as needed for anxiety or muscle spasms.  . fluticasone (FLONASE) 50 MCG/ACT nasal spray Place 2 sprays into both nostrils daily.  . furosemide (LASIX) 20 MG tablet TAKE 1 TABLET BY MOUTH  DAILY  . hydrocortisone 2.5 % ointment Apply topically 2 (two) times daily as needed.  Marland Kitchen losartan-hydrochlorothiazide (HYZAAR) 100-25 MG tablet Take 1 tablet by mouth once daily  . meclizine (ANTIVERT) 25 MG tablet Take 1  tablet (25 mg total) by mouth daily as needed.  . naproxen (NAPROSYN) 500 MG tablet TAKE 1 TABLET BY MOUTH TWO  TIMES DAILY WITH MEALS  . omeprazole (PRILOSEC) 20 MG capsule TAKE 1 CAPSULE BY MOUTH  DAILY  . PARoxetine (PAXIL) 30 MG tablet TAKE 2 TABLETS BY MOUTH IN  THE MORNING  . rosuvastatin (CRESTOR) 5 MG tablet Take 1 tablet (5 mg total) by mouth every other day.  . traMADol (ULTRAM) 50 MG tablet Take 1 tablet (50 mg total) by mouth every 6 (six) hours as needed for moderate pain or severe pain.   No facility-administered encounter medications on file as of 06/11/2020.    Star Rating Drugs:  Dispensed Quantity Pharmacy  Losartan/HCTZ 100-25 mg 12.29.2021 [facility]  Rosuvastatin 5 gm 02.04.2022 [facility]    Amilia Revonda Standard, [place] Pharmacy Assistant [phone]

## 2020-06-12 ENCOUNTER — Other Ambulatory Visit: Payer: Self-pay

## 2020-06-12 ENCOUNTER — Other Ambulatory Visit: Payer: Self-pay | Admitting: Adult Health

## 2020-06-12 ENCOUNTER — Ambulatory Visit (INDEPENDENT_AMBULATORY_CARE_PROVIDER_SITE_OTHER): Payer: Medicare Other | Admitting: Pharmacist

## 2020-06-12 DIAGNOSIS — J449 Chronic obstructive pulmonary disease, unspecified: Secondary | ICD-10-CM | POA: Diagnosis not present

## 2020-06-12 DIAGNOSIS — E1169 Type 2 diabetes mellitus with other specified complication: Secondary | ICD-10-CM

## 2020-06-12 DIAGNOSIS — E785 Hyperlipidemia, unspecified: Secondary | ICD-10-CM

## 2020-06-12 DIAGNOSIS — I10 Essential (primary) hypertension: Secondary | ICD-10-CM

## 2020-06-12 NOTE — Progress Notes (Signed)
Chronic Care Management Pharmacy Note  06/21/2020 Name:  Laurie Dominguez MRN:  [id number] DOB:  09-Sep-1950  Subjective: Laurie Dominguez is an 70 y.o. year old female who is a primary patient of [name], NP.  The CCM team was consulted for assistance with disease management and care coordination needs.    Engaged with patient face to face for initial visit in response to provider referral for pharmacy case management and/or care coordination services.   Consent to Services:  The patient was given the following information about Chronic Care Management services today, agreed to services, and gave verbal consent: 1. CCM service includes personalized support from designated clinical staff supervised by the primary care provider, including individualized plan of care and coordination with other care providers 2. 24/7 contact phone numbers for assistance for urgent and routine care needs. 3. Service will only be billed when office clinical staff spend 20 minutes or more in a month to coordinate care. 4. Only one practitioner may furnish and bill the service in a calendar month. 5.The patient may stop CCM services at any time (effective at the end of the month) by phone call to the office staff. 6. The patient will be responsible for cost sharing (co-pay) of up to 20% of the service fee (after annual deductible is met). Patient agreed to services and consent obtained.  Patient Care Team: [name], NP as PCP - General (Family Medicine) Griselda Miner, MD as Consulting Physician (Dermatology) Viona Gilmore, [facility] as Pharmacist (Pharmacist)  Recent office visits: 01/11/20 Shanon Ace, MD: Patient presented for left shoulder pain. Recommended naproxen twice daily for 7-10 days.  11/15/19 Ofilia Neas, LPN: Patient presented for AWV.  8//21 [name], NP: Patient presented for annual exam. Prescribed Crestor 5 mg every other day.  Recent consult visits: 12/07/19 Eric Form, NP (pulmonary): Patient presented for lung cancer screening program based on history of smoking. Chest X-ray ordered. 11/03/19 Rolm Bookbinder (dermatology): Unable to access notes.  Hospital visits: None in previous 6 months  Objective:  Lab Results  Component Value Date   CREATININE 0.74 11/08/2019   BUN 21 11/08/2019   GFR 90.27 10/26/2018   GFRNONAA 82 11/08/2019   GFRAA 95 11/08/2019   NA 146 11/08/2019   K 3.7 11/08/2019   CALCIUM 9.4 11/08/2019   CO2 40 (H) 11/08/2019   GLUCOSE 108 (H) 11/08/2019    Lab Results  Component Value Date/Time   HGBA1C 5.8 (H) 11/08/2019 10:09 AM   HGBA1C 5.8 10/26/2018 10:42 AM   GFR 90.27 10/26/2018 10:42 AM   GFR 96.25 09/28/2017 10:09 AM    Last diabetic Eye exam: No results found for: HMDIABEYEEXA  Last diabetic Foot exam: No results found for: HMDIABFOOTEX   Lab Results  Component Value Date   CHOL 210 (H) 11/08/2019   HDL 41 (L) 11/08/2019   LDLCALC 143 (H) 11/08/2019   LDLDIRECT 150.5 06/20/2008   TRIG 137 11/08/2019   CHOLHDL 5.1 (H) 11/08/2019    Hepatic Function Latest Ref Rng & Units 11/08/2019 10/26/2018 09/28/2017  Total Protein 6.1 - 8.1 g/dL 6.5 6.9 6.8  Albumin 3.5 - 5.2 g/dL - 4.1 3.9  AST 10 - 35 U/L _0 ALT 6 - 29 U/L _1 Alk Phosphatase 39 - 117 U/L - 96 100  Total Bilirubin 0.2 - 1.2 mg/dL 0.5 0.4 0.4  Bilirubin, Direct 0.0 - 0.3 mg/dL - - -    Lab Results  Component Value  Date/Time   TSH 3.11 11/08/2019 10:09 AM   TSH 2.05 10/26/2018 10:42 AM    CBC Latest Ref Rng & Units 11/08/2019 10/26/2018 09/28/2017  WBC 3.8 - 10.8 Thousand/uL 7.2 7.7 6.0  Hemoglobin 11.7 - 15.5 g/dL 13.1 13.4 14.4  Hematocrit 35.0 - 45.0 % 41.2 40.7 43.4  Platelets 140 - 400 Thousand/uL 314 333.0 298.0    No results found for: VD25OH  Clinical ASCVD: No  The 10-year ASCVD risk score Mikey Bussing DC Jr., et al., 2013) is: 27.3%   Values used to calculate the score:     Age: 72 years     Sex: Female     Is Non-Hispanic  African American: No     Diabetic: Yes     Tobacco smoker: No     Systolic Blood Pressure: 121 mmHg     Is BP treated: Yes     HDL Cholesterol: 41 mg/dL     Total Cholesterol: 210 mg/dL    Depression screen [facility] 2/9 11/15/2019 11/08/2019 11/08/2019  Decreased Interest 0 0 0  Down, Depressed, Hopeless 0 0 0  PHQ - 2 Score 0 0 0  Altered sleeping 0 - -  Tired, decreased energy 0 - -  Change in appetite 0 - -  Feeling bad or failure about yourself  0 - -  Trouble concentrating 0 - -  Moving slowly or fidgety/restless 0 - -  Suicidal thoughts 0 - -  PHQ-9 Score 0 - -  Difficult doing work/chores Not difficult at all - -     No flowsheet data found.   No flowsheet data found.   Social History   Tobacco Use  Smoking Status Former Smoker  . Packs/day: 2.00  . Years: 40.00  . Pack years: 80.00  . Types: Cigarettes  . Quit date: 03/24/2006  . Years since quitting: 14.2  Smokeless Tobacco Never Used   BP Readings from Last 3 Encounters:  01/11/20 130/70  12/07/19 120/70  11/08/19 118/62   Pulse Readings from Last 3 Encounters:  01/11/20 85  12/07/19 95  09/20/18 95   Wt Readings from Last 3 Encounters:  01/11/20 281 lb 3.2 oz (127.6 kg)  12/07/19 280 lb 6.4 oz (127.2 kg)  11/08/19 277 lb (125.6 kg)   BMI Readings from Last 3 Encounters:  01/11/20 48.27 kg/m  12/07/19 48.13 kg/m  11/08/19 50.66 kg/m    Assessment/Interventions: Review of patient past medical history, allergies, medications, health status, including review of consultants reports, laboratory and other test data, was performed as part of comprehensive evaluation and provision of chronic care management services.   SDOH:  (Social Determinants of Health) assessments and interventions performed: Yes SDOH Interventions   Flowsheet Row Most Recent Value  SDOH Interventions   Financial Strain Interventions Intervention Not Indicated  Transportation Interventions Intervention Not Indicated        Patient and her husband were present for the visit. They spend their mornings watching the news and then he reads the newspaper and she checks neighborhood emails. Sometimes they eat breakfast and sometimes it's brunch. He works outside or in the house cleaning and vacuuming or mowing and blowing leaves. They own a pool which he does maintenance on but she enjoys swimming in it during the summer. They don't go out of the house too often due to [place].   They only have one cousin in town that they see and not [place] friends.  They have an 8 year old dog who is part yorkie  and schnauzer. She is in a wheelchair.  Patient's husband does the cooking and for meat they mostly have chicken (baked, sauteed or chicken salad) and some beef. She eats fish and shrimp and very little fried fish. They don't get takeout too often. They also make tacos and nachos, salads 2-3 times a week, and eats spinach, green beans, corn, and not too many sandwiches. They have cut way back on desserts and enjoy fruit such a oranges, grapes, and bananas.  She doesn't exercise except in the summer when she is able to swim. She is also uncomfortable traveling and they don't go to [place] too often for that reason to see the boys.  Patient sleeps with oxygen and she could not find a mask that would stay with her and she turns a lot. Patient plans to follow up with Dr. Annamaria Boots for other options. She does feel rested during the day but has nightmares sometimes at night.   Patient denies any problems with her current medications and is not having muscle pain with rosuvastatin.   CCM Care Plan  Allergies  Allergen Reactions  . Codeine Phosphate   . Simvastatin     Myalgia     Medications Reviewed Today    Reviewed by Viona Gilmore, [facility] (Pharmacist) on 06/21/20 at [place] List Status: <None>  Medication Order Taking? Sig Documenting Provider Last Dose Status Informant  albuterol (PROAIR HFA) 108 (90 Base) MCG/ACT inhaler  [id number] Yes Inhale 2 puffs into the lungs every 6 (six) hours as needed for wheezing or shortness of breath Nafziger, Tommi Rumps, NP Taking Active   amLODipine (NORVASC) 5 MG tablet [id number] Yes TAKE 1 TABLET BY MOUTH  DAILY Nafziger, Tommi Rumps, NP Taking Active   budesonide-formoterol [facility]) 160-4.5 MCG/ACT inhaler [id number] Yes Inhale 2 puffs into the lungs 2 (two) times daily. Magdalen Spatz, NP Taking Active   diazepam (VALIUM) 2 MG tablet [id number] Yes Take 1 tablet (2 mg total) by mouth at bedtime as needed for anxiety or muscle spasms. Nafziger, Tommi Rumps, NP Taking Active   fluticasone (FLONASE) 50 MCG/ACT nasal spray [id number] Yes Place 2 sprays into both nostrils daily. Laqueta Linden, MD Taking Expired 11/15/19 2359   furosemide (LASIX) 20 MG tablet [id number] Yes TAKE 1 TABLET BY MOUTH  DAILY Nafziger, Tommi Rumps, NP Taking Active   hydrocortisone 2.5 % ointment [id number] Yes Apply topically 2 (two) times daily as needed. Nafziger, Tommi Rumps, NP Taking Active   losartan-hydrochlorothiazide (HYZAAR) 100-25 MG tablet [id number] Yes Take 1 tablet by mouth once daily Nafziger, Tommi Rumps, NP Taking Active   meclizine (ANTIVERT) 25 MG tablet [id number] No Take 1 tablet (25 mg total) by mouth daily as needed.  Patient not taking: Reported on 06/12/2020   Marletta Lor, MD Not Taking Active Self  naproxen (NAPROSYN) 500 MG tablet [id number] Yes TAKE 1 TABLET BY MOUTH TWO  TIMES DAILY WITH MEALS Marletta Lor, MD Taking Active   omeprazole (PRILOSEC) 20 MG capsule [id number] Yes TAKE 1 CAPSULE BY MOUTH  DAILY Nafziger, Tommi Rumps, NP Taking Active   PARoxetine (PAXIL) 30 MG tablet [id number] Yes TAKE 2 TABLETS BY MOUTH IN  THE MORNING Nafziger, Tommi Rumps, NP Taking Active   rosuvastatin (CRESTOR) 5 MG tablet [id number] Yes Take 1 tablet (5 mg total) by mouth every other day. Nafziger, Tommi Rumps, NP Taking Active   traMADol (ULTRAM) 50 MG tablet [id number] Yes Take 1 tablet (50 mg total) by mouth every 6 (six) hours as needed  for moderate pain or  severe pain. Nafziger, Tommi Rumps, NP Taking Active   triamcinolone (KENALOG) 0.025 % cream [id number] Yes Apply 1 application topically 2 (two) times daily. [provider] Taking Active           Patient Active Problem List   Diagnosis Date Noted  . 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 Chronic respiratory failure with hypoxia ([place]) 02/04/2017  . Psoriasis 09/26/2016  . Obstructive sleep apnea 06/12/2016  . COPD mixed type ([place]) 06/12/2016  . Obesity hypoventilation syndrome ([place]) 04/24/2016  . Hypoxia 04/05/2016  . Acute respiratory failure with hypoxia ([place]) 04/05/2016  . Impaired glucose tolerance 04/11/2014  . Morbid obesity ([place]) 04/11/2014  . Depression, recurrent ([place]) 12/21/2006  . Essential hypertension 12/21/2006  . GERD 12/21/2006  . NEPHROLITHIASIS, HX OF 12/21/2006    Immunization History  Administered Date(s) Administered  . Influenza Split 01/14/2012, 02/02/2013, 01/19/2017  . Influenza Whole 01/05/1996  . Influenza, High Dose Seasonal PF 02/12/2015, 12/06/2015, 12/29/2017, 12/22/2018, 12/22/2019  . Influenza,inj,Quad PF,6+ Mos 01/12/2014  . PFIZER(Purple Top)SARS-COV-2 Vaccination 05/16/2019, 06/06/2019, 01/05/2020  . Pneumococcal Conjugate-13 09/26/2015, 12/22/2016  . Pneumococcal Polysaccharide-23 09/28/2017, 12/22/2018  . Tdap 10/09/2010  . Zoster Recombinat (Shingrix) 09/28/2017, 12/29/2017    Conditions to be addressed/monitored:  Hypertension, Hyperlipidemia, GERD, COPD, Depression and prediabetes, psoriasis and pain  Care Plan : [place]  Updates made by Viona Gilmore, [place] since 06/21/2020 12:00 AM    Problem: Problem: Hypertension, Hyperlipidemia, GERD, COPD, Depression and prediabetes, psoriasis and pain     Long-Range Goal: Patient-Specific Goal   Start Date: 06/12/2020  Expected End Date: 06/12/2021  This Visit's Progress: On track  Priority: High  Note:    Current Barriers:  . Unable to independently monitor therapeutic efficacy . Suboptimal therapeutic regimen for swelling and hypertension . Unable to self administer medications as prescribed  Pharmacist Clinical Goal(s):  Marland Kitchen Patient will achieve adherence to monitoring guidelines and medication adherence to achieve therapeutic efficacy . adhere to plan to optimize therapeutic regimen for Symbicort as evidenced by report of adherence to recommended medication management changes through collaboration with PharmD and provider.   Interventions: . 1:1 collaboration with [name], NP regarding development and update of comprehensive plan of care as evidenced by provider attestation and co-signature . Inter-disciplinary care team collaboration (see longitudinal plan of care) . Comprehensive medication review performed; medication list updated in electronic medical record  Hypertension (BP goal <140/90) -Controlled -Current treatment: . Amlodipine 5 mg 1 tablet daily . Losartan-HCTZ 100-25 1 tablet daily  -Medications previously tried: none  -Current home readings: 125/66, 130/75 (checking M, W and F) -Current dietary habits: does not exercise -Current exercise habits: husband limits her salt intake; recommended trying lite salt with lower sodium -Reports hypotensive/hypertensive symptoms -Educated on Exercise goal of 150 minutes per week; Importance of home blood pressure monitoring; Symptoms of hypotension and importance of maintaining adequate hydration; -Counseled to monitor BP at home at least weekly, document, and provide log at future appointments -Counseled on diet and exercise extensively Recommended to continue current medication  Hyperlipidemia: (LDL goal < 100) -Uncontrolled -Current treatment: . Rosuvastatin 5 mg every other day -Medications previously tried: simvastatin (side effects)  -Current dietary patterns: not frying foods often; enjoys vegetables -Current  exercise habits: not exercising -Educated on Cholesterol goals;  Importance of limiting foods high in cholesterol; Exercise goal of 150 minutes per week; -Counseled on diet and exercise extensively Recommended to continue current medication  Pre-diabetes (A1c  goal <6.5%) -Controlled -Current medications: . No medications -Medications previously tried: none  -Current home glucose readings . fasting glucose: n/a . post prandial glucose: n/a -Denies hypoglycemic/hyperglycemic symptoms -Current meal patterns:  . breakfast: n/a  . lunch: n/a  . dinner: n/a . snacks: n/a . drinks: n/a -Current exercise: does not exercise -Educated on A1c and blood sugar goals; Exercise goal of 150 minutes per week; Carbohydrate counting and/or plate method -Counseled to check feet daily and get yearly eye exams -Counseled on diet and exercise extensively  COPD (Goal: control symptoms and prevent exacerbations) -Not ideally controlled -Current treatment  . Symbicort 160-4.5 mcg/act 2 puffs twice daily . Albuterol HFA 2 puffs every 6 hours as needed -Medications previously tried: none  -Gold Grade: Gold 2 (FEV1 50-79%) -Current COPD Classification:  B (high sx, <2 exacerbations/yr) -MMRC/CAT score: n/a -Pulmonary function testing: n/a -Exacerbations requiring treatment in last 6 months: none -Patient denies consistent use of maintenance inhaler -Frequency of rescue inhaler use: as needed -Counseled on Proper inhaler technique; Benefits of consistent maintenance inhaler use Differences between maintenance and rescue inhalers -Recommended to continue current medication Recommended for patient to start taking Symbicort twice daily scheduled instead of as needed  Depression/Anxiety (Goal: minimize symptoms and prevent crying) -Controlled -Current treatment: . Paroxetine 30 mg 2 tablets daily in the morning . Diazepam 2 mg 1 tablet at bedtime as needed - not taking often -Medications  previously tried/failed: none -PHQ9: 0 -GAD7: n/a -Educated on Benefits of medication for symptom control Benefits of cognitive-behavioral therapy with or without medication -Recommended to continue current medication  GERD (Goal: minimize symptoms of acid reflux) -Not ideally controlled -Current treatment  . Omeprazole 20 mg 1 capsule daily -Medications previously tried: none -Counseled on non-pharmacologic management of symptoms such as elevating the head of your bed, avoiding eating 2-3 hours before bed, avoiding triggering foods such as acidic, spicy, or fatty foods, eating smaller meals, and wearing clothes that are loose around the waist Recommended trying every other day to see if she truly needs it Patient was smoking and when she quit heartburn occurred less often  Edema (Goal: minimize ankle swelling) -Controlled -Current treatment  . Furosemide 20 mg 1 tablet daily -Medications previously tried: none  -Educated on limiting salt intake and possibly off balancing the amlodipine side effect Collaborated with PCP about alternative options  Allergic rhinitis (Goal: minimize symptoms) -Controlled -Current treatment  . Flonase 50 mcg/act 2 sprays in both nostrils daily as needed . Mucinex 12 hour 1 tablet at bedtime  -Medications previously tried: none  -Recommended to continue current medication  Pain (Goal: minimize symptoms) -Controlled -Current treatment  . Tramadol 50 mg 1 tablet every 6 hours as needed . Naproxen 500 mg 1 tablet twice daily with meals as needed -Medications previously tried: n/a  -Counseled on taking a stool softener with tramadol as this can cause constipation  Health Maintenance -Vaccine gaps: none -Current therapy:  . None -Educated on Cost vs benefit of each product must be carefully weighed by individual consumer -Patient is satisfied with current therapy and denies issues -Counseled on getting recommended amounts of calcium and vitamin D  daily  Patient Goals/Self-Care Activities . Patient will:  - take medications as prescribed check blood pressure weekly, document, and provide at future appointments target a minimum of 150 minutes of moderate intensity exercise weekly engage in dietary modifications by increasing fiber intake  Follow Up Plan: Telephone follow up appointment with care management team member scheduled for: 3 months  Medication Assistance: None required.  Patient affirms current coverage meets needs.  Patient's preferred pharmacy is:  [place], [place] - [address] [place] [address] Phone: [phone] Fax: [phone]  [place], [address], [address] [place], [place] [address] Phone: [phone] Fax: [phone]  [id number] [place], [place] [facility] [address] AT [place] [address] [place] [address] Phone: [phone] Fax: [phone]  Uses pill box? Yes Pt endorses 90% compliance - not taking Symbicort scheduled  We discussed: Current pharmacy is preferred with insurance plan and patient is satisfied with pharmacy services Patient decided to: Continue current medication management strategy  Care Plan and Follow Up Patient Decision:  Patient agrees to Care Plan and Follow-up.  Plan: Telephone follow up appointment with care management team member scheduled for:  3 months  Jeni Salles, PharmD [place] Pharmacist [place] at [place] [phone]

## 2020-06-18 ENCOUNTER — Ambulatory Visit: Payer: Medicare Other

## 2020-06-20 ENCOUNTER — Ambulatory Visit: Payer: Medicare Other

## 2020-06-20 NOTE — Progress Notes (Incomplete)
Chronic Care Management Pharmacy Note  06/12/2020 Name:  Laurie Dominguez MRN:  [id number] DOB:  Jul 23, 1950  Subjective: Laurie Dominguez is an 70 y.o. year old female who is a primary patient of [name], NP.  The CCM team was consulted for assistance with disease management and care coordination needs.    {CCMTELEPHONEFACETOFACE:[id number]} for initial visit in response to provider referral for pharmacy case management and/or care coordination services.   Consent to Services:  The patient was given the following information about Chronic Care Management services today, agreed to services, and gave verbal consent: 1. CCM service includes personalized support from designated clinical staff supervised by the primary care provider, including individualized plan of care and coordination with other care providers 2. 24/7 contact phone numbers for assistance for urgent and routine care needs. 3. Service will only be billed when office clinical staff spend 20 minutes or more in a month to coordinate care. 4. Only one practitioner may furnish and bill the service in a calendar month. 5.The patient may stop CCM services at any time (effective at the end of the month) by phone call to the office staff. 6. The patient will be responsible for cost sharing (co-pay) of up to 20% of the service fee (after annual deductible is met). Patient agreed to services and consent obtained.  Patient Care Team: [name], NP as PCP - General (Family Medicine) Griselda Miner, MD as Consulting Physician (Dermatology) Viona Gilmore, [facility] as Pharmacist (Pharmacist)  Recent office visits: 01/11/20 Shanon Ace, MD: Patient presented for left shoulder pain. Recommended naproxen twice daily for 7-10 days.  11/15/19 Ofilia Neas, LPN: Patient presented for AWV.  8//21 [name], NP: Patient presented for annual exam. Prescribed Crestor 5 mg every other day.  Recent consult visits: 12/07/19 Eric Form, NP (pulmonary): Patient presented for lung cancer screening program based on history of smoking. Chest X-ray ordered. 11/03/19 Rolm Bookbinder (dermatology): Unable to access notes.  Hospital visits: None in previous 6 months  Objective:  Lab Results  Component Value Date   CREATININE 0.74 11/08/2019   BUN 21 11/08/2019   GFR 90.27 10/26/2018   GFRNONAA 82 11/08/2019   GFRAA 95 11/08/2019   NA 146 11/08/2019   K 3.7 11/08/2019   CALCIUM 9.4 11/08/2019   CO2 40 (H) 11/08/2019   GLUCOSE 108 (H) 11/08/2019    Lab Results  Component Value Date/Time   HGBA1C 5.8 (H) 11/08/2019 10:09 AM   HGBA1C 5.8 10/26/2018 10:42 AM   GFR 90.27 10/26/2018 10:42 AM   GFR 96.25 09/28/2017 10:09 AM    Last diabetic Eye exam: No results found for: HMDIABEYEEXA  Last diabetic Foot exam: No results found for: HMDIABFOOTEX   Lab Results  Component Value Date   CHOL 210 (H) 11/08/2019   HDL 41 (L) 11/08/2019   LDLCALC 143 (H) 11/08/2019   LDLDIRECT 150.5 06/20/2008   TRIG 137 11/08/2019   CHOLHDL 5.1 (H) 11/08/2019    Hepatic Function Latest Ref Rng & Units 11/08/2019 10/26/2018 09/28/2017  Total Protein 6.1 - 8.1 g/dL 6.5 6.9 6.8  Albumin 3.5 - 5.2 g/dL - 4.1 3.9  AST 10 - 35 U/L 12 12 10   ALT 6 - 29 U/L 8 11 10   Alk Phosphatase 39 - 117 U/L - 96 100  Total Bilirubin 0.2 - 1.2 mg/dL 0.5 0.4 0.4  Bilirubin, Direct 0.0 - 0.3 mg/dL - - -    Lab Results  Component Value Date/Time   TSH 3.11  11/08/2019 10:09 AM   TSH 2.05 10/26/2018 10:42 AM    CBC Latest Ref Rng & Units 11/08/2019 10/26/2018 09/28/2017  WBC 3.8 - 10.8 Thousand/uL 7.2 7.7 6.0  Hemoglobin 11.7 - 15.5 g/dL 13.1 13.4 14.4  Hematocrit 35.0 - 45.0 % 41.2 40.7 43.4  Platelets 140 - 400 Thousand/uL 314 333.0 298.0    No results found for: VD25OH  Clinical ASCVD: No  The 10-year ASCVD risk score Mikey Bussing DC Jr., et al., 2013) is: 27.3%   Values used to calculate the score:     Age: 78 years     Sex: Female     Is Non-Hispanic  African American: No     Diabetic: Yes     Tobacco smoker: No     Systolic Blood Pressure: 497 mmHg     Is BP treated: Yes     HDL Cholesterol: 41 mg/dL     Total Cholesterol: 210 mg/dL    Depression screen [facility] 2/9 11/15/2019 11/08/2019 11/08/2019  Decreased Interest 0 0 0  Down, Depressed, Hopeless 0 0 0  PHQ - 2 Score 0 0 0  Altered sleeping 0 - -  Tired, decreased energy 0 - -  Change in appetite 0 - -  Feeling bad or failure about yourself  0 - -  Trouble concentrating 0 - -  Moving slowly or fidgety/restless 0 - -  Suicidal thoughts 0 - -  PHQ-9 Score 0 - -  Difficult doing work/chores Not difficult at all - -     ***Other: (CHADS2VASc if Afib, MMRC or CAT for COPD, ACT, DEXA)  Social History   Tobacco Use  Smoking Status Former Smoker  . Packs/day: 2.00  . Years: 40.00  . Pack years: 80.00  . Types: Cigarettes  . Quit date: 03/24/2006  . Years since quitting: 14.2  Smokeless Tobacco Never Used   BP Readings from Last 3 Encounters:  01/11/20 130/70  12/07/19 120/70  11/08/19 118/62   Pulse Readings from Last 3 Encounters:  01/11/20 85  12/07/19 95  09/20/18 95   Wt Readings from Last 3 Encounters:  01/11/20 281 lb 3.2 oz (127.6 kg)  12/07/19 280 lb 6.4 oz (127.2 kg)  11/08/19 277 lb (125.6 kg)   BMI Readings from Last 3 Encounters:  01/11/20 48.27 kg/m  12/07/19 48.13 kg/m  11/08/19 50.66 kg/m    Assessment/Interventions: Review of patient past medical history, allergies, medications, health status, including review of consultants reports, laboratory and other test data, was performed as part of comprehensive evaluation and provision of chronic care management services.   SDOH:  (Social Determinants of Health) assessments and interventions performed: {yes/no:20286}    Patient and her husband were present for the visit. They spend their mornings watching the news and then he reads the newspaper and she checks neighborhood emails. Sometimes they eat  breakfast and sometimes it's brunch. He works outside or in the house cleaning and vacuuming or mowing and blowing leaves. They own a pool which he does maintenance on but she enjoys swimming in it during the summer. They don't go out of the house too often due to [place].   They only have one cousin in town that they see and not [place] friends.  They have an 56 year old dog who is part yorkie and Arts administrator. She is in a wheelchair.  Patient's husband does the cooking and for meat they mostly have chicken (baked, sauteed or chicken salad) and some beef. She eats fish and shrimp and  very little fried fish. They don't get takeout too often. They also make tacos and nachos, salads 2-3 times a week, and eats spinach, green beans, corn, and not too many sandwiches. They have cut way back on desserts and enjoy fruit such a oranges, grapes, and bananas.  She doesn't exercise except in the summer when she is able to swim. She is also uncomfortable traveling and they don't go to [place] too often for that reason to see the boys.  Patient sleeps with oxygen and she could not find a mask that would stay with her and she turns a lot. Patient plans to follow up with Dr. Annamaria Boots for other options. She does feel rested during the day but has nightmares sometimes at night.   Patient denies any problems with her current medications and is not having muscle pain with rosuvastatin.   CCM Care Plan  Allergies  Allergen Reactions  . Codeine Phosphate   . Simvastatin     Myalgia     Medications Reviewed Today    Reviewed by Jannette Spanner, CMA (Certified Medical Assistant) on 01/11/20 at 1418  Med List Status: <None>  Medication Order Taking? Sig Documenting Provider Last Dose Status Informant  albuterol (PROAIR HFA) 108 (90 Base) MCG/ACT inhaler [id number] Yes Inhale 2 puffs into the lungs every 6 (six) hours as needed for wheezing or shortness of breath Nafziger, Tommi Rumps, NP Taking Active   amLODipine (NORVASC) 5 MG  tablet [id number] Yes TAKE 1 TABLET BY MOUTH  DAILY Nafziger, Tommi Rumps, NP Taking Active   budesonide-formoterol [facility]) 160-4.5 MCG/ACT inhaler [id number] Yes Inhale 2 puffs into the lungs 2 (two) times daily. Magdalen Spatz, NP Taking Active   diazepam (VALIUM) 2 MG tablet [id number] Yes Take 1 tablet (2 mg total) by mouth at bedtime as needed for anxiety or muscle spasms. Nafziger, Tommi Rumps, NP Taking Active   fluticasone (FLONASE) 50 MCG/ACT nasal spray [id number]  Place 2 sprays into both nostrils daily. Laqueta Linden, MD  Expired 11/15/19 2359   furosemide (LASIX) 20 MG tablet [id number] Yes TAKE 1 TABLET BY MOUTH  DAILY Nafziger, Tommi Rumps, NP Taking Active   hydrocortisone 2.5 % ointment [id number] Yes Apply topically 2 (two) times daily as needed. Nafziger, Tommi Rumps, NP Taking Active   losartan-hydrochlorothiazide (HYZAAR) 100-25 MG tablet [id number] Yes TAKE 1 TABLET BY MOUTH  DAILY Nafziger, Tommi Rumps, NP Taking Active   meclizine (ANTIVERT) 25 MG tablet [id number] Yes Take 1 tablet (25 mg total) by mouth daily as needed. Marletta Lor, MD Taking Active Self  naproxen (NAPROSYN) 500 MG tablet [id number] Yes TAKE 1 TABLET BY MOUTH TWO  TIMES DAILY WITH MEALS Marletta Lor, MD Taking Active   omeprazole (PRILOSEC) 20 MG capsule [id number] Yes TAKE 1 CAPSULE BY MOUTH  DAILY Nafziger, Tommi Rumps, NP Taking Active   PARoxetine (PAXIL) 30 MG tablet [id number] Yes TAKE 2 TABLETS BY MOUTH IN  THE MORNING Nafziger, Tommi Rumps, NP Taking Active   rosuvastatin (CRESTOR) 5 MG tablet [id number] Yes Take 1 tablet (5 mg total) by mouth every other day. Nafziger, Tommi Rumps, NP Taking Active   traMADol (ULTRAM) 50 MG tablet [id number] Yes Take 1 tablet (50 mg total) by mouth every 6 (six) hours as needed for moderate pain or severe pain. [name], NP Taking Active           Patient Active Problem List   Diagnosis Date Noted  . 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 Chronic  respiratory failure with hypoxia ([place]) 02/04/2017  . Psoriasis 09/26/2016  . Obstructive sleep apnea 06/12/2016  . COPD mixed type ([place]) 06/12/2016  . Obesity hypoventilation syndrome ([place]) 04/24/2016  . Hypoxia 04/05/2016  . Acute respiratory failure with hypoxia ([place]) 04/05/2016  . Impaired glucose tolerance 04/11/2014  . Morbid obesity ([place]) 04/11/2014  . Depression, recurrent ([place]) 12/21/2006  . Essential hypertension 12/21/2006  . GERD 12/21/2006  . NEPHROLITHIASIS, HX OF 12/21/2006    Immunization History  Administered Date(s) Administered  . Influenza Split 01/14/2012, 02/02/2013, 01/19/2017  . Influenza Whole 01/05/1996  . Influenza, High Dose Seasonal PF 02/12/2015, 12/06/2015, 12/29/2017, 12/22/2018  . Influenza,inj,Quad PF,6+ Mos 01/12/2014  . PFIZER(Purple Top)SARS-COV-2 Vaccination 05/16/2019, 06/06/2019, 01/05/2020  . Pneumococcal Conjugate-13 09/26/2015, 12/22/2016  . Pneumococcal Polysaccharide-23 09/28/2017, 12/22/2018  . Tdap 10/09/2010  . Zoster Recombinat (Shingrix) 09/28/2017, 12/29/2017    Conditions to be addressed/monitored:  Hypertension, Hyperlipidemia, GERD, COPD, Depression and prediabetes, psoriasis and pain  There are no care plans that you recently modified to display for this patient.   Current Barriers:  . {pharmacybarriers:24917} . ***  Pharmacist Clinical Goal(s):  Marland Kitchen Patient will {PHARMACYGOALCHOICES:24921} through collaboration with PharmD and provider.  . ***  Interventions: . 1:1 collaboration with [name], NP regarding development and update of comprehensive plan of care as evidenced by provider attestation and co-signature . Inter-disciplinary care team collaboration (see longitudinal plan of care) . Comprehensive medication review performed; medication list updated in electronic medical record BP Readings from Last 3 Encounters:  01/11/20 130/70  12/07/19 120/70  11/08/19 118/62    Hypertension (BP goal  <140/90) -Controlled -Current treatment: . Amlodipine 5 mg 1 tablet daily . Losartan-HCTZ 100-25 1 tablet daily  -Medications previously tried: ***  -Current home readings: 125/66, 130/75 (M, W, F) - usually 120s/60s (no > 140) -Current dietary habits: *** - he limits her salt, season recommended lite salt -Current exercise habits: ***  -Denies hypotensive/hypertensive symptoms -Educated on {CCM BP Counseling:25124} -Counseled to monitor BP at home ***, document, and provide log at future appointments -{CCMPHARMDINTERVENTION:25122}  -lost 15 lbs with [employer] -dizzy every once in a while -ask Tommi Rumps about this  Lipid Panel     Component Value Date/Time   CHOL 210 (H) 11/08/2019 1009   TRIG 137 11/08/2019 1009   HDL 41 (L) 11/08/2019 1009   CHOLHDL 5.1 (H) 11/08/2019 1009   VLDL 31.8 10/26/2018 1042   LDLCALC 143 (H) 11/08/2019 1009   LDLDIRECT 150.5 06/20/2008 0815   Hyperlipidemia: (LDL goal < 100) -Uncontrolled -Current treatment: . Rosuvastatin 5 mg every other day -Medications previously tried: simvastatin (side effects)  -Current dietary patterns: *** -Current exercise habits: *** -Educated on {CCM HLD Counseling:25126} -{CCMPHARMDINTERVENTION:25122}  -she likes vegetables  Pre-diabetes (A1c goal <6.5%) -{US controlled/uncontrolled:25276} -Current medications: . No medications -Medications previously tried: none  -Current home glucose readings . fasting glucose: *** . post prandial glucose: *** -{ACTIONS;DENIES/REPORTS:21021675::"Denies"} hypoglycemic/hyperglycemic symptoms -Current meal patterns:  . breakfast: ***  . lunch: ***  . dinner: *** . snacks: *** . drinks: *** -Current exercise: *** -Educated on {CCM DM COUNSELING:25123} -Counseled to check feet daily and get yearly eye exams -{CCMPHARMDINTERVENTION:25122}  COPD (Goal: control symptoms and prevent exacerbations) -{US controlled/uncontrolled:25276} -Current treatment  . Symbicort  160-4.5 mcg/act 2 puffs twice daily . Albuterol HFA 2 puffs every 6 hours as needed -Medications previously tried: ***  -Gold Grade: {CHL HP [employer] Pharm COPD Gold TTSVX:[id number]} -Current COPD Classification:  {CHL HP Upstream Pharm COPD [phone]} -MMRC/CAT score: *** -Pulmonary  function testing: *** -Exacerbations requiring treatment in last 6 months: *** -Patient {Actions; denies-reports:120008} consistent use of maintenance inhaler -Frequency of rescue inhaler use: *** -Counseled on {CCMINHALERCOUNSELING:25121} -{CCMPHARMDINTERVENTION:25122}  -Symbicort as needed -rinsing and spitting -Mucinex 12 hr 1 tablet every night  Depression/Anxiety (Goal: ***) -{US controlled/uncontrolled:25276} -Current treatment: . Paroxetine 30 mg 2 tablets daily in the morning . Diazepam 2 mg 1 tablet at bedtime as needed - taking? Doesn't take often -Medications previously tried/failed: none -PHQ9: *** -GAD7: *** -Connected with *** for mental health support -Educated on {CCM mental health counseling:25127} -{CCMPHARMDINTERVENTION:25122}  -crying  -diazepam - not often use  GERD (Goal: ***) -{US controlled/uncontrolled:25276} -Current treatment  . Omeprazole 20 mg 1 capsule daily -Medications previously tried: none -{CCMPHARMDINTERVENTION:25122}  -none -  -was smoking -recommended every other -dinner at 6 - couple of cookies or fruit   Swelling (Goal: ***) -{US controlled/uncontrolled:25276} -Current treatment  . Furosemide 20 mg 1 tablet daily - do we need with HCTZ? -Medications previously tried: ***  -{CCMPHARMDINTERVENTION:25122}  -4 years - been on it -amlodipine has been on for longer than furosemide -tries to avoid taking it when she needs to go out  Allergic rhinits (Goal: ***) -{US controlled/uncontrolled:25276} -Current treatment  . Flonase 50 mcg/act 2 sprays in both nostrils daily as needed . Mucinex 12 hour 1 tablet at bedtime  -Medications  previously tried: ***  -{CCMPHARMDINTERVENTION:25122}  -sinus with CPAP  Psoriasis - hydrocorisone ointment?  Still taking naproxen? Last prescribed 2018   Pain (Goal: ***) -{US controlled/uncontrolled:25276} -Current treatment  . Tramadol 50 mg 1 tablet every 6 hours as needed . Naproxen 500 mg 1 tablet twice daily with meals -Medications previously tried: ***  -{CCMPHARMDINTERVENTION:25122}  -slows down system -  -alternating them  -sometimes a couple of weeks without them  -calcium: cheese a couple times a week; ice cream on Saturday night; almonds no beans  He eats pintos and navy means  1000 units, calcium citrate 600 mg   Health Maintenance -Vaccine gaps: *** influenza in fall? 12/22/19 -Current therapy:  . *** -Educated on {ccm supplement counseling:25128} -{CCM Patient satisfied:25129} -{CCMPHARMDINTERVENTION:25122}   Patient Goals/Self-Care Activities . Patient will:  - {pharmacypatientgoals:24919}  Follow Up Plan: {CM FOLLOW UP SHNG:87195}  Medication Assistance: {MEDASSISTANCEINFO:25044}  Patient's preferred pharmacy is:  [facility] [place] [address] Phone: [phone] Fax: [phone]  [facility] [address], [address] [place], [place] [address] Phone: [phone] Fax: [phone]  [id number] - [place], [address] AT [address] [place] [address] Phone: [phone] Fax: [phone]  Uses pill box? {Yes or If no, why not?:20788} Pt endorses ***% compliance  We discussed: {Pharmacy options:24294} Patient decided to: {US Pharmacy Plan:23885}  Care Plan and Follow Up Patient Decision:  {FOLLOWUP:24991}  Plan: {CM FOLLOW UP PLAN:25073}  ***

## 2020-06-21 ENCOUNTER — Other Ambulatory Visit: Payer: Self-pay | Admitting: Adult Health

## 2020-06-21 MED ORDER — ROSUVASTATIN CALCIUM 5 MG PO TABS: 5.0000 mg | ORAL_TABLET | ORAL | 1 refills | Status: DC

## 2020-06-21 NOTE — Patient Instructions (Addendum)
Hi Laurie Dominguez,  It was great to get to meet you and your husband in person! Below is a summary of some of the topics we discussed. Just a reminder to go ahead and start supplementing with calcium citrate at least 600 mg per day and vitamin D 1000 units a day to help strengthen your bones.  Also, don't forget to rinse your mouth out with water and spit with each use of Symbicort and to make sure to take it twice daily as instructed.  Please reach out to me if you have any questions or need anything before our follow up!  Best, Maddie  Jeni Salles, PharmD, [facility]  Visit Information  Goals Addressed   None    There are no care plans to display for this patient.   Laurie Dominguez was given information about Chronic Care Management services today including:  1. CCM service includes personalized support from designated clinical staff supervised by her physician, including individualized plan of care and coordination with other care providers 2. 24/7 contact phone numbers for assistance for urgent and routine care needs. 3. Standard insurance, coinsurance, copays and deductibles apply for chronic care management only during months in which we provide at least 20 minutes of these services. Most insurances cover these services at 100%, however patients may be responsible for any copay, coinsurance and/or deductible if applicable. This service may help you avoid the need for more expensive face-to-face services. 4. Only one practitioner may furnish and bill the service in a calendar month. 5. The patient may stop CCM services at any time (effective at the end of the month) by phone call to the office staff.  Patient agreed to services and verbal consent obtained.   The patient verbalized understanding of instructions, educational materials, and care plan provided today and declined offer to receive copy of patient instructions,  educational materials, and care plan.  Telephone follow up appointment with pharmacy team member scheduled for:  Viona Gilmore, [facility]  [email or web address]">  DASH Eating Plan DASH stands for Dietary Approaches to Stop Hypertension. The DASH eating plan is a healthy eating plan that has been shown to:  Reduce high blood pressure (hypertension).  Reduce your risk for type 2 diabetes, heart disease, and stroke.  Help with weight loss. What are tips for following this plan? Reading food labels  Check food labels for the amount of salt (sodium) per serving. Choose foods with less than 5 percent of the Daily Value of sodium. Generally, foods with less than 300 milligrams (mg) of sodium per serving fit into this eating plan.  To find whole grains, look for the word "whole" as the first word in the ingredient list. Shopping  Buy products labeled as "low-sodium" or "no salt added."  Buy fresh foods. Avoid canned foods and pre-made or frozen meals. Cooking  Avoid adding salt when cooking. Use salt-free seasonings or herbs instead of table salt or sea salt. Check with your health care provider or pharmacist before using salt substitutes.  Do not fry foods. Cook foods using healthy methods such as baking, boiling, grilling, roasting, and broiling instead.  Cook with heart-healthy oils, such as olive, canola, avocado, soybean, or sunflower oil. Meal planning  Eat a balanced diet that includes: ? 4 or more servings of fruits and 4 or more servings of vegetables each day. Try to fill one-half of your plate with fruits and vegetables. ? 6-8 servings of whole grains each day. ? Less  than 6 oz (170 g) of lean meat, poultry, or fish each day. A 3-oz (85-g) serving of meat is about the same size as a deck of cards. One egg equals 1 oz (28 g). ? 2-3 servings of low-fat dairy each day. One serving is 1 cup (237 mL). ? 1 serving of nuts, seeds, or beans 5  times each week. ? 2-3 servings of heart-healthy fats. Healthy fats called omega-3 fatty acids are found in foods such as walnuts, flaxseeds, fortified milks, and eggs. These fats are also found in cold-water fish, such as sardines, salmon, and mackerel.  Limit how much you eat of: ? Canned or prepackaged foods. ? Food that is high in trans fat, such as some fried foods. ? Food that is high in saturated fat, such as fatty meat. ? Desserts and other sweets, sugary drinks, and other foods with added sugar. ? Full-fat dairy products.  Do not salt foods before eating.  Do not eat more than 4 egg yolks a week.  Try to eat at least 2 vegetarian meals a week.  Eat more home-cooked food and less restaurant, buffet, and fast food.   Lifestyle  When eating at a restaurant, ask that your food be prepared with less salt or no salt, if possible.  If you drink alcohol: ? Limit how much you use to:  0-1 drink a day for women who are not pregnant.  0-2 drinks a day for men. ? Be aware of how much alcohol is in your drink. In the [place] one drink equals one 12 oz bottle of beer (355 mL), one 5 oz glass of wine (148 mL), or one 1 oz glass of hard liquor (44 mL). General information  Avoid eating more than 2,300 mg of salt a day. If you have hypertension, you may need to reduce your sodium intake to 1,500 mg a day.  Work with your health care provider to maintain a healthy body weight or to lose weight. Ask what an ideal weight is for you.  Get at least 30 minutes of exercise that causes your heart to beat faster (aerobic exercise) most days of the week. Activities may include walking, swimming, or biking.  Work with your health care provider or dietitian to adjust your eating plan to your individual calorie needs. What foods should I eat? Fruits All fresh, dried, or frozen fruit. Canned fruit in natural juice (without added sugar). Vegetables Fresh or frozen vegetables (raw, steamed, roasted,  or grilled). Low-sodium or reduced-sodium tomato and vegetable juice. Low-sodium or reduced-sodium tomato sauce and tomato paste. Low-sodium or reduced-sodium canned vegetables. Grains Whole-grain or whole-wheat bread. Whole-grain or whole-wheat pasta. Brown rice. [place]. Bulgur. Whole-grain and low-sodium cereals. Pita bread. Low-fat, low-sodium crackers. Whole-wheat flour tortillas. Meats and other proteins Skinless chicken or [place]. Ground chicken or [place]. Pork with fat trimmed off. Fish and seafood. Egg whites. Dried beans, peas, or lentils. Unsalted nuts, nut butters, and seeds. Unsalted canned beans. Lean cuts of beef with fat trimmed off. Low-sodium, lean precooked or cured meat, such as sausages or meat loaves. Dairy Low-fat (1%) or fat-free (skim) milk. Reduced-fat, low-fat, or fat-free cheeses. Nonfat, low-sodium ricotta or cottage cheese. Low-fat or nonfat yogurt. Low-fat, low-sodium cheese. Fats and oils Soft margarine without trans fats. Vegetable oil. Reduced-fat, low-fat, or light mayonnaise and salad dressings (reduced-sodium). Canola, safflower, olive, avocado, soybean, and sunflower oils. Avocado. Seasonings and condiments Herbs. Spices. Seasoning mixes without salt. Other foods Unsalted popcorn and pretzels. Fat-free sweets. The  items listed above may not be a complete list of foods and beverages you can eat. Contact a dietitian for more information. What foods should I avoid? Fruits Canned fruit in a light or heavy syrup. Fried fruit. Fruit in cream or butter sauce. Vegetables Creamed or fried vegetables. Vegetables in a cheese sauce. Regular canned vegetables (not low-sodium or reduced-sodium). Regular canned tomato sauce and paste (not low-sodium or reduced-sodium). Regular tomato and vegetable juice (not low-sodium or reduced-sodium). Angie Fava. Olives. Grains Baked goods made with fat, such as croissants, muffins, or some breads. Dry pasta or rice meal  packs. Meats and other proteins Fatty cuts of meat. Ribs. Fried meat. Berniece Salines. Bologna, salami, and other precooked or cured meats, such as sausages or meat loaves. Fat from the back of a pig (fatback). Bratwurst. Salted nuts and seeds. Canned beans with added salt. Canned or smoked fish. Whole eggs or egg yolks. Chicken or [place] with skin. Dairy Whole or 2% milk, cream, and half-and-half. Whole or full-fat cream cheese. Whole-fat or sweetened yogurt. Full-fat cheese. Nondairy creamers. Whipped toppings. Processed cheese and cheese spreads. Fats and oils Butter. Stick margarine. Lard. Shortening. Ghee. Bacon fat. Tropical oils, such as coconut, palm kernel, or palm oil. Seasonings and condiments Onion salt, garlic salt, seasoned salt, table salt, and sea salt. Worcestershire sauce. Tartar sauce. Barbecue sauce. Teriyaki sauce. Soy sauce, including reduced-sodium. Steak sauce. Canned and packaged gravies. Fish sauce. Oyster sauce. Cocktail sauce. Store-bought horseradish. Ketchup. Mustard. Meat flavorings and tenderizers. Bouillon cubes. Hot sauces. Pre-made or packaged marinades. Pre-made or packaged taco seasonings. Relishes. Regular salad dressings. Other foods Salted popcorn and pretzels. The items listed above may not be a complete list of foods and beverages you should avoid. Contact a dietitian for more information. Where to find more information  National Heart, Lung, and Blood Institute: https://wilson-eaton.com/  American Heart Association: www.heart.org  Academy of Nutrition and Dietetics: www.eatright.[place]: www.kidney.org Summary  The DASH eating plan is a healthy eating plan that has been shown to reduce high blood pressure (hypertension). It may also reduce your risk for type 2 diabetes, heart disease, and stroke.  When on the DASH eating plan, aim to eat more fresh fruits and vegetables, whole grains, lean proteins, low-fat dairy, and heart-healthy  fats.  With the DASH eating plan, you should limit salt (sodium) intake to 2,300 mg a day. If you have hypertension, you may need to reduce your sodium intake to 1,500 mg a day.  Work with your health care provider or dietitian to adjust your eating plan to your individual calorie needs. This information is not intended to replace advice given to you by your health care provider. Make sure you discuss any questions you have with your health care provider. Document Revised: 02/11/2019 Document Reviewed: 02/11/2019 Elsevier Patient Education  2021 [employer].

## 2020-06-28 ENCOUNTER — Telehealth: Payer: Self-pay | Admitting: Pharmacist

## 2020-06-28 NOTE — Chronic Care Management (AMB) (Signed)
Chronic Care Management Pharmacy Assistant   Name: Laurie Dominguez  MRN: [id number] DOB: November 11, 1950  Reason for Encounter: Disease State   Conditions to be addressed/monitored: COPD  Recent office visits:  None  Recent consult visits:  None  Hospital visits:  None in previous 6 months  Medications: Outpatient Encounter Medications as of 06/28/2020  Medication Sig  . albuterol (PROAIR HFA) 108 (90 Base) MCG/ACT inhaler Inhale 2 puffs into the lungs every 6 (six) hours as needed for wheezing or shortness of breath  . amLODipine (NORVASC) 5 MG tablet TAKE 1 TABLET BY MOUTH  DAILY  . budesonide-formoterol (SYMBICORT) 160-4.5 MCG/ACT inhaler Inhale 2 puffs into the lungs 2 (two) times daily.  . diazepam (VALIUM) 2 MG tablet Take 1 tablet (2 mg total) by mouth at bedtime as needed for anxiety or muscle spasms.  . fluticasone (FLONASE) 50 MCG/ACT nasal spray Place 2 sprays into both nostrils daily.  . furosemide (LASIX) 20 MG tablet TAKE 1 TABLET BY MOUTH  DAILY  . hydrocortisone 2.5 % ointment Apply topically 2 (two) times daily as needed.  Marland Kitchen losartan-hydrochlorothiazide (HYZAAR) 100-25 MG tablet Take 1 tablet by mouth once daily  . meclizine (ANTIVERT) 25 MG tablet Take 1 tablet (25 mg total) by mouth daily as needed. (Patient not taking: Reported on 06/12/2020)  . naproxen (NAPROSYN) 500 MG tablet TAKE 1 TABLET BY MOUTH TWO  TIMES DAILY WITH MEALS  . omeprazole (PRILOSEC) 20 MG capsule TAKE 1 CAPSULE BY MOUTH  DAILY  . PARoxetine (PAXIL) 30 MG tablet TAKE 2 TABLETS BY MOUTH IN  THE MORNING  . rosuvastatin (CRESTOR) 5 MG tablet Take 1 tablet (5 mg total) by mouth every other day.  . traMADol (ULTRAM) 50 MG tablet Take 1 tablet (50 mg total) by mouth every 6 (six) hours as needed for moderate pain or severe pain.  Marland Kitchen triamcinolone (KENALOG) 0.025 % cream Apply 1 application topically 2 (two) times daily.   No facility-administered encounter medications on file as of 06/28/2020.    . Current COPD regimen:   Symbicort 160-4.5 mcg/act 2 puffs twice daily  Albuterol HFA 2 puffs every 6 hours as needed . Any recent hospitalizations or ED visits since last visit with CPP? No . Denies COPD symptoms, including Increased shortness of breath , Rescue medicine is not helping, Shortness of breath at rest, Symptoms worse with exercise, Symptoms worse at night and Wheezing . What recent interventions/DTPs have been made by any provider to improve breathing since last visit: . Have you had exacerbation/flare-up since last visit? No . What do you do when you are short of breath?  Adhere to COPD Action Plan  Respiratory Devices/Equipment . Do you have a nebulizer? No . Do you use a Peak Flow Meter? No . Do you use a maintenance inhaler? No . How often do you forget to use your daily inhaler? Never . Do you use a rescue inhaler? Yes . How often do you use your rescue inhaler?  infrequently . Do you use a spacer with your inhaler? No  Adherence Review: Does the patient have >5 day gap between last estimated fill date for maintenance inhaler medications? No  I spoke with the patient and discussed medication adherence with the patient. I question the patient about the effects of the changes in her medications from her last appointment. She is using the Symbicort 160-4.5 mcg/act 2 puffs twice daily. She states she has not had any issues with the changes. She states that  her only concern currently is the pollen count. She denies any side effects from her medications. She denies ED visits since his last CPP follow-up. Also, denies any problems with her current pharmacy.  Star Rating Drugs:  Dispensed Quantity Pharmacy  Losartan/HCTZ 12.29.2021 90 [facility]  Rosuvastatin 02.04.2022 90 [facility]   Laurie Dominguez, [place] [phone]

## 2020-07-06 DIAGNOSIS — J9601 Acute respiratory failure with hypoxia: Secondary | ICD-10-CM | POA: Diagnosis not present

## 2020-08-05 DIAGNOSIS — J9601 Acute respiratory failure with hypoxia: Secondary | ICD-10-CM | POA: Diagnosis not present

## 2020-08-24 ENCOUNTER — Telehealth: Payer: Self-pay | Admitting: Pharmacist

## 2020-08-26 NOTE — Chronic Care Management (AMB) (Signed)
Chronic Care Management Pharmacy Assistant   Name: Laurie Dominguez  MRN: [id number] DOB: 1950/11/26  Reason for Encounter: Disease State   Conditions to be addressed/monitored: HTN  Recent office visits:  None  Recent consult visits:  None  Hospital visits:  None in previous 6 months  Medications: Outpatient Encounter Medications as of 08/24/2020  Medication Sig  . albuterol (PROAIR HFA) 108 (90 Base) MCG/ACT inhaler Inhale 2 puffs into the lungs every 6 (six) hours as needed for wheezing or shortness of breath  . amLODipine (NORVASC) 5 MG tablet TAKE 1 TABLET BY MOUTH  DAILY  . budesonide-formoterol (SYMBICORT) 160-4.5 MCG/ACT inhaler Inhale 2 puffs into the lungs 2 (two) times daily.  . diazepam (VALIUM) 2 MG tablet Take 1 tablet (2 mg total) by mouth at bedtime as needed for anxiety or muscle spasms.  . furosemide (LASIX) 20 MG tablet TAKE 1 TABLET BY MOUTH  DAILY  . hydrocortisone 2.5 % ointment Apply topically 2 (two) times daily as needed.  Marland Kitchen losartan-hydrochlorothiazide (HYZAAR) 100-25 MG tablet Take 1 tablet by mouth once daily  . meclizine (ANTIVERT) 25 MG tablet Take 1 tablet (25 mg total) by mouth daily as needed. (Patient not taking: Reported on 06/12/2020)  . naproxen (NAPROSYN) 500 MG tablet TAKE 1 TABLET BY MOUTH TWO  TIMES DAILY WITH MEALS  . omeprazole (PRILOSEC) 20 MG capsule TAKE 1 CAPSULE BY MOUTH  DAILY  . PARoxetine (PAXIL) 30 MG tablet TAKE 2 TABLETS BY MOUTH IN  THE MORNING  . rosuvastatin (CRESTOR) 5 MG tablet Take 1 tablet (5 mg total) by mouth every other day.  . traMADol (ULTRAM) 50 MG tablet Take 1 tablet (50 mg total) by mouth every 6 (six) hours as needed for moderate pain or severe pain.  Marland Kitchen triamcinolone (KENALOG) 0.025 % cream Apply 1 application topically 2 (two) times daily.   No facility-administered encounter medications on file as of 08/24/2020.    Reviewed chart prior to disease state call. Spoke with patient regarding BP  Recent  Office Vitals: BP Readings from Last 3 Encounters:  01/11/20 130/70  12/07/19 120/70  11/08/19 118/62   Pulse Readings from Last 3 Encounters:  01/11/20 85  12/07/19 95  09/20/18 95    Wt Readings from Last 3 Encounters:  01/11/20 281 lb 3.2 oz (127.6 kg)  12/07/19 280 lb 6.4 oz (127.2 kg)  11/08/19 277 lb (125.6 kg)     Kidney Function Lab Results  Component Value Date/Time   CREATININE 0.74 11/08/2019 10:09 AM   CREATININE 0.65 10/26/2018 10:42 AM   CREATININE 0.65 09/28/2017 10:09 AM   GFR 90.27 10/26/2018 10:42 AM   GFRNONAA 82 11/08/2019 10:09 AM   GFRAA 95 11/08/2019 10:09 AM    BMP Latest Ref Rng & Units 11/08/2019 10/26/2018 09/28/2017  Glucose 65 - 99 mg/dL 108(H) 104(H) 109(H)  BUN 7 - 25 mg/dL 21 21 14   Creatinine 0.60 - 0.93 mg/dL 0.74 0.65 0.65  BUN/Creat Ratio 6 - 22 (calc) NOT APPLICABLE - -  Sodium 762 - 146 mmol/L 146 143 142  Potassium 3.5 - 5.3 mmol/L 3.7 3.8 3.7  Chloride 98 - 110 mmol/L 97(L) 98 100  CO2 20 - 32 mmol/L 40(H) 37(H) 33(H)  Calcium 8.6 - 10.4 mg/dL 9.4 9.3 9.3   . Current antihypertensive regimen:  o Amlodipine 5 mg 1 tablet daily o Losartan-HCTZ 100-25 1 tablet daily . How often are you checking your Blood Pressure? 3-5x per week . Current home BP readings:  o 05.31 137/70 o 06.01 122/70 o 06.03 124/72 . What recent interventions/DTPs have been made by any provider to improve Blood Pressure control since last CPP Visit: None . Any recent hospitalizations or ED visits since last visit with CPP? No . What diet changes have been made to improve Blood Pressure Control?  o No Change . What exercise is being done to improve your Blood Pressure Control?  o Swimming twice a week  Adherence Review: Is the patient currently on ACE/ARB medication? Yes Does the patient have >5 day gap between last estimated fill dates? No  I spoke with the patient and discuss medication adherence. She states that she has been feeling well. She has increased  her water intake. She has also started back swimming. She states that she takes her blood pressure three times a week Monday Wednesday and Friday. There have been no recent changes to her medications. She states that she takes her medication as prescribed. She is not experiencing any side effects from her current medications. There have been no emergency department or urgent care visits since her last CPP or PCP visit period she denies any issues with her current. The patient is scheduled to follow up with CCM in September.   Star Rating Drugs:  Maia Breslow, [place] Pharmacist Assistant [phone]

## 2020-09-05 DIAGNOSIS — J9601 Acute respiratory failure with hypoxia: Secondary | ICD-10-CM | POA: Diagnosis not present

## 2020-09-19 ENCOUNTER — Other Ambulatory Visit: Payer: Self-pay | Admitting: Adult Health

## 2020-09-20 ENCOUNTER — Ambulatory Visit (INDEPENDENT_AMBULATORY_CARE_PROVIDER_SITE_OTHER): Payer: Medicare Other

## 2020-09-20 ENCOUNTER — Encounter (HOSPITAL_COMMUNITY): Payer: Self-pay

## 2020-09-20 ENCOUNTER — Ambulatory Visit (HOSPITAL_COMMUNITY)
Admission: EM | Admit: 2020-09-20 | Discharge: 2020-09-20 | Disposition: A | Discharge status: Home / Self Care | Payer: Medicare Other | Attending: Emergency Medicine | Admitting: Emergency Medicine

## 2020-09-20 ENCOUNTER — Other Ambulatory Visit: Payer: Self-pay

## 2020-09-20 DIAGNOSIS — M1711 Unilateral primary osteoarthritis, right knee: Secondary | ICD-10-CM | POA: Diagnosis not present

## 2020-09-20 DIAGNOSIS — M25561 Pain in right knee: Secondary | ICD-10-CM | POA: Diagnosis not present

## 2020-09-20 DIAGNOSIS — Z9981 Dependence on supplemental oxygen: Secondary | ICD-10-CM

## 2020-09-20 DIAGNOSIS — J449 Chronic obstructive pulmonary disease, unspecified: Secondary | ICD-10-CM
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
## 2020-09-20 NOTE — ED Triage Notes (Signed)
Pt states she is not aware of what might have caused the knee pain.   She states she cannot stand for long, states she has been very careful walking down the steps.

## 2020-09-20 NOTE — ED Triage Notes (Signed)
Pt presents with right knee pain x 2 days.   States it is difficult to apply pressure to it. Pt states standing on it causes more pain.   States she took Naproxen that has given no relief.

## 2020-09-20 NOTE — Discharge Instructions (Addendum)
Your x-ray was negative for any acute findings.  Rest,ice,elevate, use naproxen as prescribed. Follow up with Orthopedics-call for appt.

## 2020-09-25 ENCOUNTER — Encounter: Payer: Self-pay | Admitting: Adult Health

## 2020-09-25 ENCOUNTER — Ambulatory Visit (INDEPENDENT_AMBULATORY_CARE_PROVIDER_SITE_OTHER): Payer: Medicare Other | Admitting: Adult Health

## 2020-09-25 ENCOUNTER — Other Ambulatory Visit: Payer: Self-pay

## 2020-09-25 VITALS — BP 122/80 | HR 72 | Temp 98.5°F | Ht 64.0 in

## 2020-09-25 DIAGNOSIS — M1711 Unilateral primary osteoarthritis, right knee: Secondary | ICD-10-CM | POA: Diagnosis not present

## 2020-09-25 MED ORDER — METHYLPREDNISOLONE ACETATE 80 MG/ML IJ SUSP
80.0000 mg | Freq: Once | INTRAMUSCULAR | Status: AC
  Administered 2020-09-25: 80 mg via INTRA_ARTICULAR

## 2020-09-25 NOTE — Patient Instructions (Signed)
It was great seeing you today   We injected your knee with a steroid to help with the pain.   I have also referred you over to [facility] to see Dr. Maureen Ralphs

## 2020-09-25 NOTE — Progress Notes (Signed)
Subjective:    Patient ID: Laurie Dominguez, female    DOB: 02-24-1951, 70 y.o.   MRN: [id number]  HPI 70 year old female who  has a past medical history of Colon polyp (08/16/2004), Depression, GERD (gastroesophageal reflux disease), Hypertension, Nephrolithiasis, and Obesity.  She presents to the office today for right knee pain.  Urgent care on 09/20/2020 where she was complaining of right knee pain for 2 days.  She denied any injury.  Was taking over-the-counter naproxen for pain.  She did not get much improvement from the over-the-counter medications.  Her xray showed Tricompartmental degenerative changes are noted worst in the medial joint space. No joint effusion is seen. No acute fracture or dislocation is noted.  Was advised to follow-up with orthopedics, call and make her own appointment.  Today she would like to be referred to orthopedics but is also wondering if there is something that can be done in the meantime to help alleviate her pain.   Review of Systems See HPI   Past Medical History:  Diagnosis Date   Colon polyp 08/16/2004   Hyperplastic   Depression    GERD (gastroesophageal reflux disease)    Hypertension    Nephrolithiasis    Obesity     Social History   Socioeconomic History   Marital status: Married    Spouse name: Not on file   Number of children: Not on file   Years of education: Not on file   Highest education level: Not on file  Occupational History   Not on file  Tobacco Use   Smoking status: Former    Packs/day: 2.00    Years: 40.00    Pack years: 80.00    Types: Cigarettes    Quit date: 03/24/2006    Years since quitting: 14.5   Smokeless tobacco: Never  Substance and Sexual Activity   Alcohol use: No    Alcohol/week: 0.0 standard drinks   Drug use: No   Sexual activity: Not on file  Other Topics Concern   Not on file  Social History Narrative   Not on file   Social Determinants of Health   Financial Resource Strain: Low  Risk    Difficulty of Paying Living Expenses: Not hard at all  Food Insecurity: No Food Insecurity   Worried About Charity fundraiser in the Last Year: Never true   [place] in the Last Year: Never true  Transportation Needs: No Transportation Needs   Lack of Transportation (Medical): No   Lack of Transportation (Non-Medical): No  Physical Activity: Inactive   Days of Exercise per Week: 0 days   Minutes of Exercise per Session: 0 min  Stress: No Stress Concern Present   Feeling of Stress : Not at all  Social Connections: Moderately Integrated   Frequency of Communication with Friends and Family: Three times a week   Frequency of Social Gatherings with Friends and Family: Once a week   Attends Religious Services: More than 4 times per year   Active Member of [employer] or Organizations: No   Attends Archivist Meetings: Never   Marital Status: Married  Human resources officer Violence: Not At Risk   Fear of Current or Ex-Partner: No   Emotionally Abused: No   Physically Abused: No   Sexually Abused: No    Past Surgical History:  Procedure Laterality Date   BREAST BIOPSY Left 12/2017   CHOLECYSTECTOMY     COLONOSCOPY  2006   DILATION  AND CURETTAGE OF UTERUS     MOHS SURGERY     TONSILLECTOMY      Family History  Problem Relation Age of Onset   Cancer Mother        unknown primary   Liver cancer Mother        unknown primary   Other Father        spinal cord tumor   Breast cancer Neg Hx     Allergies  Allergen Reactions   Codeine Phosphate    Simvastatin     Myalgia     Current Outpatient Medications on File Prior to Visit  Medication Sig Dispense Refill   albuterol (PROAIR HFA) 108 (90 Base) MCG/ACT inhaler Inhale 2 puffs into the lungs every 6 (six) hours as needed for wheezing or shortness of breath 24 g 1   amLODipine (NORVASC) 5 MG tablet TAKE 1 TABLET BY MOUTH  DAILY 90 tablet 3   budesonide-formoterol (SYMBICORT) 160-4.5 MCG/ACT inhaler Inhale 2  puffs into the lungs 2 (two) times daily. 1 each 5   diazepam (VALIUM) 2 MG tablet Take 1 tablet (2 mg total) by mouth at bedtime as needed for anxiety or muscle spasms. 30 tablet 0   furosemide (LASIX) 20 MG tablet TAKE 1 TABLET BY MOUTH  DAILY 90 tablet 3   hydrocortisone 2.5 % ointment Apply topically 2 (two) times daily as needed. 30 g 3   losartan-hydrochlorothiazide (HYZAAR) 100-25 MG tablet Take 1 tablet by mouth once daily 90 tablet 3   meclizine (ANTIVERT) 25 MG tablet Take 1 tablet (25 mg total) by mouth daily as needed. 90 tablet 1   naproxen (NAPROSYN) 500 MG tablet TAKE 1 TABLET BY MOUTH TWO  TIMES DAILY WITH MEALS 180 tablet 3   omeprazole (PRILOSEC) 20 MG capsule TAKE 1 CAPSULE BY MOUTH  DAILY 90 capsule 3   PARoxetine (PAXIL) 30 MG tablet TAKE 2 TABLETS BY MOUTH IN  THE MORNING 180 tablet 3   rosuvastatin (CRESTOR) 5 MG tablet Take 1 tablet (5 mg total) by mouth every other day. 90 tablet 1   traMADol (ULTRAM) 50 MG tablet Take 1 tablet (50 mg total) by mouth every 6 (six) hours as needed for moderate pain or severe pain. 30 tablet 2   triamcinolone (KENALOG) 0.025 % cream Apply 1 application topically 2 (two) times daily.     fluticasone (FLONASE) 50 MCG/ACT nasal spray Place 2 sprays into both nostrils daily. 1 g 0   No current facility-administered medications on file prior to visit.    BP 122/80   Pulse 72   Temp 98.5 F (36.9 C) (Oral)   Ht 5\' 4"  (1.626 m)   SpO2 (!) 89%   BMI 48.27 kg/m       Objective:   Physical Exam Vitals and nursing note reviewed.  Constitutional:      Appearance: Normal appearance. She is obese.  Musculoskeletal:     Right knee: Bony tenderness and crepitus present. No effusion or erythema. Decreased range of motion. No tenderness. No LCL laxity, MCL laxity, ACL laxity or PCL laxity. Normal alignment, normal meniscus and normal patellar mobility. Normal pulse.     Left knee: Normal.  Skin:    General: Skin is warm and dry.      Capillary Refill: Capillary refill takes less than 2 seconds.  Neurological:     Mental Status: She is alert.       Assessment & Plan:   1. Tricompartment osteoarthritis of right  knee - Discussed options for right knee pain. PT,  steroid injection, etc. Ultimately decided on steroid injection  - AMB referral to orthopedics - methylPREDNISolone acetate (DEPO-MEDROL) injection 80 mg  Discussed risks and benefits of corticosteroid injection and patient consented.  After prepping skin with betadine, injected 80 mg depomedrol and 2 cc of plain xylocaine with 22 gauge one and one half inch needle using anterolateral approach and pt tolerated well.  [name], NP

## 2020-10-01 ENCOUNTER — Other Ambulatory Visit: Payer: Self-pay | Admitting: Adult Health

## 2020-10-05 DIAGNOSIS — J9601 Acute respiratory failure with hypoxia: Secondary | ICD-10-CM | POA: Diagnosis not present

## 2020-10-09 ENCOUNTER — Other Ambulatory Visit: Payer: Self-pay | Admitting: Adult Health

## 2020-10-09 DIAGNOSIS — Z760 Encounter for issue of repeat prescription: Secondary | ICD-10-CM

## 2020-10-09 NOTE — Telephone Encounter (Signed)
Okay for refill?    LOV 09/25/20  Tramadol Last Refill   30    QTY.   2      Refills  Albuterol Last Refill   11/01/2019    24g QTY. 1   Refills  Hydrocortisone Last Refill   10/29/2018    30gQTY.   3   Refills

## 2020-10-10 ENCOUNTER — Other Ambulatory Visit: Payer: Self-pay

## 2020-10-10 MED ORDER — BUDESONIDE-FORMOTEROL FUMARATE 160-4.5 MCG/ACT IN AERO: 2.0000 | INHALATION_SPRAY | Freq: Two times a day (BID) | RESPIRATORY_TRACT | 0 refills | Status: DC

## 2020-10-15 ENCOUNTER — Other Ambulatory Visit (INDEPENDENT_AMBULATORY_CARE_PROVIDER_SITE_OTHER): Payer: Self-pay | Admitting: Otolaryngology

## 2020-11-01 ENCOUNTER — Ambulatory Visit: Payer: Medicare Other

## 2020-11-07 ENCOUNTER — Ambulatory Visit (INDEPENDENT_AMBULATORY_CARE_PROVIDER_SITE_OTHER): Payer: Medicare Other

## 2020-11-07 ENCOUNTER — Other Ambulatory Visit: Payer: Self-pay

## 2020-11-07 DIAGNOSIS — Z0000 Encounter for general adult medical examination without abnormal findings: Secondary | ICD-10-CM | POA: Diagnosis not present

## 2020-11-07 NOTE — Patient Instructions (Addendum)
Laurie Dominguez , Thank you for taking time to come for your Medicare Wellness Visit. I appreciate your ongoing commitment to your health goals. Please review the following plan we discussed and let me know if I can assist you in the future.   Screening recommendations/referrals: Colonoscopy: Cologuard completed 11/15/18 repeat every 3 years 11/14/21 Mammogram: Done 04/12/19 repeat every year Bone Density: Done 01/06/18 repeat every 2 years  Recommended yearly ophthalmology/optometry visit for glaucoma screening and checkup Recommended yearly dental visit for hygiene and checkup  Vaccinations: Influenza vaccine: Due Pneumococcal vaccine: Completed Tdap vaccine: Due and discussed Shingles vaccine: Shingrix discussed. Please contact your pharmacy for coverage information.    Covid-19:Completed 2/22, 3/15, & 01/05/20  Advanced directives: Please bring a copy of your health care power of attorney and living will to the office at your convenience.  Conditions/risks identified: Lose weight   Next appointment: Follow up in one year for your annual wellness visit    Preventive Care 65 Years and Older, Female Preventive care refers to lifestyle choices and visits with your health care provider that can promote health and wellness. What does preventive care include? A yearly physical exam. This is also called an annual well check. Dental exams once or twice a year. Routine eye exams. Ask your health care provider how often you should have your eyes checked. Personal lifestyle choices, including: Daily care of your teeth and gums. Regular physical activity. Eating a healthy diet. Avoiding tobacco and drug use. Limiting alcohol use. Practicing safe sex. Taking low-dose aspirin every day. Taking vitamin and mineral supplements as recommended by your health care provider. What happens during an annual well check? The services and screenings done by your health care provider during your annual well  check will depend on your age, overall health, lifestyle risk factors, and family history of disease. Counseling  Your health care provider may ask you questions about your: Alcohol use. Tobacco use. Drug use. Emotional well-being. Home and relationship well-being. Sexual activity. Eating habits. History of falls. Memory and ability to understand (cognition). Work and work Statistician. Reproductive health. Screening  You may have the following tests or measurements: Height, weight, and BMI. Blood pressure. Lipid and cholesterol levels. These may be checked every 5 years, or more frequently if you are over 10 years old. Skin check. Lung cancer screening. You may have this screening every year starting at age 32 if you have a 30-pack-year history of smoking and currently smoke or have quit within the past 15 years. Fecal occult blood test (FOBT) of the stool. You may have this test every year starting at age 7. Flexible sigmoidoscopy or colonoscopy. You may have a sigmoidoscopy every 5 years or a colonoscopy every 10 years starting at age 43. Hepatitis C blood test. Hepatitis B blood test. Sexually transmitted disease (STD) testing. Diabetes screening. This is done by checking your blood sugar (glucose) after you have not eaten for a while (fasting). You may have this done every 1-3 years. Bone density scan. This is done to screen for osteoporosis. You may have this done starting at age 23. Mammogram. This may be done every 1-2 years. Talk to your health care provider about how often you should have regular mammograms. Talk with your health care provider about your test results, treatment options, and if necessary, the need for more tests. Vaccines  Your health care provider may recommend certain vaccines, such as: Influenza vaccine. This is recommended every year. Tetanus, diphtheria, and acellular pertussis (Tdap, Td) vaccine.  You may need a Td booster every 10 years. Zoster  vaccine. You may need this after age 16. Pneumococcal 13-valent conjugate (PCV13) vaccine. One dose is recommended after age 84. Pneumococcal polysaccharide (PPSV23) vaccine. One dose is recommended after age 9. Talk to your health care provider about which screenings and vaccines you need and how often you need them. This information is not intended to replace advice given to you by your health care provider. Make sure you discuss any questions you have with your health care provider. Document Released: 04/06/2015 Document Revised: 11/28/2015 Document Reviewed: 01/09/2015 Elsevier Interactive Patient Education  2017 [employer] Prevention in the Home Falls can cause injuries. They can happen to people of all ages. There are many things you can do to make your home safe and to help prevent falls. What can I do on the outside of my home? Regularly fix the edges of walkways and driveways and fix any cracks. Remove anything that might make you trip as you walk through a door, such as a raised step or threshold. Trim any bushes or trees on the path to your home. Use bright outdoor lighting. Clear any walking paths of anything that might make someone trip, such as rocks or tools. Regularly check to see if handrails are loose or broken. Make sure that both sides of any steps have handrails. Any raised decks and porches should have guardrails on the edges. Have any leaves, snow, or ice cleared regularly. Use sand or salt on walking paths during winter. Clean up any spills in your garage right away. This includes oil or grease spills. What can I do in the bathroom? Use night lights. Install grab bars by the toilet and in the tub and shower. Do not use towel bars as grab bars. Use non-skid mats or decals in the tub or shower. If you need to sit down in the shower, use a plastic, non-slip stool. Keep the floor dry. Clean up any water that spills on the floor as soon as it happens. Remove  soap buildup in the tub or shower regularly. Attach bath mats securely with double-sided non-slip rug tape. Do not have throw rugs and other things on the floor that can make you trip. What can I do in the bedroom? Use night lights. Make sure that you have a light by your bed that is easy to reach. Do not use any sheets or blankets that are too big for your bed. They should not hang down onto the floor. Have a firm chair that has side arms. You can use this for support while you get dressed. Do not have throw rugs and other things on the floor that can make you trip. What can I do in the kitchen? Clean up any spills right away. Avoid walking on wet floors. Keep items that you use a lot in easy-to-reach places. If you need to reach something above you, use a strong step stool that has a grab bar. Keep electrical cords out of the way. Do not use floor polish or wax that makes floors slippery. If you must use wax, use non-skid floor wax. Do not have throw rugs and other things on the floor that can make you trip. What can I do with my stairs? Do not leave any items on the stairs. Make sure that there are handrails on both sides of the stairs and use them. Fix handrails that are broken or loose. Make sure that handrails are as long as  the stairways. Check any carpeting to make sure that it is firmly attached to the stairs. Fix any carpet that is loose or worn. Avoid having throw rugs at the top or bottom of the stairs. If you do have throw rugs, attach them to the floor with carpet tape. Make sure that you have a light switch at the top of the stairs and the bottom of the stairs. If you do not have them, ask someone to add them for you. What else can I do to help prevent falls? Wear shoes that: Do not have high heels. Have rubber bottoms. Are comfortable and fit you well. Are closed at the toe. Do not wear sandals. If you use a stepladder: Make sure that it is fully opened. Do not climb a  closed stepladder. Make sure that both sides of the stepladder are locked into place. Ask someone to hold it for you, if possible. Clearly mark and make sure that you can see: Any grab bars or handrails. First and last steps. Where the edge of each step is. Use tools that help you move around (mobility aids) if they are needed. These include: Canes. Walkers. Scooters. Crutches. Turn on the lights when you go into a dark area. Replace any light bulbs as soon as they burn out. Set up your furniture so you have a clear path. Avoid moving your furniture around. If any of your floors are uneven, fix them. If there are any pets around you, be aware of where they are. Review your medicines with your doctor. Some medicines can make you feel dizzy. This can increase your chance of falling. Ask your doctor what other things that you can do to help prevent falls. This information is not intended to replace advice given to you by your health care provider. Make sure you discuss any questions you have with your health care provider. Document Released: 01/04/2009 Document Revised: 08/16/2015 Document Reviewed: 04/14/2014 Elsevier Interactive Patient Education  2017 [employer].

## 2020-11-07 NOTE — Progress Notes (Signed)
Virtual Visit via Telephone Note  I connected with  Laurie Dominguez on 11/07/20 at  8:00 AM EDT by telephone and verified that I am speaking with the correct person using two identifiers.  Medicare Annual Wellness visit completed telephonically due to Covid-19 pandemic.   Persons participating in this call: This Health Coach and this patient.   Location: Patient: Home Provider: Office   I discussed the limitations, risks, security and privacy concerns of performing an evaluation and management service by telephone and the availability of in person appointments. The patient expressed understanding and agreed to proceed.  Unable to perform video visit due to video visit attempted and failed and/or patient does not have video capability.   Some vital signs may be absent or patient reported.   Willette Brace, LPN   Subjective:   Laurie Dominguez is a 70 y.o. female who presents for Medicare Annual (Subsequent) preventive examination.  Review of Systems     Cardiac Risk Factors include: obesity (BMI >30kg/m2);hypertension;advanced age (>43mn, >>73women)     Objective:    There were no vitals filed for this visit. There is no height or weight on file to calculate BMI.  Advanced Directives 11/07/2020 11/15/2019 04/05/2016 04/04/2016  Does Patient Have a Medical Advance Directive? Yes Yes No No  Type of Advance Directive Living [place] - -  Does patient want to make changes to medical advance directive? - No - Patient declined - -  [place] of [place]? - No - copy requested - -  Would patient like information on creating a medical advance directive? - - No - Patient declined -    Current Medications (verified) Outpatient Encounter Medications as of 11/07/2020  Medication Sig   albuterol (VENTOLIN HFA) 108 (90 Base) MCG/ACT inhaler USE 2 INHALATIONS BY MOUTH  EVERY 6 HOURS AS NEEDED FOR WHEEZING OR SHORTNESS OF   BREATH   amLODipine (NORVASC) 5 MG tablet TAKE 1 TABLET BY MOUTH  DAILY   budesonide-formoterol (SYMBICORT) 160-4.5 MCG/ACT inhaler Inhale 2 puffs into the lungs 2 (two) times daily. Need office visit for any additional refills   diazepam (VALIUM) 2 MG tablet Take 1 tablet (2 mg total) by mouth at bedtime as needed for anxiety or muscle spasms.   furosemide (LASIX) 20 MG tablet TAKE 1 TABLET BY MOUTH  DAILY   hydrocortisone 2.5 % ointment APPLY TOPICALLY TWICE DAILY AS NEEDED   losartan-hydrochlorothiazide (HYZAAR) 100-25 MG tablet TAKE 1 TABLET BY MOUTH  DAILY   meclizine (ANTIVERT) 25 MG tablet Take 1 tablet (25 mg total) by mouth daily as needed.   naproxen (NAPROSYN) 500 MG tablet TAKE 1 TABLET BY MOUTH TWO  TIMES DAILY WITH MEALS   omeprazole (PRILOSEC) 20 MG capsule TAKE 1 CAPSULE BY MOUTH  DAILY   PARoxetine (PAXIL) 30 MG tablet TAKE 2 TABLETS BY MOUTH IN  THE MORNING   rosuvastatin (CRESTOR) 5 MG tablet Take 1 tablet (5 mg total) by mouth every other day.   traMADol (ULTRAM) 50 MG tablet TAKE 1 TABLET BY MOUTH  EVERY 6 HOURS AS NEEDED FOR MODERATE PAIN OR SEVERE  PAIN   triamcinolone (KENALOG) 0.025 % cream Apply 1 application topically 2 (two) times daily.   fluticasone (FLONASE) 50 MCG/ACT nasal spray Place 2 sprays into both nostrils daily.   No facility-administered encounter medications on file as of 11/07/2020.    Allergies (verified) Codeine phosphate and Simvastatin   History: Past Medical  History:  Diagnosis Date   Colon polyp 08/16/2004   Hyperplastic   Depression    GERD (gastroesophageal reflux disease)    Hypertension    Nephrolithiasis    Obesity    Past Surgical History:  Procedure Laterality Date   BREAST BIOPSY Left 12/2017   CHOLECYSTECTOMY     COLONOSCOPY  2006   DILATION AND CURETTAGE OF UTERUS     MOHS SURGERY     TONSILLECTOMY     Family History  Problem Relation Age of Onset   Cancer Mother        unknown primary   Liver cancer Mother         unknown primary   Other Father        spinal cord tumor   Breast cancer Neg Hx    Social History   Socioeconomic History   Marital status: Married    Spouse name: Not on file   Number of children: Not on file   Years of education: Not on file   Highest education level: Not on file  Occupational History   Not on file  Tobacco Use   Smoking status: Former    Packs/day: 2.00    Years: 40.00    Pack years: 80.00    Types: Cigarettes    Quit date: 03/24/2006    Years since quitting: 14.6   Smokeless tobacco: Never  Substance and Sexual Activity   Alcohol use: No    Alcohol/week: 0.0 standard drinks   Drug use: No   Sexual activity: Not on file  Other Topics Concern   Not on file  Social History Narrative   Not on file   Social Determinants of Health   Financial Resource Strain: Low Risk    Difficulty of Paying Living Expenses: Not hard at all  Food Insecurity: No Food Insecurity   Worried About Charity fundraiser in the Last Year: Never true   Ran Out of Food in the Last Year: Never true  Transportation Needs: No Transportation Needs   Lack of Transportation (Medical): No   Lack of Transportation (Non-Medical): No  Physical Activity: Inactive   Days of Exercise per Week: 0 days   Minutes of Exercise per Session: 0 min  Stress: No Stress Concern Present   Feeling of Stress : Not at all  Social Connections: Moderately Integrated   Frequency of Communication with Friends and Family: More than three times a week   Frequency of Social Gatherings with Friends and Family: Once a week   Attends Religious Services: More than 4 times per year   Active Member of [employer] or Organizations: No   Attends Music therapist: Never   Marital Status: Married    Tobacco Counseling Counseling given: Not Answered   Clinical Intake:  Pre-visit preparation completed: Yes  Pain : No/denies pain     Nutritional Status: BMI > 30  Obese Nutritional Risks:  None Diabetes: No  How often do you need to have someone help you when you read instructions, pamphlets, or other written materials from your doctor or pharmacy?: 1 - Never  Diabetic?No  Interpreter Needed?: No  Information entered by :: Dayton Martes   Activities of Daily Living In your present state of health, do you have any difficulty performing the following activities: 11/07/2020 11/15/2019  Hearing? Y Y  Comment mild loss Has some difficulties with hearing  Vision? N N  Difficulty concentrating or making decisions? N N  Walking or climbing stairs?  Y Y  Comment - Has issues with knee pain, and breathing issues  Dressing or bathing? N N  Doing errands, shopping? N Y  Comment - Patient has a scooter and her husband goes with her  Conservation officer, nature and eating ? N N  Using the Toilet? N N  In the past six months, have you accidently leaked urine? N Y  Comment - Has some urine leakage  Do you have problems with loss of bowel control? N N  Managing your Medications? N N  Managing your Finances? N N  Housekeeping or managing your Housekeeping? - N  Some recent data might be hidden    Patient Care Team: [name], NP as PCP - General (Family Medicine) Griselda Miner, MD as Consulting Physician (Dermatology) Viona Gilmore, [facility] as Pharmacist (Pharmacist)  Indicate any recent Medical Services you may have received from other than [facility] providers in the past year (date may be approximate).     Assessment:   This is a routine wellness examination for [place].  Hearing/Vision screen Hearing Screening - Comments:: Pt stated mild loss Vision Screening - Comments:: Pt follows up with Dr Sabra Heck for annual eye exams   Dietary issues and exercise activities discussed: Current Exercise Habits: The patient does not participate in regular exercise at present   Goals Addressed             This Visit's Progress    Patient Stated       Lose weight         Depression Screen PHQ 2/9 Scores 11/07/2020 11/15/2019 11/08/2019 11/08/2019 10/26/2018 09/28/2017 09/26/2015  PHQ - 2 Score 0 0 0 0 0 0 1  PHQ- 9 Score - 0 - - - - -    Fall Risk Fall Risk  11/07/2020 11/15/2019 11/08/2019 11/08/2019 09/28/2017  Falls in the past year? 0 0 0 0 No  Number falls in past yr: 0 0 - - -  Injury with Fall? 0 0 - - -  Risk for fall due to : Impaired vision;Impaired mobility;Impaired balance/gait Impaired balance/gait;Medication side effect - - -  Follow up Falls prevention discussed Falls evaluation completed;Falls prevention discussed - - -    FALL RISK PREVENTION PERTAINING TO THE HOME:  Any stairs in or around the home? Yes  If so, are there any without handrails? No  Home free of loose throw rugs in walkways, pet beds, electrical cords, etc? Yes  Adequate lighting in your home to reduce risk of falls? Yes   ASSISTIVE DEVICES UTILIZED TO PREVENT FALLS:  Life alert? No  Use of a cane, walker or w/c? Yes  Grab bars in the bathroom? Yes  Shower chair or bench in shower? Yes  Elevated toilet seat or a handicapped toilet? Yes   TIMED UP AND GO:  Was the test performed? No .   Cognitive Function:     6CIT Screen 11/07/2020 11/15/2019  What Year? 0 points 0 points  What month? 0 points 0 points  What time? 0 points 0 points  Count back from 20 0 points 0 points  Months in reverse 0 points 0 points  Repeat phrase 0 points 0 points  Total Score 0 0    Immunizations Immunization History  Administered Date(s) Administered   Influenza Split 01/14/2012, 02/02/2013, 01/19/2017   Influenza Whole 01/05/1996   Influenza, High Dose Seasonal PF 02/12/2015, 12/06/2015, 12/29/2017, 12/22/2018, 12/22/2019   Influenza,inj,Quad PF,6+ Mos 01/12/2014   PFIZER(Purple Top)SARS-COV-2 Vaccination 05/16/2019, 06/06/2019, 01/05/2020  Pneumococcal Conjugate-13 09/26/2015, 12/22/2016   Pneumococcal Polysaccharide-23 09/28/2017, 12/22/2018   Tdap 10/09/2010   Zoster Recombinat  (Shingrix) 09/28/2017, 12/29/2017    TDAP status: Due, Education has been provided regarding the importance of this vaccine. Advised may receive this vaccine at local pharmacy or Health Dept. Aware to provide a copy of the vaccination record if obtained from local pharmacy or Health Dept. Verbalized acceptance and understanding.  Flu Vaccine status: Due, Education has been provided regarding the importance of this vaccine. Advised may receive this vaccine at local pharmacy or Health Dept. Aware to provide a copy of the vaccination record if obtained from local pharmacy or Health Dept. Verbalized acceptance and understanding.  Pneumococcal vaccine status: Up to date  Covid-19 vaccine status: Completed vaccines  Qualifies for Shingles Vaccine? Yes   Zostavax completed Yes   Shingrix Completed?: Yes  Screening Tests Health Maintenance  Topic Date Due   COVID-19 Vaccine (4 - Booster for Pfizer series) 05/07/2020   TETANUS/TDAP  10/08/2020   INFLUENZA VACCINE  10/22/2020   MAMMOGRAM  04/11/2021   Fecal DNA (Cologuard)  11/14/2021   DEXA SCAN  Completed   Hepatitis C Screening  Completed   PNA vac Low Risk Adult  Completed   Zoster Vaccines- Shingrix  Completed   HPV VACCINES  Aged Out    Health Maintenance  Health Maintenance Due  Topic Date Due   COVID-19 Vaccine (4 - Booster for Pfizer series) 05/07/2020   TETANUS/TDAP  10/08/2020   INFLUENZA VACCINE  10/22/2020    Colorectal cancer screening: Type of screening: Cologuard. Completed 11/15/18. Repeat every 3 years  Mammogram status: Completed 04/12/19. Repeat every year  Bone Density status: Completed 11/15/18. Results reflect: Bone density results: OSTEOPENIA. Repeat every 2 years.   Additional Screening:  Hepatitis C Screening:  Completed 09/28/17  Vision Screening: Recommended annual ophthalmology exams for early detection of glaucoma and other disorders of the eye. Is the patient up to date with their annual eye exam?   Yes  Who is the provider or what is the name of the office in which the patient attends annual eye exams? Dr Sabra Heck   If pt is not established with a provider, would they like to be referred to a provider to establish care? No .   Dental Screening: Recommended annual dental exams for proper oral hygiene  Community Resource Referral / Chronic Care Management: CRR required this visit?  No   CCM required this visit?  No      Plan:     I have personally reviewed and noted the following in the patient's chart:   Medical and social history Use of alcohol, tobacco or illicit drugs  Current medications and supplements including opioid prescriptions.  Functional ability and status Nutritional status Physical activity Advanced directives List of other physicians Hospitalizations, surgeries, and ER visits in previous 12 months Vitals Screenings to include cognitive, depression, and falls Referrals and appointments  In addition, I have reviewed and discussed with patient certain preventive protocols, quality metrics, and best practice recommendations. A written personalized care plan for preventive services as well as general preventive health recommendations were provided to patient.     Willette Brace, LPN   [id number]   Nurse Notes: None

## 2020-11-08 ENCOUNTER — Encounter: Payer: Self-pay | Admitting: Adult Health

## 2020-11-08 ENCOUNTER — Ambulatory Visit (INDEPENDENT_AMBULATORY_CARE_PROVIDER_SITE_OTHER): Payer: Medicare Other | Admitting: Adult Health

## 2020-11-08 VITALS — BP 102/60 | HR 87 | Temp 98.9°F

## 2020-11-08 DIAGNOSIS — K2100 Gastro-esophageal reflux disease with esophagitis, without bleeding: Secondary | ICD-10-CM

## 2020-11-08 DIAGNOSIS — L409 Psoriasis, unspecified: Secondary | ICD-10-CM | POA: Diagnosis not present

## 2020-11-08 DIAGNOSIS — J449 Chronic obstructive pulmonary disease, unspecified: Secondary | ICD-10-CM | POA: Diagnosis not present

## 2020-11-08 DIAGNOSIS — Z0000 Encounter for general adult medical examination without abnormal findings: Secondary | ICD-10-CM

## 2020-11-08 DIAGNOSIS — G4733 Obstructive sleep apnea (adult) (pediatric): Secondary | ICD-10-CM

## 2020-11-08 DIAGNOSIS — Z7409 Other reduced mobility: Secondary | ICD-10-CM

## 2020-11-08 DIAGNOSIS — I10 Essential (primary) hypertension: Secondary | ICD-10-CM | POA: Diagnosis not present

## 2020-11-08 DIAGNOSIS — R7302 Impaired glucose tolerance (oral): Secondary | ICD-10-CM | POA: Diagnosis not present

## 2020-11-08 DIAGNOSIS — F339 Major depressive disorder, recurrent, unspecified: Secondary | ICD-10-CM

## 2020-11-08 DIAGNOSIS — E1169 Type 2 diabetes mellitus with other specified complication: Secondary | ICD-10-CM | POA: Diagnosis not present

## 2020-11-08 DIAGNOSIS — E785 Hyperlipidemia, unspecified: Secondary | ICD-10-CM

## 2020-11-08 LAB — CBC WITH DIFFERENTIAL/PLATELET
Basophils Absolute: 0 10*3/uL (ref 0.0–0.1)
Basophils Relative: 0.8 % (ref 0.0–3.0)
Eosinophils Absolute: 0.1 10*3/uL (ref 0.0–0.7)
Eosinophils Relative: 1.8 % (ref 0.0–5.0)
HCT: 38.1 % (ref 36.0–46.0)
Hemoglobin: 12.3 g/dL (ref 12.0–15.0)
Lymphocytes Relative: 16.1 % (ref 12.0–46.0)
Lymphs Abs: 1 10*3/uL (ref 0.7–4.0)
MCHC: 32.2 g/dL (ref 30.0–36.0)
MCV: 92 fl (ref 78.0–100.0)
Monocytes Absolute: 0.5 10*3/uL (ref 0.1–1.0)
Monocytes Relative: 7.9 % (ref 3.0–12.0)
Neutro Abs: 4.7 10*3/uL (ref 1.4–7.7)
Neutrophils Relative %: 73.4 % (ref 43.0–77.0)
Platelets: 264 10*3/uL (ref 150.0–400.0)
RBC: 4.14 Mil/uL (ref 3.87–5.11)
RDW: 13.8 % (ref 11.5–15.5)
WBC: 6.3 10*3/uL (ref 4.0–10.5)

## 2020-11-08 LAB — COMPREHENSIVE METABOLIC PANEL
ALT: 8 U/L (ref 0–35)
AST: 9 U/L (ref 0–37)
Albumin: 3.8 g/dL (ref 3.5–5.2)
Alkaline Phosphatase: 104 U/L (ref 39–117)
BUN: 17 mg/dL (ref 6–23)
CO2: 40 mEq/L — ABNORMAL HIGH (ref 19–32)
Calcium: 9.2 mg/dL (ref 8.4–10.5)
Chloride: 98 mEq/L (ref 96–112)
Creatinine, Ser: 0.7 mg/dL (ref 0.40–1.20)
GFR: 87 mL/min (ref >60.00)
Glucose, Bld: 128 mg/dL — ABNORMAL HIGH (ref 70–99)
Potassium: 3.7 mEq/L (ref 3.5–5.1)
Sodium: 145 mEq/L (ref 135–145)
Total Bilirubin: 0.4 mg/dL (ref 0.2–1.2)
Total Protein: 6.8 g/dL (ref 6.0–8.3)

## 2020-11-08 LAB — HEMOGLOBIN A1C: Hgb A1c MFr Bld: 5.8 % (ref 4.6–6.5)

## 2020-11-08 LAB — TSH: TSH: 4.11 u[IU]/mL (ref 0.35–5.50)

## 2020-11-08 LAB — LIPID PANEL
Cholesterol: 162 mg/dL (ref 0–200)
HDL: 44.4 mg/dL (ref >39.00)
LDL Cholesterol: 100 mg/dL — ABNORMAL HIGH (ref 0–99)
NonHDL: 117.84
Total CHOL/HDL Ratio: 4
Triglycerides: 88 mg/dL (ref 0.0–149.0)
VLDL: 17.6 mg/dL (ref 0.0–40.0)

## 2020-11-08 NOTE — Patient Instructions (Signed)
It was great seeing you today   We will follow up with you regarding your blood work   Compression socks will help with the swelling. Look for 20-30 mmhg of compression

## 2020-11-08 NOTE — Progress Notes (Signed)
Subjective:    Patient ID: Laurie Dominguez, female    DOB: July 10, 1950, 70 y.o.   MRN: [id number]  HPI Patient presents for yearly preventative medicine examination. She is a pleasant 70 year old female who  has a past medical history of Colon polyp (08/16/2004), Depression, GERD (gastroesophageal reflux disease), Hypertension, Nephrolithiasis, and Obesity.  Essential Hypertension -controlled with Norvasc 5 mg, Hyzaar 100-25 mg, and Lasix 20 mg daily.  She denies dizziness, lightheadedness, chest pain, shortness of breath BP Readings from Last 3 Encounters:  11/08/20 102/60  09/25/20 122/80  09/20/20 119/68   OSA-unable to tolerate CPAP.  She is planning on talking to her pulmonologist about other options.  COPD -uses nocturnal oxygen as well as Symbicort.  Is followed by pulmonary.  She feels as though her symptoms are well controlled  Depression/Anxiety -takes Paxil 60 mg daily and valium 2 mg PRN. and feels well controlled.  She denies any depressive symptoms  GERD-controlled with Prilosec 20 mg daily  Osteoarthritis of bilateral knees-uses tramadol 50 mg as needed and naproxen as needed.  Does not ambulate much due to chronic pain.  Uses motorized wheelchair to get around.  In July 2022 we performed a steroid injection into her right knee and she did get relief with this injection.  Hyperlipidemia -tolerating Crestor 5 mg every other day.   Denies myalgia or fatigue Lab Results  Component Value Date   CHOL 210 (H) 11/08/2019   HDL 41 (L) 11/08/2019   LDLCALC 143 (H) 11/08/2019   LDLDIRECT 150.5 06/20/2008   TRIG 137 11/08/2019   CHOLHDL 5.1 (H) 11/08/2019   Psoriasis -managed by dermatology with steroid creams.  All immunizations and health maintenance protocols were reviewed with the patient and needed orders were placed.  Appropriate screening laboratory values were ordered for the patient including screening of hyperlipidemia, renal function and hepatic  function.   Medication reconciliation,  past medical history, social history, problem list and allergies were reviewed in detail with the patient  Goals were established with regard to weight loss, exercise, and  diet in compliance with medications.  Her diet has been poor and she knows she needs to work on a heart healthy diet with smaller portions.  She does try and get in the pool almost every day to do some aerobic exercise  Wt Readings from Last 3 Encounters:  01/11/20 281 lb 3.2 oz (127.6 kg)  12/07/19 280 lb 6.4 oz (127.2 kg)  11/08/19 277 lb (125.6 kg)    Review of Systems  Constitutional: Negative.   HENT: Negative.    Eyes: Negative.   Respiratory: Negative.    Cardiovascular:  Positive for leg swelling.  Gastrointestinal: Negative.   Endocrine: Negative.   Genitourinary: Negative.   Musculoskeletal:  Positive for arthralgias and gait problem.  Skin: Negative.   Allergic/Immunologic: Negative.   Hematological: Negative.   Psychiatric/Behavioral: Negative.     Past Medical History:  Diagnosis Date   Colon polyp 08/16/2004   Hyperplastic   Depression    GERD (gastroesophageal reflux disease)    Hypertension    Nephrolithiasis    Obesity     Social History   Socioeconomic History   Marital status: Married    Spouse name: Not on file   Number of children: Not on file   Years of education: Not on file   Highest education level: Not on file  Occupational History   Not on file  Tobacco Use   Smoking status: Former  Packs/day: 2.00    Years: 40.00    Pack years: 80.00    Types: Cigarettes    Quit date: 03/24/2006    Years since quitting: 14.6   Smokeless tobacco: Never  Substance and Sexual Activity   Alcohol use: No    Alcohol/week: 0.0 standard drinks   Drug use: No   Sexual activity: Not on file  Other Topics Concern   Not on file  Social History Narrative   Not on file   Social Determinants of Health   Financial Resource Strain: Low Risk     Difficulty of Paying Living Expenses: Not hard at all  Food Insecurity: No Food Insecurity   Worried About Charity fundraiser in the Last Year: Never true   Ran Out of Food in the Last Year: Never true  Transportation Needs: No Transportation Needs   Lack of Transportation (Medical): No   Lack of Transportation (Non-Medical): No  Physical Activity: Inactive   Days of Exercise per Week: 0 days   Minutes of Exercise per Session: 0 min  Stress: No Stress Concern Present   Feeling of Stress : Not at all  Social Connections: Moderately Integrated   Frequency of Communication with Friends and Family: More than three times a week   Frequency of Social Gatherings with Friends and Family: Once a week   Attends Religious Services: More than 4 times per year   Active Member of [employer] or Organizations: No   Attends Archivist Meetings: Never   Marital Status: Married  Human resources officer Violence: Not At Risk   Fear of Current or Ex-Partner: No   Emotionally Abused: No   Physically Abused: No   Sexually Abused: No    Past Surgical History:  Procedure Laterality Date   BREAST BIOPSY Left 12/2017   CHOLECYSTECTOMY     COLONOSCOPY  2006   DILATION AND CURETTAGE OF UTERUS     MOHS SURGERY     TONSILLECTOMY      Family History  Problem Relation Age of Onset   Cancer Mother        unknown primary   Liver cancer Mother        unknown primary   Other Father        spinal cord tumor   Breast cancer Neg Hx     Allergies  Allergen Reactions   Codeine Phosphate    Simvastatin     Myalgia     Current Outpatient Medications on File Prior to Visit  Medication Sig Dispense Refill   albuterol (VENTOLIN HFA) 108 (90 Base) MCG/ACT inhaler USE 2 INHALATIONS BY MOUTH  EVERY 6 HOURS AS NEEDED FOR WHEEZING OR SHORTNESS OF  BREATH 25.5 g 3   amLODipine (NORVASC) 5 MG tablet TAKE 1 TABLET BY MOUTH  DAILY 90 tablet 3   budesonide-formoterol (SYMBICORT) 160-4.5 MCG/ACT inhaler Inhale 2  puffs into the lungs 2 (two) times daily. Need office visit for any additional refills 30.6 each 0   fluticasone (FLONASE) 50 MCG/ACT nasal spray Place 2 sprays into both nostrils daily. 1 g 0   furosemide (LASIX) 20 MG tablet TAKE 1 TABLET BY MOUTH  DAILY 90 tablet 3   hydrocortisone 2.5 % ointment APPLY TOPICALLY TWICE DAILY AS NEEDED 28.35 g 3   losartan-hydrochlorothiazide (HYZAAR) 100-25 MG tablet TAKE 1 TABLET BY MOUTH  DAILY 90 tablet 3   omeprazole (PRILOSEC) 20 MG capsule TAKE 1 CAPSULE BY MOUTH  DAILY 90 capsule 3   PARoxetine (PAXIL)  30 MG tablet TAKE 2 TABLETS BY MOUTH IN  THE MORNING 180 tablet 3   rosuvastatin (CRESTOR) 5 MG tablet Take 1 tablet (5 mg total) by mouth every other day. 90 tablet 1   diazepam (VALIUM) 2 MG tablet Take 1 tablet (2 mg total) by mouth at bedtime as needed for anxiety or muscle spasms. (Patient not taking: Reported on 11/08/2020) 30 tablet 0   meclizine (ANTIVERT) 25 MG tablet Take 1 tablet (25 mg total) by mouth daily as needed. (Patient not taking: Reported on 11/08/2020) 90 tablet 1   naproxen (NAPROSYN) 500 MG tablet TAKE 1 TABLET BY MOUTH TWO  TIMES DAILY WITH MEALS (Patient not taking: Reported on 11/08/2020) 180 tablet 3   traMADol (ULTRAM) 50 MG tablet TAKE 1 TABLET BY MOUTH  EVERY 6 HOURS AS NEEDED FOR MODERATE PAIN OR SEVERE  PAIN (Patient not taking: Reported on 11/08/2020) 30 tablet 0   triamcinolone (KENALOG) 0.025 % cream Apply 1 application topically 2 (two) times daily.     No current facility-administered medications on file prior to visit.    BP 102/60   Pulse 87   Temp 98.9 F (37.2 C) (Oral)        Objective:   Physical Exam Vitals and nursing note reviewed.  Constitutional:      General: She is not in acute distress.    Appearance: Normal appearance. She is well-developed. She is obese. She is not ill-appearing.  HENT:     Head: Normocephalic and atraumatic.     Right Ear: Tympanic membrane, ear canal and external ear normal.  There is no impacted cerumen.     Left Ear: Tympanic membrane, ear canal and external ear normal. There is no impacted cerumen.     Nose: Nose normal. No congestion or rhinorrhea.     Mouth/Throat:     Mouth: Mucous membranes are moist.     Pharynx: Oropharynx is clear. No oropharyngeal exudate or posterior oropharyngeal erythema.  Eyes:     General:        Right eye: No discharge.        Left eye: No discharge.     Extraocular Movements: Extraocular movements intact.     Conjunctiva/sclera: Conjunctivae normal.     Pupils: Pupils are equal, round, and reactive to light.  Neck:     Thyroid: No thyromegaly.     Vascular: No carotid bruit.     Trachea: No tracheal deviation.  Cardiovascular:     Rate and Rhythm: Normal rate and regular rhythm.     Pulses: Normal pulses.     Heart sounds: Normal heart sounds. No murmur heard.   No friction rub. No gallop.  Pulmonary:     Effort: Pulmonary effort is normal. No respiratory distress.     Breath sounds: Normal breath sounds. No stridor. No wheezing, rhonchi or rales.  Chest:     Chest wall: No tenderness.  Abdominal:     General: Abdomen is flat. Bowel sounds are normal. There is no distension.     Palpations: Abdomen is soft. There is no mass.     Tenderness: There is no abdominal tenderness. There is no right CVA tenderness, left CVA tenderness, guarding or rebound.     Hernia: No hernia is present.  Musculoskeletal:        General: No swelling, tenderness, deformity or signs of injury. Normal range of motion.     Cervical back: Normal range of motion and neck supple.  Right lower leg: Edema present.     Left lower leg: Edema present.  Lymphadenopathy:     Cervical: No cervical adenopathy.  Skin:    General: Skin is warm and dry.     Capillary Refill: Capillary refill takes less than 2 seconds.     Coloration: Skin is not jaundiced or pale.     Findings: No bruising, erythema, lesion or rash.  Neurological:     General: No  focal deficit present.     Mental Status: She is alert and oriented to person, place, and time.     Cranial Nerves: No cranial nerve deficit.     Sensory: No sensory deficit.     Motor: Weakness present.     Coordination: Coordination normal.     Gait: Gait abnormal.     Deep Tendon Reflexes: Reflexes normal.     Comments: In motorized wheelchair for exam    Psychiatric:        Mood and Affect: Mood normal.        Behavior: Behavior normal.        Thought Content: Thought content normal.        Judgment: Judgment normal.      Assessment & Plan:  1. Routine general medical examination at a health care facility  - CBC with Differential/Platelet; Future - Comprehensive metabolic panel; Future - Lipid panel; Future - TSH; Future - Hemoglobin A1c; Future  2. COPD mixed type ([place]) - Continue with plan of care by pulmonary   3. Essential hypertension - Controlled.  - No change  - CBC with Differential/Platelet; Future - Comprehensive metabolic panel; Future - Lipid panel; Future - TSH; Future - Hemoglobin A1c; Future  4. Hyperlipidemia associated with type 2 diabetes mellitus ([place]) - Consider increase in statin  - CBC with Differential/Platelet; Future - Comprehensive metabolic panel; Future - Lipid panel; Future - TSH; Future - Hemoglobin A1c; Future  5. Mobility impaired   6. Obstructive sleep apnea - Follow up with pulmonary   7. Depression, recurrent ([place]) - Continue with Paxil and xanax  8. Gastroesophageal reflux disease with esophagitis, unspecified whether hemorrhage - Continue with PPI  9. Morbid obesity ([place]) - Encouraged heart healthy diet and smaller portions. Continue with swimming  - CBC with Differential/Platelet; Future - Comprehensive metabolic panel; Future - Lipid panel; Future - TSH; Future - Hemoglobin A1c; Future  10. Psoriasis - Follow up with Dermatology as directed  11. Impaired glucose tolerance - Consider metformin  - CBC with  Differential/Platelet; Future - Comprehensive metabolic panel; Future - Lipid panel; Future - TSH; Future - Hemoglobin A1c; Future  [name], NP

## 2020-11-23 ENCOUNTER — Other Ambulatory Visit: Payer: Self-pay | Admitting: Acute Care

## 2020-11-29 DIAGNOSIS — M25561 Pain in right knee: Secondary | ICD-10-CM | POA: Diagnosis not present

## 2020-12-05 ENCOUNTER — Encounter: Payer: Self-pay | Admitting: Acute Care

## 2020-12-05 ENCOUNTER — Ambulatory Visit: Payer: Medicare Other | Admitting: Acute Care

## 2020-12-05 ENCOUNTER — Other Ambulatory Visit: Payer: Self-pay

## 2020-12-05 VITALS — BP 122/74 | HR 78 | Temp 97.3°F | Ht 62.0 in | Wt 286.4 lb

## 2020-12-05 DIAGNOSIS — J449 Chronic obstructive pulmonary disease, unspecified: Secondary | ICD-10-CM | POA: Diagnosis not present

## 2020-12-05 DIAGNOSIS — E662 Morbid (severe) obesity with alveolar hypoventilation: Secondary | ICD-10-CM

## 2020-12-05 DIAGNOSIS — J9611 Chronic respiratory failure with hypoxia: Secondary | ICD-10-CM

## 2020-12-05 DIAGNOSIS — Z9981 Dependence on supplemental oxygen: Secondary | ICD-10-CM | POA: Diagnosis not present

## 2020-12-05 DIAGNOSIS — G4733 Obstructive sleep apnea (adult) (pediatric): Secondary | ICD-10-CM

## 2020-12-05 NOTE — Patient Instructions (Addendum)
It is good to see you today. Your oxygen saturation on room air at rest was 86%. This qualifies you for oxygen therapy. We will place an order for split night study since you have OSA to qualify you for your night time oxygen.  Try to remember to use your Symbicort  two times daily every day without fail.  Rinse mouth after use. Continue using your albuterol as rescue as needed.  Continue using Mucinex as you have been doing  Continue using Flonase as you have been doing.  Order for POC. Will refer to cardiology , I will let them order your heart echo.  Follow up in 1 months with Judson Roch NP or Dr. Annamaria Boots.  Consider Pulmonary rehab  We will refer to healthy weight and wellness clinic.  Wear your oxygen at 3 L at bedtime  Wear oxygen during the day for sats > 88% at all times . This is a line in the sand. Sats may not drop below 88%. Please contact office for sooner follow up if symptoms do not improve or worsen or seek emergency care

## 2020-12-05 NOTE — Progress Notes (Signed)
History of Present Illness Laurie Dominguez is a 70 y.o. female former smoker followed for OSA on CPAP, complicated by , depression, HBP, GERD, morbid obesity, obesity/hypoventilation syndrome She is followed by Laurie Dominguez   12/05/2020 Pt. Presents for follow up of chronic hypoxic/ hypercapneic respiratory failure use .She is not wearing her CPAP. She only wore it for one night. States she could not tolerate it. Instead she is wearing oxygen all night at 3 L . Upon arrival to the office today she had sats of 86% on RA. She has brought in oxygen qualification paperwork. She qualifies for her daytime oxygen based on her sats today in the office. She was offered oxygen by my nurse today, which she refused. Sats eventually returned to 90% on RA.  She has not been using her Symbicort. She is using her Albuterol a few times a day. She states she tries to go without her oxygen to challenge her lungs. We discussed any time her oxygen saturations drop below 88%, she is in actuality endangering her heart. I explained sats of < 88% are a line in the sand, and that oxygen must be worn unless she is accepting of the damage to her heart this causes.  She is in a scooter. She has a Body mass index is 52.38 kg/m. She does have occasional early morning headaches. She has daytime sleepiness and takes naps during the day. She has not worn her CPAP machine with the exception of 1 night in 2019.  She is not using her Symbicort regularly. We have discussed that she needs to do this. She has new swelling in her ankles. She is having a hard time getting  compression hose.that fit  She has not had an echo since 2019, she has never been seen by cards. She needs a cards referral, as she has several underlying conditions that put her at high risk for cardiac complications. . She has an order for Lasix, and she states that she is taking this. I will refer her to cardiology for evaluation and echo at their discretion.    She  has been non-compliant with her CPAP machine and I am worried this may have  had a negative affected her heart. She is aware of this.  Test Results: Office Spirometry 06/12/2016-moderate restriction of exhaled volume, moderate to severe obstructive airways disease-FVC 1.80/63%, FEV1 1.29/59%, ratio 0.71, FEF 25-75% 0.87/45%. morbid obesity, obesity/hypoventilation syndrome Unattended Home Sleep Test 07/20/16-AHI 28.4/hour, desaturation to 42% with average saturation 75%, body weight 279 pounds  CBC Latest Ref Rng & Units 11/08/2020 11/08/2019 10/26/2018  WBC 4.0 - 10.5 K/uL 6.3 7.2 7.7  Hemoglobin 12.0 - 15.0 g/dL 12.3 13.1 13.4  Hematocrit 36.0 - 46.0 % 38.1 41.2 40.7  Platelets 150.0 - 400.0 K/uL 264.0 314 333.0    BMP Latest Ref Rng & Units 11/08/2020 11/08/2019 10/26/2018  Glucose 70 - 99 mg/dL 128(H) 108(H) 104(H)  BUN 6 - 23 mg/dL '17 21 21  '$ Creatinine 0.40 - 1.20 mg/dL 0.70 0.74 0.65  BUN/Creat Ratio 6 - 22 (calc) - NOT APPLICABLE -  Sodium [id number] - 145 mEq/L 145 146 143  Potassium 3.5 - 5.1 mEq/L 3.7 3.7 3.8  Chloride 96 - 112 mEq/L 98 97(L) 98  CO2 19 - 32 mEq/L 40(H) 40(H) 37(H)  Calcium 8.4 - 10.5 mg/dL 9.2 9.4 9.3    BNP    Component Value Date/Time   BNP 20.4 04/04/2016 1805    ProBNP No results found for: PROBNP  PFT No results found for: FEV1PRE, FEV1POST, FVCPRE, FVCPOST, TLC, DLCOUNC, PREFEV1FVCRT, PSTFEV1FVCRT  No results found.   Past medical hx Past Medical History:  Diagnosis Date   Colon polyp 08/16/2004   Hyperplastic   Depression    GERD (gastroesophageal reflux disease)    Hypertension    Nephrolithiasis    Obesity      Social History   Tobacco Use   Smoking status: Former    Packs/day: 2.00    Years: 40.00    Pack years: 80.00    Types: Cigarettes    Quit date: 03/24/2006    Years since quitting: 14.7   Smokeless tobacco: Never  Substance Use Topics   Alcohol use: No    Alcohol/week: 0.0 standard drinks   Drug use: No    Laurie Dominguez reports  that she quit smoking about 14 years ago. Her smoking use included cigarettes. She has a 80.00 pack-year smoking history. She has never used smokeless tobacco. She reports that she does not drink alcohol and does not use drugs.  Tobacco Cessation: Former smoker with an 80 pack year smoking history   Past surgical hx, Family hx, Social hx all reviewed.  Current Outpatient Medications on File Prior to Visit  Medication Sig   albuterol (VENTOLIN HFA) 108 (90 Base) MCG/ACT inhaler USE 2 INHALATIONS BY MOUTH  EVERY 6 HOURS AS NEEDED FOR WHEEZING OR SHORTNESS OF  BREATH   amLODipine (NORVASC) 5 MG tablet TAKE 1 TABLET BY MOUTH  DAILY   diazepam (VALIUM) 2 MG tablet Take 1 tablet (2 mg total) by mouth at bedtime as needed for anxiety or muscle spasms.   furosemide (LASIX) 20 MG tablet TAKE 1 TABLET BY MOUTH  DAILY   hydrocortisone 2.5 % ointment APPLY TOPICALLY TWICE DAILY AS NEEDED   losartan-hydrochlorothiazide (HYZAAR) 100-25 MG tablet TAKE 1 TABLET BY MOUTH  DAILY   meclizine (ANTIVERT) 25 MG tablet Take 1 tablet (25 mg total) by mouth daily as needed.   naproxen (NAPROSYN) 500 MG tablet TAKE 1 TABLET BY MOUTH TWO  TIMES DAILY WITH MEALS (Patient taking differently: TAKE 1 TABLET BY MOUTH TWO  TIMES DAILY WITH MEALS)   omeprazole (PRILOSEC) 20 MG capsule TAKE 1 CAPSULE BY MOUTH  DAILY   PARoxetine (PAXIL) 30 MG tablet TAKE 2 TABLETS BY MOUTH IN  THE MORNING   rosuvastatin (CRESTOR) 5 MG tablet Take 1 tablet (5 mg total) by mouth every other day.   traMADol (ULTRAM) 50 MG tablet TAKE 1 TABLET BY MOUTH  EVERY 6 HOURS AS NEEDED FOR MODERATE PAIN OR SEVERE  PAIN   triamcinolone (KENALOG) 0.025 % cream Apply 1 application topically 2 (two) times daily.   budesonide-formoterol (SYMBICORT) 160-4.5 MCG/ACT inhaler Inhale 2 puffs into the lungs 2 (two) times daily. Need office visit for any additional refills (Patient not taking: Reported on 12/05/2020)   fluticasone (FLONASE) 50 MCG/ACT nasal spray Place  2 sprays into both nostrils daily.   No current facility-administered medications on file prior to visit.     Allergies  Allergen Reactions   Codeine Phosphate    Simvastatin     Myalgia     Review Of Systems:  Constitutional:   No  weight loss, night sweats,  Fevers, chills, +fatigue, or  lassitude.  HEENT:   occasional  headaches,  Difficulty swallowing,  Tooth/dental problems, or  Sore throat,                No sneezing, itching, ear ache, nasal congestion, post  nasal drip,   CV:  No chest pain,  Orthopnea, PND, ++ swelling in lower extremities, anasarca, dizziness, palpitations, syncope.   GI  No heartburn, indigestion, abdominal pain, nausea, vomiting, diarrhea, change in bowel habits, loss of appetite, bloody stools.   Resp: + shortness of breath with exertion or at rest.  No excess mucus, no productive cough,  No non-productive cough,  No coughing up of blood.  No change in color of mucus.  + wheezing.  No chest wall deformity  Skin: no rash or lesions.  GU: no dysuria, change in color of urine, no urgency or frequency.  No flank pain, no hematuria   MS:  No joint pain or swelling.  No decreased range of motion.  No back pain.  Psych:  No change in mood or affect. No depression or anxiety.  No memory loss.   Vital Signs BP 122/74 (BP Location: Left Wrist, Cuff Size: Normal)   Pulse 78   Temp (!) 97.3 F (36.3 C) (Oral)   Ht '5\' 2"'$  (1.575 m)   Wt 286 lb 6.4 oz (129.9 kg)   SpO2 90%   BMI 52.38 kg/m    Physical Exam:  General- No distress,  A&Ox3 ENT: No sinus tenderness, TM clear, pale nasal mucosa, no oral exudate,no post nasal drip, no LAN Cardiac: S1, S2, regular rate and rhythm, no murmur Chest: No wheeze/ rales/ dullness; no accessory muscle use, no nasal flaring, no sternal retractions Abd.: Soft Non-tender Ext: No clubbing cyanosis, edema Neuro:  normal strength Skin: No rashes, warm and dry Psych: normal mood and behavior   Assessment/Plan OSA  non-compliant with CPAP Morbid obesity Chronic hypoxic respiratory Failure New ankle swelling Plan Your oxygen saturation on room air at rest was 86%. This qualifies you for oxygen therapy. We will place an order for split night study since you have OSA to qualify you for your night time oxygen.  Try to remember to use your Symbicort  two times daily every day without fail.  Rinse mouth after use. Continue using your albuterol as rescue as needed.  Continue using Mucinex as you have been doing  Continue using Flonase as you have been doing.  Order for POC. Will refer to cardiology , I will let them order your heart echo.  Follow up in 1 months with Judson Roch NP or Laurie Dominguez.  Consider Pulmonary rehab  We will refer to healthy weight and wellness clinic.  Wear your oxygen at 3 L at bedtime  Wear oxygen during the day for sats > 88% at all times . This is a line in the sand. Sats may not drop below 88%. Please contact office for sooner follow up if symptoms do not improve or worsen or seek emergency care    I spent 45 minutes dedicated to the care of this patient on the date of this encounter to include pre-visit review of records, face-to-face time with the patient discussing conditions above, post visit ordering of testing, clinical documentation with the electronic health record, making appropriate referrals as documented, and communicating necessary information to the patient's healthcare team.     Magdalen Spatz, NP 12/05/2020  5:22 PM

## 2020-12-10 ENCOUNTER — Telehealth: Payer: Self-pay | Admitting: Pharmacist

## 2020-12-10 NOTE — Chronic Care Management (AMB) (Signed)
Chronic Care Management Pharmacy Assistant   Name: Laurie Dominguez  MRN: [id number] DOB: 12-04-1950   Reason for Encounter: Disease State /  Hypertension Assessment Call   Conditions to be addressed/monitored: HTN    Recent office visits:  11/08/2020 [name] NP - Patient was seen for Routine general medical exam and other issues. No medication changes. No follow up noted.  09/25/2020 [name] NP - Patient was seen for Tricompartment osteoarthritis of right knee. No medication changes. Referred to [facility]. No follow up noted.   Recent consult visits:  12/05/2020 Eric Form NP - Patient was seen for Chronic respiratory failure with hypoxia and additional issues. No medication issues. Referrals to Cardiology  and Internal Medicine for Split night study. No follow up noted.  [id number]  Tori Milks NP at [facility] - Patient was seen for Tricompartment osteoarthritis of right knee. No medication changes.  Follow up with [name] in 2 days.  Hospital visits:  None in previous 6 months  Medications: Outpatient Encounter Medications as of 12/10/2020  Medication Sig   albuterol (VENTOLIN HFA) 108 (90 Base) MCG/ACT inhaler USE 2 INHALATIONS BY MOUTH  EVERY 6 HOURS AS NEEDED FOR WHEEZING OR SHORTNESS OF  BREATH   amLODipine (NORVASC) 5 MG tablet TAKE 1 TABLET BY MOUTH  DAILY   budesonide-formoterol (SYMBICORT) 160-4.5 MCG/ACT inhaler Inhale 2 puffs into the lungs 2 (two) times daily. Need office visit for any additional refills (Patient not taking: Reported on 12/05/2020)   diazepam (VALIUM) 2 MG tablet Take 1 tablet (2 mg total) by mouth at bedtime as needed for anxiety or muscle spasms.   fluticasone (FLONASE) 50 MCG/ACT nasal spray Place 2 sprays into both nostrils daily.   furosemide (LASIX) 20 MG tablet TAKE 1 TABLET BY MOUTH  DAILY   hydrocortisone 2.5 % ointment APPLY TOPICALLY TWICE DAILY AS NEEDED   losartan-hydrochlorothiazide (HYZAAR)  100-25 MG tablet TAKE 1 TABLET BY MOUTH  DAILY   meclizine (ANTIVERT) 25 MG tablet Take 1 tablet (25 mg total) by mouth daily as needed.   naproxen (NAPROSYN) 500 MG tablet TAKE 1 TABLET BY MOUTH TWO  TIMES DAILY WITH MEALS (Patient taking differently: TAKE 1 TABLET BY MOUTH TWO  TIMES DAILY WITH MEALS)   omeprazole (PRILOSEC) 20 MG capsule TAKE 1 CAPSULE BY MOUTH  DAILY   PARoxetine (PAXIL) 30 MG tablet TAKE 2 TABLETS BY MOUTH IN  THE MORNING   rosuvastatin (CRESTOR) 5 MG tablet Take 1 tablet (5 mg total) by mouth every other day.   traMADol (ULTRAM) 50 MG tablet TAKE 1 TABLET BY MOUTH  EVERY 6 HOURS AS NEEDED FOR MODERATE PAIN OR SEVERE  PAIN   triamcinolone (KENALOG) 0.025 % cream Apply 1 application topically 2 (two) times daily.   No facility-administered encounter medications on file as of 12/10/2020.   Fill History:  SYMBICORT 160-4.5 MCG AER 08/24/2020 30   FUROSEMIDE  20 MG TABS 05/02/2020 90   LOSARTAN/HCTZ 100-'25MG'$  TAB 03/21/2020 90   OMEPRAZOLE  20 MG CPDR 04/27/2020 90   PAROXETINE HCL  30 MG TABS 04/19/2020 90   ROSUVASTATIN CALCIUM  5 MG TABS 04/27/2020 90   AMLODIPINE BESYLATE  5 MG TABS 05/17/2020 90   TRAMADOL HCL  50 MG TABS 04/27/2020 8   Reviewed chart prior to disease state call. Spoke with patient regarding BP  Recent Office Vitals: BP Readings from Last 3 Encounters:  12/05/20 122/74  11/08/20 102/60  09/25/20 122/80   Pulse  Readings from Last 3 Encounters:  12/05/20 78  11/08/20 87  09/25/20 72    Wt Readings from Last 3 Encounters:  12/05/20 286 lb 6.4 oz (129.9 kg)  01/11/20 281 lb 3.2 oz (127.6 kg)  12/07/19 280 lb 6.4 oz (127.2 kg)     Kidney Function Lab Results  Component Value Date/Time   CREATININE 0.70 11/08/2020 09:38 AM   CREATININE 0.74 11/08/2019 10:09 AM   CREATININE 0.65 10/26/2018 10:42 AM   GFR 87.00 11/08/2020 09:38 AM   GFRNONAA 82 11/08/2019 10:09 AM   GFRAA 95 11/08/2019 10:09 AM    BMP Latest Ref Rng & Units  11/08/2020 11/08/2019 10/26/2018  Glucose 70 - 99 mg/dL 128(H) 108(H) 104(H)  BUN 6 - 23 mg/dL '17 21 21  '$ Creatinine 0.40 - 1.20 mg/dL 0.70 0.74 0.65  BUN/Creat Ratio 6 - 22 (calc) - NOT APPLICABLE -  Sodium [id number] - 145 mEq/L 145 146 143  Potassium 3.5 - 5.1 mEq/L 3.7 3.7 3.8  Chloride 96 - 112 mEq/L 98 97(L) 98  CO2 19 - 32 mEq/L 40(H) 40(H) 37(H)  Calcium 8.4 - 10.5 mg/dL 9.2 9.4 9.3    Current antihypertensive regimen:  Amlodipine '5mg'$  1 tablet daily Losartan HCTZ 100/'25mg'$  1 tablet daily  How often are you checking your Blood Pressure?  She has not been checking them recently as she has been seeing a lot of doctors and her readings are always good.  Current home BP readings: No recent home readings.  What recent interventions/DTPs have been made by any provider to improve Blood Pressure control since last CPP Visit: None  Any recent hospitalizations or ED visits since last visit with CPP? No  What diet changes have been made to improve Blood Pressure Control?  No changes in her diet  What exercise is being done to improve your Blood Pressure Control?  Patient was swimming twice a week until her pool heater broke.   Adherence Review: Is the patient currently on ACE/ARB medication? Yes  Does the patient have >5 day gap between last estimated fill dates? No  Patient did not wish to review her medication list with me, she states there have not been any changes since her last visit with [name].   Care Gaps:  AWV - scheduled 11/27/2021 Covid 19 vaccine booster 4 - overdue 05/07/2020 Tetanus/TDAP - overdue 10/08/2020 Flu vaccine - due  Star Rating Drugs:  Losartan HCTZ 100/25 - last filled 11/24/2020 90DS at [facility] '5mg'$  - last filled 10/09/2020 90DS at [facility]   Total time: [place] [phone]

## 2020-12-10 NOTE — Chronic Care Management (AMB) (Signed)
    Chronic Care Management Pharmacy Assistant   Name: Laurie Dominguez  MRN: [id number] DOB: 19-Dec-1950  12/11/2020 APPOINTMENT REMINDER   Spoke with [name] she states she met with [name] and her appointment with Jeni Salles, Pharm. D, has been cancelled, they plan to schedule at a later date.   Care Gaps:  AWV - scheduled 11/27/2021 Covid 19 vaccine booster 4 - overdue 05/07/2020 Tetanus/TDAP - overdue 10/08/2020 Flu vaccine - due  Star Rating Drug:  Losartan HCTZ 100/25 - last filled 11/24/2020 90DS at [facility] Crestor 9m - last filled 10/09/2020 90DS at [facility]    Any gaps in medications fill history? No [facility] [facility] [place] [facility] [place] [facility] [name]

## 2020-12-11 ENCOUNTER — Telehealth: Payer: Medicare Other

## 2020-12-14 ENCOUNTER — Telehealth: Payer: Self-pay | Admitting: Pharmacist

## 2020-12-14 NOTE — Chronic Care Management (AMB) (Signed)
    Chronic Care Management Pharmacy Assistant   Name: Laurie Dominguez  MRN: [id number] DOB: 14-Jun-1950  Patient is due for CCP visit, appointment has been scheduled on 02/19/2021 at 1:45. Patient notified this will be a phone visit.  Medications: Outpatient Encounter Medications as of 12/14/2020  Medication Sig   albuterol (VENTOLIN HFA) 108 (90 Base) MCG/ACT inhaler USE 2 INHALATIONS BY MOUTH  EVERY 6 HOURS AS NEEDED FOR WHEEZING OR SHORTNESS OF  BREATH   amLODipine (NORVASC) 5 MG tablet TAKE 1 TABLET BY MOUTH  DAILY   budesonide-formoterol (SYMBICORT) 160-4.5 MCG/ACT inhaler Inhale 2 puffs into the lungs 2 (two) times daily. Need office visit for any additional refills (Patient not taking: Reported on 12/05/2020)   diazepam (VALIUM) 2 MG tablet Take 1 tablet (2 mg total) by mouth at bedtime as needed for anxiety or muscle spasms.   fluticasone (FLONASE) 50 MCG/ACT nasal spray Place 2 sprays into both nostrils daily.   furosemide (LASIX) 20 MG tablet TAKE 1 TABLET BY MOUTH  DAILY   hydrocortisone 2.5 % ointment APPLY TOPICALLY TWICE DAILY AS NEEDED   losartan-hydrochlorothiazide (HYZAAR) 100-25 MG tablet TAKE 1 TABLET BY MOUTH  DAILY   meclizine (ANTIVERT) 25 MG tablet Take 1 tablet (25 mg total) by mouth daily as needed.   naproxen (NAPROSYN) 500 MG tablet TAKE 1 TABLET BY MOUTH TWO  TIMES DAILY WITH MEALS (Patient taking differently: TAKE 1 TABLET BY MOUTH TWO  TIMES DAILY WITH MEALS)   omeprazole (PRILOSEC) 20 MG capsule TAKE 1 CAPSULE BY MOUTH  DAILY   PARoxetine (PAXIL) 30 MG tablet TAKE 2 TABLETS BY MOUTH IN  THE MORNING   rosuvastatin (CRESTOR) 5 MG tablet Take 1 tablet (5 mg total) by mouth every other day.   traMADol (ULTRAM) 50 MG tablet TAKE 1 TABLET BY MOUTH  EVERY 6 HOURS AS NEEDED FOR MODERATE PAIN OR SEVERE  PAIN   triamcinolone (KENALOG) 0.025 % cream Apply 1 application topically 2 (two) times daily.   No facility-administered encounter medications on file as of  12/14/2020.    Care Gaps:  AWV - scheduled 11/27/2021 Covid 19 vaccine booster 4 - overdue 05/07/2020 Tetanus/TDAP - overdue 10/08/2020 Flu vaccine - due  [place] [phone]

## 2020-12-18 ENCOUNTER — Other Ambulatory Visit: Payer: Self-pay

## 2020-12-18 MED ORDER — TRAMADOL HCL 50 MG PO TABS: ORAL_TABLET | ORAL | 0 refills | Status: DC

## 2020-12-18 NOTE — Telephone Encounter (Signed)
Pharmacy requesting refill for Ultram. Okay for refill? Stated for 7day supply for Post op pain

## 2021-01-08 ENCOUNTER — Other Ambulatory Visit: Payer: Self-pay | Admitting: Acute Care

## 2021-01-08 DIAGNOSIS — Z87891 Personal history of nicotine dependence: Secondary | ICD-10-CM

## 2021-01-09 ENCOUNTER — Ambulatory Visit: Payer: Medicare Other | Admitting: Acute Care

## 2021-01-09 ENCOUNTER — Encounter: Payer: Self-pay | Admitting: Acute Care

## 2021-01-09 ENCOUNTER — Other Ambulatory Visit: Payer: Self-pay

## 2021-01-09 DIAGNOSIS — Z87891 Personal history of nicotine dependence: Secondary | ICD-10-CM

## 2021-01-09 DIAGNOSIS — J449 Chronic obstructive pulmonary disease, unspecified: Secondary | ICD-10-CM

## 2021-01-09 DIAGNOSIS — E662 Morbid (severe) obesity with alveolar hypoventilation: Secondary | ICD-10-CM | POA: Diagnosis not present

## 2021-01-09 NOTE — Progress Notes (Signed)
History of Present Illness Laurie Dominguez is a 70 y.o. female former smoker ( Quit 2008 with an 80 pack year smoking history) with  COPD, OSA, not wearing CPAP, chronic hypoxic respiratory failure  on home oxygen. She is followed by Dr. Annamaria Boots.   01/09/2021 Follow up for chronic hypoxic/ hypercapneic respiratory failure. LaurieDominguez  qualified for oxygen at her last OV on 12/05/2020. She is wearing her oxygen at 3 L [place]. She states she feels better wearing her oxygen. ( She had not been wearing her oxygen prior to last OV, as she was trying to challenge her lungs) We discussed that this is a good step toward protecting her heart , lungs and brain. She had not been using her Symbicort at the last OV. She states she is now using this twice daily. She is using her rescue as needed. She has not had to use the rescue inhaler since she has been using her maintenance . She remains in a scooter. We referred her for Healthy weight and wellness at the last visit. She has not received a call from the office . We will re-refer her . Her decreased mobility  is caused by knee issues as well as her pulmonary issues. .  At her last OV we ordered a split night study and this is scheduled for 01/28/2021.We need to get her CPAP treatment restarted if this testing confirms OSA. .  We referred her to to cardiology and has an appointment with Dr. Marlou Porch 01/25/2021. I am concerned she may have an element of PAH. Her untreated OSA puts her at risk for this, and may be contributing to her dyspnea/ respiratory failure.  She states that she does wheeze. She states there is no position that makes this worse. It is just sporadic.  She is due for her lung cancer screening, this is scheduled for 01/24/2021. All elements of her work up are in process. We will follow up after she has seen cardiology, and after her split night study has been completed. .   Patient has requested  POA. She was walked on the POC ( Pulsed oxygen) in the  office today, and she has oxygen needs that exceed the POC delivery system.   Test Results: Oxygen Qualification 12/05/2020>> Saturations of 86% on RA.  Office Spirometry 06/12/2016-moderate restriction of exhaled volume, moderate to severe obstructive airways disease-FVC 1.80/63%, FEV1 1.29/59%, ratio 0.71, FEF 25-75% 0.87/45%. morbid obesity, obesity/hypoventilation syndrome Unattended Home Sleep Test 07/20/16-AHI 28.4/hour, desaturation to 42% with average saturation 75%, body weight 279 pounds Echo 2019: EF 65-70%, Wall thickness was increased in a pattern of mild LVH.grade 1 diastolic dysfunction). LA mildly dilated, systolic function was normal  CBC Latest Ref Rng & Units 11/08/2020 11/08/2019 10/26/2018  WBC 4.0 - 10.5 K/uL 6.3 7.2 7.7  Hemoglobin 12.0 - 15.0 g/dL 12.3 13.1 13.4  Hematocrit 36.0 - 46.0 % 38.1 41.2 40.7  Platelets 150.0 - 400.0 K/uL 264.0 314 333.0    BMP Latest Ref Rng & Units 11/08/2020 11/08/2019 10/26/2018  Glucose 70 - 99 mg/dL 128(H) 108(H) 104(H)  BUN 6 - 23 mg/dL 17 21 21   Creatinine 0.40 - 1.20 mg/dL 0.70 0.74 0.65  BUN/Creat Ratio 6 - 22 (calc) - NOT APPLICABLE -  Sodium 045 - 145 mEq/L 145 146 143  Potassium 3.5 - 5.1 mEq/L 3.7 3.7 3.8  Chloride 96 - 112 mEq/L 98 97(L) 98  CO2 19 - 32 mEq/L 40(H) 40(H) 37(H)  Calcium 8.4 - 10.5 mg/dL 9.2 9.4  9.3    BNP    Component Value Date/Time   BNP 20.4 04/04/2016 1805    ProBNP No results found for: PROBNP  PFT No results found for: FEV1PRE, FEV1POST, FVCPRE, FVCPOST, TLC, DLCOUNC, PREFEV1FVCRT, PSTFEV1FVCRT  No results found.   Past medical hx Past Medical History:  Diagnosis Date   Colon polyp 08/16/2004   Hyperplastic   Depression    GERD (gastroesophageal reflux disease)    Hypertension    Nephrolithiasis    Obesity      Social History   Tobacco Use   Smoking status: Former    Packs/day: 2.00    Years: 40.00    Pack years: 80.00    Types: Cigarettes    Quit date: 03/24/2006    Years since  quitting: 14.8   Smokeless tobacco: Never  Substance Use Topics   Alcohol use: No    Alcohol/week: 0.0 standard drinks   Drug use: No    LaurieDominguez reports that she quit smoking about 14 years ago. Her smoking use included cigarettes. She has a 80.00 pack-year smoking history. She has never used smokeless tobacco. She reports that she does not drink alcohol and does not use drugs.  Tobacco Cessation: LaurieDominguez reports that she quit smoking about 14 years ago. Her smoking use included cigarettes. She has a 80.00 pack-year smoking history. She has never used smokeless tobacco. She reports that she does not drink alcohol and does not use drugs.     Past surgical hx, Family hx, Social hx all reviewed.  Current Outpatient Medications on File Prior to Visit  Medication Sig   albuterol (VENTOLIN HFA) 108 (90 Base) MCG/ACT inhaler USE 2 INHALATIONS BY MOUTH  EVERY 6 HOURS AS NEEDED FOR WHEEZING OR SHORTNESS OF  BREATH   amLODipine (NORVASC) 5 MG tablet TAKE 1 TABLET BY MOUTH  DAILY   budesonide-formoterol (SYMBICORT) 160-4.5 MCG/ACT inhaler Inhale 2 puffs into the lungs 2 (two) times daily. Need office visit for any additional refills   diazepam (VALIUM) 2 MG tablet Take 1 tablet (2 mg total) by mouth at bedtime as needed for anxiety or muscle spasms.   furosemide (LASIX) 20 MG tablet TAKE 1 TABLET BY MOUTH  DAILY   hydrocortisone 2.5 % ointment APPLY TOPICALLY TWICE DAILY AS NEEDED   losartan-hydrochlorothiazide (HYZAAR) 100-25 MG tablet TAKE 1 TABLET BY MOUTH  DAILY   meclizine (ANTIVERT) 25 MG tablet Take 1 tablet (25 mg total) by mouth daily as needed.   naproxen (NAPROSYN) 500 MG tablet TAKE 1 TABLET BY MOUTH TWO  TIMES DAILY WITH MEALS (Patient taking differently: TAKE 1 TABLET BY MOUTH TWO  TIMES DAILY WITH MEALS)   omeprazole (PRILOSEC) 20 MG capsule TAKE 1 CAPSULE BY MOUTH  DAILY   PARoxetine (PAXIL) 30 MG tablet TAKE 2 TABLETS BY MOUTH IN  THE MORNING   rosuvastatin (CRESTOR) 5 MG tablet  Take 1 tablet (5 mg total) by mouth every other day.   traMADol (ULTRAM) 50 MG tablet TAKE 1 TABLET BY MOUTH  EVERY 6 HOURS AS NEEDED FOR MODERATE PAIN OR SEVERE  PAIN   triamcinolone (KENALOG) 0.025 % cream Apply 1 application topically 2 (two) times daily.   fluticasone (FLONASE) 50 MCG/ACT nasal spray Place 2 sprays into both nostrils daily.   No current facility-administered medications on file prior to visit.     Allergies  Allergen Reactions   Codeine Phosphate    Simvastatin     Myalgia     Review Of Systems:  Constitutional:  No  weight loss, night sweats,  Fevers, chills, + fatigue, or  lassitude.  HEENT:   No headaches,  Difficulty swallowing,  Tooth/dental problems, or  Sore throat,                No sneezing, itching, ear ache, nasal congestion, post nasal drip,   CV:  No chest pain,  Orthopnea, PND, swelling in lower extremities, anasarca, dizziness, palpitations, syncope.   GI  No heartburn, indigestion, abdominal pain, nausea, vomiting, diarrhea, change in bowel habits, loss of appetite, bloody stools.   Resp: + shortness of breath with exertion or at rest.  No excess mucus, no productive cough,  No non-productive cough,  No coughing up of blood.  No change in color of mucus.  No wheezing.  No chest wall deformity  Skin: no rash or lesions.  GU: no dysuria, change in color of urine, no urgency or frequency.  No flank pain, no hematuria   MS:  + joint pain ( knees) or swelling.  +decreased range of motion.  + back pain.  Psych:  No change in mood or affect. No depression or anxiety.  No memory loss.   Vital Signs BP 134/60 (BP Location: Left Arm, Patient Position: Sitting, Cuff Size: Large)   Pulse 99   Temp 98.1 F (36.7 C) (Oral)   Ht 5\' 2"  (1.575 m)   Wt 283 lb 6.4 oz (128.5 kg)   SpO2 90%   BMI 51.83 kg/m    Physical Exam:  General- No distress,  A&Ox3, pleasant ENT: No sinus tenderness, TM clear, pale nasal mucosa, no oral exudate,no post nasal  drip, no LAN Cardiac: S1, S2, regular rate and rhythm, no murmur Chest: No wheeze/ rales/ dullness; no accessory muscle use, no nasal flaring, no sternal retractions, diminished per bases Abd.: Soft Non-tender, ND, NT, Obese, Body mass index is 51.83 kg/m.  Ext: No clubbing cyanosis, + Bilateral lower extremity edema Neuro:  Physically deconditioned, MAE x 4, A&O x 3 Skin: No rashes, warm and dry, no lesions  Psych: normal mood and behavior   Assessment/Plan Obesity Hypoventilation Syndrome/ OSA Untreated Plan Weight loss through Healthy weight and wellness Split Night Study pending for 01/28/2021 Most likely will need CPAP/ BiPAP therapy  Chronic Hypoxic Respiratory Failure, oxygen dependent COPD Former smoker Followed through the lung cancer screening program.  Plan Continue Symbicort 2 puff in the morning and 2 puffs in the evening. Use rescue inhaler as needed.  Wear your oxygen at 3 L during the day and at bedtime  Wear oxygen during the day for sats > 88% at all times . Continue using Mucinex as you have been doing  Continue using Flonase as you have been doing.  Lung Cancer Screening scheduled for 01/24/2021   ? PAH on CT Imaging ( Stable dilated main pulmonary artery, suggesting chronic pulmonary arterial hypertension) Plan Referral to cardiology Cardiology OV scheduled for 01/28/2021 Appreciate cardiology evaluation  I spent 50 minutes dedicated to the care of this patient on the date of this encounter to include pre-visit review of records, face-to-face time with the patient discussing conditions above, post visit ordering of testing, clinical documentation with the electronic health record, making appropriate referrals as documented, and communicating necessary information to the patient's healthcare team.   Magdalen Spatz, NP 01/09/2021  11:01 PM

## 2021-01-09 NOTE — Patient Instructions (Addendum)
It is good to see you today. We will place an order for POC , we will place this on you today to make sure your oxygen levels are stable on pulsed air.  Follow up with cardiology 01/28/2021.as is scheduled.  Split night study scheduled for 01/28/2021. We will re-refer to healthy weight and wellness.  Continue Symbicort 2 puff in the morning and 2 puffs in the evening. Use rescue inhaler as needed.  Wear your oxygen at 3 L during the day and at bedtime  Wear oxygen during the day for sats > 88% at all times . Continue using Mucinex as you have been doing  Continue using Flonase as you have been doing.  Follow up with Judson Roch NP or Dr. Annamaria Boots in 1 month. Please contact office for sooner follow up if symptoms do not improve or worsen or seek emergency care

## 2021-01-10 ENCOUNTER — Telehealth: Payer: Self-pay | Admitting: Acute Care

## 2021-01-10 ENCOUNTER — Telehealth: Payer: Self-pay | Admitting: Adult Health

## 2021-01-10 NOTE — Telephone Encounter (Signed)
Pt called stating that [facility] has a 6 mth waiting list and wants to know if Judson Roch has any further recommendations?  Please advise.

## 2021-01-10 NOTE — Telephone Encounter (Signed)
Patient stated that the pharmacy sent in paperwork for her prescription for Naproxen to be refilled.   Patient would like to be contacted at [phone] to get an update on the process.  Please advise.

## 2021-01-11 NOTE — Telephone Encounter (Signed)
Called pt to advised that Rx has not been refilled since 2018. Pt did not answer the phone. Will route to [facility] for advise.

## 2021-01-11 NOTE — Telephone Encounter (Signed)
Lm for patient.  

## 2021-01-15 MED ORDER — NAPROXEN 500 MG PO TABS: ORAL_TABLET | ORAL | 0 refills | Status: DC

## 2021-01-15 NOTE — Addendum Note (Signed)
Addended by: Gwenyth Ober R on: 01/15/2021 09:20 AM   Modules accepted: Orders

## 2021-01-15 NOTE — Telephone Encounter (Signed)
Pt notified that of update pt preferred Rx sent to [facility].

## 2021-01-16 NOTE — Telephone Encounter (Signed)
Attempted to call pt. Unable to leave message. Will try to call back.

## 2021-01-18 NOTE — Telephone Encounter (Signed)
Spoke with pt and advised of info from Eric Form, NP. Pt verbalized understanding and states she will stay on the waiting list. Nothing further needed at this time.

## 2021-01-23 ENCOUNTER — Telehealth: Payer: Self-pay | Admitting: Pharmacist

## 2021-01-23 NOTE — Chronic Care Management (AMB) (Signed)
Chronic Care Management [facility]   Name: Laurie Dominguez  MRN: [id number] DOB: 1950/09/13   Reason for Encounter: Disease State / Hypertension Assessment Call   Conditions to be addressed/monitored: HTN  Recent office visits:  None  Recent consult visits:  01/09/2021 Laurie Form NP (pulmonary disease) - Patient was seen for Mobid obesity and additional issues. No medication changes. Follow up in 4 weeks.  Hospital visits:  None  Medications: Outpatient Encounter Medications as of 01/23/2021  Medication Sig   albuterol (VENTOLIN HFA) 108 (90 Base) MCG/ACT inhaler USE 2 INHALATIONS BY MOUTH  EVERY 6 HOURS AS NEEDED FOR WHEEZING OR SHORTNESS OF  BREATH   amLODipine (NORVASC) 5 MG tablet TAKE 1 TABLET BY MOUTH  DAILY   budesonide-formoterol (SYMBICORT) 160-4.5 MCG/ACT inhaler Inhale 2 puffs into the lungs 2 (two) times daily. Need office visit for any additional refills   diazepam (VALIUM) 2 MG tablet Take 1 tablet (2 mg total) by mouth at bedtime as needed for anxiety or muscle spasms.   fluticasone (FLONASE) 50 MCG/ACT nasal spray Place 2 sprays into both nostrils daily.   furosemide (LASIX) 20 MG tablet TAKE 1 TABLET BY MOUTH  DAILY   hydrocortisone 2.5 % ointment APPLY TOPICALLY TWICE DAILY AS NEEDED   losartan-hydrochlorothiazide (HYZAAR) 100-25 MG tablet TAKE 1 TABLET BY MOUTH  DAILY   meclizine (ANTIVERT) 25 MG tablet Take 1 tablet (25 mg total) by mouth daily as needed.   naproxen (NAPROSYN) 500 MG tablet TAKE 1 TABLET BY MOUTH TWO  TIMES DAILY WITH MEALS   omeprazole (PRILOSEC) 20 MG capsule TAKE 1 CAPSULE BY MOUTH  DAILY   PARoxetine (PAXIL) 30 MG tablet TAKE 2 TABLETS BY MOUTH IN  THE MORNING   rosuvastatin (CRESTOR) 5 MG tablet Take 1 tablet (5 mg total) by mouth every other day.   traMADol (ULTRAM) 50 MG tablet TAKE 1 TABLET BY MOUTH  EVERY 6 HOURS AS NEEDED FOR MODERATE PAIN OR SEVERE  PAIN   triamcinolone (KENALOG) 0.025 % cream Apply 1 application  topically 2 (two) times daily.   No facility-administered encounter medications on file as of 01/23/2021.   Fill History: FUROSEMIDE  20 MG TABS 05/02/2020 90   losartan-hydrochlorothiazide (HYZAAR) tablet 100-25 mg 09/22/2020 90   omeprazole (PRILOSEC) capsule 09/19/2020 90   amLODIpine (NORVASC) tablet 08/07/2020 90   Reviewed chart prior to disease state call. Spoke with patient regarding BP  Recent Office Vitals: BP Readings from Last 3 Encounters:  01/09/21 134/60  12/05/20 122/74  11/08/20 102/60   Pulse Readings from Last 3 Encounters:  01/09/21 99  12/05/20 78  11/08/20 87    Wt Readings from Last 3 Encounters:  01/09/21 283 lb 6.4 oz (128.5 kg)  12/05/20 286 lb 6.4 oz (129.9 kg)  01/11/20 281 lb 3.2 oz (127.6 kg)     Kidney Function Lab Results  Component Value Date/Time   CREATININE 0.70 11/08/2020 09:38 AM   CREATININE 0.74 11/08/2019 10:09 AM   CREATININE 0.65 10/26/2018 10:42 AM   GFR 87.00 11/08/2020 09:38 AM   GFRNONAA 82 11/08/2019 10:09 AM   GFRAA 95 11/08/2019 10:09 AM    BMP Latest Ref Rng & Units 11/08/2020 11/08/2019 10/26/2018  Glucose 70 - 99 mg/dL 128(H) 108(H) 104(H)  BUN 6 - 23 mg/dL 17 21 21   Creatinine 0.40 - 1.20 mg/dL 0.70 0.74 0.65  BUN/Creat Ratio 6 - 22 (calc) - NOT APPLICABLE -  Sodium 935 - 145 mEq/L 145 146 143  Potassium  3.5 - 5.1 mEq/L 3.7 3.7 3.8  Chloride 96 - 112 mEq/L 98 97(L) 98  CO2 19 - 32 mEq/L 40(H) 40(H) 37(H)  Calcium 8.4 - 10.5 mg/dL 9.2 9.4 9.3    Current antihypertensive regimen:  Amlodipine 5 mg - take 1 tablet daily Losartan-Hydrochlorothiazide 100/25 - take 1 tablet daily  How often are you checking your Blood Pressure? Patient is not checking her blood pressures at this time, she has lost her wrist cuff and doesn't want to purchase a new one yet, she wants to see if she can find her current blood pressure cuff.   Current home BP readings: None at this time, she states her readings have been good at all her  doctor visits.  What recent interventions/DTPs have been made by any provider to improve Blood Pressure control since last CPP Visit: None  Any recent hospitalizations or ED visits since last visit with CPP? No  What diet changes have been made to improve Blood Pressure Control?  No diet changes, she does try to eat healthy, meat, vegetables, citrus fruits, berries and nuts.  What exercise is being done to improve your Blood Pressure Control?  Patient is not swimming at this time, pool heater is not working, she is not real active due to right knee pain.  Adherence Review: Is the patient currently on ACE/ARB medication? Yes Does the patient have >5 day gap between last estimated fill dates? No  Note:  Patient states she is currently taking Rosuvastatin 5 mg every other day and working her way up to once daily. Time spent with patient on the phone was 35 min.  Care Gaps: AWV - scheduled 11/27/2021 Last BP - 134/60 on 01/09/2021 TDAP - overdue  Star Rating Drugs: Losartan HCTZ 100/25 mg - last filled 11/24/2020 90 DS at [facility] Rosuvastatin 5 mg - last filled 12/28/2020 90 DS at [facility] Verified with Laurie Dominguez [phone]

## 2021-01-24 ENCOUNTER — Other Ambulatory Visit: Payer: Self-pay

## 2021-01-24 ENCOUNTER — Ambulatory Visit
Admission: RE | Admit: 2021-01-24 | Discharge: 2021-01-24 | Disposition: A | Discharge status: Home / Self Care | Payer: Medicare Other | Source: Ambulatory Visit | Attending: Acute Care | Admitting: Acute Care

## 2021-01-24 DIAGNOSIS — Z87891 Personal history of nicotine dependence: Secondary | ICD-10-CM

## 2021-01-28 ENCOUNTER — Ambulatory Visit (HOSPITAL_BASED_OUTPATIENT_CLINIC_OR_DEPARTMENT_OTHER): Payer: Medicare Other | Attending: Acute Care | Admitting: Pulmonary Disease

## 2021-01-28 ENCOUNTER — Ambulatory Visit: Payer: Medicare Other | Admitting: Cardiology

## 2021-01-28 ENCOUNTER — Encounter: Payer: Self-pay | Admitting: Cardiology

## 2021-01-28 ENCOUNTER — Other Ambulatory Visit: Payer: Self-pay

## 2021-01-28 DIAGNOSIS — R0602 Shortness of breath: Secondary | ICD-10-CM | POA: Diagnosis not present

## 2021-01-28 DIAGNOSIS — E662 Morbid (severe) obesity with alveolar hypoventilation: Secondary | ICD-10-CM

## 2021-01-28 DIAGNOSIS — Z9981 Dependence on supplemental oxygen: Secondary | ICD-10-CM | POA: Diagnosis not present

## 2021-01-28 DIAGNOSIS — E7800 Pure hypercholesterolemia, unspecified: Secondary | ICD-10-CM

## 2021-01-28 DIAGNOSIS — I10 Essential (primary) hypertension: Secondary | ICD-10-CM | POA: Insufficient documentation

## 2021-01-28 DIAGNOSIS — G4736 Sleep related hypoventilation in conditions classified elsewhere: Secondary | ICD-10-CM | POA: Insufficient documentation

## 2021-01-28 DIAGNOSIS — G4733 Obstructive sleep apnea (adult) (pediatric): Secondary | ICD-10-CM | POA: Diagnosis present

## 2021-01-28 NOTE — Patient Instructions (Signed)
Medication Instructions:  The current medical regimen is effective;  continue present plan and medications.  *If you need a refill on your cardiac medications before your next appointment, please call your pharmacy*  Testing/Procedures: Your physician has requested that you have an echocardiogram. Echocardiography is a painless test that uses sound waves to create images of your heart. It provides your doctor with information about the size and shape of your heart and how well your heart's chambers and valves are working. This procedure takes approximately one hour. There are no restrictions for this procedure.  Follow-Up: At [facility], you and your health needs are our priority.  As part of our continuing mission to provide you with exceptional heart care, we have created designated Provider Care Teams.  These Care Teams include your primary Cardiologist (physician) and Advanced Practice Providers (APPs -  Physician Assistants and Nurse Practitioners) who all work together to provide you with the care you need, when you need it.  We recommend signing up for the patient portal called "MyChart".  Sign up information is provided on this After Visit Summary.  MyChart is used to connect with patients for Virtual Visits (Telemedicine).  Patients are able to view lab/test results, encounter notes, upcoming appointments, etc.  Non-urgent messages can be sent to your provider as well.   To learn more about what you can do with MyChart, go to [email or web address].    Your next appointment:   Follow up will be based on the results of the above testing.  Thank you for choosing [facility]!!     [employer] [address] [place], [place]

## 2021-01-28 NOTE — Progress Notes (Signed)
Cardiology Office Note:    Date:  01/28/2021   ID:  Laurie Dominguez, DOB Apr 16, 1950, MRN [id number]  PCP:  Laurie Peng, NP  Cardiologist:  Candee Furbish, MD    Referring MD: Magdalen Spatz, NP    History of Present Illness:    Laurie Dominguez is a 69 y.o. female with a hx of obesity and hypertension coming in today for evaluation of dependence on supplemental oxygen and hypoventilation at the request of Eric Form, NP.   She last saw Eric Form, NP on 01/09/21 for morbid obesity. She has been using oxygen for chronic respiratory failure. Her respiratory issues, knee pain, and weight limit her mobility. She was referred to cardiology for potential pulmonary arterial hypertension.  Today, she is doing fine and is accompanied by a family member. She presents today on supplemental oxygen and using an Transport planner.  She reports bilateral LE swelling. She has used compression socks in the past. However, the socks came up high on her legs and disrupted her circulation causing numbness. She has stopped wearing her compression socks and the numbness resolved.   She also reports frequency as a result of her medications.   She is trying to lose weight to receive a R knee arthroplasty to relieve her R knee pain  She has never smoked. Both her parents passed away due to cancer. She has used supplemental oxygen for about 5 years. However, in the past, she did not need to use the supplemental oxygen consistently like she does now.  She denies any palpitations or chest pain. No lightheadedness, headaches, syncope, orthopnea, PND, or exertional symptoms.   Past Medical History:  Diagnosis Date   Colon polyp 08/16/2004   Hyperplastic   Depression    GERD (gastroesophageal reflux disease)    Hypertension    Nephrolithiasis    Obesity     Past Surgical History:  Procedure Laterality Date   BREAST BIOPSY Left 12/2017   CHOLECYSTECTOMY     COLONOSCOPY  2006   DILATION AND  CURETTAGE OF UTERUS     MOHS SURGERY     TONSILLECTOMY      Current Medications: Current Meds  Medication Sig   albuterol (VENTOLIN HFA) 108 (90 Base) MCG/ACT inhaler USE 2 INHALATIONS BY MOUTH  EVERY 6 HOURS AS NEEDED FOR WHEEZING OR SHORTNESS OF  BREATH   amLODipine (NORVASC) 5 MG tablet TAKE 1 TABLET BY MOUTH  DAILY   budesonide-formoterol (SYMBICORT) 160-4.5 MCG/ACT inhaler Inhale 2 puffs into the lungs 2 (two) times daily. Need office visit for any additional refills   diazepam (VALIUM) 2 MG tablet Take 1 tablet (2 mg total) by mouth at bedtime as needed for anxiety or muscle spasms.   fluticasone (FLONASE) 50 MCG/ACT nasal spray Place 2 sprays into both nostrils daily.   furosemide (LASIX) 20 MG tablet TAKE 1 TABLET BY MOUTH  DAILY   hydrocortisone 2.5 % ointment APPLY TOPICALLY TWICE DAILY AS NEEDED   losartan-hydrochlorothiazide (HYZAAR) 100-25 MG tablet TAKE 1 TABLET BY MOUTH  DAILY   meclizine (ANTIVERT) 25 MG tablet Take 1 tablet (25 mg total) by mouth daily as needed.   naproxen (NAPROSYN) 500 MG tablet TAKE 1 TABLET BY MOUTH TWO  TIMES DAILY WITH MEALS   omeprazole (PRILOSEC) 20 MG capsule TAKE 1 CAPSULE BY MOUTH  DAILY   PARoxetine (PAXIL) 30 MG tablet TAKE 2 TABLETS BY MOUTH IN  THE MORNING   rosuvastatin (CRESTOR) 5 MG tablet Take 1 tablet (5 mg total)  by mouth every other day.   traMADol (ULTRAM) 50 MG tablet TAKE 1 TABLET BY MOUTH  EVERY 6 HOURS AS NEEDED FOR MODERATE PAIN OR SEVERE  PAIN   triamcinolone (KENALOG) 0.025 % cream Apply 1 application topically 2 (two) times daily.     Allergies:   Codeine phosphate and Simvastatin   Social History   Socioeconomic History   Marital status: Married    Spouse name: Not on file   Number of children: Not on file   Years of education: Not on file   Highest education level: Not on file  Occupational History   Not on file  Tobacco Use   Smoking status: Former    Packs/day: 2.00    Years: 40.00    Pack years: 80.00     Types: Cigarettes    Quit date: 03/24/2006    Years since quitting: 14.8   Smokeless tobacco: Never  Substance and Sexual Activity   Alcohol use: No    Alcohol/week: 0.0 standard drinks   Drug use: No   Sexual activity: Not on file  Other Topics Concern   Not on file  Social History Narrative   Not on file   Social Determinants of Health   Financial Resource Strain: Low Risk    Difficulty of Paying Living Expenses: Not hard at all  Food Insecurity: No Food Insecurity   Worried About Charity fundraiser in the Last Year: Never true   [place] in the Last Year: Never true  Transportation Needs: No Transportation Needs   Lack of Transportation (Medical): No   Lack of Transportation (Non-Medical): No  Physical Activity: Inactive   Days of Exercise per Week: 0 days   Minutes of Exercise per Session: 0 min  Stress: No Stress Concern Present   Feeling of Stress : Not at all  Social Connections: Moderately Integrated   Frequency of Communication with Friends and Family: More than three times a week   Frequency of Social Gatherings with Friends and Family: Once a week   Attends Religious Services: More than 4 times per year   Active Member of [employer] or Organizations: No   Attends Archivist Meetings: Never   Marital Status: Married     Family History: The patient's family history includes Cancer in her mother; Liver cancer in her mother; Other in her father. There is no history of Breast cancer.  ROS:   Please see the history of present illness.    (+) Bilateral LE swelling (+) Urinary frequency (+) Shortness of breath (+) R knee pain All other systems reviewed and negative.   EKGs/Labs/Other Studies Reviewed:    The following studies were reviewed today: Echo 04/05/16 - Left ventricle: The cavity size was normal. Wall thickness was    increased in a pattern of mild LVH. Systolic function was    vigorous. The estimated ejection fraction was in the range of  65%    to 70%. Wall motion was normal; there were no regional wall    motion abnormalities. Doppler parameters are consistent with    abnormal left ventricular relaxation (grade 1 diastolic    dysfunction).  - Aortic valve: Valve area (VTI): 3.05 cm^2. Valve area (Vmax):    2.58 cm^2. Valve area (Vmean): 2.63 cm^2.  - Left atrium: The atrium was mildly dilated.   EKG:  EKG was personally reviewed 01/28/21: Sinus rhythm, rate 78 bpm  Recent Labs: 11/08/2020: ALT 8; BUN 17; Creatinine, Ser 0.70; Hemoglobin  12.3; Platelets 264.0; Potassium 3.7; Sodium 145; TSH 4.11   Recent Lipid Panel    Component Value Date/Time   CHOL 162 11/08/2020 0938   TRIG 88.0 11/08/2020 0938   HDL 44.40 11/08/2020 0938   CHOLHDL 4 11/08/2020 0938   VLDL 17.6 11/08/2020 0938   LDLCALC 100 (H) 11/08/2020 0938   LDLCALC 143 (H) 11/08/2019 1009   LDLDIRECT 150.5 06/20/2008 0815    CHA2DS2-VASc Score =   [ ] .  Therefore, the patient's annual risk of stroke is   %.       Physical Exam:    VS:  BP 112/60 (BP Location: Left Arm, Patient Position: Sitting, Cuff Size: Large)   Pulse 78   Ht 5\' 2"  (1.575 m)   Wt 282 lb (127.9 kg)   SpO2 95%   BMI 51.58 kg/m     Wt Readings from Last 3 Encounters:  01/28/21 282 lb (127.9 kg)  01/09/21 283 lb 6.4 oz (128.5 kg)  12/05/20 286 lb 6.4 oz (129.9 kg)     GEN:  Well nourished, well developed in no acute distress HEENT: Normal NECK: No JVD; No carotid bruits LYMPHATICS: No lymphadenopathy CARDIAC: RRR, no murmurs, rubs, or gallops RESPIRATORY:  Clear to auscultation without rales, wheezing or rhonchi  ABDOMEN: Soft, non-tender, non-distended MUSCULOSKELETAL:  Chronic mild LE edema; No deformity  SKIN: Warm and dry NEUROLOGIC:  Alert and oriented x 3 PSYCHIATRIC:  Normal affect   ASSESSMENT:    1. Shortness of breath   2. Obesity hypoventilation syndrome (HCC)   3. Pure hypercholesterolemia    PLAN:   Shortness of breath We will check an  echocardiogram since it has been a few years to make sure that there have been no changes.  We are going to be looking particularly at the right ventricle to ensure proper structure and function.  I would expect some RV dilatation as well as some increase in pulmonary pressures as well.  Obesity hypoventilation syndrome (HCC) Wearing chronic oxygen.  Mobility with electric wheelchair.  She also has knee pain and is hoping at 1 point potentially to have a knee replacement if she is able to lose the weight.  Checking her overall structure and function of her heart with echocardiogram.  Pure hypercholesterolemia Excellent use of Crestor 5 mg every other day.  Protective.  Prior LDL 100 from outside labs.  Hemoglobin A1c also excellent at 5.8.  Hemoglobin 12.3 creatinine 0.7.  We will follow-up with results of studies.    Medication Adjustments/Labs and Tests Ordered: Current medicines are reviewed at length with the patient today.  Concerns regarding medicines are outlined above.  Orders Placed This Encounter  Procedures   EKG 12-Lead   ECHOCARDIOGRAM COMPLETE    No orders of the defined types were placed in this encounter.  I,Mykaella Javier,acting as a scribe for [employer], MD.,have documented all relevant documentation on the behalf of Candee Furbish, MD,as directed by  Candee Furbish, MD while in the presence of Candee Furbish, MD.  I, Candee Furbish, MD, have reviewed all documentation for this visit. The documentation on 01/28/21 for the exam, diagnosis, procedures, and orders are all accurate and complete.   Signed, Candee Furbish, MD  01/28/2021 11:59 AM    [place]

## 2021-01-28 NOTE — Assessment & Plan Note (Signed)
Excellent use of Crestor 5 mg every other day.  Protective.  Prior LDL 100 from outside labs.  Hemoglobin A1c also excellent at 5.8.  Hemoglobin 12.3 creatinine 0.7.

## 2021-01-28 NOTE — Assessment & Plan Note (Signed)
Wearing chronic oxygen.  Mobility with electric wheelchair.  She also has knee pain and is hoping at 1 point potentially to have a knee replacement if she is able to lose the weight.  Checking her overall structure and function of her heart with echocardiogram.

## 2021-01-28 NOTE — Assessment & Plan Note (Signed)
We will check an echocardiogram since it has been a few years to make sure that there have been no changes.  We are going to be looking particularly at the right ventricle to ensure proper structure and function.  I would expect some RV dilatation as well as some increase in pulmonary pressures as well.

## 2021-01-30 ENCOUNTER — Other Ambulatory Visit: Payer: Self-pay | Admitting: Acute Care

## 2021-01-30 DIAGNOSIS — Z87891 Personal history of nicotine dependence: Secondary | ICD-10-CM

## 2021-02-04 ENCOUNTER — Other Ambulatory Visit: Payer: Self-pay | Admitting: Adult Health

## 2021-02-04 DIAGNOSIS — Z9981 Dependence on supplemental oxygen: Secondary | ICD-10-CM

## 2021-02-04 NOTE — Procedures (Signed)
    Patient Name: Laurie Dominguez, Laurie Dominguez Date: 01/28/2021 Gender: Female D.O.B: [birth year] Age (years): 66 Referring Provider: Magdalen Spatz, NP Height (inches): 62 Interpreting Physician: Chesley Mires MD, ABSM Weight (lbs): 280 RPSGT: Carolin Coy BMI: 51 MRN: [id number] Neck Size: 18.00  CLINICAL INFORMATION Sleep Study Type: NPSG  Indication for sleep study: Hypertension, Obesity  Epworth Sleepiness Score: 7  Most recent polysomnogram dated 03/29/2017 revealed an AHI of 16.4/h.  SLEEP STUDY TECHNIQUE As per the AASM Manual for the Scoring of Sleep and Associated Events v2.3 (April 2016) with a hypopnea requiring 4% desaturations.  The channels recorded and monitored were frontal, central and occipital EEG, electrooculogram (EOG), submentalis EMG (chin), nasal and oral airflow, thoracic and abdominal wall motion, anterior tibialis EMG, snore microphone, electrocardiogram, and pulse oximetry.  MEDICATIONS Medications self-administered by patient taken the night of the study : N/A  SLEEP ARCHITECTURE The study was initiated at 11:06:51 PM and ended at 5:03:36 AM.  Sleep onset time was 27.9 minutes and the sleep efficiency was 77.9%%. The total sleep time was 278 minutes.  Stage REM latency was 290.5 minutes.  The patient spent 11.5%% of the night in stage N1 sleep, 87.6%% in stage N2 sleep, 0.0%% in stage N3 and 0.9% in REM.  Alpha intrusion was absent.  Supine sleep was 0.00%.  RESPIRATORY PARAMETERS The overall apnea/hypopnea index (AHI) was 0.6 per hour. There were 0 total apneas, including 0 obstructive, 0 central and 0 mixed apneas. There were 3 hypopneas and 55 RERAs.  Her respiratory disturbance index (RDI) was 12.3.  The AHI during Stage REM sleep was 0.0 per hour.  AHI while supine was N/A per hour.  The mean oxygen saturation was 94.8%. The minimum SpO2 during sleep was 89.0%.  She used 3 liters supplemental oxygen during this study.  moderate snoring  was noted during this study.  CARDIAC DATA The 2 lead EKG demonstrated sinus rhythm. The mean heart rate was 78.4 beats per minute. Other EKG findings include: PVCs.  LEG MOVEMENT DATA The total PLMS were 0 with a resulting PLMS index of 0.0. Associated arousal with leg movement index was 5.6 .  IMPRESSIONS - While she had several respiratory events, many of these were not associated with significant oxygen desaturation.  As such she did not meet criteria during this study for diagnosis of obstructive sleep apnea.  Her overall AHI was 0.6 with an SpO2 low of 89%.  Her overall RDI was 12.3. - She used 3 liters supplemental oxygen during this study. - The patient snored with moderate snoring volume. - EKG findings include PVCs. - Clinically significant periodic limb movements did not occur during sleep. Associated arousals were significant.  DIAGNOSIS - Nocturnal Hypoxemia (G47.36)  RECOMMENDATIONS - Continue 3 liters supplemental oxygen at night. - Avoid alcohol, sedatives and other CNS depressants that may worsen sleep apnea and disrupt normal sleep architecture. - Sleep hygiene should be reviewed to assess factors that may improve sleep quality. - Weight management and regular exercise should be initiated or continued if appropriate.  [Electronically signed] 02/04/2021 10:31 AM  Chesley Mires MD, ABSM Diplomate, American Board of Sleep Medicine   NPI: [id number] [place] PH: [phone]   FX: [phone] [place]

## 2021-02-06 ENCOUNTER — Ambulatory Visit: Payer: Medicare Other | Admitting: Acute Care

## 2021-02-06 ENCOUNTER — Telehealth: Payer: Self-pay | Admitting: Acute Care

## 2021-02-06 NOTE — Telephone Encounter (Signed)
I called and spoke with the pt and she stated that her tanks are not refilling so she has no oxygen to bring with her to her appt today.  She has called [facility] for the lat 3 days and they still have not shown up to fix this.  She stated that she can come without her oxygen---  She stated that she cannot do a video visit but she would be happy to either come to the visit or do a telephone visit.  SG please advise.  Her appt is at 330 today.

## 2021-02-06 NOTE — Telephone Encounter (Signed)
Per SG--  Please reschedule the pt --she cannot come to the office without oxygen.  I have called the pt and cancelled the appt for today and rsch her to 11/28 at 930.    I have called [facility] to see when they will be sending someone out to have her equipment checked so she is able to fill her tanks up. They have placed the order for urgent to have them go out and service her tanks.

## 2021-02-18 ENCOUNTER — Telehealth: Payer: Self-pay | Admitting: Pharmacist

## 2021-02-18 ENCOUNTER — Encounter: Payer: Self-pay | Admitting: Acute Care

## 2021-02-18 ENCOUNTER — Other Ambulatory Visit: Payer: Self-pay

## 2021-02-18 ENCOUNTER — Ambulatory Visit: Payer: Medicare Other | Admitting: Acute Care

## 2021-02-18 VITALS — BP 138/80 | HR 79 | Temp 98.4°F | Ht 63.0 in | Wt 284.0 lb

## 2021-02-18 DIAGNOSIS — J9611 Chronic respiratory failure with hypoxia: Secondary | ICD-10-CM | POA: Diagnosis not present

## 2021-02-18 DIAGNOSIS — I159 Secondary hypertension, unspecified: Secondary | ICD-10-CM | POA: Diagnosis not present

## 2021-02-18 DIAGNOSIS — G4734 Idiopathic sleep related nonobstructive alveolar hypoventilation: Secondary | ICD-10-CM | POA: Diagnosis not present

## 2021-02-18 NOTE — Patient Instructions (Addendum)
It is good to see you today. Your sleep study was negative for Obstructive sleep apnea.  Continue wearing oxygen at 3 L [place] during the day and at bedtime.  Remember oxygen saturations must always be > 88%. It is important to wear your oxygen at all times.  Follow up with cardiology regarding your BP medications.  Follow up in 6 months with Dr. Annamaria Boots or before as needed. Call if you need [place] sooner.   Please contact office for sooner follow up if symptoms do not improve or worsen or seek emergency care

## 2021-02-18 NOTE — Chronic Care Management (AMB) (Signed)
    Chronic Care Management Pharmacy Assistant   Name: Tarra Pence  MRN: [id number] DOB: 1951/03/18  02/19/2021 APPOINTMENT REMINDER  Laurie Dominguez was reminded to have all medications, supplements and any blood glucose and blood pressure readings available for review with Jeni Salles, Pharm. D, at her telephone visit on 02/19/2021 at 1:45. Unable to review questions with patient as she was on her way out the door to another appointment.  Care Gaps: AWV - scheduled 11/27/2021 Last BP - 134/60 on 01/09/2021 TDAP - overdue  Star Rating Drug: Losartan HCTZ 100/25 mg - last filled 11/24/2020 90 DS at [facility] Rosuvastatin 5 mg - last filled 12/28/2020 90 DS at [facility] Verified with Tan  Any gaps in medications fill history? No  [place]  Catering manager [phone]

## 2021-02-18 NOTE — Progress Notes (Signed)
History of Present Illness Laurie Dominguez is a 70 y.o. female  female former smoker ( Quit 2008 with an 80 pack year smoking history) with  COPD, OSA, not wearing CPAP, chronic hypoxic respiratory failure  on home oxygen. She is followed by Laurie Dominguez.   02/18/2021 Pt. Presents for follow up. She had her home sleep study done. This was negative for OSA. She is very relieved. She is wearing her night time oxygen at 3 L [place]. We again discussed the importance of wearing her oxygen to maintain oxygen sats at > 88% at all times. She states she has been more compliant with this.   She is otherwise doing well.She does have some lower extremity swelling. I have referred her to cardiology to evaluate this. She has appointments with cardiology.She has  an appointment with her pharmacist tomorrow to discuss medications.   She wants an imogen, she did not qualify when walked in the office ather last appointment . She requires more oxygen than the inogen can deliver .I have explained this to her. She is in an [employer] , and walks short distances at home. She has a bad knee that limits her mobility.  Test Results: Home Sleep Study Results IMPRESSIONS - While she had several respiratory events, many of these were not associated with significant oxygen desaturation.  As such she did not meet criteria during this study for diagnosis of obstructive sleep apnea.  Her overall AHI was 0.6 with an SpO2 low of 89%.  Her overall RDI was 12.3. - She used 3 liters supplemental oxygen during this study. - The patient snored with moderate snoring volume. - EKG findings include PVCs. - Clinically significant periodic limb movements did not occur during sleep. Associated arousals were significant.   DIAGNOSIS - Nocturnal Hypoxemia (G47.36)   RECOMMENDATIONS - Continue 3 liters supplemental oxygen at night. - Avoid alcohol, sedatives and other CNS depressants that may worsen sleep apnea and disrupt normal  sleep architecture. - Sleep hygiene should be reviewed to assess factors that may improve sleep quality. - Weight management and regular exercise should be initiated or continued if appropriate.    01/24/2021 CT Lung Cancer Screening  Lung-RADS 1, negative. Continue annual screening with low-dose chest CT without contrast in 12 months. 2. Liver may be cirrhotic. 3. Aortic atherosclerosis (ICD10-I70.0). Coronary artery calcification. 4. Enlarged pulmonic trunk, indicative of pulmonary arterial hypertension. 5.  Emphysema (ICD10-J43.9).  CBC Latest Ref Rng & Units 11/08/2020 11/08/2019 10/26/2018  WBC 4.0 - 10.5 K/uL 6.3 7.2 7.7  Hemoglobin 12.0 - 15.0 g/dL 12.3 13.1 13.4  Hematocrit 36.0 - 46.0 % 38.1 41.2 40.7  Platelets 150.0 - 400.0 K/uL 264.0 314 333.0    BMP Latest Ref Rng & Units 11/08/2020 11/08/2019 10/26/2018  Glucose 70 - 99 mg/dL 128(H) 108(H) 104(H)  BUN 6 - 23 mg/dL 17 21 21   Creatinine 0.40 - 1.20 mg/dL 0.70 0.74 0.65  BUN/Creat Ratio 6 - 22 (calc) - NOT APPLICABLE -  Sodium 353 - 145 mEq/L 145 146 143  Potassium 3.5 - 5.1 mEq/L 3.7 3.7 3.8  Chloride 96 - 112 mEq/L 98 97(L) 98  CO2 19 - 32 mEq/L 40(H) 40(H) 37(H)  Calcium 8.4 - 10.5 mg/dL 9.2 9.4 9.3    BNP    Component Value Date/Time   BNP 20.4 04/04/2016 1805    ProBNP No results found for: PROBNP  PFT No results found for: FEV1PRE, FEV1POST, FVCPRE, FVCPOST, TLC, DLCOUNC, PREFEV1FVCRT, PSTFEV1FVCRT  CT CHEST LUNG  CA SCREEN LOW DOSE W/O CM  Result Date: 01/25/2021 CLINICAL DATA:  Former smoker, quit 14 years ago, 5 pack-year history. EXAM: CT CHEST WITHOUT CONTRAST LOW-DOSE FOR LUNG CANCER SCREENING TECHNIQUE: Multidetector CT imaging of the chest was performed following the standard protocol without IV contrast. COMPARISON:  12/07/2019. FINDINGS: Cardiovascular: Atherosclerotic calcification of the aorta and coronary arteries. Pulmonic trunk and heart are enlarged. No pericardial effusion. Mediastinum/Nodes:  No pathologically enlarged mediastinal or axillary lymph nodes. Hilar regions are difficult to definitively evaluate without IV contrast. Esophagus is grossly unremarkable. Lungs/Pleura: Centrilobular emphysema. Scattered pulmonary parenchymal scarring. No suspicious pulmonary nodules. No pleural fluid. Airway is unremarkable. Upper Abdomen: Liver margin is slightly irregular with hypertrophy of the left hepatic lobe and caudate. Visualized portions of the adrenal glands, kidneys, spleen, pancreas, stomach and bowel are grossly unremarkable. Musculoskeletal: No worrisome lytic or sclerotic lesions. There may be an old posterior left rib fracture. IMPRESSION: 1. Lung-RADS 1, negative. Continue annual screening with low-dose chest CT without contrast in 12 months. 2. Liver may be cirrhotic. 3. Aortic atherosclerosis (ICD10-I70.0). Coronary artery calcification. 4. Enlarged pulmonic trunk, indicative of pulmonary arterial hypertension. 5.  Emphysema (ICD10-J43.9). Electronically Signed   By: Lorin Picket M.D.   On: 01/25/2021 11:48  SLEEP STUDY DOCUMENTS  Result Date: 01/30/2021 Ordered by an unspecified provider.    Past medical hx Past Medical History:  Diagnosis Date   Colon polyp 08/16/2004   Hyperplastic   Depression    GERD (gastroesophageal reflux disease)    Hypertension    Nephrolithiasis    Obesity      Social History   Tobacco Use   Smoking status: Former    Packs/day: 2.00    Years: 40.00    Pack years: 80.00    Types: Cigarettes    Quit date: 03/24/2006    Years since quitting: 14.9   Smokeless tobacco: Never  Substance Use Topics   Alcohol use: No    Alcohol/week: 0.0 standard drinks   Drug use: No    LaurieDominguez reports that she quit smoking about 14 years ago. Her smoking use included cigarettes. She has a 80.00 pack-year smoking history. She has never used smokeless tobacco. She reports that she does not drink alcohol and does not use drugs.  Tobacco  Cessation: Former smoker , Quit 2008 with a 80 pack year smoking history   Past surgical hx, Family hx, Social hx all reviewed.  Current Outpatient Medications on File Prior to Visit  Medication Sig   albuterol (VENTOLIN HFA) 108 (90 Base) MCG/ACT inhaler USE 2 INHALATIONS BY MOUTH  EVERY 6 HOURS AS NEEDED FOR WHEEZING OR SHORTNESS OF  BREATH   amLODipine (NORVASC) 5 MG tablet TAKE 1 TABLET BY MOUTH  DAILY   budesonide-formoterol (SYMBICORT) 160-4.5 MCG/ACT inhaler Inhale 2 puffs into the lungs 2 (two) times daily. Need office visit for any additional refills   diazepam (VALIUM) 2 MG tablet Take 1 tablet (2 mg total) by mouth at bedtime as needed for anxiety or muscle spasms.   furosemide (LASIX) 20 MG tablet TAKE 1 TABLET BY MOUTH  DAILY   hydrocortisone 2.5 % ointment APPLY TOPICALLY TWICE DAILY AS NEEDED   losartan-hydrochlorothiazide (HYZAAR) 100-25 MG tablet TAKE 1 TABLET BY MOUTH  DAILY   meclizine (ANTIVERT) 25 MG tablet Take 1 tablet (25 mg total) by mouth daily as needed.   naproxen (NAPROSYN) 500 MG tablet TAKE 1 TABLET BY MOUTH TWICE  DAILY WITH MEALS   omeprazole (PRILOSEC) 20 MG capsule  TAKE 1 CAPSULE BY MOUTH  DAILY   PARoxetine (PAXIL) 30 MG tablet TAKE 2 TABLETS BY MOUTH IN  THE MORNING   rosuvastatin (CRESTOR) 5 MG tablet Take 1 tablet (5 mg total) by mouth every other day.   traMADol (ULTRAM) 50 MG tablet TAKE 1 TABLET BY MOUTH  EVERY 6 HOURS AS NEEDED FOR MODERATE PAIN OR SEVERE  PAIN   triamcinolone (KENALOG) 0.025 % cream Apply 1 application topically 2 (two) times daily.   fluticasone (FLONASE) 50 MCG/ACT nasal spray Place 2 sprays into both nostrils daily.   No current facility-administered medications on file prior to visit.     Allergies  Allergen Reactions   Codeine Phosphate    Simvastatin     Myalgia     Review Of Systems:  Constitutional:   No  weight loss, night sweats,  Fevers, chills, fatigue, or  lassitude.  HEENT:   No headaches,  Difficulty  swallowing,  Tooth/dental problems, or  Sore throat,                No sneezing, itching, ear ache, nasal congestion, post nasal drip,   CV:  No chest pain,  Orthopnea, PND, swelling in lower extremities, anasarca, dizziness, palpitations, syncope.   GI  No heartburn, indigestion, abdominal pain, nausea, vomiting, diarrhea, change in bowel habits, loss of appetite, bloody stools.   Resp: + shortness of breath with exertion less at rest.  No excess mucus, no productive cough,  No non-productive cough,  No coughing up of blood.  No change in color of mucus.  Occasional  wheezing.  No chest wall deformity  Skin: no rash or lesions.  GU: no dysuria, change in color of urine, no urgency or frequency.  No flank pain, no hematuria   MS:  + joint pain or swelling.  + decreased range of motion.  No back pain.  Psych:  No change in mood or affect. No depression or anxiety.  No memory loss.   Vital Signs BP 138/80 (BP Location: Left Arm, Patient Position: Sitting, Cuff Size: Normal)   Pulse 79   Temp 98.4 F (36.9 C) (Oral)   Ht 5\' 3"  (1.6 m)   Wt 284 lb (128.8 kg)   SpO2 96%   BMI 50.31 kg/m    Physical Exam:  General- No distress,  A&O x 3. Pleasant  ENT: No sinus tenderness, TM clear, pale nasal mucosa, no oral exudate,no post nasal drip, no LAN Cardiac: S1, S2, regular rate and rhythm, no murmur Chest: No wheeze/ rales/ dullness; no accessory muscle use, no nasal flaring, no sternal retractions, diminished per bases Abd.: Soft Non-tender, ND, BS +, Body mass index is 50.31 kg/m. Ext: No clubbing cyanosis, edema Neuro:  normal strength, MAE x 4, A&O x 3 Skin: No rashes, warm and dry, No lesions Psych: normal mood and behavior   Assessment/Plan Home Sleep Study Negative for OSA + For nocturnal hypoxemia Plan - Wear oxygen at 3 L [place] at bedtime - Avoid alcohol, sedatives and other CNS depressants that may worsen sleep apnea and disrupt    normal sleep architecture. - Sleep  hygiene should be optimized - Work on weight loss, and increasing exercise as able. - Follow up with Laurie Dominguez in 6 months or sooner as needed   Chronic Hypoxic Respiratory Failure Does not Qualify for Inogen as the pulsed oxygen max does not meet her ambulatory needs.  Plan Remember oxygen saturations must always be > 88%. It is important to wear  your oxygen at all times.  Remember to monitor your oxygen levels with an oxygen saturation monitor.  Hypertension Worsening of Ankle swelling  Plan Per cardiology>> Currently taking Lasix 20 mg Daily  I spent 40 minutes dedicated to the care of this patient on the date of this encounter to include pre-visit review of records, face-to-face time with the patient discussing conditions above, post visit ordering of testing, clinical documentation with the electronic health record, making appropriate referrals as documented, and communicating necessary information to the patient's healthcare team.    Magdalen Spatz, NP 02/18/2021  9:39 AM

## 2021-02-19 ENCOUNTER — Ambulatory Visit (INDEPENDENT_AMBULATORY_CARE_PROVIDER_SITE_OTHER): Payer: Medicare Other | Admitting: Pharmacist

## 2021-02-19 ENCOUNTER — Encounter: Payer: Self-pay | Admitting: Acute Care

## 2021-02-19 DIAGNOSIS — J449 Chronic obstructive pulmonary disease, unspecified: Secondary | ICD-10-CM

## 2021-02-19 DIAGNOSIS — I10 Essential (primary) hypertension: Secondary | ICD-10-CM

## 2021-02-19 NOTE — Progress Notes (Signed)
Chronic Care Management Pharmacy Note  02/19/2021 Name:  Laurie Dominguez MRN:  [id number] DOB:  1950-11-29  Summary: BP is at goal < 140/90 Pt reports swelling has worsened   Recommendations/Changes made from today's visit: -Recommended use of Voltaren gel for arthritis pain to replace naproxen -Consider trial of decreased dose of amlodipine or holding due to fluid retention -Recommended Ozempic for weight loss/pre-diabetes   Plan: Follow up after PCP visit  Subjective: Laurie Dominguez is an 70 y.o. year old female who is a primary patient of [name], NP.  The CCM team was consulted for assistance with disease management and care coordination needs.    Engaged with patient face to face for initial visit in response to provider referral for pharmacy case management and/or care coordination services.   Consent to Services:  The patient was given information about Chronic Care Management services, agreed to services, and gave verbal consent prior to initiation of services.  Please see initial visit note for detailed documentation.   Patient Care Team: [name], NP as PCP - General (Family Medicine) Jerline Pain, MD as PCP - Cardiology (Cardiology) Griselda Miner, MD as Consulting Physician (Dermatology) Viona Gilmore, [facility] as Pharmacist (Pharmacist)  Recent office visits: 11/08/2020 [name] NP - Patient was seen for Routine general medical exam and other issues. No medication changes. No follow up noted.   09/25/2020 [name] NP - Patient was seen for Tricompartment osteoarthritis of right knee. No medication changes. Referred to [facility]. No follow up noted.  Recent consult visits: 02/18/21 Eric Form, NP (pulmonary): Patient presented for lung cancer screening program based on history of smoking.   01/28/21 Candee Furbish, MD (cardiology): Patient presented for SOB evaluation. Plan for Echo.  01/28/21 Patient presented for sleep  study.  01/09/2021 Eric Form NP (pulmonary disease) - Patient was seen for Mobid obesity and additional issues. No medication changes. Referred to healthy weight and wellness and cardiology. Follow up in 4 weeks.  12/05/2020 Eric Form NP - Patient was seen for Chronic respiratory failure with hypoxia and additional issues. No medication issues. Referrals to Cardiology  and Internal Medicine for Split night study. No follow up noted.  Hospital visits: 42/59/5638  Tori Milks NP at [facility] - Patient was seen for Tricompartment osteoarthritis of right knee. No medication changes.  Follow up with [name] in 2 days.  Objective:  Lab Results  Component Value Date   CREATININE 0.70 11/08/2020   BUN 17 11/08/2020   GFR 87.00 11/08/2020   GFRNONAA 82 11/08/2019   GFRAA 95 11/08/2019   NA 145 11/08/2020   K 3.7 11/08/2020   CALCIUM 9.2 11/08/2020   CO2 40 (H) 11/08/2020   GLUCOSE 128 (H) 11/08/2020    Lab Results  Component Value Date/Time   HGBA1C 5.8 11/08/2020 09:38 AM   HGBA1C 5.8 (H) 11/08/2019 10:09 AM   GFR 87.00 11/08/2020 09:38 AM   GFR 90.27 10/26/2018 10:42 AM    Last diabetic Eye exam: No results found for: HMDIABEYEEXA  Last diabetic Foot exam: No results found for: HMDIABFOOTEX   Lab Results  Component Value Date   CHOL 162 11/08/2020   HDL 44.40 11/08/2020   LDLCALC 100 (H) 11/08/2020   LDLDIRECT 150.5 06/20/2008   TRIG 88.0 11/08/2020   CHOLHDL 4 11/08/2020    Hepatic Function Latest Ref Rng & Units 11/08/2020 11/08/2019 10/26/2018  Total Protein 6.0 - 8.3 g/dL 6.8 6.5 6.9  Albumin 3.5 -  5.2 g/dL 3.8 - 4.1  AST 0 - 37 U/L 9 12 12   ALT 0 - 35 U/L 8 8 11   Alk Phosphatase 39 - 117 U/L 104 - 96  Total Bilirubin 0.2 - 1.2 mg/dL 0.4 0.5 0.4  Bilirubin, Direct 0.0 - 0.3 mg/dL - - -    Lab Results  Component Value Date/Time   TSH 4.11 11/08/2020 09:38 AM   TSH 3.11 11/08/2019 10:09 AM    CBC Latest Ref Rng & Units 11/08/2020  11/08/2019 10/26/2018  WBC 4.0 - 10.5 K/uL 6.3 7.2 7.7  Hemoglobin 12.0 - 15.0 g/dL 12.3 13.1 13.4  Hematocrit 36.0 - 46.0 % 38.1 41.2 40.7  Platelets 150.0 - 400.0 K/uL 264.0 314 333.0    No results found for: VD25OH  Clinical ASCVD: No  The 10-year ASCVD risk score (Arnett DK, et al., 2019) is: 28.2%   Values used to calculate the score:     Age: 6 years     Sex: Female     Is Non-Hispanic African American: No     Diabetic: Yes     Tobacco smoker: No     Systolic Blood Pressure: 735 mmHg     Is BP treated: Yes     HDL Cholesterol: 44.4 mg/dL     Total Cholesterol: 162 mg/dL    Depression screen [facility] 2/9 11/08/2020 11/07/2020 11/15/2019  Decreased Interest 0 0 0  Down, Depressed, Hopeless 0 0 0  PHQ - 2 Score 0 0 0  Altered sleeping 0 - 0  Tired, decreased energy 0 - 0  Change in appetite 0 - 0  Feeling bad or failure about yourself  0 - 0  Trouble concentrating 0 - 0  Moving slowly or fidgety/restless 0 - 0  Suicidal thoughts 0 - 0  PHQ-9 Score 0 - 0  Difficult doing work/chores Not difficult at all - Not difficult at all     No flowsheet data found.   CAT Score 01/09/2021  Total CAT Score 11     Social History   Tobacco Use  Smoking Status Former   Packs/day: 2.00   Years: 40.00   Pack years: 80.00   Types: Cigarettes   Quit date: 03/24/2006   Years since quitting: 14.9  Smokeless Tobacco Never   BP Readings from Last 3 Encounters:  02/18/21 138/80  01/28/21 112/60  01/09/21 134/60   Pulse Readings from Last 3 Encounters:  02/18/21 79  01/28/21 78  01/09/21 99   Wt Readings from Last 3 Encounters:  02/18/21 284 lb (128.8 kg)  01/28/21 280 lb (127 kg)  01/28/21 282 lb (127.9 kg)   BMI Readings from Last 3 Encounters:  02/18/21 50.31 kg/m  01/28/21 51.21 kg/m  01/28/21 51.58 kg/m    Assessment/Interventions: Review of patient past medical history, allergies, medications, health status, including review of consultants reports, laboratory and  other test data, was performed as part of comprehensive evaluation and provision of chronic care management services.   SDOH:  (Social Determinants of Health) assessments and interventions performed: No   CCM Care Plan  Allergies  Allergen Reactions   Codeine Phosphate    Simvastatin     Myalgia     Medications Reviewed Today     Reviewed by Magdalen Spatz, NP (Nurse Practitioner) on 02/19/21 at [place] [facility] Status: <None>   Medication Order Taking? Sig Documenting Provider Last Dose Status Informant  albuterol (VENTOLIN HFA) 108 (90 Base) MCG/ACT inhaler [id number] Yes USE 2 INHALATIONS  BY MOUTH  EVERY 6 HOURS AS NEEDED FOR WHEEZING OR SHORTNESS OF  BREATH Nafziger, Tommi Rumps, NP Taking Active   amLODipine (NORVASC) 5 MG tablet [id number] Yes TAKE 1 TABLET BY MOUTH  DAILY Nafziger, Tommi Rumps, NP Taking Active   budesonide-formoterol [facility]) 160-4.5 MCG/ACT inhaler [id number] Yes Inhale 2 puffs into the lungs 2 (two) times daily. Need office visit for any additional refills Magdalen Spatz, NP Taking Active   diazepam (VALIUM) 2 MG tablet [id number] Yes Take 1 tablet (2 mg total) by mouth at bedtime as needed for anxiety or muscle spasms. Nafziger, Tommi Rumps, NP Taking Active   fluticasone (FLONASE) 50 MCG/ACT nasal spray [id number]  Place 2 sprays into both nostrils daily. Laqueta Linden, MD  Expired 01/28/21 2359   furosemide (LASIX) 20 MG tablet [id number] Yes TAKE 1 TABLET BY MOUTH  DAILY Isaac Bliss, Rayford Halsted, MD Taking Active   hydrocortisone 2.5 % ointment [id number] Yes APPLY TOPICALLY TWICE DAILY AS NEEDED Nafziger, Tommi Rumps, NP Taking Active   losartan-hydrochlorothiazide (HYZAAR) 100-25 MG tablet [id number] Yes TAKE 1 TABLET BY MOUTH  DAILY Nafziger, Tommi Rumps, NP Taking Active   meclizine (ANTIVERT) 25 MG tablet [id number] Yes Take 1 tablet (25 mg total) by mouth daily as needed. Marletta Lor, MD Taking Active   naproxen (NAPROSYN) 500 MG tablet [id number] Yes TAKE 1 TABLET BY MOUTH  TWICE  DAILY WITH MEALS Nafziger, Tommi Rumps, NP Taking Active   omeprazole (PRILOSEC) 20 MG capsule [id number] Yes TAKE 1 CAPSULE BY MOUTH  DAILY Nafziger, Tommi Rumps, NP Taking Active   PARoxetine (PAXIL) 30 MG tablet [id number] Yes TAKE 2 TABLETS BY MOUTH IN  THE MORNING Nafziger, Tommi Rumps, NP Taking Active   rosuvastatin (CRESTOR) 5 MG tablet [id number] Yes Take 1 tablet (5 mg total) by mouth every other day. Nafziger, Tommi Rumps, NP Taking Active   traMADol (ULTRAM) 50 MG tablet [id number] Yes TAKE 1 TABLET BY MOUTH  EVERY 6 HOURS AS NEEDED FOR MODERATE PAIN OR SEVERE  PAIN Nafziger, Tommi Rumps, NP Taking Active   triamcinolone (KENALOG) 0.025 % cream [id number] Yes Apply 1 application topically 2 (two) times daily. 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 ([place]) 03/23/2017   Chronic respiratory failure with hypoxia ([place]) 02/04/2017   Psoriasis 09/26/2016   Obstructive sleep apnea 06/12/2016   COPD mixed type ([place]) 06/12/2016   Obesity hypoventilation syndrome ([place]) 04/24/2016   Hypoxia 04/05/2016   Acute respiratory failure with hypoxia ([place]) 04/05/2016   Impaired glucose tolerance 04/11/2014   Morbid obesity ([place]) 04/11/2014   Depression, recurrent ([place]) 12/21/2006   Essential hypertension 12/21/2006   GERD 12/21/2006   NEPHROLITHIASIS, HX OF 12/21/2006    Immunization History  Administered Date(s) Administered   Fluad Quad(high Dose 65+) 01/02/2021   Influenza Split 01/14/2012, 02/02/2013, 01/19/2017   Influenza Whole 01/05/1996   Influenza, High Dose Seasonal PF 02/12/2015, 12/06/2015, 12/29/2017, 12/22/2018, 12/22/2019   Influenza,inj,Quad PF,6+ Mos 01/12/2014    Influenza-Unspecified 12/27/2020   PFIZER(Purple Top)SARS-COV-2 Vaccination 05/16/2019, 06/06/2019, 01/05/2020   Pfizer Covid-19 Vaccine Bivalent Booster 4yr & up 12/06/2020   Pneumococcal Conjugate-13 09/26/2015, 12/22/2016   Pneumococcal Polysaccharide-23 09/28/2017, 12/22/2018   Tdap 10/09/2010  Zoster Recombinat (Shingrix) 09/28/2017, 12/29/2017   Patient has an appointment with her PCP tomorrow to address swelling and possibly repeating knee injection. Patient also has a band like feeling across her lower leg that is bothersome and she is going to discuss tomorrow.  Conditions to be addressed/monitored:  Hypertension, Hyperlipidemia, GERD, COPD, Depression and prediabetes, psoriasis and pain  Conditions addressed this visit: Prediabetes, hypertension, hyperlipidemia, swelling  Care Plan : CCM Pharmacy Care Plan  Updates made by Viona Gilmore, [place] since 02/19/2021 12:00 AM     Problem: Problem: Hypertension, Hyperlipidemia, GERD, COPD, Depression and prediabetes, psoriasis and pain      Long-Range Goal: Patient-Specific Goal   Start Date: 06/12/2020  Expected End Date: 06/12/2021  Recent Progress: On track  Priority: High  Note:   Current Barriers:  Unable to independently monitor therapeutic efficacy Suboptimal therapeutic regimen for swelling and hypertension  Pharmacist Clinical Goal(s):  Patient will achieve adherence to monitoring guidelines and medication adherence to achieve therapeutic efficacy adhere to plan to optimize therapeutic regimen for Symbicort as evidenced by report of adherence to recommended medication management changes through collaboration with PharmD and provider.   Interventions: 1:1 collaboration with [name], NP regarding development and update of comprehensive plan of care as evidenced by provider attestation and co-signature Inter-disciplinary care team collaboration (see longitudinal plan of care) Comprehensive medication review  performed; medication list updated in electronic medical record  Hypertension (BP goal <140/90) -Controlled -Current treatment: Amlodipine 5 mg 1 tablet daily Losartan-HCTZ 100-25 mg 1 tablet daily  -Medications previously tried: none  -Current home readings: 120/80 (checking M, W and F) - wrist cuff -Current dietary habits: brought out her bands  -Current exercise habits: husband limits her salt intake; using regular salt right now on accidents; not at all eating out  -Reports hypotensive/hypertensive symptoms -Educated on Exercise goal of 150 minutes per week; Importance of home blood pressure monitoring; Symptoms of hypotension and importance of maintaining adequate hydration; -Counseled to monitor BP at home at least weekly, document, and provide log at future appointments -Counseled on diet and exercise extensively Recommended to continue current medication Collaborated with PCP. Consider decreasing or holding amlodipine to see if this affects fluid retention.  Hyperlipidemia: (LDL goal < 100) -Not ideally controlled -Current treatment: Rosuvastatin 5 mg every other day -Medications previously tried: simvastatin (side effects)  -Current dietary patterns: not frying foods often; enjoys vegetables -Current exercise habits: not exercising -Educated on Cholesterol goals;  Importance of limiting foods high in cholesterol; Exercise goal of 150 minutes per week; -Counseled on diet and exercise extensively Recommended to continue current medication Educated on aortic atherosclerosis. Consider increasing statin therapy to target lower LDL.  Pre-diabetes (A1c goal <6.5%) -Controlled -Current medications: No medications -Medications previously tried: none  -Current home glucose readings fasting glucose: n/a post prandial glucose: n/a -Denies hypoglycemic/hyperglycemic symptoms -Current meal patterns:  breakfast: n/a  lunch: n/a  dinner: n/a snacks: n/a drinks: n/a -Current  exercise: does not exercise -Educated on A1c and blood sugar goals; Exercise goal of 150 minutes per week; Carbohydrate counting and/or plate method -Counseled to check feet daily and get yearly eye exams -Counseled on diet and exercise extensively Educated on GLP1 for weight loss.  COPD (Goal: control symptoms and prevent exacerbations) -Not ideally controlled -Current treatment  Symbicort 160-4.5 mcg/act 2 puffs twice daily Albuterol HFA 2 puffs every 6 hours as needed -Medications previously tried: none  -Gold Grade: Gold 2 (FEV1 50-79%) -Current COPD Classification:  B (high sx, <2  exacerbations/yr) -MMRC/CAT score: n/a -Pulmonary function testing: n/a -Exacerbations requiring treatment in last 6 months: none -Patient denies consistent use of maintenance inhaler -Frequency of rescue inhaler use: as needed -Counseled on Proper inhaler technique; Benefits of consistent maintenance inhaler use Differences between maintenance and rescue inhalers -Recommended to continue current medication Assessed patient finances. Patient may qualify for PAP.  Depression/Anxiety (Goal: minimize symptoms and prevent crying) -Controlled -Current treatment: Paroxetine 30 mg 2 tablets daily in the morning Diazepam 2 mg 1 tablet at bedtime as needed - not taking often -Medications previously tried/failed: none -PHQ9: 0 -GAD7: n/a -Educated on Benefits of medication for symptom control Benefits of cognitive-behavioral therapy with or without medication -Recommended to continue current medication  GERD (Goal: minimize symptoms of acid reflux) -Not ideally controlled -Current treatment  Omeprazole 20 mg 1 capsule daily -Medications previously tried: none -Counseled on non-pharmacologic management of symptoms such as elevating the head of your bed, avoiding eating 2-3 hours before bed, avoiding triggering foods such as acidic, spicy, or fatty foods, eating smaller meals, and wearing clothes that  are loose around the waist Recommended trying every other day to see if she truly needs it Patient was smoking and when she quit heartburn occurred less often  Edema (Goal: minimize ankle swelling) -Controlled -Current treatment  Furosemide 20 mg 1 tablet daily -Medications previously tried: none  -Educated on limiting salt intake and possibly off balancing the amlodipine side effect Collaborated with PCP about alternative options  Allergic rhinitis (Goal: minimize symptoms) -Controlled -Current treatment  Flonase 50 mcg/act 2 sprays in both nostrils daily as needed Mucinex 12 hour 1 tablet at bedtime  -Medications previously tried: none  -Recommended to continue current medication  Pain (Goal: minimize symptoms) -Controlled -Current treatment  Tramadol 50 mg 1 tablet every 6 hours as needed Naproxen 500 mg 1 tablet twice daily with meals as needed -Medications previously tried: n/a  -Counseled on increased risk for fluid retention with naproxen use. Recommended Voltaren gel (diclofenac gel) for arthritis.  Osteopenia (Goal prevent fractures) -Uncontrolled -Last DEXA Scan: 01/06/18   T-Score femoral neck: -2.2  T-Score total hip: n/a  T-Score lumbar spine: n/a  T-Score forearm radius: 0.7  10-year probability of major osteoporotic fracture: 10.6%  10-year probability of hip fracture: 2.0% -Patient is not a candidate for pharmacologic treatment -Current treatment  No medications -Medications previously tried: none  -Recommend [phone] units of vitamin D daily. Recommend 1200 mg of calcium daily from dietary and supplemental sources. Recommend weight-bearing and muscle strengthening exercises for building and maintaining bone density. -Recommended repeat DEXA.  Health Maintenance -Vaccine gaps: tetanus -Current therapy:  None -Educated on Cost vs benefit of each product must be carefully weighed by individual consumer -Patient is satisfied with current therapy and  denies issues -Counseled on getting recommended amounts of calcium and vitamin D daily  Patient Goals/Self-Care Activities Patient will:  - take medications as prescribed check blood pressure weekly, document, and provide at future appointments target a minimum of 150 minutes of moderate intensity exercise weekly engage in dietary modifications by increasing fiber intake  Follow Up Plan: Telephone follow up appointment with care management team member scheduled for: 3 months       Medication Assistance: None required.  Patient affirms current coverage meets needs.  Compliance/Adherence/Medication fill history: Care Gaps: Tetanus Last BP - 134/60 on 01/09/2021   Star-Rating Drugs: Losartan-HCTZ 100/25 mg - last filled 11/24/2020 90 DS at [facility] Rosuvastatin 5 mg - last filled 12/28/2020 90 DS at [facility]  Patient's preferred pharmacy is:  [place], [place] - [address] [place] [address] Phone: [phone] Fax: [phone]  [facility] ([place]) - [place], [place] [facility] [address] [place] [address] [place] [address] Phone: [phone] Fax: [phone]  [id number] [place], [facility] [address] [place] [address] [place] [address] Phone: [phone] Fax: [phone]  [facility] ([facility]) - [place], [name] [address] Phone: [phone] Fax: [phone]  Uses pill box? Yes Pt endorses 90% compliance - not taking Symbicort scheduled  We discussed: Current pharmacy is preferred with insurance plan and patient is satisfied with pharmacy services Patient decided to: Continue current medication management strategy  Care Plan and Follow Up Patient Decision:  Patient agrees to Care Plan and Follow-up.  Plan: The care  management team will reach out to the patient again over the next 30 days.  Jeni Salles, PharmD [facility] Clinical Pharmacist [place] at [place]

## 2021-02-20 DIAGNOSIS — J449 Chronic obstructive pulmonary disease, unspecified: Secondary | ICD-10-CM | POA: Diagnosis not present

## 2021-02-20 DIAGNOSIS — I10 Essential (primary) hypertension: Secondary | ICD-10-CM

## 2021-02-21 ENCOUNTER — Encounter: Payer: Self-pay | Admitting: Adult Health

## 2021-02-21 ENCOUNTER — Ambulatory Visit (INDEPENDENT_AMBULATORY_CARE_PROVIDER_SITE_OTHER): Payer: Medicare Other | Admitting: Adult Health

## 2021-02-21 VITALS — BP 130/66 | HR 95 | Temp 97.9°F | Ht 63.0 in | Wt 287.6 lb

## 2021-02-21 DIAGNOSIS — R7302 Impaired glucose tolerance (oral): Secondary | ICD-10-CM

## 2021-02-21 DIAGNOSIS — R600 Localized edema: Secondary | ICD-10-CM

## 2021-02-21 DIAGNOSIS — M65321 Trigger finger, right index finger: Secondary | ICD-10-CM

## 2021-02-21 MED ORDER — METHYLPREDNISOLONE ACETATE 40 MG/ML IJ SUSP
10.0000 mg | Freq: Once | INTRAMUSCULAR | Status: AC
  Administered 2021-02-21: 10 mg via INTRALESIONAL

## 2021-02-21 MED ORDER — OZEMPIC (0.25 OR 0.5 MG/DOSE) 2 MG/1.5ML ~~LOC~~ SOPN: 0.2500 mg | PEN_INJECTOR | SUBCUTANEOUS | 0 refills | Status: DC

## 2021-02-21 NOTE — Progress Notes (Signed)
Subjective:    Patient ID: Laurie Dominguez, female    DOB: 07/29/1950, 70 y.o.   MRN: [id number]  HPI 70 year old female who  has a past medical history of Colon polyp (08/16/2004), Depression, GERD (gastroesophageal reflux disease), Hypertension, Nephrolithiasis, and Obesity.  She presents to the office today for multiple issues.  This issue she would like to discuss is that of lower extremity edema.  This has been a chronic issue in the past and has been controlled to some degree with HCTZ 25 mg and Lasix 20 mg daily.  She has noticed more fluid retention as of lately.  Reports that she stopped taking her Norvasc and the swelling seemed to improve.  She is currently back on Norvasc.  And additionally, she talk to her clinical pharmacist recently.  She does have a history of glucose intolerance and obesity.  She is interested in trying Ozempic to see if this helps with bringing down her blood sugars as well as lowering her weight.  Thirdly, she has developed a trigger finger of her right index finger.  She feels a snapping and catching sensation when she flexes her finger.   Review of Systems See HPI   Past Medical History:  Diagnosis Date   Colon polyp 08/16/2004   Hyperplastic   Depression    GERD (gastroesophageal reflux disease)    Hypertension    Nephrolithiasis    Obesity     Social History   Socioeconomic History   Marital status: Married    Spouse name: Not on file   Number of children: Not on file   Years of education: Not on file   Highest education level: Not on file  Occupational History   Not on file  Tobacco Use   Smoking status: Former    Packs/day: 2.00    Years: 40.00    Pack years: 80.00    Types: Cigarettes    Quit date: 03/24/2006    Years since quitting: 14.9   Smokeless tobacco: Never  Substance and Sexual Activity   Alcohol use: No    Alcohol/week: 0.0 standard drinks   Drug use: No   Sexual activity: Not on file  Other Topics Concern    Not on file  Social History Narrative   Not on file   Social Determinants of Health   Financial Resource Strain: Low Risk    Difficulty of Paying Living Expenses: Not hard at all  Food Insecurity: No Food Insecurity   Worried About Charity fundraiser in the Last Year: Never true   [place] in the Last Year: Never true  Transportation Needs: No Transportation Needs   Lack of Transportation (Medical): No   Lack of Transportation (Non-Medical): No  Physical Activity: Inactive   Days of Exercise per Week: 0 days   Minutes of Exercise per Session: 0 min  Stress: No Stress Concern Present   Feeling of Stress : Not at all  Social Connections: Moderately Integrated   Frequency of Communication with Friends and Family: More than three times a week   Frequency of Social Gatherings with Friends and Family: Once a week   Attends Religious Services: More than 4 times per year   Active Member of [employer] or Organizations: No   Attends Archivist Meetings: Never   Marital Status: Married  Human resources officer Violence: Not At Risk   Fear of Current or Ex-Partner: No   Emotionally Abused: No   Physically Abused: No  Sexually Abused: No    Past Surgical History:  Procedure Laterality Date   BREAST BIOPSY Left 12/2017   CHOLECYSTECTOMY     COLONOSCOPY  2006   DILATION AND CURETTAGE OF UTERUS     MOHS SURGERY     TONSILLECTOMY      Family History  Problem Relation Age of Onset   Cancer Mother        unknown primary   Liver cancer Mother        unknown primary   Other Father        spinal cord tumor   Breast cancer Neg Hx     Allergies  Allergen Reactions   Codeine Phosphate    Simvastatin     Myalgia     Current Outpatient Medications on File Prior to Visit  Medication Sig Dispense Refill   albuterol (VENTOLIN HFA) 108 (90 Base) MCG/ACT inhaler USE 2 INHALATIONS BY MOUTH  EVERY 6 HOURS AS NEEDED FOR WHEEZING OR SHORTNESS OF  BREATH 25.5 g 3   amLODipine  (NORVASC) 5 MG tablet TAKE 1 TABLET BY MOUTH  DAILY 90 tablet 3   budesonide-formoterol (SYMBICORT) 160-4.5 MCG/ACT inhaler Inhale 2 puffs into the lungs 2 (two) times daily. Need office visit for any additional refills 30.6 each 0   diazepam (VALIUM) 2 MG tablet Take 1 tablet (2 mg total) by mouth at bedtime as needed for anxiety or muscle spasms. 30 tablet 0   furosemide (LASIX) 20 MG tablet TAKE 1 TABLET BY MOUTH  DAILY 90 tablet 3   hydrocortisone 2.5 % ointment APPLY TOPICALLY TWICE DAILY AS NEEDED 28.35 g 3   losartan-hydrochlorothiazide (HYZAAR) 100-25 MG tablet TAKE 1 TABLET BY MOUTH  DAILY 90 tablet 3   meclizine (ANTIVERT) 25 MG tablet Take 1 tablet (25 mg total) by mouth daily as needed. 90 tablet 1   naproxen (NAPROSYN) 500 MG tablet TAKE 1 TABLET BY MOUTH TWICE  DAILY WITH MEALS 60 tablet 11   omeprazole (PRILOSEC) 20 MG capsule TAKE 1 CAPSULE BY MOUTH  DAILY 90 capsule 3   PARoxetine (PAXIL) 30 MG tablet TAKE 2 TABLETS BY MOUTH IN  THE MORNING 180 tablet 3   rosuvastatin (CRESTOR) 5 MG tablet Take 1 tablet (5 mg total) by mouth every other day. 90 tablet 1   traMADol (ULTRAM) 50 MG tablet TAKE 1 TABLET BY MOUTH  EVERY 6 HOURS AS NEEDED FOR MODERATE PAIN OR SEVERE  PAIN 30 tablet 0   triamcinolone (KENALOG) 0.025 % cream Apply 1 application topically 2 (two) times daily.     fluticasone (FLONASE) 50 MCG/ACT nasal spray Place 2 sprays into both nostrils daily. 1 g 0   No current facility-administered medications on file prior to visit.    BP 130/66 (BP Location: Left Arm, Patient Position: Sitting, Cuff Size: Large)   Pulse 95   Temp 97.9 F (36.6 C) (Oral)   Ht 5\' 3"  (1.6 m)   Wt 287 lb 9.6 oz (130.5 kg)   SpO2 96%   BMI 50.95 kg/m       Objective:   Physical Exam Vitals and nursing note reviewed.  Constitutional:      Appearance: Normal appearance.  Cardiovascular:     Rate and Rhythm: Normal rate and regular rhythm.     Pulses: Normal pulses.     Heart sounds:  Normal heart sounds.  Pulmonary:     Effort: Pulmonary effort is normal.     Breath sounds: Normal breath sounds.  Abdominal:  General: Abdomen is flat. Bowel sounds are normal.     Palpations: Abdomen is soft.  Musculoskeletal:     Right lower leg: Edema present.     Left lower leg: Edema present.     Comments: Trigger finger of right index finger.  Catching and clicking noted.  Skin:    General: Skin is warm and dry.     Capillary Refill: Capillary refill takes less than 2 seconds.  Neurological:     General: No focal deficit present.     Mental Status: She is alert and oriented to person, place, and time.  Psychiatric:        Mood and Affect: Mood normal.        Behavior: Behavior normal.        Thought Content: Thought content normal.        Judgment: Judgment normal.      Assessment & Plan:  1. Impaired glucose tolerance -We will try and get Ozempic covered for her.  If it is covered she will follow-up in 1 month. - Semaglutide,0.25 or 0.5MG /DOS, (OZEMPIC, 0.25 OR 0.5 MG/DOSE,) 2 MG/1.5ML SOPN; Inject 0.25 mg into the skin once a week.  Dispense: 1.5 mL; Refill: 0  2. Lower extremity edema -Possibly due to Norvasc and or naproxen.  We will have her stop Norvasc first.  She will follow-up in 7 to 10 days.  3. Trigger finger, right index finger Verbal consent obtained for trigger finger injection.  Palpation of painful nodule on the palmar surface below the right trigger finger felt.  This area was cleaned with Betadine and alcohol.  Using cold spray for local anesthesia 31-gauge needle was injected into her palmar surface above the tendon sheath.  Patient had almost immediate relief and tolerated the procedure well   methylPREDNISolone acetate (DEPO-MEDROL) injection 10 mg  [name], NP

## 2021-02-21 NOTE — Patient Instructions (Addendum)
I am going to have you stop Norvasc to see if this helps with the swelling   I am also going to see if Ozempic is covered for you   I would like to see you back in 7-10 days

## 2021-02-22 ENCOUNTER — Other Ambulatory Visit: Payer: Self-pay

## 2021-02-22 ENCOUNTER — Ambulatory Visit (HOSPITAL_COMMUNITY): Payer: Medicare Other | Attending: Cardiology

## 2021-02-22 DIAGNOSIS — R0602 Shortness of breath: Secondary | ICD-10-CM | POA: Diagnosis not present

## 2021-02-22 LAB — ECHOCARDIOGRAM COMPLETE
Area-P 1/2: 3.48 cm2
S' Lateral: 3.2 cm

## 2021-02-22 MED ORDER — TRAMADOL HCL 50 MG PO TABS: ORAL_TABLET | ORAL | 2 refills | Status: DC

## 2021-02-26 ENCOUNTER — Other Ambulatory Visit: Payer: Self-pay | Admitting: Adult Health

## 2021-02-28 ENCOUNTER — Encounter: Payer: Self-pay | Admitting: Adult Health

## 2021-02-28 ENCOUNTER — Ambulatory Visit (INDEPENDENT_AMBULATORY_CARE_PROVIDER_SITE_OTHER): Payer: Medicare Other | Admitting: Adult Health

## 2021-02-28 VITALS — BP 132/80 | HR 91 | Temp 97.6°F | Ht 63.0 in | Wt 285.0 lb

## 2021-02-28 DIAGNOSIS — R600 Localized edema: Secondary | ICD-10-CM

## 2021-02-28 DIAGNOSIS — I10 Essential (primary) hypertension: Secondary | ICD-10-CM | POA: Diagnosis not present

## 2021-02-28 NOTE — Progress Notes (Signed)
Subjective:    Patient ID: Laurie Dominguez, female    DOB: 07-24-1950, 70 y.o.   MRN: [id number]  HPI  70 year old female who  has a past medical history of Colon polyp (08/16/2004), Depression, GERD (gastroesophageal reflux disease), Hypertension, Nephrolithiasis, and Obesity.  She presents to the office today for 1 week follow-up regarding lower extremity edema.  This is a chronic issue and the past has been controlled to some degree with HCTZ 25 mg and Lasix 20 mg daily.  She had noticed more fluid retention recently.  At home she had stopped taking her Norvasc for a few days and the swelling seemed to improve.  She went back on the Norvasc and the swelling increased.  We had her stop her Norvasc at this time with close follow-up in a week.  Today she reports that the swelling in her legs has improved since being off the Norvasc and she has not noticed any changes her blood pressure.   She was able to get Ozempic and will start this tomorrow     Review of Systems See HPI   Past Medical History:  Diagnosis Date   Colon polyp 08/16/2004   Hyperplastic   Depression    GERD (gastroesophageal reflux disease)    Hypertension    Nephrolithiasis    Obesity     Social History   Socioeconomic History   Marital status: Married    Spouse name: Not on file   Number of children: Not on file   Years of education: Not on file   Highest education level: Not on file  Occupational History   Not on file  Tobacco Use   Smoking status: Former    Packs/day: 2.00    Years: 40.00    Pack years: 80.00    Types: Cigarettes    Quit date: 03/24/2006    Years since quitting: 14.9   Smokeless tobacco: Never  Substance and Sexual Activity   Alcohol use: No    Alcohol/week: 0.0 standard drinks   Drug use: No   Sexual activity: Not on file  Other Topics Concern   Not on file  Social History Narrative   Not on file   Social Determinants of Health   Financial Resource Strain: Low Risk     Difficulty of Paying Living Expenses: Not hard at all  Food Insecurity: No Food Insecurity   Worried About Charity fundraiser in the Last Year: Never true   [place] in the Last Year: Never true  Transportation Needs: No Transportation Needs   Lack of Transportation (Medical): No   Lack of Transportation (Non-Medical): No  Physical Activity: Inactive   Days of Exercise per Week: 0 days   Minutes of Exercise per Session: 0 min  Stress: No Stress Concern Present   Feeling of Stress : Not at all  Social Connections: Moderately Integrated   Frequency of Communication with Friends and Family: More than three times a week   Frequency of Social Gatherings with Friends and Family: Once a week   Attends Religious Services: More than 4 times per year   Active Member of [employer] or Organizations: No   Attends Archivist Meetings: Never   Marital Status: Married  Human resources officer Violence: Not At Risk   Fear of Current or Ex-Partner: No   Emotionally Abused: No   Physically Abused: No   Sexually Abused: No    Past Surgical History:  Procedure Laterality Date  BREAST BIOPSY Left 12/2017   CHOLECYSTECTOMY     COLONOSCOPY  2006   DILATION AND CURETTAGE OF UTERUS     MOHS SURGERY     TONSILLECTOMY      Family History  Problem Relation Age of Onset   Cancer Mother        unknown primary   Liver cancer Mother        unknown primary   Other Father        spinal cord tumor   Breast cancer Neg Hx     Allergies  Allergen Reactions   Codeine Phosphate    Simvastatin     Myalgia     Current Outpatient Medications on File Prior to Visit  Medication Sig Dispense Refill   albuterol (VENTOLIN HFA) 108 (90 Base) MCG/ACT inhaler USE 2 INHALATIONS BY MOUTH  EVERY 6 HOURS AS NEEDED FOR WHEEZING OR SHORTNESS OF  BREATH 25.5 g 3   budesonide-formoterol (SYMBICORT) 160-4.5 MCG/ACT inhaler Inhale 2 puffs into the lungs 2 (two) times daily. Need office visit for any additional  refills 30.6 each 0   diazepam (VALIUM) 2 MG tablet Take 1 tablet (2 mg total) by mouth at bedtime as needed for anxiety or muscle spasms. 30 tablet 0   furosemide (LASIX) 20 MG tablet TAKE 1 TABLET BY MOUTH  DAILY 90 tablet 3   hydrocortisone 2.5 % ointment APPLY TOPICALLY TWICE DAILY AS NEEDED 28.35 g 3   losartan-hydrochlorothiazide (HYZAAR) 100-25 MG tablet TAKE 1 TABLET BY MOUTH  DAILY 90 tablet 3   meclizine (ANTIVERT) 25 MG tablet Take 1 tablet (25 mg total) by mouth daily as needed. 90 tablet 1   omeprazole (PRILOSEC) 20 MG capsule TAKE 1 CAPSULE BY MOUTH  DAILY 90 capsule 3   PARoxetine (PAXIL) 30 MG tablet TAKE 2 TABLETS BY MOUTH IN  THE MORNING 180 tablet 3   rosuvastatin (CRESTOR) 5 MG tablet Take 1 tablet (5 mg total) by mouth every other day. 90 tablet 1   Semaglutide,0.25 or 0.5MG /DOS, (OZEMPIC, 0.25 OR 0.5 MG/DOSE,) 2 MG/1.5ML SOPN Inject 0.25 mg into the skin once a week. 1.5 mL 0   traMADol (ULTRAM) 50 MG tablet TAKE 1 TABLET BY MOUTH  EVERY 6 HOURS AS NEEDED FOR MODERATE PAIN OR SEVERE  PAIN 30 tablet 2   triamcinolone (KENALOG) 0.025 % cream Apply 1 application topically 2 (two) times daily.     amLODipine (NORVASC) 5 MG tablet TAKE 1 TABLET BY MOUTH  DAILY (Patient not taking: Reported on 02/28/2021) 90 tablet 3   fluticasone (FLONASE) 50 MCG/ACT nasal spray Place 2 sprays into both nostrils daily. 1 g 0   naproxen (NAPROSYN) 500 MG tablet TAKE 1 TABLET BY MOUTH TWICE  DAILY WITH MEALS (Patient not taking: Reported on 02/28/2021) 60 tablet 11   No current facility-administered medications on file prior to visit.    BP 132/80   Pulse 91   Temp 97.6 F (36.4 C) (Oral)   Ht 5\' 3"  (1.6 m)   Wt 285 lb (129.3 kg)   SpO2 (!) 83%   BMI 50.49 kg/m      Objective:   Physical Exam Vitals and nursing note reviewed.  Constitutional:      Appearance: Normal appearance.  Cardiovascular:     Rate and Rhythm: Normal rate and regular rhythm.     Pulses: Normal pulses.     Heart  sounds: Normal heart sounds.  Musculoskeletal:        General: Normal range  of motion.     Right lower leg: Edema present.     Left lower leg: Edema present.  Skin:    General: Skin is warm and dry.  Neurological:     General: No focal deficit present.     Mental Status: She is alert and oriented to person, place, and time.  Psychiatric:        Mood and Affect: Mood normal.        Behavior: Behavior normal.        Thought Content: Thought content normal.      Assessment & Plan:   1. Lower extremity edema - Has improved. Will d/c norvasc completely   2. Essential hypertension - BP stable without norvasc - Advised to continue to monitor   [name], NP

## 2021-04-10 DIAGNOSIS — L570 Actinic keratosis: Secondary | ICD-10-CM | POA: Diagnosis not present

## 2021-04-10 DIAGNOSIS — L859 Epidermal thickening, unspecified: Secondary | ICD-10-CM | POA: Diagnosis not present

## 2021-04-10 DIAGNOSIS — B079 Viral wart, unspecified: Secondary | ICD-10-CM | POA: Diagnosis not present

## 2021-04-10 DIAGNOSIS — D485 Neoplasm of uncertain behavior of skin: Secondary | ICD-10-CM | POA: Diagnosis not present

## 2021-04-12 ENCOUNTER — Ambulatory Visit (INDEPENDENT_AMBULATORY_CARE_PROVIDER_SITE_OTHER): Payer: Medicare Other | Admitting: Adult Health

## 2021-04-12 ENCOUNTER — Encounter: Payer: Self-pay | Admitting: Adult Health

## 2021-04-12 VITALS — BP 136/82 | HR 87 | Temp 98.8°F | Ht 63.0 in | Wt 284.6 lb

## 2021-04-12 DIAGNOSIS — J0140 Acute pansinusitis, unspecified: Secondary | ICD-10-CM | POA: Diagnosis not present

## 2021-04-12 DIAGNOSIS — E785 Hyperlipidemia, unspecified: Secondary | ICD-10-CM | POA: Diagnosis not present

## 2021-04-12 DIAGNOSIS — R110 Nausea: Secondary | ICD-10-CM

## 2021-04-12 MED ORDER — ONDANSETRON HCL 4 MG PO TABS: 4.0000 mg | ORAL_TABLET | Freq: Three times a day (TID) | ORAL | 0 refills | Status: DC | PRN

## 2021-04-12 MED ORDER — DOXYCYCLINE HYCLATE 100 MG PO CAPS: 100.0000 mg | ORAL_CAPSULE | Freq: Two times a day (BID) | ORAL | 0 refills | Status: DC

## 2021-04-12 NOTE — Progress Notes (Signed)
Subjective:    Patient ID: Laurie Dominguez, female    DOB: [birth year], 71 y.o.   MRN: [id number]  HPI  71 year old female who  has a past medical history of Colon polyp (08/16/2004), Depression, GERD (gastroesophageal reflux disease), Hypertension, Nephrolithiasis, and Obesity.  She presents to the office today for an acute issue.  Her symptoms have been present for 3 months.  Symptoms include sinus pain and pressure, intermittent nausea, intermittent right sided headache, and feeling "hot".  Denies diaphoresis, chills, diarrhea  He thought it was Ozempic at first so stopped this for the first dose, but the symptoms continued.  BP Readings from Last 3 Encounters:  04/12/21 136/82  02/28/21 132/80  02/21/21 130/66   She would also like to come back and have her cholesterol panel checked. She has been taking her statin more frequently.   Review of Systems See HPI   Past Medical History:  Diagnosis Date   Colon polyp 08/16/2004   Hyperplastic   Depression    GERD (gastroesophageal reflux disease)    Hypertension    Nephrolithiasis    Obesity     Social History   Socioeconomic History   Marital status: Married    Spouse name: Not on file   Number of children: Not on file   Years of education: Not on file   Highest education level: Not on file  Occupational History   Not on file  Tobacco Use   Smoking status: Former    Packs/day: 2.00    Years: 40.00    Pack years: 80.00    Types: Cigarettes    Quit date: 03/24/2006    Years since quitting: 15.0   Smokeless tobacco: Never  Substance and Sexual Activity   Alcohol use: No    Alcohol/week: 0.0 standard drinks   Drug use: No   Sexual activity: Not on file  Other Topics Concern   Not on file  Social History Narrative   Not on file   Social Determinants of Health   Financial Resource Strain: Low Risk    Difficulty of Paying Living Expenses: Not hard at all  Food Insecurity: No Food Insecurity   Worried About  Charity fundraiser in the Last Year: Never true   [place] in the Last Year: Never true  Transportation Needs: No Transportation Needs   Lack of Transportation (Medical): No   Lack of Transportation (Non-Medical): No  Physical Activity: Inactive   Days of Exercise per Week: 0 days   Minutes of Exercise per Session: 0 min  Stress: No Stress Concern Present   Feeling of Stress : Not at all  Social Connections: Moderately Integrated   Frequency of Communication with Friends and Family: More than three times a week   Frequency of Social Gatherings with Friends and Family: Once a week   Attends Religious Services: More than 4 times per year   Active Member of [employer] or Organizations: No   Attends Archivist Meetings: Never   Marital Status: Married  Human resources officer Violence: Not At Risk   Fear of Current or Ex-Partner: No   Emotionally Abused: No   Physically Abused: No   Sexually Abused: No    Past Surgical History:  Procedure Laterality Date   BREAST BIOPSY Left 12/2017   CHOLECYSTECTOMY     COLONOSCOPY  2006   DILATION AND CURETTAGE OF UTERUS     MOHS SURGERY     TONSILLECTOMY  Family History  Problem Relation Age of Onset   Cancer Mother        unknown primary   Liver cancer Mother        unknown primary   Other Father        spinal cord tumor   Breast cancer Neg Hx     Allergies  Allergen Reactions   Codeine Phosphate    Simvastatin     Myalgia     Current Outpatient Medications on File Prior to Visit  Medication Sig Dispense Refill   albuterol (VENTOLIN HFA) 108 (90 Base) MCG/ACT inhaler USE 2 INHALATIONS BY MOUTH  EVERY 6 HOURS AS NEEDED FOR WHEEZING OR SHORTNESS OF  BREATH 25.5 g 3   budesonide-formoterol (SYMBICORT) 160-4.5 MCG/ACT inhaler Inhale 2 puffs into the lungs 2 (two) times daily. Need office visit for any additional refills 30.6 each 0   diazepam (VALIUM) 2 MG tablet Take 1 tablet (2 mg total) by mouth at bedtime as needed  for anxiety or muscle spasms. 30 tablet 0   furosemide (LASIX) 20 MG tablet TAKE 1 TABLET BY MOUTH  DAILY 90 tablet 3   hydrocortisone 2.5 % ointment APPLY TOPICALLY TWICE DAILY AS NEEDED 28.35 g 3   losartan-hydrochlorothiazide (HYZAAR) 100-25 MG tablet TAKE 1 TABLET BY MOUTH  DAILY 90 tablet 3   meclizine (ANTIVERT) 25 MG tablet Take 1 tablet (25 mg total) by mouth daily as needed. 90 tablet 1   omeprazole (PRILOSEC) 20 MG capsule TAKE 1 CAPSULE BY MOUTH  DAILY 90 capsule 3   PARoxetine (PAXIL) 30 MG tablet TAKE 2 TABLETS BY MOUTH IN  THE MORNING 180 tablet 3   rosuvastatin (CRESTOR) 5 MG tablet Take 1 tablet (5 mg total) by mouth every other day. 90 tablet 1   traMADol (ULTRAM) 50 MG tablet TAKE 1 TABLET BY MOUTH  EVERY 6 HOURS AS NEEDED FOR MODERATE PAIN OR SEVERE  PAIN 30 tablet 2   triamcinolone (KENALOG) 0.025 % cream Apply 1 application topically 2 (two) times daily.     fluticasone (FLONASE) 50 MCG/ACT nasal spray Place 2 sprays into both nostrils daily. 1 g 0   No current facility-administered medications on file prior to visit.    BP 136/82 (BP Location: Right Arm)    Pulse 87    Temp 98.8 F (37.1 C) (Oral)    Ht 5\' 3"  (1.6 m)    Wt 284 lb 9.6 oz (129.1 kg)    SpO2 97%    BMI 50.41 kg/m       Objective:   Physical Exam Constitutional:      Appearance: Normal appearance.  HENT:     Right Ear: Hearing, tympanic membrane, ear canal and external ear normal.     Left Ear: Hearing, tympanic membrane and external ear normal.     Nose: Congestion present.     Right Turbinates: Enlarged and swollen.     Left Turbinates: Enlarged and swollen.     Right Sinus: Maxillary sinus tenderness and frontal sinus tenderness present.     Left Sinus: Maxillary sinus tenderness and frontal sinus tenderness present.  Cardiovascular:     Rate and Rhythm: Normal rate and regular rhythm.     Pulses: Normal pulses.     Heart sounds: Normal heart sounds.  Pulmonary:     Effort: Pulmonary effort  is normal.     Breath sounds: Normal breath sounds.  Musculoskeletal:        General: Normal range of motion.  Skin:    General: Skin is warm and dry.     Capillary Refill: Capillary refill takes less than 2 seconds.  Neurological:     General: No focal deficit present.     Mental Status: She is alert and oriented to person, place, and time.  Psychiatric:        Mood and Affect: Mood normal.        Behavior: Behavior normal.        Thought Content: Thought content normal.        Judgment: Judgment normal.      Assessment & Plan:  1. Hyperlipidemia, unspecified hyperlipidemia type  - Lipid panel; Future  2. Acute non-recurrent pansinusitis  - doxycycline (VIBRAMYCIN) 100 MG capsule; Take 1 capsule (100 mg total) by mouth 2 (two) times daily.  Dispense: 14 capsule; Refill: 0  3. Nausea  - ondansetron (ZOFRAN) 4 MG tablet; Take 1 tablet (4 mg total) by mouth every 8 (eight) hours as needed for nausea or vomiting.  Dispense: 20 tablet; Refill: 0  [name], NP

## 2021-04-17 ENCOUNTER — Other Ambulatory Visit: Payer: Self-pay | Admitting: Adult Health

## 2021-04-17 ENCOUNTER — Telehealth: Payer: Self-pay | Admitting: Adult Health

## 2021-04-17 ENCOUNTER — Other Ambulatory Visit (INDEPENDENT_AMBULATORY_CARE_PROVIDER_SITE_OTHER): Payer: Medicare Other

## 2021-04-17 ENCOUNTER — Telehealth: Payer: Self-pay | Admitting: Pharmacist

## 2021-04-17 DIAGNOSIS — E785 Hyperlipidemia, unspecified: Secondary | ICD-10-CM | POA: Diagnosis not present

## 2021-04-17 LAB — LIPID PANEL
Cholesterol: 180 mg/dL (ref 0–200)
HDL: 45.5 mg/dL (ref >39.00)
LDL Cholesterol: 117 mg/dL — ABNORMAL HIGH (ref 0–99)
NonHDL: 134.54
Total CHOL/HDL Ratio: 4
Triglycerides: 90 mg/dL (ref 0.0–149.0)
VLDL: 18 mg/dL (ref 0.0–40.0)

## 2021-04-17 MED ORDER — AMOXICILLIN 500 MG PO TABS: 500.0000 mg | ORAL_TABLET | Freq: Two times a day (BID) | ORAL | 0 refills | Status: AC

## 2021-04-17 MED ORDER — TRIAMCINOLONE ACETONIDE 55 MCG/ACT NA AERO: 2.0000 | INHALATION_SPRAY | Freq: Every day | NASAL | 12 refills | Status: DC

## 2021-04-17 NOTE — Telephone Encounter (Signed)
Patient wrote a note to [facility]--placed in Dr's folder:  The Doxycycline Hyclate you prescribed for me gives me a terrible headache.  Could you give me another prescription.  Call me if you do.  I only took two of the pills.   [phone]  Please call script to [facility].

## 2021-04-17 NOTE — Chronic Care Management (AMB) (Signed)
Chronic Care Management Pharmacy Assistant   Name: Laurie Dominguez  MRN: [id number] DOB: 1950/05/16  Reason for Encounter: Disease State / Hypertension Assessment Call   Conditions to be addressed/monitored: HTN  Recent office visits:  04/12/2021 [name] NP - Patient was seen for Hyperlipidemia, unspecified hyperlipidemia type and additional issues. Started Doxycycline 100 mg twice daily and Ondansetron 4 mg every 8 hours as needed. No follow up noted.   02/28/2021 [name] NP - Patient was seen for lower extremity edema and an additional issue. Discontinued Amlodipine and Naproxen. No follow up noted.   02/21/2021 [name] NP - Patient was seen for Impaired glucose tolerance and additional issues. Started Semaglutide 0.25 mg  sq weekly. Follow up in 7-10 days.   Recent consult visits:  None  Hospital visits:  None  Medications: Outpatient Encounter Medications as of 04/17/2021  Medication Sig   albuterol (VENTOLIN HFA) 108 (90 Base) MCG/ACT inhaler USE 2 INHALATIONS BY MOUTH  EVERY 6 HOURS AS NEEDED FOR WHEEZING OR SHORTNESS OF  BREATH   amoxicillin (AMOXIL) 500 MG tablet Take 1 tablet (500 mg total) by mouth 2 (two) times daily for 10 days.   budesonide-formoterol (SYMBICORT) 160-4.5 MCG/ACT inhaler Inhale 2 puffs into the lungs 2 (two) times daily. Need office visit for any additional refills   diazepam (VALIUM) 2 MG tablet Take 1 tablet (2 mg total) by mouth at bedtime as needed for anxiety or muscle spasms.   fluticasone (FLONASE) 50 MCG/ACT nasal spray Place 2 sprays into both nostrils daily.   furosemide (LASIX) 20 MG tablet TAKE 1 TABLET BY MOUTH  DAILY   hydrocortisone 2.5 % ointment APPLY TOPICALLY TWICE DAILY AS NEEDED   losartan-hydrochlorothiazide (HYZAAR) 100-25 MG tablet TAKE 1 TABLET BY MOUTH  DAILY   meclizine (ANTIVERT) 25 MG tablet Take 1 tablet (25 mg total) by mouth daily as needed.   omeprazole (PRILOSEC) 20 MG capsule TAKE 1 CAPSULE  BY MOUTH  DAILY   ondansetron (ZOFRAN) 4 MG tablet Take 1 tablet (4 mg total) by mouth every 8 (eight) hours as needed for nausea or vomiting.   PARoxetine (PAXIL) 30 MG tablet TAKE 2 TABLETS BY MOUTH IN  THE MORNING   rosuvastatin (CRESTOR) 5 MG tablet Take 1 tablet (5 mg total) by mouth every other day.   traMADol (ULTRAM) 50 MG tablet TAKE 1 TABLET BY MOUTH  EVERY 6 HOURS AS NEEDED FOR MODERATE PAIN OR SEVERE  PAIN   triamcinolone (KENALOG) 0.025 % cream Apply 1 application topically 2 (two) times daily.   No facility-administered encounter medications on file as of 04/17/2021.  Fill History: ALBUTEROL SULFATE HFA  108 (90 Base) MCG/ACT AERS 03/06/2021 90   BETAMETH DIP 0.05%  CRE 04/10/2021 28   budesonide-formoterol (Symbicort) 160-4.5 mcg/actuation HFA aerosol inhaler 03/07/2021   FUROSEMIDE  20 MG TABS 04/04/2021 90   LOSARTAN POTASSIUM/HYDROCHLOROTHIAZ IDE 100-25 MG TABS 02/13/2021 90   MUPIROCIN 2%        OIN 04/10/2021 10   OMEPRAZOLE  20 MG CPDR 02/13/2021 90   PAROXETINE HCL  30 MG TABS 03/18/2021 90   ROSUVASTATIN CALCIUM  5 MG TABS 03/23/2021 90   OZEMPIC  2 MG/1.5ML SOPN 02/27/2021 56   AMLODIPINE BESYLATE  5 MG TABS 04/17/2021 90   Reviewed chart prior to disease state call. Spoke with patient regarding BP  Recent Office Vitals: BP Readings from Last 3 Encounters:  04/12/21 136/82  02/28/21 132/80  02/21/21 130/66  Pulse Readings from Last 3 Encounters:  04/12/21 87  02/28/21 91  02/21/21 95    Wt Readings from Last 3 Encounters:  04/12/21 284 lb 9.6 oz (129.1 kg)  02/28/21 285 lb (129.3 kg)  02/21/21 287 lb 9.6 oz (130.5 kg)     Kidney Function Lab Results  Component Value Date/Time   CREATININE 0.70 11/08/2020 09:38 AM   CREATININE 0.74 11/08/2019 10:09 AM   CREATININE 0.65 10/26/2018 10:42 AM   GFR 87.00 11/08/2020 09:38 AM   GFRNONAA 82 11/08/2019 10:09 AM   GFRAA 95 11/08/2019 10:09 AM    BMP Latest Ref Rng & Units 11/08/2020 11/08/2019  10/26/2018  Glucose 70 - 99 mg/dL 128(H) 108(H) 104(H)  BUN 6 - 23 mg/dL 17 21 21   Creatinine 0.40 - 1.20 mg/dL 0.70 0.74 0.65  BUN/Creat Ratio 6 - 22 (calc) - NOT APPLICABLE -  Sodium 062 - 145 mEq/L 145 146 143  Potassium 3.5 - 5.1 mEq/L 3.7 3.7 3.8  Chloride 96 - 112 mEq/L 98 97(L) 98  CO2 19 - 32 mEq/L 40(H) 40(H) 37(H)  Calcium 8.4 - 10.5 mg/dL 9.2 9.4 9.3    Current antihypertensive regimen:  Hyzaar 100/25 daily  How often are you checking your Blood Pressure? Patient is checking her blood pressures most every day.   Current home BP readings: Patients blood pressure is good always around 135/75.  What recent interventions/DTPs have been made by any provider to improve Blood Pressure control since last CPP Visit: No recent interventions.   Any recent hospitalizations or ED visits since last visit with CPP? No recent hospital visits.   What diet changes have been made to improve Blood Pressure Control?  Follows a low sodium diet Breakfast - Patient will have a variety, oatmeal, fruits (recently oranges from [place]), toast, bagels with egg, bacon and cream cheese.  Lunch - Patient doesn't eat every day, when she does she will have a salad or cottage cheese and fruit.  Dinner - Patient will have various meals but she will always contain a meat, vegetable or salad.   What exercise is being done to improve your Blood Pressure Control?  Patient will do housework and recently organizing closets.   Adherence Review: Is the patient currently on ACE/ARB medication? Yes Does the patient have >5 day gap between last estimated fill dates? No  Care Gaps: AWV - scheduled 11/27/2021 Last BP - 136/82 on 04/12/2021 Last A1C - 5.8 on 11/08/2020 TDAP - overdue  Star Rating Drugs: Losartan HCTZ 100/25 mg - last filled 02/13/2021 90 DS at [facility] Rosuvastatin 5 mg - last filled 03/23/2021 90 DS at [facility] Pharmacist Assistant [phone]

## 2021-04-17 NOTE — Telephone Encounter (Signed)
Called patient to discuss the below. Pt verbalized understanding

## 2021-04-29 DIAGNOSIS — H5711 Ocular pain, right eye: Secondary | ICD-10-CM | POA: Diagnosis not present

## 2021-04-29 DIAGNOSIS — R519 Headache, unspecified: Secondary | ICD-10-CM | POA: Diagnosis not present

## 2021-04-30 ENCOUNTER — Telehealth: Payer: Self-pay | Admitting: Acute Care

## 2021-04-30 NOTE — Telephone Encounter (Signed)
Lm for patient.  

## 2021-04-30 NOTE — Telephone Encounter (Signed)
Patient is returning phone call. Patient phone number is [phone].

## 2021-04-30 NOTE — Telephone Encounter (Signed)
Called patient but she did not answer. Left message for her to call back.  

## 2021-05-01 ENCOUNTER — Other Ambulatory Visit: Payer: Self-pay | Admitting: Adult Health

## 2021-05-15 ENCOUNTER — Ambulatory Visit (INDEPENDENT_AMBULATORY_CARE_PROVIDER_SITE_OTHER): Payer: Medicare Other | Admitting: Adult Health

## 2021-05-15 ENCOUNTER — Encounter: Payer: Self-pay | Admitting: Adult Health

## 2021-05-15 VITALS — BP 134/82 | HR 99 | Temp 98.0°F | Ht 63.0 in | Wt 281.0 lb

## 2021-05-15 DIAGNOSIS — I10 Essential (primary) hypertension: Secondary | ICD-10-CM

## 2021-05-15 DIAGNOSIS — M25561 Pain in right knee: Secondary | ICD-10-CM

## 2021-05-15 DIAGNOSIS — R519 Headache, unspecified: Secondary | ICD-10-CM

## 2021-05-15 DIAGNOSIS — G8929 Other chronic pain: Secondary | ICD-10-CM | POA: Diagnosis not present

## 2021-05-15 MED ORDER — METHYLPREDNISOLONE ACETATE 80 MG/ML IJ SUSP
80.0000 mg | Freq: Once | INTRAMUSCULAR | Status: AC
  Administered 2021-05-15: 80 mg via INTRA_ARTICULAR

## 2021-05-15 NOTE — Progress Notes (Signed)
Subjective:    Patient ID: Laurie Dominguez, female    DOB: [birth year], 71 y.o.   MRN: [id number]  HPI 71 year old female who presents to the office today for increased headaches over the last few months. She reports that since restarting her norvasc about a week ago ( was stopped a few months ago for concern of lower extremity edema) her headaches have improved dramatically. She has not noticed much lower extremity edema past baseline.   At home when she was checking her blood pressure she was getting readings in the 037 systolic but BP has started to come down back to baseline since restarting her Norvasc dose.   BP Readings from Last 3 Encounters:  05/15/21 134/82  04/12/21 136/82  02/28/21 132/80   Additionally, she would like to have a steroid injection into her right knee today d/t end stage osteoarthritis in her right knee  Her last steroid injection was in July 2022 and she did get significant relief with this.Pain started to get worse again over the last few months   Review of Systems See HPI   Past Medical History:  Diagnosis Date   Colon polyp 08/16/2004   Hyperplastic   Depression    GERD (gastroesophageal reflux disease)    Hypertension    Nephrolithiasis    Obesity     Social History   Socioeconomic History   Marital status: Married    Spouse name: Not on file   Number of children: Not on file   Years of education: Not on file   Highest education level: Not on file  Occupational History   Not on file  Tobacco Use   Smoking status: Former    Packs/day: 2.00    Years: 40.00    Pack years: 80.00    Types: Cigarettes    Quit date: 03/24/2006    Years since quitting: 15.1   Smokeless tobacco: Never  Substance and Sexual Activity   Alcohol use: No    Alcohol/week: 0.0 standard drinks   Drug use: No   Sexual activity: Not on file  Other Topics Concern   Not on file  Social History Narrative   Not on file   Social Determinants of Health   Financial  Resource Strain: Low Risk    Difficulty of Paying Living Expenses: Not hard at all  Food Insecurity: No Food Insecurity   Worried About Charity fundraiser in the Last Year: Never true   [place] in the Last Year: Never true  Transportation Needs: No Transportation Needs   Lack of Transportation (Medical): No   Lack of Transportation (Non-Medical): No  Physical Activity: Inactive   Days of Exercise per Week: 0 days   Minutes of Exercise per Session: 0 min  Stress: No Stress Concern Present   Feeling of Stress : Not at all  Social Connections: Moderately Integrated   Frequency of Communication with Friends and Family: More than three times a week   Frequency of Social Gatherings with Friends and Family: Once a week   Attends Religious Services: More than 4 times per year   Active Member of [employer] or Organizations: No   Attends Archivist Meetings: Never   Marital Status: Married  Human resources officer Violence: Not At Risk   Fear of Current or Ex-Partner: No   Emotionally Abused: No   Physically Abused: No   Sexually Abused: No    Past Surgical History:  Procedure Laterality Date  BREAST BIOPSY Left 12/2017   CHOLECYSTECTOMY     COLONOSCOPY  2006   DILATION AND CURETTAGE OF UTERUS     MOHS SURGERY     TONSILLECTOMY      Family History  Problem Relation Age of Onset   Cancer Mother        unknown primary   Liver cancer Mother        unknown primary   Other Father        spinal cord tumor   Breast cancer Neg Hx     Allergies  Allergen Reactions   Codeine Phosphate    Simvastatin     Myalgia     Current Outpatient Medications on File Prior to Visit  Medication Sig Dispense Refill   albuterol (VENTOLIN HFA) 108 (90 Base) MCG/ACT inhaler USE 2 INHALATIONS BY MOUTH  EVERY 6 HOURS AS NEEDED FOR WHEEZING OR SHORTNESS OF  BREATH 34 g 3   budesonide-formoterol (SYMBICORT) 160-4.5 MCG/ACT inhaler Inhale 2 puffs into the lungs 2 (two) times daily. Need office  visit for any additional refills 30.6 each 0   diazepam (VALIUM) 2 MG tablet Take 1 tablet (2 mg total) by mouth at bedtime as needed for anxiety or muscle spasms. 30 tablet 0   furosemide (LASIX) 20 MG tablet TAKE 1 TABLET BY MOUTH  DAILY 90 tablet 3   hydrocortisone 2.5 % ointment APPLY TOPICALLY TWICE DAILY AS NEEDED 28.35 g 3   losartan-hydrochlorothiazide (HYZAAR) 100-25 MG tablet TAKE 1 TABLET BY MOUTH  DAILY 90 tablet 3   meclizine (ANTIVERT) 25 MG tablet Take 1 tablet (25 mg total) by mouth daily as needed. 90 tablet 1   omeprazole (PRILOSEC) 20 MG capsule TAKE 1 CAPSULE BY MOUTH  DAILY 90 capsule 3   ondansetron (ZOFRAN) 4 MG tablet Take 1 tablet (4 mg total) by mouth every 8 (eight) hours as needed for nausea or vomiting. 20 tablet 0   PARoxetine (PAXIL) 30 MG tablet TAKE 2 TABLETS BY MOUTH IN  THE MORNING 180 tablet 3   rosuvastatin (CRESTOR) 5 MG tablet Take 1 tablet (5 mg total) by mouth every other day. 90 tablet 1   traMADol (ULTRAM) 50 MG tablet TAKE 1 TABLET BY MOUTH  EVERY 6 HOURS AS NEEDED FOR MODERATE PAIN OR SEVERE  PAIN 30 tablet 2   triamcinolone (KENALOG) 0.025 % cream Apply 1 application topically 2 (two) times daily.     triamcinolone (NASACORT) 55 MCG/ACT AERO nasal inhaler Place 2 sprays into the nose daily. 1 each 12   amLODipine (NORVASC) 5 MG tablet Take 5 mg by mouth daily.     No current facility-administered medications on file prior to visit.    BP 134/82    Pulse 99    Temp 98 F (36.7 C) (Oral)    Ht 5\' 3"  (1.6 m)    Wt 281 lb (127.5 kg)    SpO2 97%    BMI 49.78 kg/m       Objective:   Physical Exam Vitals and nursing note reviewed.  Constitutional:      Appearance: She is well-developed.  Pulmonary:     Effort: Pulmonary effort is normal.     Breath sounds: Normal breath sounds.  Musculoskeletal:        General: Tenderness present. No swelling.  Skin:    General: Skin is warm and dry.     Capillary Refill: Capillary refill takes less than 2  seconds.  Neurological:     General:  No focal deficit present.     Mental Status: She is alert and oriented to person, place, and time.     Gait: Gait abnormal (uses motorized wheelchair).  Psychiatric:        Mood and Affect: Mood normal.        Behavior: Behavior normal.        Thought Content: Thought content normal.        Judgment: Judgment normal.      Assessment & Plan:  1. Increased frequency of headaches - Resolving since restarting Norvasc  Will hold off on further workup at this time   2. Chronic pain of right knee Discussed risks and benefits of corticosteroid injection and patient consented.  After prepping skin with betadine, injected 80 mg depomedrol and 2 cc of plain xylocaine with 22 gauge one and one half inch needle using anterolateral approach and pt tolerated well.  - methylPREDNISolone acetate (DEPO-MEDROL) injection 80 mg  3. Essential hypertension - Would like to stay on current therapy. Advised if swelling in legs increases then let me know and we can change her BP medication     [name], NP

## 2021-05-17 ENCOUNTER — Other Ambulatory Visit: Payer: Self-pay | Admitting: Adult Health

## 2021-06-04 DIAGNOSIS — H43813 Vitreous degeneration, bilateral: Secondary | ICD-10-CM | POA: Diagnosis not present

## 2021-06-04 DIAGNOSIS — H25811 Combined forms of age-related cataract, right eye: Secondary | ICD-10-CM | POA: Diagnosis not present

## 2021-06-04 DIAGNOSIS — H5213 Myopia, bilateral: Secondary | ICD-10-CM | POA: Diagnosis not present

## 2021-07-02 ENCOUNTER — Encounter: Payer: Self-pay | Admitting: *Deleted

## 2021-07-02 NOTE — Telephone Encounter (Signed)
Attempted to call pt but unable to reach. Left message for her to return call. Due to multiple attempts trying to call pt without being able to reach, per protocol encounter will be closed and letter will also be sent to pt. ?

## 2021-07-04 MED ORDER — TRIAMCINOLONE ACETONIDE 55 MCG/ACT NA AERO
2.0000 | INHALATION_SPRAY | Freq: Every day | NASAL | 3 refills | Status: DC
Start: 2021-07-04 — End: 2021-12-25

## 2021-07-04 NOTE — Telephone Encounter (Signed)
Called and spoke with pt to clarify which nasal spray she was needing to have refilled and have sent Rx of nasal spray to preferred pharmacy. When asking pt what questions she had about mucinex, pt said she did not have any questions. Nothing further needed. ?

## 2021-07-04 NOTE — Addendum Note (Signed)
Addended by: Lorretta Harp on: 07/04/2021 03:34 PM ? ? Modules accepted: Orders ? ?

## 2021-07-19 ENCOUNTER — Other Ambulatory Visit: Payer: Self-pay | Admitting: Adult Health

## 2021-07-19 DIAGNOSIS — Z1231 Encounter for screening mammogram for malignant neoplasm of breast: Secondary | ICD-10-CM

## 2021-07-24 ENCOUNTER — Ambulatory Visit: Payer: Medicare Other | Admitting: Adult Health

## 2021-07-25 ENCOUNTER — Ambulatory Visit
Admission: RE | Admit: 2021-07-25 | Discharge: 2021-07-25 | Disposition: A | Discharge status: Home / Self Care | Payer: Medicare Other | Source: Ambulatory Visit | Attending: Adult Health | Admitting: Adult Health

## 2021-07-25 DIAGNOSIS — Z1231 Encounter for screening mammogram for malignant neoplasm of breast: Secondary | ICD-10-CM | POA: Diagnosis not present

## 2021-08-05 DIAGNOSIS — Z0289 Encounter for other administrative examinations: Secondary | ICD-10-CM

## 2021-08-18 IMAGING — MG DIGITAL SCREENING BILAT W/ TOMO W/ CAD
8 series · 8 of 24 positions shown · non-contrast
Comparison: Previous exam(s).

CLINICAL DATA: Screening.

EXAM:
DIGITAL SCREENING BILATERAL MAMMOGRAM WITH TOMO AND CAD

[R CC synth-2D]
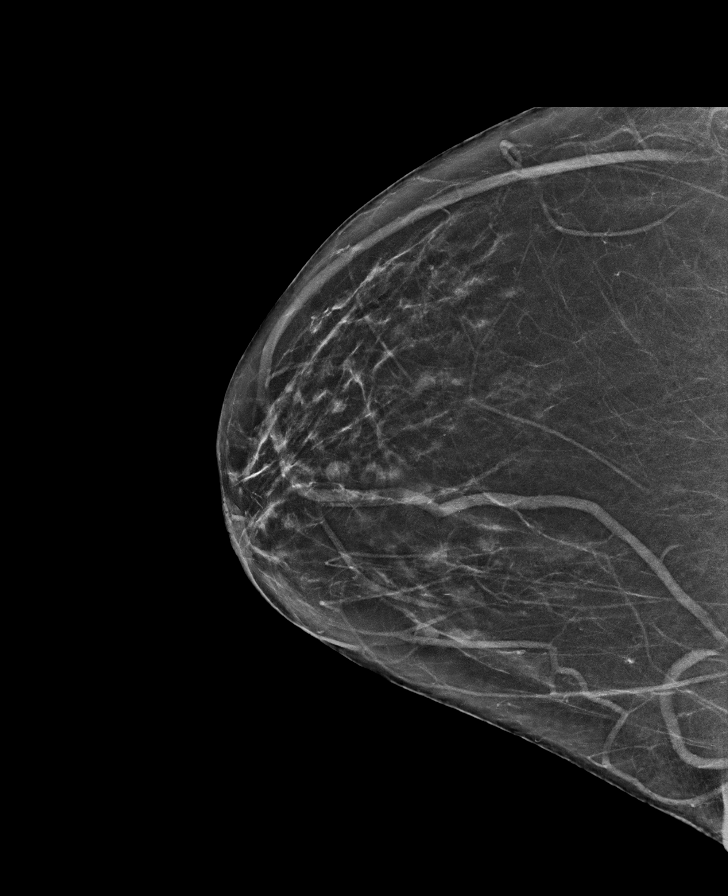

[L MLO synth-2D]
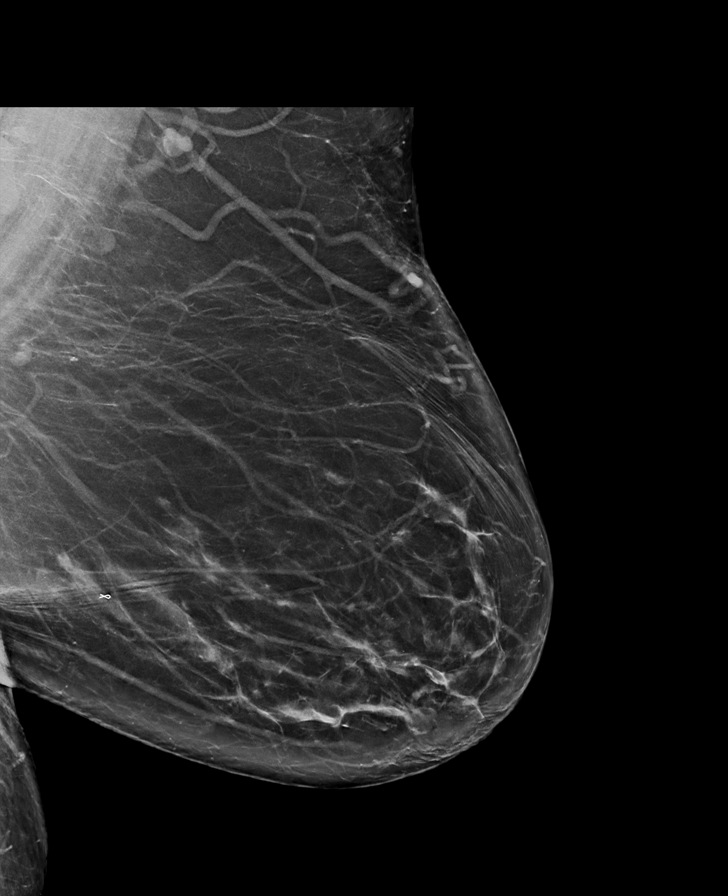

[L CC synth-2D]
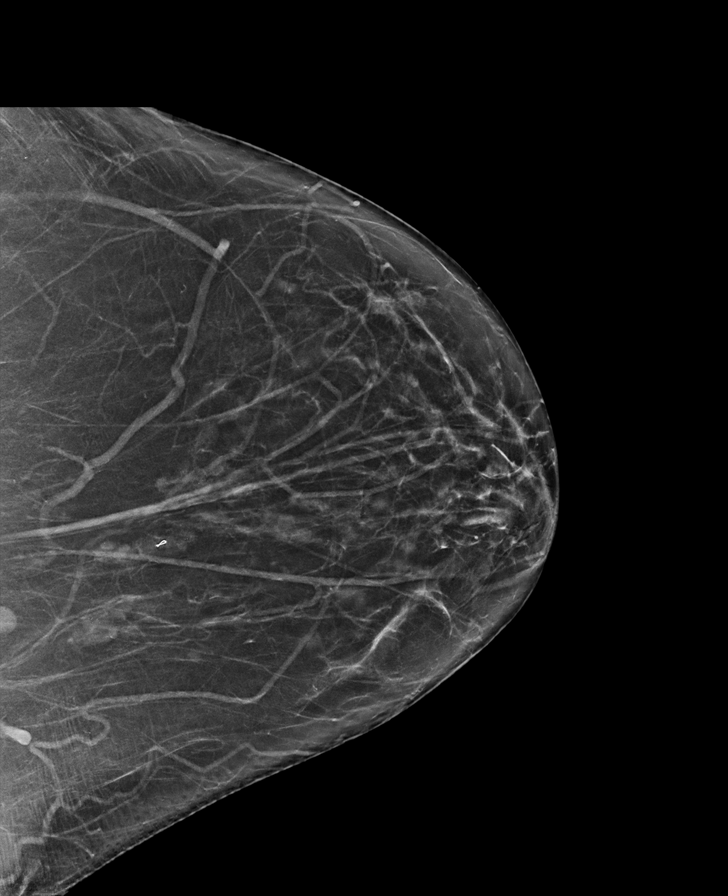

[R MLO synth-2D]
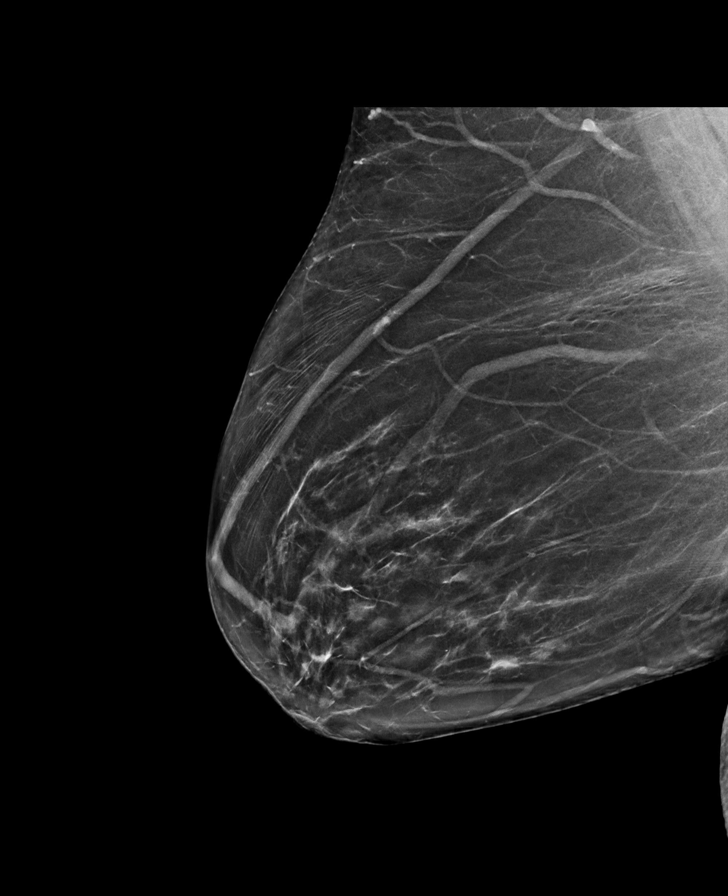

[R MLO tomo · tomo slice 38/75.0]
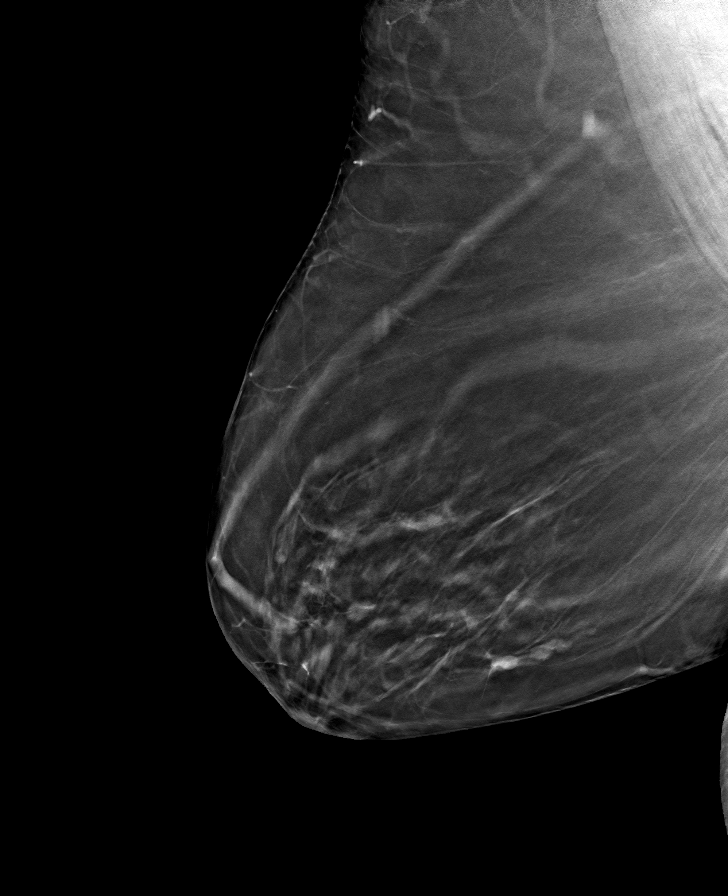

[L MLO tomo · tomo slice 41/81.0]
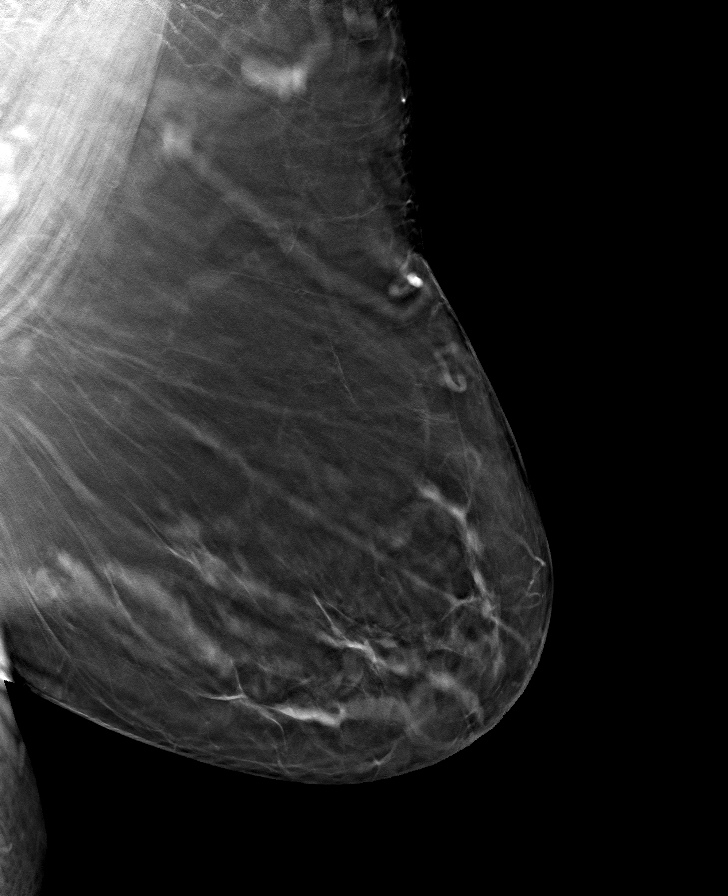

[L CC tomo · tomo slice 34/67.0]
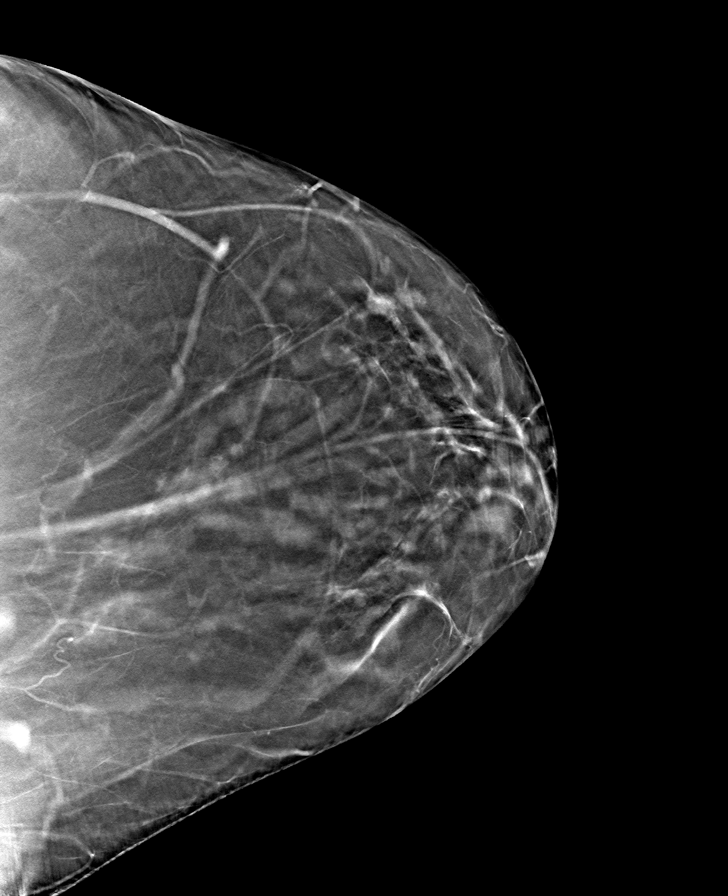

[R CC tomo · tomo slice 31/62.0]
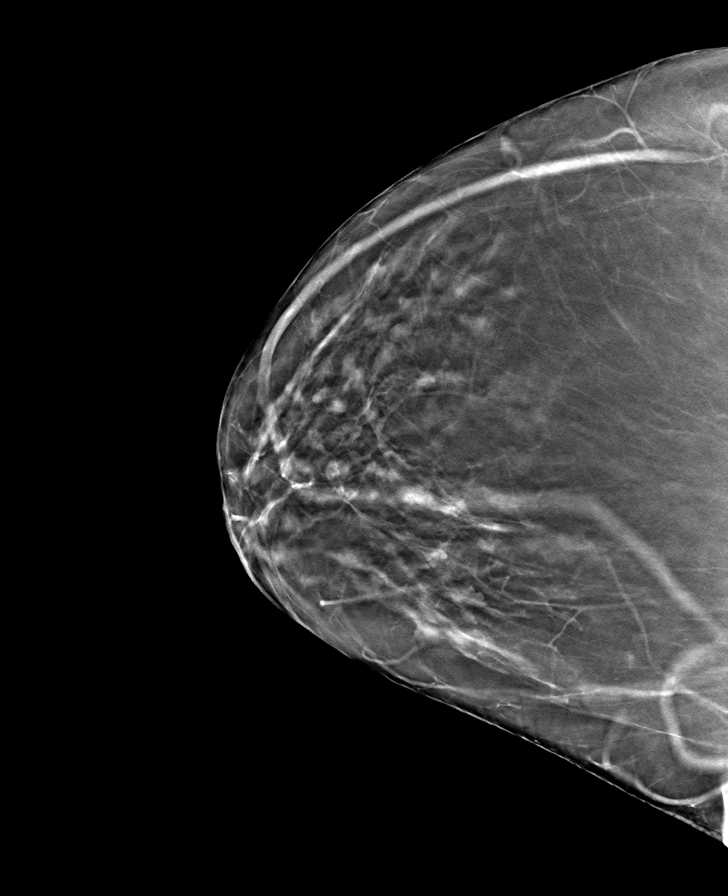

[8 of 24 positions shown; findings below may reference images not displayed]

ACR Breast Density Category b: There are scattered areas of
fibroglandular density.
FINDINGS: There are no findings suspicious for malignancy. Images were
processed with CAD.
IMPRESSION: No mammographic evidence of malignancy. A result letter of this
screening mammogram will be mailed directly to the patient.

RECOMMENDATION:
Screening mammogram in one year. (Code:[id number])

BI-RADS CATEGORY  1: Negative.

## 2021-08-21 ENCOUNTER — Encounter (INDEPENDENT_AMBULATORY_CARE_PROVIDER_SITE_OTHER): Payer: Self-pay | Admitting: Bariatrics

## 2021-08-21 ENCOUNTER — Ambulatory Visit (INDEPENDENT_AMBULATORY_CARE_PROVIDER_SITE_OTHER): Payer: Medicare Other | Admitting: Bariatrics

## 2021-08-21 VITALS — BP 158/85 | HR 79 | Temp 98.2°F | Ht 63.0 in | Wt 269.0 lb

## 2021-08-21 DIAGNOSIS — R0602 Shortness of breath: Secondary | ICD-10-CM

## 2021-08-21 DIAGNOSIS — Z6841 Body Mass Index (BMI) 40.0 and over, adult: Secondary | ICD-10-CM

## 2021-08-21 DIAGNOSIS — E559 Vitamin D deficiency, unspecified: Secondary | ICD-10-CM | POA: Diagnosis not present

## 2021-08-21 DIAGNOSIS — R7302 Impaired glucose tolerance (oral): Secondary | ICD-10-CM | POA: Diagnosis not present

## 2021-08-21 DIAGNOSIS — J449 Chronic obstructive pulmonary disease, unspecified: Secondary | ICD-10-CM

## 2021-08-21 DIAGNOSIS — Z1331 Encounter for screening for depression: Secondary | ICD-10-CM

## 2021-08-21 DIAGNOSIS — R5383 Other fatigue: Secondary | ICD-10-CM | POA: Diagnosis not present

## 2021-08-21 DIAGNOSIS — E7800 Pure hypercholesterolemia, unspecified: Secondary | ICD-10-CM | POA: Diagnosis not present

## 2021-08-21 DIAGNOSIS — K219 Gastro-esophageal reflux disease without esophagitis: Secondary | ICD-10-CM

## 2021-08-21 DIAGNOSIS — I10 Essential (primary) hypertension: Secondary | ICD-10-CM

## 2021-08-21 DIAGNOSIS — E668 Other obesity: Secondary | ICD-10-CM

## 2021-08-22 LAB — COMPREHENSIVE METABOLIC PANEL
ALT: 8 IU/L (ref 0–32)
AST: 8 IU/L (ref 0–40)
Albumin/Globulin Ratio: 1.8 (ref 1.2–2.2)
Albumin: 4.3 g/dL (ref 3.7–4.7)
Alkaline Phosphatase: 111 IU/L (ref 44–121)
BUN/Creatinine Ratio: 21 (ref 12–28)
BUN: 17 mg/dL (ref 8–27)
Bilirubin Total: 0.3 mg/dL (ref 0.0–1.2)
CO2: 30 mmol/L — ABNORMAL HIGH (ref 20–29)
Calcium: 9.3 mg/dL (ref 8.7–10.3)
Chloride: 96 mmol/L (ref 96–106)
Creatinine, Ser: 0.8 mg/dL (ref 0.57–1.00)
Globulin, Total: 2.4 g/dL (ref 1.5–4.5)
Glucose: 102 mg/dL — ABNORMAL HIGH (ref 70–99)
Potassium: 3.9 mmol/L (ref 3.5–5.2)
Sodium: 142 mmol/L (ref 134–144)
Total Protein: 6.7 g/dL (ref 6.0–8.5)
eGFR: 78 mL/min/{1.73_m2} (ref >59)

## 2021-08-22 LAB — HEMOGLOBIN A1C
Est. average glucose Bld gHb Est-mCnc: 114 mg/dL
Hgb A1c MFr Bld: 5.6 % (ref 4.8–5.6)

## 2021-08-22 LAB — INSULIN, RANDOM: INSULIN: 12 u[IU]/mL (ref 2.6–24.9)

## 2021-08-22 LAB — TSH+T4F+T3FREE
Free T4: 0.88 ng/dL (ref 0.82–1.77)
T3, Free: 2.7 pg/mL (ref 2.0–4.4)
TSH: 4.23 u[IU]/mL (ref 0.450–4.500)

## 2021-08-22 LAB — LIPID PANEL WITH LDL/HDL RATIO
Cholesterol, Total: 134 mg/dL (ref 100–199)
HDL: 41 mg/dL (ref >39)
LDL Chol Calc (NIH): 76 mg/dL (ref 0–99)
LDL/HDL Ratio: 1.9 ratio (ref 0.0–3.2)
Triglycerides: 88 mg/dL (ref 0–149)
VLDL Cholesterol Cal: 17 mg/dL (ref 5–40)

## 2021-08-22 LAB — VITAMIN D 25 HYDROXY (VIT D DEFICIENCY, FRACTURES): Vit D, 25-Hydroxy: 10.5 ng/mL — ABNORMAL LOW (ref 30.0–100.0)

## 2021-08-23 NOTE — Progress Notes (Signed)
Chief Complaint:   OBESITY Laurie Dominguez (MR# [id number]) is a 71 y.o. female who presents for evaluation and treatment of obesity and related comorbidities. Current BMI is Body mass index is 47.65 kg/m. Etna has been struggling with her weight for many years and has been unsuccessful in either losing weight, maintaining weight loss, or reaching her healthy weight goal.  Shuntell does not like to cook as states that she is not good at it.   Stormi is currently in the action stage of change and ready to dedicate time achieving and maintaining a healthier weight. Shatora is interested in becoming our patient and working on intensive lifestyle modifications including (but not limited to) diet and exercise for weight loss.  Lashon's habits were reviewed today and are as follows: Her family eats meals together, she thinks her family will eat healthier with her, her desired weight loss is 119 pounds, she has been heavy most of her life, she started gaining weight in 2000, her heaviest weight ever was 289 pounds, she has significant food cravings issues, she snacks frequently in the evenings, she skips meals frequently, and she frequently eats larger portions than normal.  Depression Screen Smantha's Food and Mood (modified PHQ-9) score was 19.     08/21/2021    9:05 AM  Depression screen PHQ 2/9  Decreased Interest 3  Down, Depressed, Hopeless 3  PHQ - 2 Score 6  Altered sleeping 1  Tired, decreased energy 3  Change in appetite 1  Feeling bad or failure about yourself  3  Trouble concentrating 1  Moving slowly or fidgety/restless 3  Suicidal thoughts 1  PHQ-9 Score 19  Difficult doing work/chores Extremely dIfficult   Subjective:   1. Other fatigue Meeya admits to daytime somnolence and admits to waking up still tired. Patient has a history of symptoms of daytime fatigue and morning fatigue. Bryannah generally gets 6 hours of sleep per night, and states  that she has difficulty falling asleep and generally restful sleep. Snoring is present. Apneic episodes are not present. Epworth Sleepiness Score is 13.  Verline will continue activities. IC done RMR > expected.   2. SOB (shortness of breath) on exertion Benjamine Mola notes increasing shortness of breath with exercising and seems to be worsening over time with weight gain. She notes getting out of breath sooner with activity than she used to. This has not gotten worse recently. Leiyah denies shortness of breath at rest or orthopnea.   3. Essential hypertension Alana is currently taking amlodipine and Hyzaar. Her blood pressure is controlled. Her blood pressure  today is 158/85.  4. Chronic obstructive pulmonary disease, unspecified COPD type ([place]) Monda is on 3 liters of oxygen. She is taking Symbicort and albuterol.   5. Impaired glucose tolerance Emmerson started last Friday taking Ozempic.   6. Gastroesophageal reflux disease, unspecified whether esophagitis present Niema is currently taking Prilosec to control reflux.   7. Pure hypercholesterolemia Bev is taking Crestor currently.   8. Vitamin D deficiency Lycia is not on Vitamin D currently.   Assessment/Plan:   1. Other fatigue Carilyn does feel that her weight is causing her energy to be lower than it should be. Fatigue may be related to obesity, depression or many other causes. Labs will be ordered, and in the meanwhile, Leanore will focus on self care including making healthy food choices, increasing physical activity and focusing on stress reduction. Chrissa will gradually increase activities. IC done and implutes discussed. We will check  thyroid panel today. We discussed EKG today.   - EKG 12-Lead - TSH+T4F+T3Free  2. SOB (shortness of breath) on exertion Nixon does feel that she gets out of breath more easily that she used to when she exercises. Damoni's shortness of breath appears to  be obesity related and exercise induced. She has agreed to work on weight loss and gradually increase exercise to treat her exercise induced shortness of breath. Will continue to monitor closely.  - TSH+T4F+T3Free  3. Essential hypertension Annaleia will continue her medications. She will avoid salt. We will check CMP today. She is working on healthy weight loss and exercise to improve blood pressure control. We will watch for signs of hypotension as she continues her lifestyle modifications.  - Comprehensive metabolic panel  4. Chronic obstructive pulmonary disease, unspecified COPD type ([place]) Estefana will continue her medications.   5. Impaired glucose tolerance Shaune will continue taking Ozempic. We will check A1C and insulin today.   - Hemoglobin A1c - Insulin, random  6. Gastroesophageal reflux disease, unspecified whether esophagitis present Intensive lifestyle modifications are the first line treatment for this issue. Arta will continue taking Prilosec. We discussed several lifestyle modifications today and she will continue to work on diet, exercise and weight loss efforts. Orders and follow up as documented in patient record.   Counseling If a person has gastroesophageal reflux disease (GERD), food and stomach acid move back up into the esophagus and cause symptoms or problems such as damage to the esophagus. Anti-reflux measures include: raising the head of the bed, avoiding tight clothing or belts, avoiding eating late at night, not lying down shortly after mealtime, and achieving weight loss. Avoid ASA, NSAID's, caffeine, alcohol, and tobacco.  OTC Pepcid and/or Tums are often very helpful for as needed use.  However, for persisting chronic or daily symptoms, stronger medications like Omeprazole may be needed. You may need to avoid foods and drinks such as: Coffee and tea (with or without caffeine). Drinks that contain alcohol. Energy drinks and sports  drinks. Bubbly (carbonated) drinks or sodas. Chocolate and cocoa. Peppermint and mint flavorings. Garlic and onions. Horseradish. Spicy and acidic foods. These include peppers, chili powder, curry powder, vinegar, hot sauces, and BBQ sauce. Citrus fruit juices and citrus fruits, such as oranges, lemons, and limes. Tomato-based foods. These include red sauce, chili, salsa, and pizza with red sauce. Fried and fatty foods. These include donuts, french fries, potato chips, and high-fat dressings. High-fat meats. These include hot dogs, rib eye steak, sausage, ham, and bacon.   7. Pure hypercholesterolemia Cardiovascular risk and specific lipid/LDL goals reviewed.  We discussed several lifestyle modifications today and Kimanh will continue to work on diet, exercise and weight loss efforts. Javayah will continue taking Crestor. We will check Lipid panel today. Orders and follow up as documented in patient record.   Counseling Intensive lifestyle modifications are the first line treatment for this issue. Dietary changes: Increase soluble fiber. Decrease simple carbohydrates. Exercise changes: Moderate to vigorous-intensity aerobic activity 150 minutes per week if tolerated. Lipid-lowering medications: see documented in medical record.  - Lipid Panel With LDL/HDL Ratio  8. Vitamin D deficiency Low Vitamin D level contributes to fatigue and are associated with obesity, breast, and colon cancer. We will check Vitamin D level today and Gardenia will follow-up for routine testing of Vitamin D, at least 2-3 times per year to avoid over-replacement.  - VITAMIN D 25 Hydroxy (Vit-D Deficiency, Fractures)  9. Depression screening Debroah had a positive depression screening.  Depression is commonly associated with obesity and often results in emotional eating behaviors. We will monitor this closely and work on CBT to help improve the non-hunger eating patterns. Referral to Psychology may be  required if no improvement is seen as she continues in our clinic.   10. Class 3 severe obesity with serious comorbidity and body mass index (BMI) of 45.0 to 49.9 in adult, unspecified obesity type [facility]) Myron is currently in the action stage of change and her goal is to continue with weight loss efforts. I recommend Montserrath begin the structured treatment plan as follows:  She has agreed to the Category 3 Plan.  Phillipa will continue meal planning. We reviewed labs from 11/08/2020 CMP and Lipid panel. We reviewed labs from 04/17/2021 Lipid panel and CBC.  Exercise goals: No exercise has been prescribed at this time.   Behavioral modification strategies: increasing lean protein intake, decreasing simple carbohydrates, increasing vegetables, increasing water intake, decreasing eating out, no skipping meals, meal planning and cooking strategies, keeping healthy foods in the home, and planning for success.  She was informed of the importance of frequent follow-up visits to maximize her success with intensive lifestyle modifications for her multiple health conditions. She was informed we would discuss her lab results at her next visit unless there is a critical issue that needs to be addressed sooner. Kortlyn agreed to keep her next visit at the agreed upon time to discuss these results.  Objective:   Blood pressure (!) 158/85, pulse 79, temperature 98.2 F (36.8 C), height '5\' 3"'$  (1.6 m), weight 269 lb (122 kg), SpO2 92 %. Body mass index is 47.65 kg/m.  EKG: Normal sinus rhythm, rate 95 bpm.  Indirect Calorimeter completed today shows a VO2 of 309 and a REE of 2131.  Her calculated basal metabolic rate is 1610 thus her basal metabolic rate is worse than expected.  General: Cooperative, alert, well developed, in no acute distress. HEENT: Conjunctivae and lids unremarkable. Cardiovascular: Regular rhythm.  Lungs: Normal work of breathing. Neurologic: No focal deficits.  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   Lab Results  Component Value Date   CREATININE 0.80 08/21/2021   BUN 17 08/21/2021   NA 142 08/21/2021   K 3.9 08/21/2021   CL 96 08/21/2021   CO2 30 (H) 08/21/2021   Lab Results  Component Value Date   ALT 8 08/21/2021   AST 8 08/21/2021   ALKPHOS 111 08/21/2021   BILITOT 0.3 08/21/2021   Lab Results  Component Value Date   HGBA1C 5.6 08/21/2021   HGBA1C 5.8 11/08/2020   HGBA1C 5.8 (H) 11/08/2019   HGBA1C 5.8 10/26/2018   HGBA1C 6.1 (H) 04/05/2016   Lab Results  Component Value Date   INSULIN 12.0 08/21/2021   Lab Results  Component Value Date   TSH 4.230 08/21/2021   Lab Results  Component Value Date   CHOL 134 08/21/2021   HDL 41 08/21/2021   LDLCALC 76 08/21/2021   LDLDIRECT 150.5 06/20/2008   TRIG 88 08/21/2021   CHOLHDL 4 04/17/2021   Lab Results  Component Value Date   WBC 6.3 11/08/2020   HGB 12.3 11/08/2020   HCT 38.1 11/08/2020   MCV 92.0 11/08/2020   PLT 264.0 11/08/2020   No results found for: IRON, TIBC, FERRITIN  Attestation Statements:   Reviewed by clinician on day of visit: allergies, medications, problem list, medical history, surgical history, family history, social history, and previous encounter notes.  I, Lizbeth Bark, RMA, am acting as Location manager for [employer], DO.  I have reviewed the above documentation for accuracy and completeness, and I agree with the above. Jearld Lesch, DO

## 2021-08-25 ENCOUNTER — Other Ambulatory Visit: Payer: Self-pay | Admitting: Internal Medicine

## 2021-08-27 ENCOUNTER — Encounter (INDEPENDENT_AMBULATORY_CARE_PROVIDER_SITE_OTHER): Payer: Self-pay | Admitting: Bariatrics

## 2021-08-27 DIAGNOSIS — E559 Vitamin D deficiency, unspecified: Secondary | ICD-10-CM | POA: Insufficient documentation

## 2021-08-28 ENCOUNTER — Encounter (INDEPENDENT_AMBULATORY_CARE_PROVIDER_SITE_OTHER): Payer: Self-pay | Admitting: Bariatrics

## 2021-09-02 ENCOUNTER — Other Ambulatory Visit: Payer: Self-pay | Admitting: Internal Medicine

## 2021-09-04 ENCOUNTER — Encounter (INDEPENDENT_AMBULATORY_CARE_PROVIDER_SITE_OTHER): Payer: Self-pay | Admitting: Bariatrics

## 2021-09-04 ENCOUNTER — Ambulatory Visit (INDEPENDENT_AMBULATORY_CARE_PROVIDER_SITE_OTHER): Payer: Medicare Other | Admitting: Bariatrics

## 2021-09-04 VITALS — BP 134/70 | HR 79 | Temp 97.8°F | Ht 63.0 in | Wt 257.0 lb

## 2021-09-04 DIAGNOSIS — E559 Vitamin D deficiency, unspecified: Secondary | ICD-10-CM | POA: Diagnosis not present

## 2021-09-04 DIAGNOSIS — Z6841 Body Mass Index (BMI) 40.0 and over, adult: Secondary | ICD-10-CM | POA: Diagnosis not present

## 2021-09-04 DIAGNOSIS — E669 Obesity, unspecified: Secondary | ICD-10-CM

## 2021-09-04 DIAGNOSIS — E8881 Metabolic syndrome: Secondary | ICD-10-CM | POA: Diagnosis not present

## 2021-09-04 MED ORDER — CHOLECALCIFEROL 1.25 MG (50000 UT) PO TABS: 50000.0000 [IU] | ORAL_TABLET | ORAL | 0 refills | Status: DC

## 2021-09-04 NOTE — Progress Notes (Signed)
Chief Complaint:   OBESITY Laurie Dominguez is here to discuss her progress with her obesity treatment plan along with follow-up of her obesity related diagnoses. Laurie Dominguez is on the Category 3 Plan and states she is following her eating plan approximately 90% of the time. Laurie Dominguez states she is doing 0 minutes 0 times per week.  Today's visit was #: 2 Starting weight: 269 lbs Starting date: 08/21/2021 Today's weight: 257 lbs Today's date: 09/04/2021 Total lbs lost to date: 12 lbs Total lbs lost since last in-office visit: 12 lbs  Interim History: Laurie Dominguez is down 12 lbs since her first visit. She has been eating more fruit.   Subjective:   1. Vitamin D deficiency Eeva's last Vitamin D was 10.5.   2. Insulin resistance Timber's last insulin level was 12.0.   Assessment/Plan:   1. Vitamin D deficiency Low Vitamin D level contributes to fatigue and are associated with obesity, breast, and colon cancer. Laurie Dominguez agrees to start prescription Vitamin D 50,000 IU every week for 1 month with no refills and Laurie Dominguez will follow-up for routine testing of Vitamin D, at least 2-3 times per year to avoid over-replacement.  - Cholecalciferol 1.25 MG (50000 UT) TABS; Take 50,000 Units by mouth once a week.  Dispense: 4 tablet; Refill: 0  2. Insulin resistance Laurie Dominguez will minimize all carbohydrates (sweets and starches). Handouts for pre-diabetes and insulin resistance was provided today. She will continue to work on weight loss, exercise, and decreasing simple carbohydrates to help decrease the risk of diabetes. Laurie Dominguez agreed to follow-up with Laurie Dominguez as directed to closely monitor her progress.  3. Obesity, Current BMI 45.5 [name] is currently in the action stage of change. As such, her goal is to continue with weight loss efforts. She has agreed to the Category 3 Plan.   [name] will continue meal planning and she will continue intentional eating. We reviewed labs from  08/21/2021 CMP, lipid panel, Vitamin D, A1C, insulin, and thyroid panel today. Fruit Sheet was provided today.   Exercise goals: No exercise has been prescribed at this time.  Behavioral modification strategies: increasing lean protein intake, decreasing simple carbohydrates, increasing vegetables, increasing water intake, decreasing eating out, no skipping meals, meal planning and cooking strategies, keeping healthy foods in the home, and planning for success.  Laurie Dominguez has agreed to follow-up with our clinic in 2-3 weeks. She was informed of the importance of frequent follow-up visits to maximize her success with intensive lifestyle modifications for her multiple health conditions.   Objective:   Blood pressure 134/70, pulse 79, temperature 97.8 F (36.6 C), height '5\' 3"'$  (1.6 m), weight 257 lb (116.6 kg), SpO2 91 %. Body mass index is 45.53 kg/m.  General: Cooperative, alert, well developed, in no acute distress. HEENT: Conjunctivae and lids unremarkable. Cardiovascular: Regular rhythm.  Lungs: Normal work of breathing. Neurologic: No focal deficits.   Lab Results  Component Value Date   CREATININE 0.80 08/21/2021   BUN 17 08/21/2021   NA 142 08/21/2021   K 3.9 08/21/2021   CL 96 08/21/2021   CO2 30 (H) 08/21/2021   Lab Results  Component Value Date   ALT 8 08/21/2021   AST 8 08/21/2021   ALKPHOS 111 08/21/2021   BILITOT 0.3 08/21/2021   Lab Results  Component Value Date   HGBA1C 5.6 08/21/2021   HGBA1C 5.8 11/08/2020   HGBA1C 5.8 (H) 11/08/2019   HGBA1C 5.8 10/26/2018   HGBA1C 6.1 (H) 04/05/2016   Lab Results  Component Value  Date   INSULIN 12.0 08/21/2021   Lab Results  Component Value Date   TSH 4.230 08/21/2021   Lab Results  Component Value Date   CHOL 134 08/21/2021   HDL 41 08/21/2021   LDLCALC 76 08/21/2021   LDLDIRECT 150.5 06/20/2008   TRIG 88 08/21/2021   CHOLHDL 4 04/17/2021   Lab Results  Component Value Date   VD25OH 10.5 (L) 08/21/2021    Lab Results  Component Value Date   WBC 6.3 11/08/2020   HGB 12.3 11/08/2020   HCT 38.1 11/08/2020   MCV 92.0 11/08/2020   PLT 264.0 11/08/2020   No results found for: "IRON", "TIBC", "FERRITIN"  Attestation Statements:   Reviewed by clinician on day of visit: allergies, medications, problem list, medical history, surgical history, family history, social history, and previous encounter notes.  I, Laurie Dominguez, RMA, am acting as Location manager for [employer], DO.  I have reviewed the above documentation for accuracy and completeness, and I agree with the above. Laurie Lesch, DO

## 2021-09-05 ENCOUNTER — Ambulatory Visit (INDEPENDENT_AMBULATORY_CARE_PROVIDER_SITE_OTHER): Payer: Medicare Other | Admitting: Bariatrics

## 2021-09-07 ENCOUNTER — Other Ambulatory Visit (HOSPITAL_COMMUNITY): Payer: Self-pay

## 2021-09-09 ENCOUNTER — Other Ambulatory Visit: Payer: Self-pay | Admitting: Adult Health

## 2021-09-09 ENCOUNTER — Encounter (INDEPENDENT_AMBULATORY_CARE_PROVIDER_SITE_OTHER): Payer: Self-pay | Admitting: Bariatrics

## 2021-09-09 NOTE — Telephone Encounter (Signed)
Notes from OV on 05/15/21: HPI 71 year old female who presents to the office today for increased headaches over the last few months. She reports that since restarting her norvasc about a week ago ( was stopped a few months ago for concern of lower extremity edema) her headaches have improved dramatically. She has not noticed much lower extremity edema past baseline.    At home when she was checking her blood pressure she was getting readings in the 111 systolic but BP has started to come down back to baseline since restarting her Norvasc dose.  3. Essential hypertension - Would like to stay on current therapy. Advised if swelling in legs increases then let me know and we can change her BP medication

## 2021-09-11 ENCOUNTER — Other Ambulatory Visit: Payer: Self-pay | Admitting: Adult Health

## 2021-09-11 DIAGNOSIS — R7302 Impaired glucose tolerance (oral): Secondary | ICD-10-CM

## 2021-09-19 ENCOUNTER — Ambulatory Visit (INDEPENDENT_AMBULATORY_CARE_PROVIDER_SITE_OTHER): Payer: Self-pay | Admitting: Bariatrics

## 2021-09-26 ENCOUNTER — Other Ambulatory Visit: Payer: Self-pay | Admitting: Adult Health

## 2021-09-27 ENCOUNTER — Telehealth: Payer: Self-pay | Admitting: Acute Care

## 2021-09-27 NOTE — Telephone Encounter (Signed)
Laurie Dominguez were you trying to contact pt?

## 2021-10-01 ENCOUNTER — Encounter (INDEPENDENT_AMBULATORY_CARE_PROVIDER_SITE_OTHER): Payer: Self-pay | Admitting: Bariatrics

## 2021-10-01 ENCOUNTER — Ambulatory Visit (INDEPENDENT_AMBULATORY_CARE_PROVIDER_SITE_OTHER): Payer: Medicare Other | Admitting: Bariatrics

## 2021-10-01 VITALS — BP 128/80 | HR 79 | Temp 97.8°F | Ht 63.0 in | Wt 260.0 lb

## 2021-10-01 DIAGNOSIS — E8881 Metabolic syndrome: Secondary | ICD-10-CM | POA: Diagnosis not present

## 2021-10-01 DIAGNOSIS — Z6841 Body Mass Index (BMI) 40.0 and over, adult: Secondary | ICD-10-CM | POA: Diagnosis not present

## 2021-10-01 DIAGNOSIS — E669 Obesity, unspecified: Secondary | ICD-10-CM

## 2021-10-01 DIAGNOSIS — E559 Vitamin D deficiency, unspecified: Secondary | ICD-10-CM | POA: Diagnosis not present

## 2021-10-01 MED ORDER — CHOLECALCIFEROL 1.25 MG (50000 UT) PO TABS: 50000.0000 [IU] | ORAL_TABLET | ORAL | 0 refills | Status: DC

## 2021-10-02 ENCOUNTER — Encounter (INDEPENDENT_AMBULATORY_CARE_PROVIDER_SITE_OTHER): Payer: Self-pay | Admitting: Bariatrics

## 2021-10-02 NOTE — Progress Notes (Signed)
Chief Complaint:   OBESITY Laurie Dominguez is here to discuss her progress with her obesity treatment plan along with follow-up of her obesity related diagnoses. Laurie Dominguez is on the Category 3 Plan and states she is following her eating plan approximately 90% of the time. Laurie Dominguez states she is doing 0 minutes 0 times per week.  Today's visit was #: 3 Starting weight: 269 lbs Starting date: 08/21/2021 Today's weight: 260 lbs Today's date: 10/01/2021 Total lbs lost to date: 9 Total lbs lost since last in-office visit: 0  Interim History: Laurie Dominguez is up 3 pounds since her last visit.  She skipped some meals, and she is only eating 2 meals per day.  Subjective:   1. Vitamin D deficiency Laurie Dominguez recent vitamin D level was 10.5.  2. Insulin resistance Laurie Dominguez's recent insulin level was 12.0.  She is not on medications currently.  Assessment/Plan:   1. Vitamin D deficiency Laurie Dominguez will continue prescription vitamin D 50,000 units once weekly, and we will refill for 1 month.  - Cholecalciferol 1.25 MG (50000 UT) TABS; Take 50,000 Units by mouth once a week.  Dispense: 4 tablet; Refill: 0  2. Insulin resistance Laurie Dominguez will keep her starches and her sugars low.  3. Obesity, Current BMI 46.1 Laurie Dominguez is currently in the action stage of change. As such, her goal is to continue with weight loss efforts. She has agreed to the Category 3 Plan.   I discussed labs with the patient today from 08/21/2021, CMP, lipids, vitamin D, A1c, insulin, and thyroid panel.  Increase protein.  She will weigh her meat.  She will try a protein shake.  Exercise goals: No exercise has been prescribed at this time.  Behavioral modification strategies: increasing lean protein intake, decreasing simple carbohydrates, increasing vegetables, increasing water intake, decreasing eating out, no skipping meals, meal planning and cooking strategies, keeping healthy foods in the home, and planning for  success.  Laurie Dominguez has agreed to follow-up with our clinic in 2 to 3 weeks. She was informed of the importance of frequent follow-up visits to maximize her success with intensive lifestyle modifications for her multiple health conditions.   Objective:   Blood pressure 128/80, pulse 79, temperature 97.8 F (36.6 C), height '5\' 3"'$  (1.6 m), weight 260 lb (117.9 kg). Body mass index is 46.06 kg/m.  General: Cooperative, alert, well developed, in no acute distress. HEENT: Conjunctivae and lids unremarkable. Cardiovascular: Regular rhythm.  Lungs: Normal work of breathing. Neurologic: No focal deficits.   Lab Results  Component Value Date   CREATININE 0.80 08/21/2021   BUN 17 08/21/2021   NA 142 08/21/2021   K 3.9 08/21/2021   CL 96 08/21/2021   CO2 30 (H) 08/21/2021   Lab Results  Component Value Date   ALT 8 08/21/2021   AST 8 08/21/2021   ALKPHOS 111 08/21/2021   BILITOT 0.3 08/21/2021   Lab Results  Component Value Date   HGBA1C 5.6 08/21/2021   HGBA1C 5.8 11/08/2020   HGBA1C 5.8 (H) 11/08/2019   HGBA1C 5.8 10/26/2018   HGBA1C 6.1 (H) 04/05/2016   Lab Results  Component Value Date   INSULIN 12.0 08/21/2021   Lab Results  Component Value Date   TSH 4.230 08/21/2021   Lab Results  Component Value Date   CHOL 134 08/21/2021   HDL 41 08/21/2021   LDLCALC 76 08/21/2021   LDLDIRECT 150.5 06/20/2008   TRIG 88 08/21/2021   CHOLHDL 4 04/17/2021   Lab Results  Component Value Date  VD25OH 10.5 (L) 08/21/2021   Lab Results  Component Value Date   WBC 6.3 11/08/2020   HGB 12.3 11/08/2020   HCT 38.1 11/08/2020   MCV 92.0 11/08/2020   PLT 264.0 11/08/2020   No results found for: "IRON", "TIBC", "FERRITIN"  Attestation Statements:   Reviewed by clinician on day of visit: allergies, medications, problem list, medical history, surgical history, family history, social history, and previous encounter notes.   Wilhemena Durie, am acting as Location manager for  [employer], DO.  I have reviewed the above documentation for accuracy and completeness, and I agree with the above. Jearld Lesch, DO

## 2021-10-02 NOTE — Telephone Encounter (Signed)
I have signed all the cmns I have ben given.

## 2021-10-03 DIAGNOSIS — J9611 Chronic respiratory failure with hypoxia: Secondary | ICD-10-CM | POA: Diagnosis not present

## 2021-10-09 ENCOUNTER — Telehealth: Payer: Medicare Other

## 2021-10-15 ENCOUNTER — Ambulatory Visit (INDEPENDENT_AMBULATORY_CARE_PROVIDER_SITE_OTHER): Payer: Medicare Other | Admitting: Adult Health

## 2021-10-15 ENCOUNTER — Encounter (INDEPENDENT_AMBULATORY_CARE_PROVIDER_SITE_OTHER): Payer: Self-pay | Admitting: Adult Health

## 2021-10-15 VITALS — BP 118/67 | HR 85 | Temp 98.2°F | Ht 63.0 in | Wt 262.0 lb

## 2021-10-15 DIAGNOSIS — E559 Vitamin D deficiency, unspecified: Secondary | ICD-10-CM | POA: Diagnosis not present

## 2021-10-15 DIAGNOSIS — J449 Chronic obstructive pulmonary disease, unspecified: Secondary | ICD-10-CM | POA: Diagnosis not present

## 2021-10-15 DIAGNOSIS — Z9981 Dependence on supplemental oxygen: Secondary | ICD-10-CM | POA: Diagnosis not present

## 2021-10-15 DIAGNOSIS — E669 Obesity, unspecified: Secondary | ICD-10-CM | POA: Diagnosis not present

## 2021-10-15 DIAGNOSIS — Z6841 Body Mass Index (BMI) 40.0 and over, adult: Secondary | ICD-10-CM | POA: Diagnosis not present

## 2021-10-21 NOTE — Progress Notes (Signed)
Chief Complaint:   OBESITY Laurie Dominguez is here to discuss her progress with her obesity treatment plan along with follow-up of her obesity related diagnoses. [name] is on the Category 3 Plan and states she is following her eating plan approximately 90% of the time. [name] states she is doing 0 minutes 0 times per week.  Today's visit was #: 4 Starting weight: 269 lbs Starting date: 08/21/2021 Today's weight: 262 lbs Today's date: 10/15/2021 Total lbs lost to date: 7 Total lbs lost since last in-office visit: 0  Interim History:  [name] consumes 2 meals daily.   Brunch, fat-free cottage cheese, tuna salad (tuna, celery, teaspoon of mayo).   Dinner, protein (shrimp, salmon, baked pork chops, fillet, baked chicken), vegetables, salad.    Patient needs bilateral knee replacement with BMI requirement of <40.  Current weight 262 with correponding BMI 46.4  Of note: Husband Laurie Dominguez) is present during patient's office visit today.  Subjective:   1. Vitamin D deficiency On 08/21/2021, vitamin D level was 10.5, quite low. She is on weekly Ergocalciferol- tolerating well.  2. COPD mixed type (HCC) 3LNC, day/night.   Markeia has been on supplemental oxygen for 5 years.  Assessment/Plan:   1. Vitamin D deficiency Marijose will continue ergocalciferol, with no refill needed today.  2. COPD mixed type ([place]) Dashana will continue with her weight loss efforts and supplemental oxygen.  3. Obesity, Current BMI 46.4 [name] is currently in the action stage of change. As such, her goal is to continue with weight loss efforts. She has agreed to the Category 3 Plan.   Brunch is 550 cal with 40 g of protein.  Dinner 900 cal with 60 g of protein.    Exercise goals: All adults should avoid inactivity. Some physical activity is better than none, and adults who participate in any amount of physical activity gain some health benefits.  Behavioral modification strategies:  increasing lean protein intake, decreasing simple carbohydrates, meal planning and cooking strategies, keeping healthy foods in the home, and planning for success.  [name] has agreed to follow-up with our clinic in 3 weeks. She was informed of the importance of frequent follow-up visits to maximize her success with intensive lifestyle modifications for her multiple health conditions.   Objective:   Blood pressure 118/67, pulse 85, temperature 98.2 F (36.8 C), height '5\' 3"'$  (1.6 m), weight 262 lb (118.8 kg), SpO2 92 %. Body mass index is 46.41 kg/m.  General: Cooperative, alert, well developed, in no acute distress. HEENT: Conjunctivae and lids unremarkable. Cardiovascular: Regular rhythm.  Lungs: Normal work of breathing. Neurologic: No focal deficits.   Lab Results  Component Value Date   CREATININE 0.80 08/21/2021   BUN 17 08/21/2021   NA 142 08/21/2021   K 3.9 08/21/2021   CL 96 08/21/2021   CO2 30 (H) 08/21/2021   Lab Results  Component Value Date   ALT 8 08/21/2021   AST 8 08/21/2021   ALKPHOS 111 08/21/2021   BILITOT 0.3 08/21/2021   Lab Results  Component Value Date   HGBA1C 5.6 08/21/2021   HGBA1C 5.8 11/08/2020   HGBA1C 5.8 (H) 11/08/2019   HGBA1C 5.8 10/26/2018   HGBA1C 6.1 (H) 04/05/2016   Lab Results  Component Value Date   INSULIN 12.0 08/21/2021   Lab Results  Component Value Date   TSH 4.230 08/21/2021   Lab Results  Component Value Date   CHOL 134 08/21/2021   HDL 41 08/21/2021   LDLCALC 76 08/21/2021  LDLDIRECT 150.5 06/20/2008   TRIG 88 08/21/2021   CHOLHDL 4 04/17/2021   Lab Results  Component Value Date   VD25OH 10.5 (L) 08/21/2021   Lab Results  Component Value Date   WBC 6.3 11/08/2020   HGB 12.3 11/08/2020   HCT 38.1 11/08/2020   MCV 92.0 11/08/2020   PLT 264.0 11/08/2020   No results found for: "IRON", "TIBC", "FERRITIN"  Attestation Statements:   Reviewed by clinician on day of visit: allergies, medications,  problem list, medical history, surgical history, family history, social history, and previous encounter notes.   Wilhemena Durie, am acting as transcriptionist for Mina Marble, NP.  I have reviewed the above documentation for accuracy and completeness, and I agree with the above. -  [place] Hagger d. Akaisha Truman, NP-C

## 2021-10-25 ENCOUNTER — Other Ambulatory Visit: Payer: Self-pay | Admitting: Adult Health

## 2021-10-25 ENCOUNTER — Other Ambulatory Visit (INDEPENDENT_AMBULATORY_CARE_PROVIDER_SITE_OTHER): Payer: Self-pay | Admitting: Bariatrics

## 2021-10-25 DIAGNOSIS — E559 Vitamin D deficiency, unspecified: Secondary | ICD-10-CM

## 2021-10-25 DIAGNOSIS — R7302 Impaired glucose tolerance (oral): Secondary | ICD-10-CM

## 2021-10-30 ENCOUNTER — Encounter (INDEPENDENT_AMBULATORY_CARE_PROVIDER_SITE_OTHER): Payer: Self-pay

## 2021-11-03 DIAGNOSIS — J9611 Chronic respiratory failure with hypoxia: Secondary | ICD-10-CM | POA: Diagnosis not present

## 2021-11-05 ENCOUNTER — Encounter (INDEPENDENT_AMBULATORY_CARE_PROVIDER_SITE_OTHER): Payer: Self-pay | Admitting: Adult Health

## 2021-11-05 ENCOUNTER — Ambulatory Visit (INDEPENDENT_AMBULATORY_CARE_PROVIDER_SITE_OTHER): Payer: Medicare Other | Admitting: Adult Health

## 2021-11-05 VITALS — BP 116/69 | HR 84 | Temp 97.5°F | Ht 63.0 in | Wt 259.0 lb

## 2021-11-05 DIAGNOSIS — E559 Vitamin D deficiency, unspecified: Secondary | ICD-10-CM

## 2021-11-05 DIAGNOSIS — E669 Obesity, unspecified: Secondary | ICD-10-CM | POA: Diagnosis not present

## 2021-11-05 DIAGNOSIS — Z6841 Body Mass Index (BMI) 40.0 and over, adult: Secondary | ICD-10-CM

## 2021-11-05 MED ORDER — CHOLECALCIFEROL 1.25 MG (50000 UT) PO TABS
50000.0000 [IU] | ORAL_TABLET | ORAL | 0 refills | Status: DC
Start: 2021-11-05 — End: 2021-11-26

## 2021-11-08 ENCOUNTER — Telehealth: Payer: Self-pay | Admitting: Acute Care

## 2021-11-08 ENCOUNTER — Telehealth: Payer: Self-pay

## 2021-11-08 NOTE — Telephone Encounter (Signed)
Called and spoke to patient and got the confusion cleared up. No one on our end has reached out to her. CT is to be done in November 2023. Nothing further needed

## 2021-11-08 NOTE — Progress Notes (Deleted)
Subjective:   Laurie Dominguez is a 71 y.o. female who presents for Medicare Annual (Subsequent) preventive examination.  Review of Systems    ***       Objective:    There were no vitals filed for this visit. There is no height or weight on file to calculate BMI.     01/28/2021    9:00 PM 11/07/2020    8:14 AM 11/15/2019   10:03 AM 04/05/2016    3:23 AM 04/04/2016    4:42 PM  Advanced Directives  Does Patient Have a Medical Advance Directive? Yes Yes Yes No No  Type of Advance Directive Living will;Healthcare Power of Attorney Living will [place];Living will    Does patient want to make changes to medical advance directive? No - Patient declined  No - Patient declined    Copy of [place] in Chart? No - copy requested  No - copy requested    Would patient like information on creating a medical advance directive?    No - Patient declined     Current Medications (verified) Outpatient Encounter Medications as of 11/08/2021  Medication Sig   albuterol (VENTOLIN HFA) 108 (90 Base) MCG/ACT inhaler USE 2 INHALATIONS BY MOUTH  EVERY 6 HOURS AS NEEDED FOR WHEEZING OR SHORTNESS OF  BREATH   amLODipine (NORVASC) 5 MG tablet Take 1 tablet (5 mg total) by mouth daily.   budesonide-formoterol (SYMBICORT) 160-4.5 MCG/ACT inhaler Inhale 2 puffs into the lungs 2 (two) times daily. Need office visit for any additional refills   Cholecalciferol 1.25 MG (50000 UT) TABS Take 50,000 Units by mouth once a week.   diazepam (VALIUM) 2 MG tablet Take 1 tablet (2 mg total) by mouth at bedtime as needed for anxiety or muscle spasms.   furosemide (LASIX) 20 MG tablet TAKE 1 TABLET BY MOUTH  DAILY   hydrocortisone 2.5 % ointment APPLY TOPICALLY TWICE DAILY AS NEEDED   losartan-hydrochlorothiazide (HYZAAR) 100-25 MG tablet TAKE 1 TABLET BY MOUTH  DAILY   meclizine (ANTIVERT) 25 MG tablet Take 1 tablet (25 mg total) by mouth daily as needed.   omeprazole (PRILOSEC)  20 MG capsule TAKE 1 CAPSULE BY MOUTH  DAILY   ondansetron (ZOFRAN) 4 MG tablet Take 1 tablet (4 mg total) by mouth every 8 (eight) hours as needed for nausea or vomiting.   OZEMPIC, 0.25 OR 0.5 MG/DOSE, 2 MG/3ML SOPN INJECT 0.25 MG INTO THE SKIN ONCE A WEEK   PARoxetine (PAXIL) 30 MG tablet TAKE 2 TABLETS BY MOUTH IN  THE MORNING   rosuvastatin (CRESTOR) 5 MG tablet TAKE 1 TABLET BY MOUTH  EVERY OTHER DAY   traMADol (ULTRAM) 50 MG tablet TAKE 1 TABLET BY MOUTH  EVERY 6 HOURS AS NEEDED FOR MODERATE PAIN OR SEVERE  PAIN   triamcinolone (KENALOG) 0.025 % cream Apply 1 application topically 2 (two) times daily.   triamcinolone (NASACORT) 55 MCG/ACT AERO nasal inhaler Place 2 sprays into the nose daily.   No facility-administered encounter medications on file as of 11/08/2021.    Allergies (verified) Codeine phosphate and Simvastatin   History: Past Medical History:  Diagnosis Date   Colon polyp 08/16/2004   Hyperplastic   COPD (chronic obstructive pulmonary disease) (HCC)    Depression    GERD (gastroesophageal reflux disease)    High cholesterol    Hypertension    Joint pain    Nephrolithiasis    Obesity    SOB (shortness of breath)  Swelling of lower extremity    Past Surgical History:  Procedure Laterality Date   BREAST BIOPSY Left 12/2017   CHOLECYSTECTOMY     COLONOSCOPY  2006   DILATION AND CURETTAGE OF UTERUS     MOHS SURGERY     TONSILLECTOMY     Family History  Problem Relation Age of Onset   Cancer Mother        unknown primary   Liver cancer Mother        unknown primary   Other Father        spinal cord tumor   Cancer Father    Alcoholism Father    Breast cancer Neg Hx    Social History   Socioeconomic History   Marital status: Married    Spouse name: FW   Number of children: Not on file   Years of education: Not on file   Highest education level: Not on file  Occupational History   Occupation: Retired  Tobacco Use   Smoking status: Former     Packs/day: 2.00    Years: 40.00    Total pack years: 80.00    Types: Cigarettes    Quit date: 03/24/2006    Years since quitting: 15.6   Smokeless tobacco: Never  Substance and Sexual Activity   Alcohol use: No    Alcohol/week: 0.0 standard drinks of alcohol   Drug use: No   Sexual activity: Not on file  Other Topics Concern   Not on file  Social History Narrative   Not on file   Social Determinants of Health   Financial Resource Strain: Low Risk  (11/07/2020)   Overall Financial Resource Strain (CARDIA)    Difficulty of Paying Living Expenses: Not hard at all  Food Insecurity: No Food Insecurity (11/07/2020)   Hunger Vital Sign    Worried About Running Out of Food in the Last Year: Never true    [place] in the Last Year: Never true  Transportation Needs: No Transportation Needs (11/07/2020)   PRAPARE - Hydrologist (Medical): No    Lack of Transportation (Non-Medical): No  Physical Activity: Inactive (11/07/2020)   Exercise Vital Sign    Days of Exercise per Week: 0 days    Minutes of Exercise per Session: 0 min  Stress: No Stress Concern Present (11/07/2020)   [place]    Feeling of Stress : Not at all  Social Connections: Moderately Integrated (11/07/2020)   Social Connection and Isolation Panel [NHANES]    Frequency of Communication with Friends and Family: More than three times a week    Frequency of Social Gatherings with Friends and Family: Once a week    Attends Religious Services: More than 4 times per year    Active Member of [employer] or Organizations: No    Attends Archivist Meetings: Never    Marital Status: Married    Tobacco Counseling Counseling given: Not Answered   Clinical Intake:                 Diabetic?***         Activities of Daily Living     No data to display          Patient Care Team: [name], NP as PCP  - General (Family Medicine) Jerline Pain, MD as PCP - Cardiology (Cardiology) Griselda Miner, MD as Consulting Physician (Dermatology) Viona Gilmore, [facility] as  Pharmacist (Pharmacist)  Indicate any recent Medical Services you may have received from other than [facility] providers in the past year (date may be approximate).     Assessment:   This is a routine wellness examination for [name].  Hearing/Vision screen No results found.  Dietary issues and exercise activities discussed:     Goals Addressed   None    Depression Screen    08/21/2021    9:05 AM 04/12/2021    3:20 PM 02/21/2021    2:11 PM 11/08/2020    8:13 AM 11/07/2020    8:12 AM 11/15/2019   10:08 AM 11/08/2019    9:53 AM  PHQ 2/9 Scores  PHQ - 2 Score '6 3 2 '$ 0 0 0 0  PHQ- 9 Score '19 9 4 '$ 0  0     Fall Risk    04/12/2021    3:20 PM 11/08/2020    8:14 AM 11/07/2020    8:15 AM 11/15/2019   10:06 AM 11/08/2019    9:53 AM  Fall Risk   Falls in the past year? 0 0 0 0 0  Number falls in past yr:  0 0 0   Injury with Fall?  0 0 0   Risk for fall due to :   Impaired vision;Impaired mobility;Impaired balance/gait Impaired balance/gait;Medication side effect   Follow up   Falls prevention discussed Falls evaluation completed;Falls prevention discussed     FALL RISK PREVENTION PERTAINING TO THE HOME:  Any stairs in or around the home? {YES/NO:21197} If so, are there any without handrails? {YES/NO:21197} Home free of loose throw rugs in walkways, pet beds, electrical cords, etc? {YES/NO:21197} Adequate lighting in your home to reduce risk of falls? {YES/NO:21197}  ASSISTIVE DEVICES UTILIZED TO PREVENT FALLS:  Life alert? {YES/NO:21197} Use of a cane, walker or w/c? {YES/NO:21197} Grab bars in the bathroom? {YES/NO:21197} Shower chair or bench in shower? {YES/NO:21197} Elevated toilet seat or a handicapped toilet? {YES/NO:21197}  TIMED UP AND GO:  Was the test performed? {YES/NO:21197}.  Length of time to  ambulate 10 feet: *** sec.   {Appearance of ZWCH:8527782}  Cognitive Function:        11/07/2020    8:19 AM 11/15/2019   10:12 AM  6CIT Screen  What Year? 0 points 0 points  What month? 0 points 0 points  What time? 0 points 0 points  Count back from 20 0 points 0 points  Months in reverse 0 points 0 points  Repeat phrase 0 points 0 points  Total Score 0 points 0 points    Immunizations Immunization History  Administered Date(s) Administered   Fluad Quad(high Dose 65+) 01/02/2021   Influenza Split 01/14/2012, 02/02/2013, 01/19/2017   Influenza Whole 01/05/1996   Influenza, High Dose Seasonal PF 02/12/2015, 12/06/2015, 12/29/2017, 12/22/2018, 12/22/2019   Influenza,inj,Quad PF,6+ Mos 01/12/2014   Influenza-Unspecified 12/27/2020   PFIZER(Purple Top)SARS-COV-2 Vaccination 05/16/2019, 06/06/2019, 01/05/2020   Pfizer Covid-19 Vaccine Bivalent Booster 20yr & up 12/06/2020   Pneumococcal Conjugate-13 09/26/2015, 12/22/2016   Pneumococcal Polysaccharide-23 09/28/2017, 12/22/2018   Tdap 10/09/2010   Zoster Recombinat (Shingrix) 09/28/2017, 12/29/2017    {TDAP status:2101805}  {Flu Vaccine status:2101806}  {Pneumococcal vaccine status:2101807}  {Covid-19 vaccine status:2101808}  Qualifies for Shingles Vaccine? {YES/NO:21197}  Zostavax completed {YES/NO:21197}  {Shingrix Completed?:2101804}  Screening Tests Health Maintenance  Topic Date Due   TETANUS/TDAP  10/08/2020   COVID-19 Vaccine (5 - Pfizer series) 04/07/2021   INFLUENZA VACCINE  10/22/2021   Fecal DNA (Cologuard)  11/14/2021   MAMMOGRAM  07/26/2023   Pneumonia Vaccine 56+ Years old  Completed   DEXA SCAN  Completed   Hepatitis C Screening  Completed   Zoster Vaccines- Shingrix  Completed   HPV VACCINES  Aged Out    Health Maintenance  Health Maintenance Due  Topic Date Due   TETANUS/TDAP  10/08/2020   COVID-19 Vaccine (5 - Pfizer series) 04/07/2021   INFLUENZA VACCINE  10/22/2021    {Colorectal  cancer screening:2101809}  {Mammogram status:21018020}  {Bone Density status:21018021}  Lung Cancer Screening: (Low Dose CT Chest recommended if Age 32-80 years, 30 pack-year currently smoking OR have quit w/in 15years.) {DOES NOT does:27190::"does not"} qualify.   Lung Cancer Screening Referral: ***  Additional Screening:  Hepatitis C Screening: {DOES NOT does:27190::"does not"} qualify; Completed ***  Vision Screening: Recommended annual ophthalmology exams for early detection of glaucoma and other disorders of the eye. Is the patient up to date with their annual eye exam?  {YES/NO:21197} Who is the provider or what is the name of the office in which the patient attends annual eye exams? *** If pt is not established with a provider, would they like to be referred to a provider to establish care? {YES/NO:21197}.   Dental Screening: Recommended annual dental exams for proper oral hygiene  Community Resource Referral / Chronic Care Management: CRR required this visit?  {YES/NO:21197}  CCM required this visit?  {YES/NO:21197}     Plan:     I have personally reviewed and noted the following in the patient's chart:   Medical and social history Use of alcohol, tobacco or illicit drugs  Current medications and supplements including opioid prescriptions.  Functional ability and status Nutritional status Physical activity Advanced directives List of other physicians Hospitalizations, surgeries, and ER visits in previous 12 months Vitals Screenings to include cognitive, depression, and falls Referrals and appointments  In addition, I have reviewed and discussed with patient certain preventive protocols, quality metrics, and best practice recommendations. A written personalized care plan for preventive services as well as general preventive health recommendations were provided to patient.     Criselda Peaches, LPN   [id number]   Nurse Notes: ***

## 2021-11-08 NOTE — Telephone Encounter (Signed)
Unsuccessful attempt to reach patient on preferred number listed in notes for scheduled AWV. Unable to leave message on voicemail. 

## 2021-11-11 NOTE — Progress Notes (Addendum)
Chief Complaint:   OBESITY Laurie Dominguez is here to discuss her progress with her obesity treatment plan along with follow-up of her obesity related diagnoses. Darby is on the Category 3 Plan and states she is following her eating plan approximately 90% of the time. Saia states she is not currently exercising.  Today's visit was #: 5 Starting weight: 269 lbs Starting date: 08/21/2021 Today's weight: 259 lbs Today's date: 11/05/21 Total lbs lost to date: 10 Total lbs lost since last in-office visit: -3  Interim History:  She is thrilled to have lost 3 pounds since last office visit. She estimates to follow category 3 greater than 90%.  Subjective:   1. Vitamin D deficiency 08/21/2021 vitamin D level-10.5-well below goal of 50.  Assessment/Plan:   1. Vitamin D deficiency Refill - Cholecalciferol 1.25 MG (50000 UT) TABS; Take 50,000 Units by mouth once a week.  Dispense: 4 tablet; Refill: 0  2. Obesity, Current BMI 46.0 Handout: 100/200 snack calorie sheet.  Innocence is currently in the action stage of change. As such, her goal is to continue with weight loss efforts. She has agreed to the Category 3 Meal Plan.  Exercise goals: as is.  Behavioral modification strategies: increasing lean protein intake, decreasing simple carbohydrates, meal planning and cooking strategies, keeping healthy foods in the home, and planning for success.  Darcus has agreed to follow-up with our clinic in 3-4 weeks. She was informed of the importance of frequent follow-up visits to maximize her success with intensive lifestyle modifications for her multiple health conditions.   Objective:   Blood pressure 116/69, pulse 84, temperature (!) 97.5 F (36.4 C), height '5\' 3"'$  (1.6 m), weight 259 lb (117.5 kg), SpO2 91 %. Body mass index is 45.88 kg/m.  General: Cooperative, alert, well developed, in no acute distress. HEENT: Conjunctivae and lids unremarkable. Cardiovascular: Regular  rhythm.  Lungs: Normal work of breathing. Neurologic: No focal deficits.   Lab Results  Component Value Date   CREATININE 0.80 08/21/2021   BUN 17 08/21/2021   NA 142 08/21/2021   K 3.9 08/21/2021   CL 96 08/21/2021   CO2 30 (H) 08/21/2021   Lab Results  Component Value Date   ALT 8 08/21/2021   AST 8 08/21/2021   ALKPHOS 111 08/21/2021   BILITOT 0.3 08/21/2021   Lab Results  Component Value Date   HGBA1C 5.6 08/21/2021   HGBA1C 5.8 11/08/2020   HGBA1C 5.8 (H) 11/08/2019   HGBA1C 5.8 10/26/2018   HGBA1C 6.1 (H) 04/05/2016   Lab Results  Component Value Date   INSULIN 12.0 08/21/2021   Lab Results  Component Value Date   TSH 4.230 08/21/2021   Lab Results  Component Value Date   CHOL 134 08/21/2021   HDL 41 08/21/2021   LDLCALC 76 08/21/2021   LDLDIRECT 150.5 06/20/2008   TRIG 88 08/21/2021   CHOLHDL 4 04/17/2021   Lab Results  Component Value Date   VD25OH 10.5 (L) 08/21/2021   Lab Results  Component Value Date   WBC 6.3 11/08/2020   HGB 12.3 11/08/2020   HCT 38.1 11/08/2020   MCV 92.0 11/08/2020   PLT 264.0 11/08/2020   No results found for: "IRON", "TIBC", "FERRITIN"   Attestation Statements:   Reviewed by clinician on day of visit: allergies, medications, problem list, medical history, surgical history, family history, social history, and previous encounter notes.  I, Georgianne Fick, FNP, am acting as Location manager for Mina Marble, NP.  I have reviewed  the above documentation for accuracy and completeness, and I agree with the above. -  Hellon Vaccarella d. Bryler Dibble, NP-C

## 2021-11-11 NOTE — Progress Notes (Signed)
This encounter was created in error - please disregard.

## 2021-11-12 ENCOUNTER — Encounter: Payer: Self-pay | Admitting: Adult Health

## 2021-11-12 ENCOUNTER — Ambulatory Visit (INDEPENDENT_AMBULATORY_CARE_PROVIDER_SITE_OTHER): Payer: Medicare Other | Admitting: Adult Health

## 2021-11-12 VITALS — BP 130/80 | HR 68 | Temp 98.1°F | Ht 62.0 in | Wt 261.0 lb

## 2021-11-12 DIAGNOSIS — Z1211 Encounter for screening for malignant neoplasm of colon: Secondary | ICD-10-CM

## 2021-11-12 DIAGNOSIS — Z0000 Encounter for general adult medical examination without abnormal findings: Secondary | ICD-10-CM

## 2021-11-12 DIAGNOSIS — I10 Essential (primary) hypertension: Secondary | ICD-10-CM

## 2021-11-12 DIAGNOSIS — F32A Depression, unspecified: Secondary | ICD-10-CM | POA: Diagnosis not present

## 2021-11-12 DIAGNOSIS — J449 Chronic obstructive pulmonary disease, unspecified: Secondary | ICD-10-CM

## 2021-11-12 DIAGNOSIS — Z6841 Body Mass Index (BMI) 40.0 and over, adult: Secondary | ICD-10-CM

## 2021-11-12 DIAGNOSIS — E785 Hyperlipidemia, unspecified: Secondary | ICD-10-CM | POA: Diagnosis not present

## 2021-11-12 DIAGNOSIS — F419 Anxiety disorder, unspecified: Secondary | ICD-10-CM

## 2021-11-12 DIAGNOSIS — Z7409 Other reduced mobility: Secondary | ICD-10-CM

## 2021-11-12 DIAGNOSIS — R7302 Impaired glucose tolerance (oral): Secondary | ICD-10-CM

## 2021-11-12 MED ORDER — SEMAGLUTIDE(0.25 OR 0.5MG/DOS) 2 MG/3ML ~~LOC~~ SOPN: 0.5000 mg | PEN_INJECTOR | SUBCUTANEOUS | 2 refills | Status: DC

## 2021-11-12 NOTE — Progress Notes (Signed)
Subjective:    Patient ID: Laurie Dominguez, female    DOB: Dec 19, 1950, 71 y.o.   MRN: [id number]  HPI Patient presents for yearly preventative medicine examination. He is a pleasant 71 year old female who  has a past medical history of Colon polyp (08/16/2004), COPD (chronic obstructive pulmonary disease) (HCC), Depression, GERD (gastroesophageal reflux disease), High cholesterol, Hypertension, Joint pain, Nephrolithiasis, Obesity, SOB (shortness of breath), and Swelling of lower extremity.  Hypertension-managed with Norvasc 5 mg, Hyzaar 100-25 mg daily, and Lasix 20 mg daily PRN .  She denies dizziness, lightheadedness, chest pain, or shortness of breath BP Readings from Last 3 Encounters:  11/12/21 130/80  11/05/21 116/69  10/15/21 118/67   COPD-uses nocturnal oxygen as well as Symbicort.  She is followed by pulmonary.  Feels as though her symptoms are controlled  Anxiety/depression-takes Paxil 60 mg daily and Valium 2 mg as needed, she does feel well controlled.  Denies any depressive or anxiety symptoms  Osteoarthritis of bilateral knees-uses tramadol 50 mg as needed and naproxen as needed.  She does not ambulate much due to chronic pain.  Uses a motorized wheelchair to get around.  Hyperlipidemia -tolerating Crestor 5 mg every other day.  Denies myalgia or fatigue with this regimen  Psoriasis -uses steroid creams.  Managed by dermatology  Impaired Glucose tolerance -has been using Ozempic 0.25 mg weekly.  She is tolerating this medication well. Lab Results  Component Value Date   HGBA1C 5.6 08/21/2021   Obesity - Is being seen [facility] at [facility] and finds this useful. She is also taking Ozempic 0.25 mg and finds that this decreases her appetite   All immunizations and health maintenance protocols were reviewed with the patient and needed orders were placed.  Appropriate screening laboratory values were ordered for the patient including screening of  hyperlipidemia, renal function and hepatic function. If indicated by BPH, a PSA was ordered.  Medication reconciliation,  past medical history, social history, problem list and allergies were reviewed in detail with the patient  Goals were established with regard to weight loss, exercise, and  diet in compliance with medications  Wt Readings from Last 10 Encounters:  11/12/21 261 lb (118.4 kg)  11/05/21 259 lb (117.5 kg)  10/15/21 262 lb (118.8 kg)  10/01/21 260 lb (117.9 kg)  09/04/21 257 lb (116.6 kg)  08/21/21 269 lb (122 kg)  05/15/21 281 lb (127.5 kg)  04/12/21 284 lb 9.6 oz (129.1 kg)  02/28/21 285 lb (129.3 kg)  02/21/21 287 lb 9.6 oz (130.5 kg)   Review of Systems  Constitutional: Negative.   HENT: Negative.    Eyes: Negative.   Respiratory: Negative.    Cardiovascular: Negative.   Gastrointestinal: Negative.   Endocrine: Negative.   Genitourinary: Negative.   Musculoskeletal:  Positive for arthralgias and joint swelling.  Skin: Negative.   Allergic/Immunologic: Negative.   Neurological: Negative.   Hematological: Negative.   Psychiatric/Behavioral: Negative.     Past Medical History:  Diagnosis Date   Colon polyp 08/16/2004   Hyperplastic   COPD (chronic obstructive pulmonary disease) (HCC)    Depression    GERD (gastroesophageal reflux disease)    High cholesterol    Hypertension    Joint pain    Nephrolithiasis    Obesity    SOB (shortness of breath)    Swelling of lower extremity     Social History   Socioeconomic History   Marital status: Married    Spouse name: [employer]  Number of children: Not on file   Years of education: Not on file   Highest education level: Not on file  Occupational History   Occupation: Retired  Tobacco Use   Smoking status: Former    Packs/day: 2.00    Years: 40.00    Total pack years: 80.00    Types: Cigarettes    Quit date: 03/24/2006    Years since quitting: 15.6   Smokeless tobacco: Never  Substance and Sexual  Activity   Alcohol use: No    Alcohol/week: 0.0 standard drinks of alcohol   Drug use: No   Sexual activity: Not on file  Other Topics Concern   Not on file  Social History Narrative   Not on file   Social Determinants of Health   Financial Resource Strain: Low Risk  (11/07/2020)   Overall Financial Resource Strain (CARDIA)    Difficulty of Paying Living Expenses: Not hard at all  Food Insecurity: No Food Insecurity (11/07/2020)   Hunger Vital Sign    Worried About Running Out of Food in the Last Year: Never true    [place] in the Last Year: Never true  Transportation Needs: No Transportation Needs (11/07/2020)   PRAPARE - Hydrologist (Medical): No    Lack of Transportation (Non-Medical): No  Physical Activity: Inactive (11/07/2020)   Exercise Vital Sign    Days of Exercise per Week: 0 days    Minutes of Exercise per Session: 0 min  Stress: No Stress Concern Present (11/07/2020)   [place]    Feeling of Stress : Not at all  Social Connections: Moderately Integrated (11/07/2020)   Social Connection and Isolation Panel [NHANES]    Frequency of Communication with Friends and Family: More than three times a week    Frequency of Social Gatherings with Friends and Family: Once a week    Attends Religious Services: More than 4 times per year    Active Member of [employer] or Organizations: No    Attends Archivist Meetings: Never    Marital Status: Married  Human resources officer Violence: Not At Risk (11/07/2020)   Humiliation, Afraid, Rape, and Kick questionnaire    Fear of Current or Ex-Partner: No    Emotionally Abused: No    Physically Abused: No    Sexually Abused: No    Past Surgical History:  Procedure Laterality Date   BREAST BIOPSY Left 12/2017   CHOLECYSTECTOMY     COLONOSCOPY  2006   DILATION AND CURETTAGE OF UTERUS     MOHS SURGERY     TONSILLECTOMY       Family History  Problem Relation Age of Onset   Cancer Mother        unknown primary   Liver cancer Mother        unknown primary   Other Father        spinal cord tumor   Cancer Father    Alcoholism Father    Breast cancer Neg Hx     Allergies  Allergen Reactions   Codeine Phosphate    Simvastatin     Myalgia     Current Outpatient Medications on File Prior to Visit  Medication Sig Dispense Refill   albuterol (VENTOLIN HFA) 108 (90 Base) MCG/ACT inhaler USE 2 INHALATIONS BY MOUTH  EVERY 6 HOURS AS NEEDED FOR WHEEZING OR SHORTNESS OF  BREATH 34 g 3   amLODipine (NORVASC) 5  MG tablet Take 1 tablet (5 mg total) by mouth daily. 90 tablet 1   budesonide-formoterol (SYMBICORT) 160-4.5 MCG/ACT inhaler Inhale 2 puffs into the lungs 2 (two) times daily. Need office visit for any additional refills 30.6 each 0   Cholecalciferol 1.25 MG (50000 UT) TABS Take 50,000 Units by mouth once a week. 4 tablet 0   diazepam (VALIUM) 2 MG tablet Take 1 tablet (2 mg total) by mouth at bedtime as needed for anxiety or muscle spasms. 30 tablet 0   furosemide (LASIX) 20 MG tablet TAKE 1 TABLET BY MOUTH  DAILY 90 tablet 0   hydrocortisone 2.5 % ointment APPLY TOPICALLY TWICE DAILY AS NEEDED 28.35 g 3   losartan-hydrochlorothiazide (HYZAAR) 100-25 MG tablet TAKE 1 TABLET BY MOUTH  DAILY 90 tablet 3   meclizine (ANTIVERT) 25 MG tablet Take 1 tablet (25 mg total) by mouth daily as needed. 90 tablet 1   omeprazole (PRILOSEC) 20 MG capsule TAKE 1 CAPSULE BY MOUTH  DAILY 90 capsule 3   ondansetron (ZOFRAN) 4 MG tablet Take 1 tablet (4 mg total) by mouth every 8 (eight) hours as needed for nausea or vomiting. 20 tablet 0   OZEMPIC, 0.25 OR 0.5 MG/DOSE, 2 MG/3ML SOPN INJECT 0.25 MG INTO THE SKIN ONCE A WEEK 3 mL 0   PARoxetine (PAXIL) 30 MG tablet TAKE 2 TABLETS BY MOUTH IN  THE MORNING 180 tablet 3   rosuvastatin (CRESTOR) 5 MG tablet TAKE 1 TABLET BY MOUTH  EVERY OTHER DAY 45 tablet 3   traMADol (ULTRAM) 50  MG tablet TAKE 1 TABLET BY MOUTH  EVERY 6 HOURS AS NEEDED FOR MODERATE PAIN OR SEVERE  PAIN 30 tablet 2   triamcinolone (KENALOG) 0.025 % cream Apply 1 application topically 2 (two) times daily.     triamcinolone (NASACORT) 55 MCG/ACT AERO nasal inhaler Place 2 sprays into the nose daily. 32.4 mL 3   Cholecalciferol (VITAMIN D3) 1.25 MG (50000 UT) CAPS Take 1 capsule by mouth once a week.     No current facility-administered medications on file prior to visit.    BP 130/80   Pulse 68   Temp 98.1 F (36.7 C) (Oral)   Ht '5\' 2"'$  (1.575 m)   Wt 261 lb (118.4 kg)   SpO2 93% Comment: o2  setting at 3  BMI 47.74 kg/m       Objective:   Physical Exam Vitals and nursing note reviewed.  Constitutional:      General: She is not in acute distress.    Appearance: Normal appearance. She is well-developed. She is obese. She is not ill-appearing.  HENT:     Head: Normocephalic and atraumatic.     Right Ear: Tympanic membrane, ear canal and external ear normal. There is no impacted cerumen.     Left Ear: Tympanic membrane, ear canal and external ear normal. There is no impacted cerumen.     Nose: Nose normal. No congestion or rhinorrhea.     Mouth/Throat:     Mouth: Mucous membranes are moist.     Pharynx: Oropharynx is clear. No oropharyngeal exudate or posterior oropharyngeal erythema.  Eyes:     General:        Right eye: No discharge.        Left eye: No discharge.     Extraocular Movements: Extraocular movements intact.     Conjunctiva/sclera: Conjunctivae normal.     Pupils: Pupils are equal, round, and reactive to light.  Neck:  Thyroid: No thyromegaly.     Vascular: No carotid bruit.     Trachea: No tracheal deviation.  Cardiovascular:     Rate and Rhythm: Normal rate and regular rhythm.     Pulses: Normal pulses.     Heart sounds: Normal heart sounds. No murmur heard.    No friction rub. No gallop.  Pulmonary:     Effort: Pulmonary effort is normal. No respiratory  distress.     Breath sounds: Normal breath sounds. No stridor. No wheezing, rhonchi or rales.  Chest:     Chest wall: No tenderness.  Abdominal:     General: Abdomen is flat. Bowel sounds are normal. There is no distension.     Palpations: Abdomen is soft. There is no mass.     Tenderness: There is no abdominal tenderness. There is no right CVA tenderness, left CVA tenderness, guarding or rebound.     Hernia: No hernia is present.  Musculoskeletal:        General: No swelling, tenderness, deformity or signs of injury. Normal range of motion.     Cervical back: Normal range of motion and neck supple.     Right lower leg: No edema.     Left lower leg: No edema.  Lymphadenopathy:     Cervical: No cervical adenopathy.  Skin:    General: Skin is warm and dry.     Coloration: Skin is not jaundiced or pale.     Findings: No bruising, erythema, lesion or rash.  Neurological:     General: No focal deficit present.     Mental Status: She is alert and oriented to person, place, and time.     Cranial Nerves: No cranial nerve deficit.     Sensory: No sensory deficit.     Motor: Weakness present.     Coordination: Coordination normal.     Gait: Gait abnormal.     Deep Tendon Reflexes: Reflexes normal.     Comments: In motorized wheelchair for exam   Psychiatric:        Mood and Affect: Mood normal.        Behavior: Behavior normal.        Thought Content: Thought content normal.        Judgment: Judgment normal.       Assessment & Plan:  1. Routine general medical examination at a health care facility - All lab work done by [facility] less than 3 months ago. Do not see a need to repeat at this time  - Continue to work on weight loss measures - Follow up with specialists as needed - Follow up in one year or sooner if needed  2. Obesity, Current BMI 46.0  - Semaglutide,0.25 or 0.'5MG'$ /DOS, 2 MG/3ML SOPN; Inject 0.5 mg into the skin once a week.  Dispense: 3 mL; Refill:  2  3. COPD mixed type ([place]) - Per pulmonary   4. Essential hypertension - Well controlled.   5. Impaired glucose tolerance  - Semaglutide,0.25 or 0.'5MG'$ /DOS, 2 MG/3ML SOPN; Inject 0.5 mg into the skin once a week.  Dispense: 3 mL; Refill: 2  6. Hyperlipidemia, unspecified hyperlipidemia type - Continue with statin therapy   7. Mobility impaired   8. Anxiety and depression - Continue with Paxil daily and valium PRN   9. Colon cancer screening  - Cologuard   [name], NP

## 2021-11-12 NOTE — Patient Instructions (Addendum)
It was great seeing you today   We will follow up with you regarding your lab work   Please let me know if you need anything   

## 2021-11-18 ENCOUNTER — Other Ambulatory Visit: Payer: Self-pay | Admitting: Adult Health

## 2021-11-19 ENCOUNTER — Ambulatory Visit (INDEPENDENT_AMBULATORY_CARE_PROVIDER_SITE_OTHER): Payer: Medicare Other

## 2021-11-19 VITALS — Ht 62.0 in | Wt 260.0 lb

## 2021-11-19 DIAGNOSIS — Z0000 Encounter for general adult medical examination without abnormal findings: Secondary | ICD-10-CM

## 2021-11-19 NOTE — Progress Notes (Signed)
I connected with Laurie Dominguez today by telephone and verified that I am speaking with the correct person using two identifiers. Location patient: home Location provider: work Persons participating in the virtual visit: Tashala Cumbo, Glenna Durand LPN.   I discussed the limitations, risks, security and privacy concerns of performing an evaluation and management service by telephone and the availability of in person appointments. I also discussed with the patient that there may be a patient responsible charge related to this service. The patient expressed understanding and verbally consented to this telephonic visit.    Interactive audio and video telecommunications were attempted between this provider and patient, however failed, due to patient having technical difficulties OR patient did not have access to video capability.  We continued and completed visit with audio only.     Vital signs may be patient reported or missing.  Subjective:   Laurie Dominguez is a 70 y.o. female who presents for Medicare Annual (Subsequent) preventive examination.  Review of Systems     Cardiac Risk Factors include: advanced age (>45mn, >>5women);hypertension;obesity (BMI >30kg/m2)     Objective:    Today's Vitals   11/19/21 0826  Weight: 260 lb (117.9 kg)  Height: '5\' 2"'$  (1.575 m)   Body mass index is 47.55 kg/m.     11/19/2021    8:32 AM 01/28/2021    9:00 PM 11/07/2020    8:14 AM 11/15/2019   10:03 AM 04/05/2016    3:23 AM 04/04/2016    4:42 PM  Advanced Directives  Does Patient Have a Medical Advance Directive? Yes Yes Yes Yes No No  Type of AParamedicof AKirkLiving will Living will;Healthcare Power of Attorney Living will HBartlettLiving will    Does patient want to make changes to medical advance directive?  No - Patient declined  No - Patient declined    Copy of HSeguinin Chart? Yes - validated most recent copy  scanned in chart (See row information) No - copy requested  No - copy requested    Would patient like information on creating a medical advance directive?     No - Patient declined     Current Medications (verified) Outpatient Encounter Medications as of 11/19/2021  Medication Sig   albuterol (VENTOLIN HFA) 108 (90 Base) MCG/ACT inhaler USE 2 INHALATIONS BY MOUTH  EVERY 6 HOURS AS NEEDED FOR WHEEZING OR SHORTNESS OF  BREATH   amLODipine (NORVASC) 5 MG tablet Take 1 tablet (5 mg total) by mouth daily.   budesonide-formoterol (SYMBICORT) 160-4.5 MCG/ACT inhaler Inhale 2 puffs into the lungs 2 (two) times daily. Need office visit for any additional refills   Cholecalciferol (VITAMIN D3) 1.25 MG (50000 UT) CAPS Take 1 capsule by mouth once a week.   Cholecalciferol 1.25 MG (50000 UT) TABS Take 50,000 Units by mouth once a week.   diazepam (VALIUM) 2 MG tablet Take 1 tablet (2 mg total) by mouth at bedtime as needed for anxiety or muscle spasms.   furosemide (LASIX) 20 MG tablet TAKE 1 TABLET BY MOUTH DAILY   losartan-hydrochlorothiazide (HYZAAR) 100-25 MG tablet TAKE 1 TABLET BY MOUTH  DAILY   meclizine (ANTIVERT) 25 MG tablet Take 1 tablet (25 mg total) by mouth daily as needed.   omeprazole (PRILOSEC) 20 MG capsule TAKE 1 CAPSULE BY MOUTH  DAILY   OZEMPIC, 0.25 OR 0.5 MG/DOSE, 2 MG/3ML SOPN INJECT 0.25 MG INTO THE SKIN ONCE A WEEK   PARoxetine (PAXIL) 30  MG tablet TAKE 2 TABLETS BY MOUTH IN  THE MORNING   rosuvastatin (CRESTOR) 5 MG tablet TAKE 1 TABLET BY MOUTH  EVERY OTHER DAY   Semaglutide,0.25 or 0.'5MG'$ /DOS, 2 MG/3ML SOPN Inject 0.5 mg into the skin once a week.   traMADol (ULTRAM) 50 MG tablet TAKE 1 TABLET BY MOUTH  EVERY 6 HOURS AS NEEDED FOR MODERATE PAIN OR SEVERE  PAIN   triamcinolone (KENALOG) 0.025 % cream Apply 1 application topically 2 (two) times daily.   triamcinolone (NASACORT) 55 MCG/ACT AERO nasal inhaler Place 2 sprays into the nose daily.   No facility-administered encounter  medications on file as of 11/19/2021.    Allergies (verified) Codeine phosphate and Simvastatin   History: Past Medical History:  Diagnosis Date   Colon polyp 08/16/2004   Hyperplastic   COPD (chronic obstructive pulmonary disease) (HCC)    Depression    GERD (gastroesophageal reflux disease)    High cholesterol    Hypertension    Joint pain    Nephrolithiasis    Obesity    SOB (shortness of breath)    Swelling of lower extremity    Past Surgical History:  Procedure Laterality Date   BREAST BIOPSY Left 12/2017   CHOLECYSTECTOMY     COLONOSCOPY  2006   DILATION AND CURETTAGE OF UTERUS     MOHS SURGERY     TONSILLECTOMY     Family History  Problem Relation Age of Onset   Cancer Mother        unknown primary   Liver cancer Mother        unknown primary   Other Father        spinal cord tumor   Cancer Father    Alcoholism Father    Breast cancer Neg Hx    Social History   Socioeconomic History   Marital status: Married    Spouse name: FW   Number of children: Not on file   Years of education: Not on file   Highest education level: Not on file  Occupational History   Occupation: Retired  Tobacco Use   Smoking status: Former    Packs/day: 2.00    Years: 40.00    Total pack years: 80.00    Types: Cigarettes    Quit date: 03/24/2006    Years since quitting: 15.6   Smokeless tobacco: Never  Vaping Use   Vaping Use: Never used  Substance and Sexual Activity   Alcohol use: No    Alcohol/week: 0.0 standard drinks of alcohol   Drug use: No   Sexual activity: Not on file  Other Topics Concern   Not on file  Social History Narrative   Not on file   Social Determinants of Health   Financial Resource Strain: Low Risk  (11/19/2021)   Overall Financial Resource Strain (CARDIA)    Difficulty of Paying Living Expenses: Not hard at all  Food Insecurity: No Food Insecurity (11/19/2021)   Hunger Vital Sign    Worried About Running Out of Food in the Last Year:  Never true    Ran Out of Food in the Last Year: Never true  Transportation Needs: No Transportation Needs (11/19/2021)   PRAPARE - Hydrologist (Medical): No    Lack of Transportation (Non-Medical): No  Physical Activity: Inactive (11/19/2021)   Exercise Vital Sign    Days of Exercise per Week: 0 days    Minutes of Exercise per Session: 0 min  Stress: No Stress  Concern Present (11/19/2021)   [place]    Feeling of Stress : Not at all  Social Connections: Moderately Integrated (11/07/2020)   Social Connection and Isolation Panel [NHANES]    Frequency of Communication with Friends and Family: More than three times a week    Frequency of Social Gatherings with Friends and Family: Once a week    Attends Religious Services: More than 4 times per year    Active Member of [employer] or Organizations: No    Attends Music therapist: Never    Marital Status: Married    Tobacco Counseling Counseling given: Not Answered   Clinical Intake:  Pre-visit preparation completed: Yes  Pain : No/denies pain     Nutritional Status: BMI > 30  Obese Nutritional Risks: None Diabetes: No  How often do you need to have someone help you when you read instructions, pamphlets, or other written materials from your doctor or pharmacy?: 1 - Never What is the last grade level you completed in school?: BS degree  Diabetic? no  Interpreter Needed?: No  Information entered by :: NAllen LPN   Activities of Daily Living    11/19/2021    8:34 AM 11/12/2021    8:02 AM  In your present state of health, do you have any difficulty performing the following activities:  Hearing? 1 1  Vision? 0 0  Dominguez distance vision not as it should be   Difficulty concentrating or making decisions? 0 0  Walking or climbing stairs? 1 1  Dressing or bathing? 0 0  Doing errands, shopping? 0 1  Preparing Food and  eating ? Y   Dominguez husband prepares meals   Using the Toilet? N   In the past six months, have you accidently leaked urine? Y   Do you have problems with loss of bowel control? N   Managing your Medications? N   Managing your Finances? N   Housekeeping or managing your Housekeeping? Y     Patient Care Team: [name], NP as PCP - General (Family Medicine) Jerline Pain, MD as PCP - Cardiology (Cardiology) Griselda Miner, MD as Consulting Physician (Dermatology) Viona Gilmore, [facility] as Pharmacist (Pharmacist)  Indicate any recent Medical Services you may have received from other than [facility] providers in the past year (date may be approximate).     Assessment:   This is a routine wellness examination for [place].  Hearing/Vision screen Vision Screening - Comments:: Regular eye exams, [employer]  Dietary issues and exercise activities discussed: Current Exercise Habits: The patient does not participate in regular exercise at present   Goals Addressed             This Visit's Progress    Patient Stated       11/19/2021, working with health and wellness program       Depression Screen    11/19/2021    8:33 AM 11/12/2021    8:01 AM 08/21/2021    9:05 AM 04/12/2021    3:20 PM 02/21/2021    2:11 PM 11/08/2020    8:13 AM 11/07/2020    8:12 AM  PHQ 2/9 Scores  PHQ - 2 Score 0 '2 6 3 2 '$ 0 0  PHQ- 9 Score  '5 19 9 4 '$ 0     Fall Risk    11/19/2021    8:33 AM 11/12/2021    8:09 AM 11/12/2021    8:02 AM 04/12/2021  3:20 PM 11/08/2020    8:14 AM  Fall Risk   Falls in the past year? 0 0 0 0 0  Number falls in past yr: 0 0 0  0  Injury with Fall? 0 0 0  0  Risk for fall due to : Medication side effect No Fall Risks No Fall Risks    Follow up Education provided;Falls evaluation completed;Falls prevention discussed Falls evaluation completed Falls evaluation completed      FALL RISK PREVENTION PERTAINING TO THE HOME:  Any stairs in or around the home? Yes  If so,  are there any without handrails? No  Home free of loose throw rugs in walkways, pet beds, electrical cords, etc? Yes  Adequate lighting in your home to reduce risk of falls? Yes   ASSISTIVE DEVICES UTILIZED TO PREVENT FALLS:  Life alert? No  Use of a cane, walker or w/c? Yes  Grab bars in the bathroom? Yes  Shower chair or bench in shower? Yes  Elevated toilet seat or a handicapped toilet? Yes   TIMED UP AND GO:  Was the test performed? No .      Cognitive Function:        11/19/2021    8:41 AM 11/07/2020    8:19 AM 11/15/2019   10:12 AM  6CIT Screen  What Year? 0 points 0 points 0 points  What month? 0 points 0 points 0 points  What time? 0 points 0 points 0 points  Count back from 20 0 points 0 points 0 points  Months in reverse 0 points 0 points 0 points  Repeat phrase 4 points 0 points 0 points  Total Score 4 points 0 points 0 points    Immunizations Immunization History  Administered Date(s) Administered   Fluad Quad(high Dose 65+) 01/02/2021   Influenza Split 01/14/2012, 02/02/2013, 01/19/2017   Influenza Whole 01/05/1996   Influenza, High Dose Seasonal PF 02/12/2015, 12/06/2015, 12/29/2017, 12/22/2018, 12/22/2019   Influenza,inj,Quad PF,6+ Mos 01/12/2014   Influenza-Unspecified 12/27/2020   PFIZER(Purple Top)SARS-COV-2 Vaccination 05/16/2019, 06/06/2019, 01/05/2020   Pfizer Covid-19 Vaccine Bivalent Booster 56yr & up 12/06/2020   Pneumococcal Conjugate-13 09/26/2015, 12/22/2016   Pneumococcal Polysaccharide-23 09/28/2017, 12/22/2018   Tdap 10/09/2010, 11/12/2021   Zoster Recombinat (Shingrix) 09/28/2017, 12/29/2017    TDAP status: Up to date  Flu Vaccine status: Due, Education has been provided regarding the importance of this vaccine. Advised may receive this vaccine at local pharmacy or Health Dept. Aware to provide a copy of the vaccination record if obtained from local pharmacy or Health Dept. Verbalized acceptance and understanding.  Pneumococcal  vaccine status: Up to date  Covid-19 vaccine status: Completed vaccines  Qualifies for Shingles Vaccine? Yes   Zostavax completed Yes   Shingrix Completed?: Yes  Screening Tests Health Maintenance  Topic Date Due   Fecal DNA (Cologuard)  11/14/2021   INFLUENZA VACCINE  10/22/2021   COVID-19 Vaccine (5 - Pfizer series) 11/28/2021 (Originally 04/07/2021)   MAMMOGRAM  07/26/2023   TETANUS/TDAP  11/13/2031   Pneumonia Vaccine 72 Years old  Completed   DEXA SCAN  Completed   Hepatitis C Screening  Completed   Zoster Vaccines- Shingrix  Completed   HPV VACCINES  Aged Out    Health Maintenance  Health Maintenance Due  Topic Date Due   Fecal DNA (Cologuard)  11/14/2021   INFLUENZA VACCINE  10/22/2021    Colorectal cancer screening: Type of screening: Colonoscopy. Completed 11/15/2018. Repeat every 3 years  Mammogram status: Completed 07/25/2021. Repeat every year  Bone Density status: Completed 01/06/2018.   Lung Cancer Screening: (Low Dose CT Chest recommended if Age 21-80 years, 30 pack-year currently smoking OR have quit w/in 15years.) does not qualify.   Lung Cancer Screening Referral: no  Additional Screening:  Hepatitis C Screening: does qualify; Completed 09/28/2017  Vision Screening: Recommended annual ophthalmology exams for early detection of glaucoma and other disorders of the eye. Is the patient up to date with their annual eye exam?  Yes  Who is the provider or what is the name of the office in which the patient attends annual eye exams? [facility] If pt is not established with a provider, would they like to be referred to a provider to establish care? No .   Dental Screening: Recommended annual dental exams for proper oral hygiene  Community Resource Referral / Chronic Care Management: CRR required this visit?  No   CCM required this visit?  No      Plan:     I have personally reviewed and noted the following in the patient's chart:   Medical and  social history Use of alcohol, tobacco or illicit drugs  Current medications and supplements including opioid prescriptions. Patient is not currently taking opioid prescriptions. Functional ability and status Nutritional status Physical activity Advanced directives List of other physicians Hospitalizations, surgeries, and ER visits in previous 12 months Vitals Screenings to include cognitive, depression, and falls Referrals and appointments  In addition, I have reviewed and discussed with patient certain preventive protocols, quality metrics, and best practice recommendations. A written personalized care plan for preventive services as well as general preventive health recommendations were provided to patient.     Kellie Simmering, LPN   06/24/4740   Nurse Notes: none  Due to this being a virtual visit, the after visit summary with patients personalized plan was offered to patient via mail or my-chart.  Patient would like to access on my-chart

## 2021-11-19 NOTE — Patient Instructions (Signed)
[name] , Thank you for taking time to come for your Medicare Wellness Visit. I appreciate your ongoing commitment to your health goals. Please review the following plan we discussed and let me know if I can assist you in the future.   Screening recommendations/referrals: Colonoscopy: cologuard 11/15/2018, cologuard due Mammogram: completed 07/25/2021, 07/27/2022 Bone Density: completed 01/06/2018 Recommended yearly ophthalmology/optometry visit for glaucoma screening and checkup Recommended yearly dental visit for hygiene and checkup  Vaccinations: Influenza vaccine: due Pneumococcal vaccine: completed 12/22/2018 Tdap vaccine: completed 11/12/2021, due 11/13/2022 Shingles vaccine: completed   Covid-19: 12/06/2020, 01/05/2020, 06/06/2019, 05/16/2019  Advanced directives: copy in chart  Conditions/risks identified: none  Next appointment: Follow up in one year for your annual wellness visit    Preventive Care 72 Years and Older, Female Preventive care refers to lifestyle choices and visits with your health care provider that can promote health and wellness. What does preventive care include? A yearly physical exam. This is also called an annual well check. Dental exams once or twice a year. Routine eye exams. Ask your health care provider how often you should have your eyes checked. Personal lifestyle choices, including: Daily care of your teeth and gums. Regular physical activity. Eating a healthy diet. Avoiding tobacco and drug use. Limiting alcohol use. Practicing safe sex. Taking low-dose aspirin every day. Taking vitamin and mineral supplements as recommended by your health care provider. What happens during an annual well check? The services and screenings done by your health care provider during your annual well check will depend on your age, overall health, lifestyle risk factors, and family history of disease. Counseling  Your health care provider may ask you questions about  your: Alcohol use. Tobacco use. Drug use. Emotional well-being. Home and relationship well-being. Sexual activity. Eating habits. History of falls. Memory and ability to understand (cognition). Work and work Statistician. Reproductive health. Screening  You may have the following tests or measurements: Height, weight, and BMI. Blood pressure. Lipid and cholesterol levels. These may be checked every 5 years, or more frequently if you are over 85 years old. Skin check. Lung cancer screening. You may have this screening every year starting at age 82 if you have a 30-pack-year history of smoking and currently smoke or have quit within the past 15 years. Fecal occult blood test (FOBT) of the stool. You may have this test every year starting at age 101. Flexible sigmoidoscopy or colonoscopy. You may have a sigmoidoscopy every 5 years or a colonoscopy every 10 years starting at age 51. Hepatitis C blood test. Hepatitis B blood test. Sexually transmitted disease (STD) testing. Diabetes screening. This is done by checking your blood sugar (glucose) after you have not eaten for a while (fasting). You may have this done every 1-3 years. Bone density scan. This is done to screen for osteoporosis. You may have this done starting at age 66. Mammogram. This may be done every 1-2 years. Talk to your health care provider about how often you should have regular mammograms. Talk with your health care provider about your test results, treatment options, and if necessary, the need for more tests. Vaccines  Your health care provider may recommend certain vaccines, such as: Influenza vaccine. This is recommended every year. Tetanus, diphtheria, and acellular pertussis (Tdap, Td) vaccine. You may need a Td booster every 10 years. Zoster vaccine. You may need this after age 81. Pneumococcal 13-valent conjugate (PCV13) vaccine. One dose is recommended after age 59. Pneumococcal polysaccharide (PPSV23) vaccine.  One dose  is recommended after age 16. Talk to your health care provider about which screenings and vaccines you need and how often you need them. This information is not intended to replace advice given to you by your health care provider. Make sure you discuss any questions you have with your health care provider. Document Released: 04/06/2015 Document Revised: 11/28/2015 Document Reviewed: 01/09/2015 Elsevier Interactive Patient Education  2017 [place] Prevention in the Home Falls can cause injuries. They can happen to people of all ages. There are many things you can do to make your home safe and to help prevent falls. What can I do on the outside of my home? Regularly fix the edges of walkways and driveways and fix any cracks. Remove anything that might make you trip as you walk through a door, such as a raised step or threshold. Trim any bushes or trees on the path to your home. Use bright outdoor lighting. Clear any walking paths of anything that might make someone trip, such as rocks or tools. Regularly check to see if handrails are loose or broken. Make sure that both sides of any steps have handrails. Any raised decks and porches should have guardrails on the edges. Have any leaves, snow, or ice cleared regularly. Use sand or salt on walking paths during winter. Clean up any spills in your garage right away. This includes oil or grease spills. What can I do in the bathroom? Use night lights. Install grab bars by the toilet and in the tub and shower. Do not use towel bars as grab bars. Use non-skid mats or decals in the tub or shower. If you need to sit down in the shower, use a plastic, non-slip stool. Keep the floor dry. Clean up any water that spills on the floor as soon as it happens. Remove soap buildup in the tub or shower regularly. Attach bath mats securely with double-sided non-slip rug tape. Do not have throw rugs and other things on the floor that can make  you trip. What can I do in the bedroom? Use night lights. Make sure that you have a light by your bed that is easy to reach. Do not use any sheets or blankets that are too big for your bed. They should not hang down onto the floor. Have a firm chair that has side arms. You can use this for support while you get dressed. Do not have throw rugs and other things on the floor that can make you trip. What can I do in the kitchen? Clean up any spills right away. Avoid walking on wet floors. Keep items that you use a lot in easy-to-reach places. If you need to reach something above you, use a strong step stool that has a grab bar. Keep electrical cords out of the way. Do not use floor polish or wax that makes floors slippery. If you must use wax, use non-skid floor wax. Do not have throw rugs and other things on the floor that can make you trip. What can I do with my stairs? Do not leave any items on the stairs. Make sure that there are handrails on both sides of the stairs and use them. Fix handrails that are broken or loose. Make sure that handrails are as long as the stairways. Check any carpeting to make sure that it is firmly attached to the stairs. Fix any carpet that is loose or worn. Avoid having throw rugs at the top or bottom of the stairs. If  you do have throw rugs, attach them to the floor with carpet tape. Make sure that you have a light switch at the top of the stairs and the bottom of the stairs. If you do not have them, ask someone to add them for you. What else can I do to help prevent falls? Wear shoes that: Do not have high heels. Have rubber bottoms. Are comfortable and fit you well. Are closed at the toe. Do not wear sandals. If you use a stepladder: Make sure that it is fully opened. Do not climb a closed stepladder. Make sure that both sides of the stepladder are locked into place. Ask someone to hold it for you, if possible. Clearly mark and make sure that you can  see: Any grab bars or handrails. First and last steps. Where the edge of each step is. Use tools that help you move around (mobility aids) if they are needed. These include: Canes. Walkers. Scooters. Crutches. Turn on the lights when you go into a dark area. Replace any light bulbs as soon as they burn out. Set up your furniture so you have a clear path. Avoid moving your furniture around. If any of your floors are uneven, fix them. If there are any pets around you, be aware of where they are. Review your medicines with your doctor. Some medicines can make you feel dizzy. This can increase your chance of falling. Ask your doctor what other things that you can do to help prevent falls. This information is not intended to replace advice given to you by your health care provider. Make sure you discuss any questions you have with your health care provider. Document Released: 01/04/2009 Document Revised: 08/16/2015 Document Reviewed: 04/14/2014 Elsevier Interactive Patient Education  2017 [employer].

## 2021-11-20 DIAGNOSIS — Z1211 Encounter for screening for malignant neoplasm of colon: Secondary | ICD-10-CM | POA: Diagnosis not present

## 2021-11-26 ENCOUNTER — Ambulatory Visit (INDEPENDENT_AMBULATORY_CARE_PROVIDER_SITE_OTHER): Payer: Medicare Other | Admitting: Adult Health

## 2021-11-26 ENCOUNTER — Encounter (INDEPENDENT_AMBULATORY_CARE_PROVIDER_SITE_OTHER): Payer: Self-pay | Admitting: Adult Health

## 2021-11-26 VITALS — BP 122/79 | HR 93 | Temp 98.0°F | Ht 62.0 in | Wt 256.0 lb

## 2021-11-26 DIAGNOSIS — E559 Vitamin D deficiency, unspecified: Secondary | ICD-10-CM

## 2021-11-26 DIAGNOSIS — R7302 Impaired glucose tolerance (oral): Secondary | ICD-10-CM

## 2021-11-26 DIAGNOSIS — I10 Essential (primary) hypertension: Secondary | ICD-10-CM | POA: Diagnosis not present

## 2021-11-26 DIAGNOSIS — Z6841 Body Mass Index (BMI) 40.0 and over, adult: Secondary | ICD-10-CM

## 2021-11-26 DIAGNOSIS — E669 Obesity, unspecified: Secondary | ICD-10-CM | POA: Diagnosis not present

## 2021-11-26 MED ORDER — VITAMIN D (ERGOCALCIFEROL) 1.25 MG (50000 UNIT) PO CAPS: 50000.0000 [IU] | ORAL_CAPSULE | ORAL | 0 refills | Status: DC

## 2021-11-27 ENCOUNTER — Ambulatory Visit: Payer: Medicare Other

## 2021-11-27 LAB — COLOGUARD: COLOGUARD: NEGATIVE

## 2021-11-30 NOTE — Progress Notes (Unsigned)
Chief Complaint:   OBESITY Laurie Dominguez is here to discuss her progress with her obesity treatment plan along with follow-up of her obesity related diagnoses. Laurie Dominguez is on the Category 4 Plan and states she is following her eating plan approximately 90% of the time. Laurie Dominguez states she is not exercising.   Today's visit was #: 6 Starting weight: 269 lbs Starting date: 08/21/2021 Today's weight: 256 lbs Today's date: 11/26/2021 Total lbs lost to date: 13 lbs Total lbs lost since last in-office visit: 3 lbs  Interim History: Bioimpedance results:  Muscle +3.2 lbs, Adipose mass-7 lbs, reduced visceral Adipose rating from 23 to 22.   Subjective:   1. Essential hypertension Home readings:  SBP:  120's; DBP 60-70  2. Vitamin D deficiency 08/21/2021,  Vitamin D level 10.5  3. Impaired glucose tolerance PCP increased  Ozempic from 0.25 mg to 0.5 mg 2 weeks ago. ***  Assessment/Plan:   1. Essential hypertension Continue ***  2. Vitamin D deficiency Refill - Vitamin D, Ergocalciferol, (DRISDOL) 1.25 MG (50000 UNIT) CAPS capsule; Take 1 capsule (50,000 Units total) by mouth every 7 (seven) days.  Dispense: 4 capsule; Refill: 0  3. Impaired glucose tolerance Continue Ozempic 0.5 mg once weekly per PCP.   4. Obesity, Current BMI 45.4 Laurie Dominguez is currently in the action stage of change. As such, her goal is to continue with weight loss efforts. She has agreed to the Category 3 Plan.   Exercise goals: No exercise has been prescribed at this time.  Behavioral modification strategies: increasing lean protein intake, decreasing simple carbohydrates, meal planning and cooking strategies, keeping healthy foods in the home, and planning for success.  Laurie Dominguez has agreed to follow-up with our clinic in 3 weeks. She was informed of the importance of frequent follow-up visits to maximize her success with intensive lifestyle modifications for her multiple health conditions.    Objective:   Blood pressure 122/79, pulse 93, temperature 98 F (36.7 C), height '5\' 2"'$  (1.575 m), weight 256 lb (116.1 kg), SpO2 90 %. Body mass index is 46.82 kg/m.  General: Cooperative, alert, well developed, in no acute distress. HEENT: Conjunctivae and lids unremarkable. Cardiovascular: Regular rhythm.  Lungs: Normal work of breathing. Neurologic: No focal deficits.   Lab Results  Component Value Date   CREATININE 0.80 08/21/2021   BUN 17 08/21/2021   NA 142 08/21/2021   K 3.9 08/21/2021   CL 96 08/21/2021   CO2 30 (H) 08/21/2021   Lab Results  Component Value Date   ALT 8 08/21/2021   AST 8 08/21/2021   ALKPHOS 111 08/21/2021   BILITOT 0.3 08/21/2021   Lab Results  Component Value Date   HGBA1C 5.6 08/21/2021   HGBA1C 5.8 11/08/2020   HGBA1C 5.8 (H) 11/08/2019   HGBA1C 5.8 10/26/2018   HGBA1C 6.1 (H) 04/05/2016   Lab Results  Component Value Date   INSULIN 12.0 08/21/2021   Lab Results  Component Value Date   TSH 4.230 08/21/2021   Lab Results  Component Value Date   CHOL 134 08/21/2021   HDL 41 08/21/2021   LDLCALC 76 08/21/2021   LDLDIRECT 150.5 06/20/2008   TRIG 88 08/21/2021   CHOLHDL 4 04/17/2021   Lab Results  Component Value Date   VD25OH 10.5 (L) 08/21/2021   Lab Results  Component Value Date   WBC 6.3 11/08/2020   HGB 12.3 11/08/2020   HCT 38.1 11/08/2020   MCV 92.0 11/08/2020   PLT 264.0 11/08/2020  No results found for: "IRON", "TIBC", "FERRITIN"  Obesity Behavioral Intervention:   Approximately 15 minutes were spent on the discussion below.  ASK: We discussed the diagnosis of obesity with Laurie Dominguez today and Laurie Dominguez agreed to give [place] permission to discuss obesity behavioral modification therapy today.  ASSESS: Laurie Dominguez has the diagnosis of obesity and her BMI today is 45.4. Laurie Dominguez is in the action stage of change.   ADVISE: Laurie Dominguez was educated on the multiple health risks of obesity as well as the benefit  of weight loss to improve her health. She was advised of the need for long term treatment and the importance of lifestyle modifications to improve her current health and to decrease her risk of future health problems.  AGREE: Multiple dietary modification options and treatment options were discussed and Laurie Dominguez agreed to follow the recommendations documented in the above note.  ARRANGE: Laurie Dominguez was educated on the importance of frequent visits to treat obesity as outlined per CMS and USPSTF guidelines and agreed to schedule her next follow up appointment today.  Attestation Statements:   Reviewed by clinician on day of visit: allergies, medications, problem list, medical history, surgical history, family history, social history, and previous encounter notes.  I, Davy Pique, RMA, am acting as Location manager for Mina Marble, NP.  I have reviewed the above documentation for accuracy and completeness, and I agree with the above. -  ***

## 2021-12-04 DIAGNOSIS — J9611 Chronic respiratory failure with hypoxia: Secondary | ICD-10-CM | POA: Diagnosis not present

## 2021-12-06 ENCOUNTER — Other Ambulatory Visit: Payer: Self-pay | Admitting: Adult Health

## 2021-12-19 ENCOUNTER — Telehealth: Payer: Self-pay | Admitting: Adult Health

## 2021-12-19 ENCOUNTER — Ambulatory Visit (INDEPENDENT_AMBULATORY_CARE_PROVIDER_SITE_OTHER): Payer: Medicare Other | Admitting: Adult Health

## 2021-12-19 NOTE — Telephone Encounter (Signed)
Requesting refill triamcinolone (NASACORT) 55 MCG/ACT AERO nasal inhaler   [facility] ([place], [place] Phone:  [phone]  Fax:  [phone]

## 2021-12-24 ENCOUNTER — Ambulatory Visit (INDEPENDENT_AMBULATORY_CARE_PROVIDER_SITE_OTHER): Payer: Medicare Other | Admitting: Adult Health

## 2021-12-24 ENCOUNTER — Encounter (INDEPENDENT_AMBULATORY_CARE_PROVIDER_SITE_OTHER): Payer: Self-pay | Admitting: Adult Health

## 2021-12-24 VITALS — BP 151/80 | HR 77 | Temp 98.1°F | Ht 62.0 in | Wt 249.0 lb

## 2021-12-24 DIAGNOSIS — Z6841 Body Mass Index (BMI) 40.0 and over, adult: Secondary | ICD-10-CM | POA: Diagnosis not present

## 2021-12-24 DIAGNOSIS — R7301 Impaired fasting glucose: Secondary | ICD-10-CM | POA: Diagnosis not present

## 2021-12-24 DIAGNOSIS — E559 Vitamin D deficiency, unspecified: Secondary | ICD-10-CM | POA: Diagnosis not present

## 2021-12-24 DIAGNOSIS — E669 Obesity, unspecified: Secondary | ICD-10-CM

## 2021-12-24 MED ORDER — VITAMIN D (ERGOCALCIFEROL) 1.25 MG (50000 UNIT) PO CAPS: 50000.0000 [IU] | ORAL_CAPSULE | ORAL | 0 refills | Status: DC

## 2021-12-25 ENCOUNTER — Other Ambulatory Visit: Payer: Self-pay | Admitting: *Deleted

## 2021-12-25 MED ORDER — TRIAMCINOLONE ACETONIDE 55 MCG/ACT NA AERO: 2.0000 | INHALATION_SPRAY | Freq: Every day | NASAL | 3 refills | Status: AC

## 2021-12-26 NOTE — Telephone Encounter (Signed)
Rx sent on 12/25/2021

## 2022-01-01 DIAGNOSIS — R7301 Impaired fasting glucose: Secondary | ICD-10-CM | POA: Insufficient documentation

## 2022-01-01 NOTE — Progress Notes (Signed)
Chief Complaint:   OBESITY Laurie Dominguez is here to discuss her progress with her obesity treatment plan along with follow-up of her obesity related diagnoses. Laurie Dominguez is on the Category 3 Plan and states she is following her eating plan approximately 98% of the time. Laurie Dominguez states she is not exercising.   Today's visit was #: 7 Starting weight: 269 lbs Starting date: 08/21/2021 Today's weight: 249 lbs Today's date: 12/24/2021 Total lbs lost to date: 20 lbs Total lbs lost since last in-office visit: 7 lbs  Interim History: Laurie Dominguez endorses limited mobility due to oxygen therapy 2-3 via nasal cannula with large O2 tank.   Her insurance will not authorize smaller, more portable O2 delivery system.  Of note:  wonderful husband at bedside.   Subjective:   1. Vitamin D deficiency 0531/2023, Vitamin D level 10.5-significantly below goal of 50-70. She is on weekly ergocalciferol. She denies N/V/Muscle Weakness.  2. IFG (impaired fasting glucose) Epic review demonstrates BG results >99. Lab Results  Component Value Date   HGBA1C 5.6 08/21/2021   HGBA1C 5.8 11/08/2020   HGBA1C 5.8 (H) 11/08/2019     Assessment/Plan:   1. Vitamin D deficiency Check labs at next office visit.   Refill - Vitamin D, Ergocalciferol, (DRISDOL) 1.25 MG (50000 UNIT) CAPS capsule; Take 1 capsule (50,000 Units total) by mouth every 7 (seven) days.  Dispense: 4 capsule; Refill: 0  2. IFG (impaired fasting glucose) Check labs at next office visit.   3. Obesity, Current BMI 45.6 Check fasting labs at next office visit.   Laurie Dominguez is currently in the action stage of change. As such, her goal is to continue with weight loss efforts. She has agreed to the Category 3 Plan.   Exercise goals: All adults should avoid inactivity. Some physical activity is better than none, and adults who participate in any amount of physical activity gain some health benefits.  Behavioral modification strategies:  increasing lean protein intake, decreasing simple carbohydrates, meal planning and cooking strategies, keeping healthy foods in the home, and planning for success.  Laurie Dominguez has agreed to follow-up with our clinic in 2-3 weeks. She was informed of the importance of frequent follow-up visits to maximize her success with intensive lifestyle modifications for her multiple health conditions.   Objective:   Blood pressure (!) 151/80, pulse 77, temperature 98.1 F (36.7 C), height '5\' 2"'$  (1.575 m), weight 249 lb (112.9 kg), SpO2 92 %. Body mass index is 45.54 kg/m.  General: Cooperative, alert, well developed, in no acute distress. HEENT: Conjunctivae and lids unremarkable. Cardiovascular: Regular rhythm.  Lungs: Normal work of breathing. Neurologic: No focal deficits.   Lab Results  Component Value Date   CREATININE 0.80 08/21/2021   BUN 17 08/21/2021   NA 142 08/21/2021   K 3.9 08/21/2021   CL 96 08/21/2021   CO2 30 (H) 08/21/2021   Lab Results  Component Value Date   ALT 8 08/21/2021   AST 8 08/21/2021   ALKPHOS 111 08/21/2021   BILITOT 0.3 08/21/2021   Lab Results  Component Value Date   HGBA1C 5.6 08/21/2021   HGBA1C 5.8 11/08/2020   HGBA1C 5.8 (H) 11/08/2019   HGBA1C 5.8 10/26/2018   HGBA1C 6.1 (H) 04/05/2016   Lab Results  Component Value Date   INSULIN 12.0 08/21/2021   Lab Results  Component Value Date   TSH 4.230 08/21/2021   Lab Results  Component Value Date   CHOL 134 08/21/2021   HDL 41 08/21/2021  LDLCALC 76 08/21/2021   LDLDIRECT 150.5 06/20/2008   TRIG 88 08/21/2021   CHOLHDL 4 04/17/2021   Lab Results  Component Value Date   VD25OH 10.5 (L) 08/21/2021   Lab Results  Component Value Date   WBC 6.3 11/08/2020   HGB 12.3 11/08/2020   HCT 38.1 11/08/2020   MCV 92.0 11/08/2020   PLT 264.0 11/08/2020   No results found for: "IRON", "TIBC", "FERRITIN"  Obesity Behavioral Intervention:   Approximately 15 minutes were spent on the  discussion below.  ASK: We discussed the diagnosis of obesity with Laurie Dominguez today and Laurie Dominguez agreed to give [place] permission to discuss obesity behavioral modification therapy today.  ASSESS: Laurie Dominguez has the diagnosis of obesity and her BMI today is 45.6. Laurie Dominguez is in the action stage of change.   ADVISE: Laurie Dominguez was educated on the multiple health risks of obesity as well as the benefit of weight loss to improve her health. She was advised of the need for long term treatment and the importance of lifestyle modifications to improve her current health and to decrease her risk of future health problems.  AGREE: Multiple dietary modification options and treatment options were discussed and Laurie Dominguez agreed to follow the recommendations documented in the above note.  ARRANGE: Laurie Dominguez was educated on the importance of frequent visits to treat obesity as outlined per CMS and USPSTF guidelines and agreed to schedule her next follow up appointment today.  Attestation Statements:   Reviewed by clinician on day of visit: allergies, medications, problem list, medical history, surgical history, family history, social history, and previous encounter notes.   Time spent on visit including pre-visit chart review and post-visit care and charting was 27 minutes.   I, Davy Pique, RMA, am acting as Location manager for Mina Marble, NP.  I have reviewed the above documentation for accuracy and completeness, and I agree with the above. -  Olusegun Gerstenberger d. Reathel Turi, NP-C

## 2022-01-03 ENCOUNTER — Telehealth: Payer: Self-pay | Admitting: Pharmacist

## 2022-01-03 DIAGNOSIS — J9611 Chronic respiratory failure with hypoxia: Secondary | ICD-10-CM | POA: Diagnosis not present

## 2022-01-03 NOTE — Chronic Care Management (AMB) (Signed)
    Chronic Care Management Pharmacy Assistant   Name: [name]  MRN: [id number] DOB: 09/13/1950  01/06/2022 APPOINTMENT REMINDER  Laurie Dominguez was reminded to have all medications, supplements and any blood glucose and blood pressure readings available for review with Laurie Dominguez, Pharm. D, at her telephone visit on 01/06/2022 at 11:45.  Care Gaps: AWV - scheduled 11/25/2022 Last BP - 151/80 on 12/24/2021 Last A1C - 5.6 on 08/21/2021  Star Rating Drug: Losartan HCTZ 100/25 mg - last filled 10/23/2021 100 DS at [facility] Rosuvastatin 5 mg - last filled 12/21/2021 100 DS at [facility] 0.'25mg'$  - last filled 12/19/2021 28 DS at [facility]  Any gaps in medications fill history? No  [facility]  Catering manager [phone]

## 2022-01-06 ENCOUNTER — Ambulatory Visit (INDEPENDENT_AMBULATORY_CARE_PROVIDER_SITE_OTHER): Payer: Medicare Other | Admitting: Pharmacist

## 2022-01-06 DIAGNOSIS — F32A Depression, unspecified: Secondary | ICD-10-CM

## 2022-01-06 DIAGNOSIS — I10 Essential (primary) hypertension: Secondary | ICD-10-CM

## 2022-01-06 NOTE — Progress Notes (Signed)
Chronic Care Management Pharmacy Note  01/06/2022 Name:  Kila Godina MRN:  [id number] DOB:  1950-08-05  Summary: BP is at goal < 140/90 per home readings Pt is steadily losing weight   Recommendations/Changes made from today's visit: -Recommended tapering to omeprazole every other day per pt request -Recommended supplementing with calcium citrate 600 mg daily -Recommended repeat DEXA   Plan: COPD and BP assessment in 3 months Follow up in 6 months  Subjective: Tomekia Helton is an 71 y.o. year old female who is a primary patient of [name], NP.  The CCM team was consulted for assistance with disease management and care coordination needs.    Engaged with patient face to face for initial visit in response to provider referral for pharmacy case management and/or care coordination services.   Consent to Services:  The patient was given information about Chronic Care Management services, agreed to services, and gave verbal consent prior to initiation of services.  Please see initial visit note for detailed documentation.   Patient Care Team: [name], NP as PCP - General (Family Medicine) Jerline Pain, MD as PCP - Cardiology (Cardiology) Griselda Miner, MD as Consulting Physician (Dermatology) Viona Gilmore, [facility] as Pharmacist (Pharmacist)  Recent office visits: 11/18/21 Glenna Durand, LPN: Patient presented for AWV.  11/12/21 [name] NP: Patient presented for annual exam. Increased Ozempic to 0.5 mg weekly. Ordered cologuard.  Recent consult visits: 12/24/21 Mina Marble, NP (weight management): Patient presented for weight management follow up. Follow up in 3 weeks.  11/26/21 Mina Marble, NP (weight management): Patient presented for weight management follow up. Follow up in 3 weeks.  11/05/21 Mina Marble, NP (weight management): Patient presented for weight management follow up. Follow up in 3-4 weeks.  10/15/21 Mina Marble, NP (weight  management): Patient presented for weight management follow up. Follow up in 3 weeks.  10/01/21 Jearld Lesch, DO (weight management): Patient presented for weight management follow up. Follow up in 3 weeks.  09/04/21 Jearld Lesch, DO (weight management): Patient presented for weight management follow up. Prescribed vitamin D 50,000 once weekly. Follow up in 2-3 weeks.  08/21/21 Jearld Lesch, DO (weight management): Patient presented for weight management initial visit. Follow up in 2 weeks.  02/18/21 Eric Form, NP (pulmonary): Patient presented for lung cancer screening program based on history of smoking.   Hospital visits: None in previous 6 months  Objective:  Lab Results  Component Value Date   CREATININE 0.80 08/21/2021   BUN 17 08/21/2021   GFR 87.00 11/08/2020   GFRNONAA 82 11/08/2019   GFRAA 95 11/08/2019   NA 142 08/21/2021   K 3.9 08/21/2021   CALCIUM 9.3 08/21/2021   CO2 30 (H) 08/21/2021   GLUCOSE 102 (H) 08/21/2021    Lab Results  Component Value Date/Time   HGBA1C 5.6 08/21/2021 12:47 PM   HGBA1C 5.8 11/08/2020 09:38 AM   GFR 87.00 11/08/2020 09:38 AM   GFR 90.27 10/26/2018 10:42 AM    Last diabetic Eye exam: No results found for: "HMDIABEYEEXA"  Last diabetic Foot exam: No results found for: "HMDIABFOOTEX"   Lab Results  Component Value Date   CHOL 134 08/21/2021   HDL 41 08/21/2021   LDLCALC 76 08/21/2021   LDLDIRECT 150.5 06/20/2008   TRIG 88 08/21/2021   CHOLHDL 4 04/17/2021       Latest Ref Rng & Units 08/21/2021   12:47 PM 11/08/2020    9:38 AM 11/08/2019   10:09 AM  Hepatic  Function  Total Protein 6.0 - 8.5 g/dL 6.7  6.8  6.5   Albumin 3.7 - 4.7 g/dL 4.3  3.8    AST 0 - 40 IU/L 8  9  12    ALT 0 - 32 IU/L 8  8  8    Alk Phosphatase 44 - 121 IU/L 111  104    Total Bilirubin 0.0 - 1.2 mg/dL 0.3  0.4  0.5     Lab Results  Component Value Date/Time   TSH 4.230 08/21/2021 12:47 PM   TSH 4.11 11/08/2020 09:38 AM   FREET4 0.88 08/21/2021 12:47  PM       Latest Ref Rng & Units 11/08/2020    9:38 AM 11/08/2019   10:09 AM 10/26/2018   10:42 AM  CBC  WBC 4.0 - 10.5 K/uL 6.3  7.2  7.7   Hemoglobin 12.0 - 15.0 g/dL 12.3  13.1  13.4   Hematocrit 36.0 - 46.0 % 38.1  41.2  40.7   Platelets 150.0 - 400.0 K/uL 264.0  314  333.0     Lab Results  Component Value Date/Time   VD25OH 10.5 (L) 08/21/2021 12:47 PM    Clinical ASCVD: No  The 10-year ASCVD risk score (Arnett DK, et al., 2019) is: 35%   Values used to calculate the score:     Age: 71 years     Sex: Female     Is Non-Hispanic African American: No     Diabetic: Yes     Tobacco smoker: No     Systolic Blood Pressure: 696 mmHg     Is BP treated: Yes     HDL Cholesterol: 41 mg/dL     Total Cholesterol: 134 mg/dL       11/19/2021    8:33 AM 11/12/2021    8:01 AM 08/21/2021    9:05 AM  Depression screen PHQ 2/9  Decreased Interest 0 1 3  Down, Depressed, Hopeless 0 1 3  PHQ - 2 Score 0 2 6  Altered sleeping  1 1  Tired, decreased energy  1 3  Change in appetite  1 1  Feeling bad or failure about yourself   0 3  Trouble concentrating  0 1  Moving slowly or fidgety/restless  0 3  Suicidal thoughts  0 1  PHQ-9 Score  5 19  Difficult doing work/chores  Not difficult at all Extremely dIfficult         No data to display              01/09/2021    4:10 PM  CAT Score  Total CAT Score 11     Social History   Tobacco Use  Smoking Status Former   Packs/day: 2.00   Years: 40.00   Total pack years: 80.00   Types: Cigarettes   Quit date: 03/24/2006   Years since quitting: 15.8  Smokeless Tobacco Never   BP Readings from Last 3 Encounters:  12/24/21 (!) 151/80  11/26/21 122/79  11/12/21 130/80   Pulse Readings from Last 3 Encounters:  12/24/21 77  11/26/21 93  11/12/21 68   Wt Readings from Last 3 Encounters:  12/24/21 249 lb (112.9 kg)  11/26/21 256 lb (116.1 kg)  11/19/21 260 lb (117.9 kg)   BMI Readings from Last 3 Encounters:  12/24/21  45.54 kg/m  11/26/21 46.82 kg/m  11/19/21 47.55 kg/m    Assessment/Interventions: Review of patient past medical history, allergies, medications, health status, including review of consultants reports, laboratory  and other test data, was performed as part of comprehensive evaluation and provision of chronic care management services.   SDOH:  (Social Determinants of Health) assessments and interventions performed: Yes  SDOH Interventions    Flowsheet Row Chronic Care Management from 01/06/2022 in [place] [facility] [place] Management from 06/12/2020 in [place] [facility] [place] from 11/15/2019 in [place] at [place] Interventions -- -- Intervention Not Indicated  Housing Interventions -- -- Intervention Not Indicated  Transportation Interventions -- Intervention Not Indicated Intervention Not Indicated  Depression Interventions/Treatment  -- -- PHQ2-9 Score <4 Follow-up Not Indicated  Financial Strain Interventions Intervention Not Indicated Intervention Not Indicated Intervention Not Indicated  Physical Activity Interventions -- -- Intervention Not Indicated  Stress Interventions -- -- Intervention Not Indicated  Social Connections Interventions -- -- Intervention Not Indicated       CCM Care Plan  Allergies  Allergen Reactions   Codeine Phosphate    Simvastatin     Myalgia     Medications Reviewed Today     Reviewed by Viona Gilmore, [facility] (Pharmacist) on 01/06/22 at 1214  Med List Status: <None>   Medication Order Taking? Sig Documenting Provider Last Dose Status Informant  albuterol (VENTOLIN HFA) 108 (90 Base) MCG/ACT inhaler [id number]  USE 2 INHALATIONS BY MOUTH  EVERY 6 HOURS AS NEEDED FOR WHEEZING OR SHORTNESS OF  BREATH Nafziger, Tommi Rumps, NP  Active   amLODipine (NORVASC) 5 MG tablet [id number] Yes TAKE 1 TABLET BY MOUTH DAILY Nafziger, Tommi Rumps, NP Taking Active    budesonide-formoterol (SYMBICORT) 160-4.5 MCG/ACT inhaler [id number]  Inhale 2 puffs into the lungs 2 (two) times daily. Need office visit for any additional refills Magdalen Spatz, NP  Active   diazepam (VALIUM) 2 MG tablet [id number]  Take 1 tablet (2 mg total) by mouth at bedtime as needed for anxiety or muscle spasms. Nafziger, Tommi Rumps, NP  Active   furosemide (LASIX) 20 MG tablet [id number] No TAKE 1 TABLET BY MOUTH DAILY  Patient not taking: Reported on 01/06/2022   [name], NP Not Taking Active   losartan-hydrochlorothiazide (HYZAAR) 100-25 MG tablet [id number] Yes TAKE 1 TABLET BY MOUTH  DAILY Nafziger, Tommi Rumps, NP Taking Active   meclizine (ANTIVERT) 25 MG tablet [id number]  Take 1 tablet (25 mg total) by mouth daily as needed. Marletta Lor, MD  Active   omeprazole (PRILOSEC) 20 MG capsule [id number] Yes TAKE 1 CAPSULE BY MOUTH  DAILY Nafziger, Tommi Rumps, NP Taking Active   OZEMPIC, 0.25 OR 0.5 MG/DOSE, 2 MG/3ML SOPN [id number] Yes INJECT 0.25 MG INTO THE SKIN ONCE A WEEK Nafziger, Tommi Rumps, NP Taking Active   PARoxetine (PAXIL) 30 MG tablet [id number] Yes TAKE 2 TABLETS BY MOUTH IN  THE MORNING  Patient taking differently: Take 30 mg by mouth daily.   Nafziger, Tommi Rumps, NP Taking Active   rosuvastatin (CRESTOR) 5 MG tablet [id number] Yes TAKE 1 TABLET BY MOUTH  EVERY OTHER DAY Nafziger, Cory, NP Taking Active   Semaglutide,0.25 or 0.5MG /DOS, 2 MG/3ML SOPN [id number]  Inject 0.5 mg into the skin once a week. Nafziger, Tommi Rumps, NP  Active   traMADol (ULTRAM) 50 MG tablet [id number]  TAKE 1 TABLET BY MOUTH  EVERY 6 HOURS AS NEEDED FOR MODERATE PAIN OR SEVERE  PAIN Nafziger, Tommi Rumps, NP  Active   triamcinolone (KENALOG) 0.025 % cream [id number]  Apply 1 application topically 2 (two) times daily. [provider]  Active   triamcinolone (NASACORT)  55 MCG/ACT AERO nasal inhaler [id number]  Place 2 sprays into the nose daily.  Patient taking differently: Place 2 sprays into the nose as needed.   Nafziger,  Tommi Rumps, NP  Active   Vitamin D, Ergocalciferol, (DRISDOL) 1.25 MG (50000 UNIT) CAPS capsule [id number]  Take 1 capsule (50,000 Units total) by mouth every 7 (seven) days. Mina Marble D, NP  Active             Patient Active Problem List   Diagnosis Date Noted   IFG (impaired fasting glucose) 01/01/2022   Vitamin D deficiency 08/27/2021   Shortness of breath 01/28/2021   Pure hypercholesterolemia 01/28/2021   Tricompartment osteoarthritis of right knee 09/20/2020   Acute pain of right knee 09/20/2020   Dependence on supplemental oxygen 09/20/2020   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 ([place]) 03/23/2017   Chronic respiratory failure with hypoxia ([place]) 02/04/2017   Psoriasis 09/26/2016   Obstructive sleep apnea 06/12/2016   COPD mixed type ([place]) 06/12/2016   Obesity hypoventilation syndrome ([place]) 04/24/2016   Hypoxia 04/05/2016   Acute respiratory failure with hypoxia ([place]) 04/05/2016   Impaired glucose tolerance 04/11/2014   Class 3 severe obesity with serious comorbidity and body mass index (BMI) of 45.0 to 49.9 in adult ([place]) 04/11/2014   Depression, recurrent ([place]) 12/21/2006   Essential hypertension 12/21/2006   GERD 12/21/2006   NEPHROLITHIASIS, HX OF 12/21/2006    Immunization History  Administered Date(s) Administered   Fluad Quad(high Dose 65+) 01/02/2021, 12/13/2021   Influenza Split 01/14/2012, 02/02/2013, 01/19/2017   Influenza Whole 01/05/1996   Influenza, High Dose Seasonal PF 02/12/2015, 12/06/2015, 12/29/2017, 12/22/2018, 12/22/2019   Influenza,inj,Quad PF,6+ Mos 01/12/2014   Influenza-Unspecified 12/27/2020   PFIZER Comirnaty(Gray Top)Covid-19 Tri-Sucrose Vaccine 12/13/2021   PFIZER(Purple Top)SARS-COV-2 Vaccination 05/16/2019, 06/06/2019, 01/05/2020   Pfizer Covid-19 Vaccine Bivalent Booster 52yr & up 12/06/2020   Pneumococcal Conjugate-13 09/26/2015, 12/22/2016   Pneumococcal Polysaccharide-23 09/28/2017, 12/22/2018   Tdap  10/09/2010, 11/12/2021   Zoster Recombinat (Shingrix) 09/28/2017, 12/29/2017   Patient has been working on her diet so far.   Patient has had an upset stomach but she thinks it might be from her gallbladder as she has had issues before the Ozempic. She notices that when she doesn't eat until late in the afternoon.  She does have some diarrhea.  Patient has been eating less and being more selective with what changes. Food is no longer the center of her universe with this medication. She isn't very hungry in the morning. She did have one [employer]ut but usually has more. She has noticed that she is good with eating less citrus and doing more strawberries and apples. She doesn't eat as much pasta.  Patient cannot do exercises right now with her limited mobility.  Patient reports she does have cottage cheese pretty much every day, a little bit of cheese, and no milk.    Conditions to be addressed/monitored:  Hypertension, Hyperlipidemia, GERD, COPD, Depression and prediabetes, psoriasis and pain  Conditions addressed this visit: Prediabetes, hypertension, hyperlipidemia  Care Plan : CCM Pharmacy Care Plan  Updates made by PViona Gilmore RLyndonsince 01/06/2022 12:00 AM     Problem: Problem: Hypertension, Hyperlipidemia, GERD, COPD, Depression and prediabetes, psoriasis and pain      Long-Range Goal: Patient-Specific Goal   Start Date: 06/12/2020  Expected End Date: 06/12/2021  Recent Progress: On track  Priority: High  Note:   Current Barriers:  Unable to independently monitor therapeutic efficacy  Pharmacist Clinical Goal(s):  Patient will achieve adherence to monitoring guidelines and medication adherence to achieve therapeutic efficacy through collaboration with PharmD and provider.   Interventions: 1:1 collaboration with [name], NP regarding development and update of comprehensive plan of care as evidenced by provider attestation and  co-signature Inter-disciplinary care team collaboration (see longitudinal plan of care) Comprehensive medication review performed; medication list updated in electronic medical record  Hypertension (BP goal <140/90) -Controlled -Current treatment: Amlodipine 5 mg 1 tablet daily - Appropriate, Query effective, Safe, Accessible Losartan-HCTZ 100-25 mg 1 tablet daily - Appropriate, Query effective, Safe, Accessible -Medications previously tried: none  -Current home readings: 120-125/60s (checking every day) - wrist cuff; hasn't found one to fit her arm; did compare her wrist cuff to the office one -Current dietary habits: brought out her bands  -Current exercise habits: husband limits her salt intake; using regular salt right now on accidents; not at all eating out  -Reports hypotensive/hypertensive symptoms -Educated on Exercise goal of 150 minutes per week; Importance of home blood pressure monitoring; Symptoms of hypotension and importance of maintaining adequate hydration; -Counseled to monitor BP at home at least weekly, document, and provide log at future appointments -Counseled on diet and exercise extensively Recommended to continue current medication  Hyperlipidemia: (LDL goal < 70) -Not ideally controlled -Current treatment: Rosuvastatin 5 mg every other day - Appropriate, Query effective, Safe, Accessible -Medications previously tried: simvastatin (side effects)  -Current dietary patterns: not frying foods often; enjoys vegetables -Current exercise habits: not exercising -Educated on Cholesterol goals;  Importance of limiting foods high in cholesterol; Exercise goal of 150 minutes per week; -Counseled on diet and exercise extensively Recommended to continue current medication Educated on aortic atherosclerosis. Consider increasing statin therapy to target lower LDL.  Pre-diabetes (A1c goal <6.5%) -Controlled -Current medications: Ozempic 0.5 mg inject once weekly -  Appropriate, Effective, Safe, Accessible -Medications previously tried: none  -Current home glucose readings fasting glucose: n/a post prandial glucose: n/a -Denies hypoglycemic/hyperglycemic symptoms -Current meal patterns:  breakfast: n/a  lunch: n/a  dinner: n/a snacks: n/a drinks: n/a -Current exercise: does not exercise -Educated on A1c and blood sugar goals; Exercise goal of 150 minutes per week; Carbohydrate counting and/or plate method -Counseled to check feet daily and get yearly eye exams -Counseled on diet and exercise extensively Educated on GLP1 for weight loss.  COPD (Goal: control symptoms and prevent exacerbations) -Not ideally controlled -Current treatment  Symbicort 160-4.5 mcg/act 2 puffs twice daily - Query Appropriate, Effective, Safe, Accessible Albuterol HFA 2 puffs every 6 hours as needed - Appropriate, Effective, Safe, Accessible -Medications previously tried: none  -Gold Grade: Gold 2 (FEV1 50-79%) -Current COPD Classification:  B (high sx, <2 exacerbations/yr) -MMRC/CAT score: n/a -Pulmonary function testing: n/a -Exacerbations requiring treatment in last 6 months: none -Patient denies consistent use of maintenance inhaler -Frequency of rescue inhaler use: as needed -Counseled on Proper inhaler technique; Benefits of consistent maintenance inhaler use Differences between maintenance and rescue inhalers -Recommended to continue current medication Assessed patient finances. Patient may qualify for PAP.  Depression/Anxiety (Goal: minimize symptoms and prevent crying) -Controlled -Current treatment: Paroxetine 30 mg 1 tablet daily in the morning - Appropriate, Effective, Safe, Accessible Diazepam 2 mg 1 tablet at bedtime as needed - Appropriate, Effective, Query Safe, Accessible -Medications previously tried/failed: none -PHQ9: 0 -GAD7: n/a -Educated on Benefits of medication for symptom control Benefits of cognitive-behavioral therapy with or  without medication -Recommended to continue current medication  GERD (Goal: minimize symptoms of acid  reflux) -Not ideally controlled -Current treatment  Omeprazole 20 mg 1 capsule daily - Appropriate, Effective, Safe, Accessible -Medications previously tried: none -Counseled on non-pharmacologic management of symptoms such as elevating the head of your bed, avoiding eating 2-3 hours before bed, avoiding triggering foods such as acidic, spicy, or fatty foods, eating smaller meals, and wearing clothes that are loose around the waist Recommended trying every other day to see if she truly needs it  Edema (Goal: minimize ankle swelling) -Controlled -Current treatment  Furosemide 20 mg 1 tablet daily as needed -  Appropriate, Effective, Safe, Accessible -Medications previously tried: none  -Recommended to continue current medication Educated on limiting salt intake and possibly off balancing the amlodipine side effect  Allergic rhinitis (Goal: minimize symptoms) -Controlled -Current treatment  Flonase 50 mcg/act 2 sprays in both nostrils daily as needed - Appropriate, Effective, Safe, Accessible Mucinex 12 hour 1 tablet at bedtime - Appropriate, Effective, Safe, Accessible -Medications previously tried: none  -Recommended to continue current medication  Pain (Goal: minimize symptoms) -Controlled -Current treatment  Tramadol 50 mg 1 tablet every 6 hours as needed - Appropriate, Effective, Safe, Accessible Naproxen 500 mg 1 tablet twice daily with meals as needed- Appropriate, Effective, Safe, Accessible -Medications previously tried: n/a  -Counseled on increased risk for fluid retention with naproxen use. Recommended Voltaren gel (diclofenac gel) for arthritis.  Osteopenia (Goal prevent fractures) -Uncontrolled -Last DEXA Scan: 01/06/18   T-Score femoral neck: -2.2  T-Score total hip: n/a  T-Score lumbar spine: n/a  T-Score forearm radius: 0.7  10-year probability of major  osteoporotic fracture: 10.6%  10-year probability of hip fracture: 2.0% -Patient is not a candidate for pharmacologic treatment -Current treatment  Vitamin D 50,000 units 1 capsule once weekly - Appropriate, Query effective, Safe, Accessible -Medications previously tried: none  -Recommend [phone] units of vitamin D daily. Recommend 1200 mg of calcium daily from dietary and supplemental sources. Recommend weight-bearing and muscle strengthening exercises for building and maintaining bone density. -Recommended repeat DEXA. Recommended supplementing with calcium citrate 600 mg daily.  Health Maintenance -Vaccine gaps: none -Current therapy:  None -Educated on Cost vs benefit of each product must be carefully weighed by individual consumer -Patient is satisfied with current therapy and denies issues -Counseled on getting recommended amounts of calcium and vitamin D daily  Patient Goals/Self-Care Activities Patient will:  - take medications as prescribed check blood pressure weekly, document, and provide at future appointments target a minimum of 150 minutes of moderate intensity exercise weekly engage in dietary modifications by increasing fiber intake  Follow Up Plan: Telephone follow up appointment with care management team member scheduled for: 6 months      Medication Assistance: None required.  Patient affirms current coverage meets needs.  Compliance/Adherence/Medication fill history: Care Gaps: Urine microalbumin Last BP - 151/80 on 12/24/2021 Last A1C - 5.6 on 08/21/2021   Star-Rating Drugs: Losartan HCTZ 100/25 mg - last filled 10/23/2021 100 DS at [facility] Rosuvastatin 5 mg - last filled 12/21/2021 100 DS at [facility] 0.62m - last [facility]19/2021 [facility] Patient's preferred pharmacy is:  [address] [place]Phone: [phone]: [phone] [facility]  [facility] [address]Phone: [phone]: [phone] [id number] [address] [place] [address] Phone: [phone] Fax: [place], [place] [address] [place] [address] Phone: [phone] Fax: [phone]   Uses pill box? Yes Pt endorses 90% compliance - not taking Symbicort scheduled  We discussed: Current pharmacy is preferred with insurance plan and patient is satisfied with pharmacy services Patient decided to: Continue current medication management strategy  Care Plan and Follow Up Patient Decision:  Patient agrees to Care Plan and Follow-up.  Plan: The care management team will reach out to the patient again over the next 30 days.  Jeni Salles, PharmD [facility] Clinical Pharmacist [place] [facility] [place]

## 2022-01-06 NOTE — Patient Instructions (Signed)
Hi Danaly,  It was great to catch up with you again! Please go ahead and start on the calcium citrate 600 mg daily like we discussed.  Also, go ahead and decrease to every other day on your omeprazole for a little while to see if you even need it anymore.  Please reach out to me if you have any questions or need anything before our follow up!  Best, Maddie  Jeni Salles, PharmD, [place] Pharmacist [place] [facility] [place]

## 2022-01-07 ENCOUNTER — Other Ambulatory Visit: Payer: Self-pay | Admitting: Adult Health

## 2022-01-07 DIAGNOSIS — M8589 Other specified disorders of bone density and structure, multiple sites: Secondary | ICD-10-CM

## 2022-01-10 ENCOUNTER — Telehealth: Payer: Self-pay | Admitting: Adult Health

## 2022-01-10 NOTE — Telephone Encounter (Signed)
Patient notified of update  and verbalized understanding. 

## 2022-01-10 NOTE — Telephone Encounter (Signed)
Pt husband is calling and wife is in donut hole and would like samples of ozempic 0.5 mg

## 2022-01-10 NOTE — Telephone Encounter (Signed)
Please advise 

## 2022-01-15 ENCOUNTER — Telehealth: Payer: Self-pay | Admitting: Pharmacist

## 2022-01-15 NOTE — Chronic Care Management (AMB) (Signed)
    Chronic Care Management Pharmacy Assistant   Name: Laurie Dominguez  MRN: [id number] DOB: [birth year]  Reason for Encounter: Patient Assistance renewal for Ozempic  Patient assistance application for 6386 Ozempic renewal completed, to be mailed to patient.    [place] Pharmacist Assistant [phone]

## 2022-01-21 DIAGNOSIS — I10 Essential (primary) hypertension: Secondary | ICD-10-CM | POA: Diagnosis not present

## 2022-01-21 DIAGNOSIS — E785 Hyperlipidemia, unspecified: Secondary | ICD-10-CM | POA: Diagnosis not present

## 2022-01-21 DIAGNOSIS — J449 Chronic obstructive pulmonary disease, unspecified: Secondary | ICD-10-CM | POA: Diagnosis not present

## 2022-01-21 DIAGNOSIS — F32A Depression, unspecified: Secondary | ICD-10-CM | POA: Diagnosis not present

## 2022-01-22 ENCOUNTER — Ambulatory Visit (INDEPENDENT_AMBULATORY_CARE_PROVIDER_SITE_OTHER): Payer: Medicare Other | Admitting: Adult Health

## 2022-01-22 ENCOUNTER — Telehealth: Payer: Self-pay | Admitting: Adult Health

## 2022-01-22 ENCOUNTER — Encounter (INDEPENDENT_AMBULATORY_CARE_PROVIDER_SITE_OTHER): Payer: Self-pay | Admitting: Adult Health

## 2022-01-22 VITALS — BP 134/86 | HR 83 | Temp 98.1°F | Ht 62.0 in | Wt 247.0 lb

## 2022-01-22 DIAGNOSIS — Z6841 Body Mass Index (BMI) 40.0 and over, adult: Secondary | ICD-10-CM | POA: Diagnosis not present

## 2022-01-22 DIAGNOSIS — E669 Obesity, unspecified: Secondary | ICD-10-CM | POA: Diagnosis not present

## 2022-01-22 DIAGNOSIS — R7301 Impaired fasting glucose: Secondary | ICD-10-CM | POA: Diagnosis not present

## 2022-01-22 DIAGNOSIS — E559 Vitamin D deficiency, unspecified: Secondary | ICD-10-CM | POA: Diagnosis not present

## 2022-01-22 MED ORDER — VITAMIN D (ERGOCALCIFEROL) 1.25 MG (50000 UNIT) PO CAPS: 50000.0000 [IU] | ORAL_CAPSULE | ORAL | 0 refills | Status: DC

## 2022-01-22 NOTE — Telephone Encounter (Signed)
Noted  

## 2022-01-22 NOTE — Telephone Encounter (Signed)
Pt dropped off [place] ppwk to be signed by Tommi Rumps; form has been placed in folder.   Please advise.

## 2022-01-23 LAB — COMPREHENSIVE METABOLIC PANEL
ALT: 3 IU/L (ref 0–32)
AST: 9 IU/L (ref 0–40)
Albumin/Globulin Ratio: 1.4 (ref 1.2–2.2)
Albumin: 4.1 g/dL (ref 3.8–4.8)
Alkaline Phosphatase: 98 IU/L (ref 44–121)
BUN/Creatinine Ratio: 19 (ref 12–28)
BUN: 13 mg/dL (ref 8–27)
Bilirubin Total: 0.2 mg/dL (ref 0.0–1.2)
CO2: 33 mmol/L — ABNORMAL HIGH (ref 20–29)
Calcium: 9.5 mg/dL (ref 8.7–10.3)
Chloride: 98 mmol/L (ref 96–106)
Creatinine, Ser: 0.67 mg/dL (ref 0.57–1.00)
Globulin, Total: 2.9 g/dL (ref 1.5–4.5)
Glucose: 97 mg/dL (ref 70–99)
Potassium: 3.8 mmol/L (ref 3.5–5.2)
Sodium: 145 mmol/L — ABNORMAL HIGH (ref 134–144)
Total Protein: 7 g/dL (ref 6.0–8.5)
eGFR: 93 mL/min/{1.73_m2} (ref >59)

## 2022-01-23 LAB — INSULIN, RANDOM: INSULIN: 11.5 u[IU]/mL (ref 2.6–24.9)

## 2022-01-23 LAB — HEMOGLOBIN A1C
Est. average glucose Bld gHb Est-mCnc: 108 mg/dL
Hgb A1c MFr Bld: 5.4 % (ref 4.8–5.6)

## 2022-01-23 LAB — VITAMIN D 25 HYDROXY (VIT D DEFICIENCY, FRACTURES): Vit D, 25-Hydroxy: 53.8 ng/mL (ref 30.0–100.0)

## 2022-01-24 ENCOUNTER — Ambulatory Visit
Admission: RE | Admit: 2022-01-24 | Discharge: 2022-01-24 | Disposition: A | Discharge status: Home / Self Care | Payer: Medicare Other | Source: Ambulatory Visit | Attending: Adult Health | Admitting: Adult Health

## 2022-01-24 DIAGNOSIS — Z87891 Personal history of nicotine dependence: Secondary | ICD-10-CM | POA: Diagnosis not present

## 2022-01-24 DIAGNOSIS — E041 Nontoxic single thyroid nodule: Secondary | ICD-10-CM | POA: Diagnosis not present

## 2022-01-24 DIAGNOSIS — J432 Centrilobular emphysema: Secondary | ICD-10-CM | POA: Diagnosis not present

## 2022-01-24 DIAGNOSIS — I2510 Atherosclerotic heart disease of native coronary artery without angina pectoris: Secondary | ICD-10-CM | POA: Diagnosis not present

## 2022-01-27 NOTE — Progress Notes (Signed)
Chief Complaint:   OBESITY Laurie Dominguez is here to discuss her progress with her obesity treatment plan along with follow-up of her obesity related diagnoses. Laurie Dominguez is on the Category 3 Plan and states she is following her eating plan approximately 90% of the time. Laurie Dominguez states she is doing chair yoga.   Today's visit was #: 8 Starting weight: 269 lbs Starting date: 08/21/2021 Today's weight: 247 lbs Today's date: 01/22/2022 Total lbs lost to date: 22 lbs Total lbs lost since last in-office visit: 2 lbs  Interim History:  Per patient, she has $4500 annually covered for prescription coverage.   Patient has exhausted all Rx funds at present. PCP provides Ozempic prescription.   he is currently on Ozempic 0.5 mg, she has missed one dose due to prescription cost.    Subjective:   1. IFG (impaired fasting glucose) PCP manages Ozempic 0.5 mg, missed last dose due to medications cost.  Lab Results  Component Value Date   HGBA1C 5.4 01/22/2022   HGBA1C 5.6 08/21/2021   HGBA1C 5.8 11/08/2020   08/21/21- BG reading of 102  2. Vitamin D deficiency 08/21/2021, Vitamin D level 10.5.   Well below goal of 50-70.   She is on weekly Ergocalciferol- denies N/V/Muscle Weakness.  Assessment/Plan:   1. IFG (impaired fasting glucose) Check labs today.   - Hemoglobin A1c - Insulin, random - Comprehensive metabolic panel  2. Vitamin D deficiency Check labs today.   Refill - Vitamin D, Ergocalciferol, (DRISDOL) 1.25 MG (50000 UNIT) CAPS capsule; Take 1 capsule (50,000 Units total) by mouth every 7 (seven) days.  Dispense: 4 capsule; Refill: 0 - VITAMIN D 25 Hydroxy (Vit-D Deficiency, Fractures)  3. Obesity, Current BMI 45.2 Provided patient with with [employer] patient assistance program information.   [name] is currently in the action stage of change. As such, her goal is to continue with weight loss efforts. She has agreed to the Category 3 Plan.   Exercise goals:  As  is.   Behavioral modification strategies: increasing lean protein intake, decreasing simple carbohydrates, meal planning and cooking strategies, keeping healthy foods in the home, and planning for success.  Laurie Dominguez has agreed to follow-up with our clinic in 4 weeks. She was informed of the importance of frequent follow-up visits to maximize her success with intensive lifestyle modifications for her multiple health conditions.   Laurie Dominguez was informed we would discuss her lab results at her next visit unless there is a critical issue that needs to be addressed sooner. Laurie Dominguez agreed to keep her next visit at the agreed upon time to discuss these results.  Objective:   Blood pressure 134/86, pulse 83, temperature 98.1 F (36.7 C), height '5\' 2"'$  (1.575 m), weight 247 lb (112 kg), SpO2 92 %. Body mass index is 45.18 kg/m.  General: Cooperative, alert, well developed, in no acute distress. HEENT: Conjunctivae and lids unremarkable. Cardiovascular: Regular rhythm.  Lungs: Normal work of breathing. Neurologic: No focal deficits.   Lab Results  Component Value Date   CREATININE 0.67 01/22/2022   BUN 13 01/22/2022   NA 145 (H) 01/22/2022   K 3.8 01/22/2022   CL 98 01/22/2022   CO2 33 (H) 01/22/2022   Lab Results  Component Value Date   ALT 3 01/22/2022   AST 9 01/22/2022   ALKPHOS 98 01/22/2022   BILITOT 0.2 01/22/2022   Lab Results  Component Value Date   HGBA1C 5.4 01/22/2022   HGBA1C 5.6 08/21/2021   HGBA1C 5.8 11/08/2020  HGBA1C 5.8 (H) 11/08/2019   HGBA1C 5.8 10/26/2018   Lab Results  Component Value Date   INSULIN 11.5 01/22/2022   INSULIN 12.0 08/21/2021   Lab Results  Component Value Date   TSH 4.230 08/21/2021   Lab Results  Component Value Date   CHOL 134 08/21/2021   HDL 41 08/21/2021   LDLCALC 76 08/21/2021   LDLDIRECT 150.5 06/20/2008   TRIG 88 08/21/2021   CHOLHDL 4 04/17/2021   Lab Results  Component Value Date   VD25OH 53.8 01/22/2022    VD25OH 10.5 (L) 08/21/2021   Lab Results  Component Value Date   WBC 6.3 11/08/2020   HGB 12.3 11/08/2020   HCT 38.1 11/08/2020   MCV 92.0 11/08/2020   PLT 264.0 11/08/2020   No results found for: "IRON", "TIBC", "FERRITIN"  Attestation Statements:   Reviewed by clinician on day of visit: allergies, medications, problem list, medical history, surgical history, family history, social history, and previous encounter notes.  I, Davy Pique, RMA, am acting as Location manager for Mina Marble, NP.  I have reviewed the above documentation for accuracy and completeness, and I agree with the above. -  Eiman Maret d. Hajar Penninger, NP-C

## 2022-01-29 ENCOUNTER — Other Ambulatory Visit: Payer: Self-pay | Admitting: Adult Health

## 2022-01-29 ENCOUNTER — Telehealth: Payer: Self-pay | Admitting: Acute Care

## 2022-01-29 ENCOUNTER — Other Ambulatory Visit: Payer: Self-pay

## 2022-01-29 DIAGNOSIS — J449 Chronic obstructive pulmonary disease, unspecified: Secondary | ICD-10-CM

## 2022-01-29 DIAGNOSIS — I10 Essential (primary) hypertension: Secondary | ICD-10-CM

## 2022-01-29 NOTE — Telephone Encounter (Signed)
Pt returned call about the results of her recent CT. Routing to lung nodule pool.

## 2022-01-29 NOTE — Telephone Encounter (Signed)
Reviewed results of LDCT with patient by phone, using two patient identifiers.  No suspicious findings for lung cancer.  Emphysema, atherosclerosis, mild cirrhosis and pulmonary artery hypertension all noted again, as seen in previous scans.  Patient regularly sees PCP and cardiology.  New finding for thyroid nodule with recommendation for ultrasound follow up.  Patient has no known previous history of thyroid problems.  Results have been faxed to PCP with note to follow up with patient regarding the thyroid finding.  Patient agrees to contact PCP office is she has not heard from them within two weeks to discuss thyroid nodule.  Patient acknowledged understanding and had no further questions.  Confirmed this is patient's 15 year mark as non-smoker and we would no longer be following her yearly LDCT.  She can discuss with PCP for recommendations for 2024 and beyond.

## 2022-01-29 NOTE — Telephone Encounter (Signed)
Unable to reach by phone to review results of LDCT.  Left VM and call back number

## 2022-02-03 DIAGNOSIS — J9611 Chronic respiratory failure with hypoxia: Secondary | ICD-10-CM | POA: Diagnosis not present

## 2022-02-06 ENCOUNTER — Other Ambulatory Visit: Payer: Self-pay

## 2022-02-11 DIAGNOSIS — H18413 Arcus senilis, bilateral: Secondary | ICD-10-CM | POA: Diagnosis not present

## 2022-02-11 DIAGNOSIS — H2513 Age-related nuclear cataract, bilateral: Secondary | ICD-10-CM | POA: Diagnosis not present

## 2022-02-11 DIAGNOSIS — H25043 Posterior subcapsular polar age-related cataract, bilateral: Secondary | ICD-10-CM | POA: Diagnosis not present

## 2022-02-11 DIAGNOSIS — H25013 Cortical age-related cataract, bilateral: Secondary | ICD-10-CM | POA: Diagnosis not present

## 2022-02-19 ENCOUNTER — Telehealth: Payer: Self-pay

## 2022-02-19 NOTE — Progress Notes (Signed)
  Chronic Care Management   Note  02/19/2022 Name: Laurie Dominguez MRN: [id number] DOB: [birth year]  Laurie Dominguez is a 71 y.o. year old female who is a primary care patient of [name], NP. I reached out to Laurie Dominguez by phone today in response to a referral sent by Laurie Dominguez's PCP.  Laurie Dominguez  agreedto scheduling an appointment with the CCM RN Case Manager   Follow up plan: Patient agreed to scheduled appointment with RN Case Manager on 02/28/2022(date/time).   [name], [place], [place] [address] Direct Dial: [phone] [email or web address]

## 2022-02-24 ENCOUNTER — Encounter (INDEPENDENT_AMBULATORY_CARE_PROVIDER_SITE_OTHER): Payer: Self-pay | Admitting: Family Medicine

## 2022-02-24 ENCOUNTER — Ambulatory Visit (INDEPENDENT_AMBULATORY_CARE_PROVIDER_SITE_OTHER): Payer: Medicare Other | Admitting: Family Medicine

## 2022-02-24 ENCOUNTER — Ambulatory Visit (INDEPENDENT_AMBULATORY_CARE_PROVIDER_SITE_OTHER): Payer: Medicare Other | Admitting: Adult Health

## 2022-02-24 VITALS — BP 113/65 | HR 96 | Temp 98.0°F | Ht 62.0 in | Wt 246.0 lb

## 2022-02-24 DIAGNOSIS — E1169 Type 2 diabetes mellitus with other specified complication: Secondary | ICD-10-CM | POA: Diagnosis not present

## 2022-02-24 DIAGNOSIS — E559 Vitamin D deficiency, unspecified: Secondary | ICD-10-CM

## 2022-02-24 DIAGNOSIS — Z7985 Long-term (current) use of injectable non-insulin antidiabetic drugs: Secondary | ICD-10-CM

## 2022-02-24 DIAGNOSIS — Z794 Long term (current) use of insulin: Secondary | ICD-10-CM

## 2022-02-24 DIAGNOSIS — E669 Obesity, unspecified: Secondary | ICD-10-CM

## 2022-02-24 DIAGNOSIS — Z6841 Body Mass Index (BMI) 40.0 and over, adult: Secondary | ICD-10-CM | POA: Diagnosis not present

## 2022-02-26 NOTE — Progress Notes (Signed)
Spoke with Zane at [employer], patient is approved for Ozempic through 03/24/2023. The order will process and ship 03/24/22 allow 10-14 days for delivery. Medication will deliver to the office. Patient notified.

## 2022-02-28 ENCOUNTER — Ambulatory Visit (INDEPENDENT_AMBULATORY_CARE_PROVIDER_SITE_OTHER): Payer: Medicare Other

## 2022-02-28 ENCOUNTER — Other Ambulatory Visit (INDEPENDENT_AMBULATORY_CARE_PROVIDER_SITE_OTHER): Payer: Self-pay | Admitting: Adult Health

## 2022-02-28 DIAGNOSIS — I10 Essential (primary) hypertension: Secondary | ICD-10-CM

## 2022-02-28 DIAGNOSIS — J449 Chronic obstructive pulmonary disease, unspecified: Secondary | ICD-10-CM

## 2022-02-28 DIAGNOSIS — E559 Vitamin D deficiency, unspecified: Secondary | ICD-10-CM

## 2022-02-28 NOTE — Patient Instructions (Signed)
Thank you for allowing the Chronic Care Management team to participate in your care. It was great speaking with you!  Our next appointment is scheduled for 04/17/21 at 1100. Please do not hesitate to call if you require assistance prior to our next outreach. Please call the care guide team at [phone] if you need to cancel or reschedule your appointment.    Following is a copy of your full provider care plan:   Goals Addressed             This Visit's Progress    Goal: CCM (COPD) Expected Outcome: Monitor, Self-Manage and Reduce Symptoms of Chronic Obstructive Pulmonary Disease       Current Barriers:  Chronic Disease Management support and education needs related to COPD  Planned Interventions: Reviewed provider's plan for management or COPD Reviewed medications. Reports taking medications and using inhalers as prescribed. Denies current concerns regarding medication management. Agreed to update the care management team with concerns related to prescription cost. Discussed known triggers. Reports symptoms have been well controlled. Experiences occasional sinus congestion in the morning. Denies episodes of shortness of breath. Currently using continuous O2 at 2  to 3L. Reports oxygen saturation of 95 to 98%. Reviewed current action plan and reinforced importance of daily self-assessment. Advised to make an appointment with provider if experiencing moderate symptoms for greater than 48 hours without improvement.  Provided information regarding infection prevention and increased risk r/t COPD. Advised to utilize prevention strategies to reduce risk of respiratory infection.  Reviewed worsening symptoms that require immediate medical attention.   Symptom Management: Attend medical appointments as scheduled Take medications and use inhalers as prescribed Identify and remove indoor air pollutants Limit outdoor activity during cold weather Assess symptoms daily Follow rescue plan if  symptoms flare-up Contact provider for new symptoms or concerns  Follow Up Plan:  Will follow up next month       Goal: CCM (Hypertension) Expected Outcome: Monitor, Self-Manage and Reduce Symptoms of Hypertension       Current Barriers:  Chronic Disease Management support and education needs related to Hypertension  Planned Interventions: Reviewed provider's plan for hypertension management. Reviewed medications and indications for use. Reports taking all medications as prescribed. Denies current concerns regarding medication management or prescription cost. Provided information regarding established blood pressure parameters along with indications for notifying a provider. Reports systolic readings have ranged in the 120s. Reports diastolic readings have ranged in the 50s and 60s. Advised to continue monitoring BP and record readings.  Discussed compliance with recommended cardiac prudent diet. Encouraged to read nutrition labels, monitor sodium intake, and avoid highly processed foods when possible. Discussed complications of uncontrolled blood pressure.  Reviewed s/sx of heart attack, stroke and worsening symptoms that require immediate medical attention.   BP Readings from Last 3 Encounters:  02/24/22 113/65  01/22/22 134/86  12/24/21 (!) 151/80    Symptom Management: Take medications as prescribed   Attend all scheduled provider appointments Call pharmacy for medication refills 3-7 days in advance of running out of medications Check blood pressure and record readings Continue eating whole grains, fruits and vegetables, lean meats and healthy fats Limit sodium intake Engage in low impact activity as tolerated Call provider office for new concerns or questions    Follow Up Plan:  Will follow up next month            [name] understanding of information, instructions and care plan provided today. Agrees to view in MyChart.  A member of the care  management team will follow up next month     [place] Manager/Chronic Care Management [phone]

## 2022-03-03 ENCOUNTER — Telehealth (INDEPENDENT_AMBULATORY_CARE_PROVIDER_SITE_OTHER): Payer: Self-pay | Admitting: Family Medicine

## 2022-03-03 NOTE — Telephone Encounter (Signed)
Patient called and stated her Vitamin D was supposed to be called into [facility].  She needs the prescription called in.

## 2022-03-04 DIAGNOSIS — E119 Type 2 diabetes mellitus without complications: Secondary | ICD-10-CM | POA: Insufficient documentation

## 2022-03-04 MED ORDER — VITAMIN D (ERGOCALCIFEROL) 1.25 MG (50000 UNIT) PO CAPS: 50000.0000 [IU] | ORAL_CAPSULE | ORAL | 0 refills | Status: DC

## 2022-03-05 DIAGNOSIS — J9611 Chronic respiratory failure with hypoxia: Secondary | ICD-10-CM | POA: Diagnosis not present

## 2022-03-05 NOTE — Chronic Care Management (AMB) (Signed)
Chronic Care Management   CCM RN Visit Note   Name: Laurie Dominguez MRN: [id number] DOB: Nov 28, 1950  Subjective: Laurie Dominguez is a 71 y.o. year old female who is a primary care patient of [name], NP. The patient was referred to the Chronic Care Management team for assistance with care management needs subsequent to provider initiation of CCM services and plan of care.    Today's Visit:  Engaged with patient by telephone for initial visit.     SDOH Interventions Today    Flowsheet Row Most Recent Value  SDOH Interventions   Food Insecurity Interventions Intervention Not Indicated  Housing Interventions Intervention Not Indicated  Transportation Interventions Intervention Not Indicated  Utilities Interventions Intervention Not Indicated  Alcohol Usage Interventions Intervention Not Indicated (Score <7)  Financial Strain Interventions Intervention Not Indicated  Stress Interventions Intervention Not Indicated  Social Connections Interventions Intervention Not Indicated         Goals Addressed             This Visit's Progress    Goal: CCM (COPD) Expected Outcome: Monitor, Self-Manage and Reduce Symptoms of Chronic Obstructive Pulmonary Disease       Current Barriers:  Chronic Disease Management support and education needs related to COPD  Planned Interventions: Reviewed provider's plan for management or COPD Reviewed medications. Reports taking medications and using inhalers as prescribed. Denies current concerns regarding medication management. Agreed to update the care management team with concerns related to prescription cost. Discussed known triggers. Reports symptoms have been well controlled. Experiences occasional sinus congestion in the morning. Denies episodes of shortness of breath. Currently using continuous O2 at 2  to 3L. Reports oxygen saturation of 95 to 98%. Reviewed current action plan and reinforced importance of daily self-assessment.  Advised to make an appointment with provider if experiencing moderate symptoms for greater than 48 hours without improvement.  Provided information regarding infection prevention and increased risk r/t COPD. Advised to utilize prevention strategies to reduce risk of respiratory infection.  Reviewed worsening symptoms that require immediate medical attention.   Symptom Management: Attend medical appointments as scheduled Take medications and use inhalers as prescribed Identify and remove indoor air pollutants Limit outdoor activity during cold weather Assess symptoms daily Follow rescue plan if symptoms flare-up Contact provider for new symptoms or concerns  Follow Up Plan:  Will follow up next month       Goal: CCM (Hypertension) Expected Outcome: Monitor, Self-Manage and Reduce Symptoms of Hypertension       Current Barriers:  Chronic Disease Management support and education needs related to Hypertension  Planned Interventions: Reviewed provider's plan for hypertension management. Reviewed medications and indications for use. Reports taking all medications as prescribed. Denies current concerns regarding medication management or prescription cost. Provided information regarding established blood pressure parameters along with indications for notifying a provider. Reports systolic readings have ranged in the 120s. Reports diastolic readings have ranged in the 50s and 60s. Advised to continue monitoring BP and record readings.  Discussed compliance with recommended cardiac prudent diet. Encouraged to read nutrition labels, monitor sodium intake, and avoid highly processed foods when possible. Discussed complications of uncontrolled blood pressure.  Reviewed s/sx of heart attack, stroke and worsening symptoms that require immediate medical attention.   BP Readings from Last 3 Encounters:  02/24/22 113/65  01/22/22 134/86  12/24/21 (!) 151/80    Symptom Management: Take medications as  prescribed   Attend all scheduled provider appointments Call pharmacy for medication refills 3-7  days in advance of running out of medications Check blood pressure and record readings Continue eating whole grains, fruits and vegetables, lean meats and healthy fats Limit sodium intake Engage in low impact activity as tolerated Call provider office for new concerns or questions    Follow Up Plan:  Will follow up next month          Plan: Will follow up next month  [place] Manager/Chronic Care Management [phone]

## 2022-03-05 NOTE — Plan of Care (Signed)
Chronic Care Management Provider Comprehensive Care Plan     Name: Laurie Dominguez MRN: [id number] DOB: 03-02-1951  Referral to Chronic Care Management (CCM) services was placed by Provider:  [name], NP on Date: 01/29/22.  Chronic Condition 1: HTN Provider Assessment and Plan  Essential hypertension - Well controlled.    Expected Outcome/Goals Addressed This Visit (Provider CCM goals/Provider Assessment and plan  Goal: CCM (Hypertension) Expected Outcome:  Monitor, Self-Manage And Reduce Symptoms of Hypertension  Symptom Management Condition 1: Take medications as prescribed   Attend all scheduled provider appointments Call pharmacy for medication refills 3-7 days in advance of running out of medications Check blood pressure and record readings Continue eating whole grains, fruits and vegetables, lean meats and healthy fats Limit sodium intake Engage in low impact activity as tolerated Call provider office for new concerns or questions     Chronic Condition 2: COPD Provider Assessment and Plan  COPD mixed type ([place]) - Per pulmonary    Expected Outcome/Goals Addressed This Visit (Provider CCM goals/Provider Assessment and plan  Goal: CCM (Chronic Obstructive Pulmonary Disease) Expected Outcome:  Monitor, Self-Manage and Reduce Symptoms of Chronic Obstructive Pulmonary Disease  Symptom Management Condition 2: Attend medical appointments as scheduled Take medications and use inhalers as prescribed Identify and remove indoor air pollutants Limit outdoor activity during cold weather Assess symptoms daily Follow rescue plan if symptoms flare-up Contact provider for new symptoms or concerns  Problem List Patient Active Problem List   Diagnosis Date Noted   Diabetes mellitus ([place]) 03/04/2022   IFG (impaired fasting glucose) 01/01/2022   Vitamin D deficiency 08/27/2021   Shortness of breath 01/28/2021   Pure hypercholesterolemia 01/28/2021   Tricompartment  osteoarthritis of right knee 09/20/2020   Acute pain of right knee 09/20/2020   Dependence on supplemental oxygen 09/20/2020   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 ([place]) 03/23/2017   Chronic respiratory failure with hypoxia ([place]) 02/04/2017   Psoriasis 09/26/2016   Obstructive sleep apnea 06/12/2016   COPD mixed type ([place]) 06/12/2016   Obesity hypoventilation syndrome ([place]) 04/24/2016   Hypoxia 04/05/2016   Acute respiratory failure with hypoxia ([place]) 04/05/2016   Impaired glucose tolerance 04/11/2014   Class 3 severe obesity with serious comorbidity and body mass index (BMI) of 45.0 to 49.9 in adult ([place]) 04/11/2014   Depression, recurrent ([place]) 12/21/2006   Essential hypertension 12/21/2006   GERD 12/21/2006   NEPHROLITHIASIS, HX OF 12/21/2006    Medication Management  Current Outpatient Medications:    albuterol (VENTOLIN HFA) 108 (90 Base) MCG/ACT inhaler, USE 2 INHALATIONS BY MOUTH  EVERY 6 HOURS AS NEEDED FOR WHEEZING OR SHORTNESS OF  BREATH, Disp: 34 g, Rfl: 3   amLODipine (NORVASC) 5 MG tablet, TAKE 1 TABLET BY MOUTH DAILY, Disp: 100 tablet, Rfl: 2   budesonide-formoterol (SYMBICORT) 160-4.5 MCG/ACT inhaler, Inhale 2 puffs into the lungs 2 (two) times daily. Need office visit for any additional refills, Disp: 30.6 each, Rfl: 0   losartan-hydrochlorothiazide (HYZAAR) 100-25 MG tablet, TAKE 1 TABLET BY MOUTH  DAILY, Disp: 90 tablet, Rfl: 3   omeprazole (PRILOSEC) 20 MG capsule, TAKE 1 CAPSULE BY MOUTH  DAILY, Disp: 90 capsule, Rfl: 3   PARoxetine (PAXIL) 30 MG tablet, TAKE 2 TABLETS BY MOUTH IN  THE MORNING (Patient taking differently: Take 30 mg by mouth daily.), Disp: 180 tablet, Rfl: 3   rosuvastatin (CRESTOR) 5 MG tablet, TAKE 1 TABLET BY MOUTH  EVERY OTHER DAY, Disp: 45 tablet, Rfl:  3   diazepam (VALIUM) 2 MG tablet, Take 1 tablet (2 mg total) by mouth at bedtime as needed for anxiety or muscle spasms., Disp: 30 tablet, Rfl: 0   furosemide (LASIX) 20 MG  tablet, TAKE 1 TABLET BY MOUTH DAILY (Patient not taking: Reported on 02/28/2022), Disp: 90 tablet, Rfl: 3   meclizine (ANTIVERT) 25 MG tablet, Take 1 tablet (25 mg total) by mouth daily as needed., Disp: 90 tablet, Rfl: 1   OZEMPIC, 0.25 OR 0.5 MG/DOSE, 2 MG/3ML SOPN, INJECT 0.25 MG INTO THE SKIN ONCE A WEEK, Disp: 3 mL, Rfl: 0   Semaglutide,0.25 or 0.'5MG'$ /DOS, 2 MG/3ML SOPN, Inject 0.5 mg into the skin once a week., Disp: 3 mL, Rfl: 2   traMADol (ULTRAM) 50 MG tablet, TAKE 1 TABLET BY MOUTH  EVERY 6 HOURS AS NEEDED FOR MODERATE PAIN OR SEVERE  PAIN, Disp: 30 tablet, Rfl: 2   triamcinolone (KENALOG) 0.025 % cream, Apply 1 application topically 2 (two) times daily., Disp: , Rfl:    triamcinolone (NASACORT) 55 MCG/ACT AERO nasal inhaler, Place 2 sprays into the nose daily. (Patient taking differently: Place 2 sprays into the nose as needed.), Disp: 32.4 mL, Rfl: 3   Vitamin D, Ergocalciferol, (DRISDOL) 1.25 MG (50000 UNIT) CAPS capsule, Take 1 capsule (50,000 Units total) by mouth every 7 (seven) days., Disp: 4 capsule, Rfl: 0  Cognitive Assessment Identity Confirmed: : Name; DOB Cognitive Status: Normal   Functional Assessment Hearing Difficulty or Deaf: no Wear Glasses or Blind: yes Vision Management: Wears glasses/Pending cataract extraction Concentrating, Remembering or Making Decisions Difficulty (CP): no Difficulty Communicating: no Difficulty Eating/Swallowing: no Walking or Climbing Stairs Difficulty: no Dressing/Bathing Difficulty: no Doing Errands Independently Difficulty (such as shopping) (CP): no Change in Functional Status Since Onset of Current Illness/Injury: no   Caregiver Assessment  Primary Source of Support/Comfort: spouse Name of Support/Comfort Primary Source: [place] in Home: spouse Name(s) of People in Home: [place] if Needed: none   Planned Interventions  HTN Reviewed provider's plan for hypertension management. Reviewed  medications and indications for use. Reports taking all medications as prescribed. Denies current concerns regarding medication management or prescription cost. Provided information regarding established blood pressure parameters along with indications for notifying a provider. Reports systolic readings have ranged in the 120s. Reports diastolic readings have ranged in the 50s and 60s. Advised to continue monitoring BP and record readings.  Discussed compliance with recommended cardiac prudent diet. Encouraged to read nutrition labels, monitor sodium intake, and avoid highly processed foods when possible. Discussed complications of uncontrolled blood pressure.  Reviewed s/sx of heart attack, stroke and worsening symptoms that require immediate medical attention.   COPD Reviewed provider's plan for management or COPD Reviewed medications. Reports taking medications and using inhalers as prescribed. Denies current concerns regarding medication management. Agreed to update the care management team with concerns related to prescription cost. Discussed known triggers. Reports symptoms have been well controlled. Experiences occasional sinus congestion in the morning. Denies episodes of shortness of breath. Currently using continuous O2 at 2  to 3L. Reports oxygen saturation of 95 to 98%. Reviewed current action plan and reinforced importance of daily self-assessment. Advised to make an appointment with provider if experiencing moderate symptoms for greater than 48 hours without improvement.  Provided information regarding infection prevention and increased risk r/t COPD. Advised to utilize prevention strategies to reduce risk of respiratory infection.  Reviewed worsening symptoms that require immediate medical attention.   Interaction and coordination with  outside resources, practitioners, and providers See CCM Referral  Care Plan: Available in MyChart

## 2022-03-06 NOTE — Telephone Encounter (Signed)
Prescription was sent to pharmacy and per patient she is aware it is at the pharmacy for her to pick up.

## 2022-03-10 DIAGNOSIS — L820 Inflamed seborrheic keratosis: Secondary | ICD-10-CM | POA: Diagnosis not present

## 2022-03-10 DIAGNOSIS — L570 Actinic keratosis: Secondary | ICD-10-CM | POA: Diagnosis not present

## 2022-03-10 DIAGNOSIS — L218 Other seborrheic dermatitis: Secondary | ICD-10-CM | POA: Diagnosis not present

## 2022-03-10 DIAGNOSIS — L814 Other melanin hyperpigmentation: Secondary | ICD-10-CM | POA: Diagnosis not present

## 2022-03-10 DIAGNOSIS — Z85828 Personal history of other malignant neoplasm of skin: Secondary | ICD-10-CM | POA: Diagnosis not present

## 2022-03-10 DIAGNOSIS — L720 Epidermal cyst: Secondary | ICD-10-CM | POA: Diagnosis not present

## 2022-03-11 NOTE — Progress Notes (Unsigned)
Chief Complaint:   OBESITY Laurie Dominguez is here to discuss her progress with her obesity treatment plan along with follow-up of her obesity related diagnoses. Laurie Dominguez is on the Category 3 Plan and states she is following her eating plan approximately 90% of the time. Laurie Dominguez states she is doing chair yoga for 15 minutes 2 times per week.  Today's visit was #: 9 Starting weight: 269 lbs Starting date: 08/21/2021 Today's weight: 246 lbs Today's date: 02/24/2022 Total lbs lost to date: 23 Total lbs lost since last in-office visit: 1  Interim History: Laurie Dominguez did well with avoiding weight gain over Thanksgiving. She tired to portion control the side dishes and increase lean protein.   Subjective:   1. Vitamin D deficiency Laurie Dominguez is on Vitamin D, and her recent level was at goal. She requests a refill.   2. Type 2 diabetes mellitus with other specified complication, with long-term current use of insulin (Laurie Dominguez) Laurie Dominguez is on Ozempic and she is doing well with decreasing simple carbohydrates in her diet.   Assessment/Plan:   1. Vitamin D deficiency We will refill prescription Vitamin D for 1 month. Laurie Dominguez will follow-up for routine testing of Vitamin D, at least 2-3 times per year to avoid over-replacement.  - Vitamin D, Ergocalciferol, (DRISDOL) 1.25 MG (50000 UNIT) CAPS capsule; Take 1 capsule (50,000 Units total) by mouth every 7 (seven) days.  Dispense: 4 capsule; Refill: 0  2. Type 2 diabetes mellitus with other specified complication, with long-term current use of insulin (Laurie Dominguez) Laurie Dominguez will continue with her diet, exercise, and Ozempic and will continue to monitor.  3. Obesity, Current BMI 45.1 Laurie Dominguez is currently in the action stage of change. As such, her goal is to continue with weight loss efforts. She has agreed to the Category 3 Plan.   Exercise goals: As is.   Behavioral modification strategies: increasing lean protein intake.  Laurie Dominguez has agreed  to follow-up with our clinic in 4 weeks. She was informed of the importance of frequent follow-up visits to maximize her success with intensive lifestyle modifications for her multiple health conditions.   Objective:   Blood pressure 113/65, pulse 96, temperature 98 F (36.7 C), height '5\' 2"'$  (1.575 m), weight 246 lb (111.6 kg). Body mass index is 44.99 kg/m.  General: Cooperative, alert, well developed, in no acute distress. HEENT: Conjunctivae and lids unremarkable. Cardiovascular: Regular rhythm.  Lungs: Normal work of breathing. Neurologic: No focal deficits.   Lab Results  Component Value Date   CREATININE 0.67 01/22/2022   BUN 13 01/22/2022   NA 145 (H) 01/22/2022   K 3.8 01/22/2022   CL 98 01/22/2022   CO2 33 (H) 01/22/2022   Lab Results  Component Value Date   ALT 3 01/22/2022   AST 9 01/22/2022   ALKPHOS 98 01/22/2022   BILITOT 0.2 01/22/2022   Lab Results  Component Value Date   HGBA1C 5.4 01/22/2022   HGBA1C 5.6 08/21/2021   HGBA1C 5.8 11/08/2020   HGBA1C 5.8 (H) 11/08/2019   HGBA1C 5.8 10/26/2018   Lab Results  Component Value Date   INSULIN 11.5 01/22/2022   INSULIN 12.0 08/21/2021   Lab Results  Component Value Date   TSH 4.230 08/21/2021   Lab Results  Component Value Date   CHOL 134 08/21/2021   HDL 41 08/21/2021   LDLCALC 76 08/21/2021   LDLDIRECT 150.5 06/20/2008   TRIG 88 08/21/2021   CHOLHDL 4 04/17/2021   Lab Results  Component Value Date  VD25OH 53.8 01/22/2022   VD25OH 10.5 (L) 08/21/2021   Lab Results  Component Value Date   WBC 6.3 11/08/2020   HGB 12.3 11/08/2020   HCT 38.1 11/08/2020   MCV 92.0 11/08/2020   PLT 264.0 11/08/2020   No results found for: "IRON", "TIBC", "FERRITIN"  Attestation Statements:   Reviewed by clinician on day of visit: allergies, medications, problem list, medical history, surgical history, family history, social history, and previous encounter notes.  I, Trixie Dredge, am acting as  transcriptionist for Dennard Nip, MD.  I have reviewed the above documentation for accuracy and completeness, and I agree with the above. -  Dennard Nip, MD

## 2022-03-23 DIAGNOSIS — I10 Essential (primary) hypertension: Secondary | ICD-10-CM | POA: Diagnosis not present

## 2022-03-23 DIAGNOSIS — J449 Chronic obstructive pulmonary disease, unspecified: Secondary | ICD-10-CM | POA: Diagnosis not present

## 2022-03-25 ENCOUNTER — Encounter (INDEPENDENT_AMBULATORY_CARE_PROVIDER_SITE_OTHER): Payer: Self-pay | Admitting: Physician Assistant

## 2022-03-25 ENCOUNTER — Ambulatory Visit (INDEPENDENT_AMBULATORY_CARE_PROVIDER_SITE_OTHER): Payer: Medicare Other | Admitting: Physician Assistant

## 2022-03-25 VITALS — BP 123/74 | HR 56 | Temp 98.4°F | Ht 62.0 in | Wt 241.0 lb

## 2022-03-25 DIAGNOSIS — E669 Obesity, unspecified: Secondary | ICD-10-CM | POA: Diagnosis not present

## 2022-03-25 DIAGNOSIS — Z6841 Body Mass Index (BMI) 40.0 and over, adult: Secondary | ICD-10-CM | POA: Diagnosis not present

## 2022-03-25 DIAGNOSIS — Z7985 Long-term (current) use of injectable non-insulin antidiabetic drugs: Secondary | ICD-10-CM | POA: Diagnosis not present

## 2022-03-25 DIAGNOSIS — E559 Vitamin D deficiency, unspecified: Secondary | ICD-10-CM

## 2022-03-25 DIAGNOSIS — Z794 Long term (current) use of insulin: Secondary | ICD-10-CM | POA: Diagnosis not present

## 2022-03-25 DIAGNOSIS — E1169 Type 2 diabetes mellitus with other specified complication: Secondary | ICD-10-CM | POA: Diagnosis not present

## 2022-03-25 DIAGNOSIS — E66813 Obesity, class 3: Secondary | ICD-10-CM

## 2022-03-25 MED ORDER — VITAMIN D (ERGOCALCIFEROL) 1.25 MG (50000 UNIT) PO CAPS: 50000.0000 [IU] | ORAL_CAPSULE | ORAL | 0 refills | Status: DC

## 2022-03-28 ENCOUNTER — Telehealth: Payer: Self-pay

## 2022-03-28 NOTE — Telephone Encounter (Signed)
Pt Ozempic came in today for pick up. Will place in the refrigerator in back POD. Pt husband notified of update and sttated he will try and pick it up [place].

## 2022-04-01 ENCOUNTER — Other Ambulatory Visit: Payer: Self-pay | Admitting: Acute Care

## 2022-04-05 DIAGNOSIS — J9611 Chronic respiratory failure with hypoxia: Secondary | ICD-10-CM | POA: Diagnosis not present

## 2022-04-06 NOTE — Progress Notes (Unsigned)
Chief Complaint:   OBESITY Laurie Dominguez is here to discuss her progress with her obesity treatment plan along with follow-up of her obesity related diagnoses. Charlottie is on the Category 3 Plan and states she is following her eating plan approximately 90% of the time. Zennie states she is exercising 0 minutes 0 times per week.  Today's visit was #: 10 Starting weight: 269 lbs Starting date: 08/21/2021 Today's weight: 241 lbs Today's date: 03/25/2022 Total lbs lost to date: 28 lbs Total lbs lost since last in-office visit: 5  Interim History: Laurie Dominguez has done very well with weight loss over the holidays. She reports her primary care provider [name], NP has been able to obtain semaglutide and she has now qualified for the patient assistance program for the next year for Ozempic.  She will follow-up with her primary care provider for the [place].  She reports she has been trying to focus on getting adequate protein and decreasing simple carbohydrates to promote her weight loss.  Subjective:   1. Type 2 diabetes mellitus with other specified complication, with long-term current use of insulin ([facility]CC) Laurie Dominguez's A1c was 5.4/insulin 11.5 on 01/22/22. On Ozempic 0.5 mg weekly--Denies any side effects.  PCP has given sample pack and she reports has been approved for Ozempic assistance program for next year (2024).  2. Vitamin D deficiency Laurie Dominguez is currently taking prescription ergocalciferol 50,000 IU once a week.  Last Vit D level of 53.8 on 01/22/22--at goal.  Assessment/Plan:   1. Type 2 diabetes mellitus with other specified complication, with long-term current use of insulin ([place]) Continue Ozempic. Continue Prescribed Nutrition Plan and exercise.  2. Vitamin D deficiency Continue/Refill ergocalciferol once weekly for 1 month with 0 refills.  Will check Vit D level in the next couple of months.  -Refill Vitamin D, Ergocalciferol, (DRISDOL) 1.25 MG (50000 UNIT) CAPS  capsule; Take 1 capsule (50,000 Units total) by mouth every 7 (seven) days.  Dispense: 4 capsule; Refill: 0  3. Obesity, Current BMI 44.1 Laurie Dominguez is currently in the action stage of change. As such, her goal is to continue with weight loss efforts. She has agreed to the Category 3 Plan.   Laurie Dominguez reports, trying to focus on adequate protein, and decreasing simple carbs.  Exercise goals: Older adults should follow the adult guidelines. When older adults cannot meet the adult guidelines, they should be as physically active as their abilities and conditions will allow.   Behavioral modification strategies: increasing lean protein intake and decreasing simple carbohydrates.  Laurie Dominguez has agreed to follow-up with our clinic in 4 weeks. She was informed of the importance of frequent follow-up visits to maximize her success with intensive lifestyle modifications for her multiple health conditions.   Objective:   Blood pressure 123/74, pulse (!) 56, temperature 98.4 F (36.9 C), height '5\' 2"'$  (1.575 m), weight 241 lb (109.3 kg), SpO2 90 %. Body mass index is 44.08 kg/m.  General: Cooperative, alert, well developed, in no acute distress. HEENT: Conjunctivae and lids unremarkable. Cardiovascular: Regular rhythm.  Lungs: Normal work of breathing. Neurologic: No focal deficits.   Lab Results  Component Value Date   CREATININE 0.67 01/22/2022   BUN 13 01/22/2022   NA 145 (H) 01/22/2022   K 3.8 01/22/2022   CL 98 01/22/2022   CO2 33 (H) 01/22/2022   Lab Results  Component Value Date   ALT 3 01/22/2022   AST 9 01/22/2022   ALKPHOS 98 01/22/2022   BILITOT 0.2 01/22/2022  Lab Results  Component Value Date   HGBA1C 5.4 01/22/2022   HGBA1C 5.6 08/21/2021   HGBA1C 5.8 11/08/2020   HGBA1C 5.8 (H) 11/08/2019   HGBA1C 5.8 10/26/2018   Lab Results  Component Value Date   INSULIN 11.5 01/22/2022   INSULIN 12.0 08/21/2021   Lab Results  Component Value Date   TSH 4.230 08/21/2021    Lab Results  Component Value Date   CHOL 134 08/21/2021   HDL 41 08/21/2021   LDLCALC 76 08/21/2021   LDLDIRECT 150.5 06/20/2008   TRIG 88 08/21/2021   CHOLHDL 4 04/17/2021   Lab Results  Component Value Date   VD25OH 53.8 01/22/2022   VD25OH 10.5 (L) 08/21/2021   Lab Results  Component Value Date   WBC 6.3 11/08/2020   HGB 12.3 11/08/2020   HCT 38.1 11/08/2020   MCV 92.0 11/08/2020   PLT 264.0 11/08/2020   No results found for: "IRON", "TIBC", "FERRITIN"  Attestation Statements:   Reviewed by clinician on day of visit: allergies, medications, problem list, medical history, surgical history, family history, social history, and previous encounter notes.  I, Brendell Tyus, am acting as transcriptionist for [employer], PA.  I have reviewed the above documentation for accuracy and completeness, and I agree with the above. -  Virgil Lightner,PA-C

## 2022-04-07 ENCOUNTER — Telehealth: Payer: Self-pay | Admitting: Acute Care

## 2022-04-07 NOTE — Telephone Encounter (Signed)
Left message for patient to call back. Patient will need an OV before any refills are ordered.

## 2022-04-16 MED ORDER — BUDESONIDE-FORMOTEROL FUMARATE 160-4.5 MCG/ACT IN AERO: 2.0000 | INHALATION_SPRAY | Freq: Two times a day (BID) | RESPIRATORY_TRACT | 0 refills | Status: DC

## 2022-04-16 NOTE — Telephone Encounter (Signed)
Called and spoke with patient. She verbalized understanding. Symbicort has been sent to her pharmacy.   Nothing further needed at time of call.

## 2022-04-16 NOTE — Telephone Encounter (Signed)
Laurie Dominguez since patients appointment is not until Feb 9th. Can we send her in just enough Symbicort until the appointment. Please advise

## 2022-04-16 NOTE — Telephone Encounter (Signed)
PT called and states she will make appt per Ms. Gladstone Pih notes to her.Can she get something to hold her over until the. lCosest appt was not until Feb.  Pharm: [facility]  Out of Symbecort . Maybe we have samples????  Please call again to update PT on what were doing.

## 2022-04-17 ENCOUNTER — Telehealth: Payer: Self-pay | Admitting: Adult Health

## 2022-04-17 ENCOUNTER — Ambulatory Visit (INDEPENDENT_AMBULATORY_CARE_PROVIDER_SITE_OTHER): Payer: Medicare Other

## 2022-04-17 DIAGNOSIS — I10 Essential (primary) hypertension: Secondary | ICD-10-CM

## 2022-04-17 DIAGNOSIS — J449 Chronic obstructive pulmonary disease, unspecified: Secondary | ICD-10-CM

## 2022-04-17 NOTE — Telephone Encounter (Signed)
Noted. FYI 

## 2022-04-17 NOTE — Telephone Encounter (Signed)
Patient dropped off document to be filled out by provider Handicap Placard. Patient requested to send it via Mail within 5-days. Document is located in providers tray at front office.   Please advise

## 2022-04-17 NOTE — Chronic Care Management (AMB) (Signed)
Chronic Care Management   CCM RN Visit Note  04/17/2022 Name: Laurie Dominguez MRN: [id number] DOB: Jan 28, 1951  Subjective: Laurie Dominguez is a 72 y.o. year old female who is a primary care patient of [name], NP. The patient was referred to the Chronic Care Management team for assistance with care management needs subsequent to provider initiation of CCM services and plan of care.    Today's Visit:  Engaged with patient by telephone for follow up visit.    Goals Addressed             This Visit's Progress    Goal: CCM (COPD) Expected Outcome: Monitor, Self-Manage and Reduce Symptoms of Chronic Obstructive Pulmonary Disease       Current Barriers:  Chronic Disease Management support and education needs related to COPD  Planned Interventions: Reviewed provider's plan for management or COPD. Remains compliant with plan. Report symptoms have been well controlled with prescribed inhalers.  Discussed plan for a portable oxygen concentrator. Currently requires O2 at 2  to 3L. Reports being unable to complete the 6-Minute walk test d/t mobility. Will follow up with the Pulmonology team/Dr. Elie Confer to discuss options. Will also follow up with local oxygen supplier. They offer options to borrow concentrators for two weeks at a time if patient will be out of the area and unable to transport oxygen tanks. Encouraged to assess symptoms daily. Advised to make an appointment with provider if experiencing moderate symptoms for greater than 48 hours without improvement.  Discussed need to avoid sick contacts and utilize prevention strategies to reduce risk of respiratory infection.  Reviewed worsening symptoms that require immediate medical attention.    Wt Readings from Last 3 Encounters:  04/21/22 239 lb (108.4 kg)  03/25/22 241 lb (109.3 kg)  02/24/22 246 lb (111.6 kg)     Symptom Management: Attend medical appointments as scheduled Discuss options for portable oxygen  concentrator with the Pulmonology team Take medications and use inhalers as prescribed Identify and remove indoor air pollutants Limit outdoor activity during cold weather Assess symptoms daily Follow rescue plan if symptoms flare-up Contact provider for new symptoms or concerns  Follow Up Plan:  Will follow up next month       Goal: CCM (Hypertension) Expected Outcome: Monitor, Self-Manage and Reduce Symptoms of Hypertension       Current Barriers:  Chronic Disease Management support and education needs related to Hypertension  Planned Interventions: Reviewed provider's plan for hypertension management. Reviewed medications. Reports taking as prescribed. Denies concerns regarding current regimen. Reviewed BP readings. Reports readings have been within range. Advised to continue monitoring and record readings. Reviewed compliance with recommended cardiac prudent diet. Reports doing very well with nutritional intake. Reports consuming mostly salads with oil and vinegar. Monitoring sodium intake and attempting to avoid highly processed foods. Advised to continue current plan and include lean proteins. Discussed activity plan. Ability to exercise remains limited d/t mobility. Reports requiring joint/knee injections in the past. Currently unable to ambulate for prolonged periods. Will consider water aerobics when weather is warmer. Reviewed s/sx of heart attack, stroke and worsening symptoms that require immediate medical attention.  BP Readings from Last 3 Encounters:  04/21/22 (!) 141/81  03/25/22 123/74  02/24/22 113/65      Symptom Management: Take medications as prescribed   Attend all scheduled provider appointments Call pharmacy for medication refills 3-7 days in advance of running out of medications Check blood pressure and record readings Continue eating whole grains, fruits and vegetables,  lean meats and healthy fats Limit sodium intake Engage in low impact activity as  tolerated Call provider office for new concerns or questions    Follow Up Plan:  Will follow up next month              PLAN: A member of the care management team will follow up next month.   Horris Latino RN Care Manager/Chronic Care Management [phone]

## 2022-04-18 NOTE — Telephone Encounter (Signed)
Copied placed in chart original mailed in. Pt notified of update

## 2022-04-21 ENCOUNTER — Encounter (INDEPENDENT_AMBULATORY_CARE_PROVIDER_SITE_OTHER): Payer: Self-pay | Admitting: Adult Health

## 2022-04-21 ENCOUNTER — Ambulatory Visit (INDEPENDENT_AMBULATORY_CARE_PROVIDER_SITE_OTHER): Payer: Medicare Other | Admitting: Adult Health

## 2022-04-21 VITALS — BP 141/81 | HR 76 | Temp 98.0°F | Ht 62.0 in | Wt 239.0 lb

## 2022-04-21 DIAGNOSIS — R7301 Impaired fasting glucose: Secondary | ICD-10-CM

## 2022-04-21 DIAGNOSIS — Z6841 Body Mass Index (BMI) 40.0 and over, adult: Secondary | ICD-10-CM | POA: Diagnosis not present

## 2022-04-21 DIAGNOSIS — E559 Vitamin D deficiency, unspecified: Secondary | ICD-10-CM

## 2022-04-21 DIAGNOSIS — E669 Obesity, unspecified: Secondary | ICD-10-CM | POA: Diagnosis not present

## 2022-04-21 MED ORDER — VITAMIN D (ERGOCALCIFEROL) 1.25 MG (50000 UNIT) PO CAPS: 50000.0000 [IU] | ORAL_CAPSULE | ORAL | 0 refills | Status: DC

## 2022-04-23 DIAGNOSIS — I10 Essential (primary) hypertension: Secondary | ICD-10-CM

## 2022-04-23 DIAGNOSIS — J449 Chronic obstructive pulmonary disease, unspecified: Secondary | ICD-10-CM | POA: Diagnosis not present

## 2022-05-01 ENCOUNTER — Telehealth: Payer: Self-pay

## 2022-05-01 NOTE — Progress Notes (Signed)
Care Management & Coordination Services Pharmacy Team  Reason for Encounter: Hypertension  Contacted patient to discuss hypertension disease state. Spoke with patient on 05/01/2022   Current antihypertensive regimen:  Amlodipine 5 mg daily Hyzaar 100/25 mg daily  Patient verbally confirms she is taking the above medications as directed. Yes  How often are you checking your Blood Pressure? Patient checks her blood pressures twice a week, she doesn't log her readings due to always being in the same range.  she checks her blood pressure at various times  Current home BP readings: Patient states her last reading was 130/64 on 04/25/2022.  Wrist or arm cuff:  Wrist cuff  OTC medications including pseudoephedrine or NSAIDs? Patient denies  Any readings above 180/100? Patient denies  What recent interventions/DTPs have been made by any provider to improve Blood Pressure control since last CPP Visit: No recent interventions  Any recent hospitalizations or ED visits since last visit with CPP? No recent hospital visits.   What diet changes have been made to improve Blood Pressure Control?  Diet changes - patient is [facility] Breakfast - patient will have cereal or fruit or eggs and bacon and a low calorie toast Lunch - patient will have left overs or something light  Dinner - patient will have a meat with vegetable Snack - cheese and cracker or vegetables Caffeine - patient denies any caffeine Salt intake - patient doesn't add  What exercise is being done to improve your Blood Pressure Control?  Patient is limited due to knees and ankles  Adherence Review: Is the patient currently on ACE/ARB medication? Yes Does the patient have >5 day gap between last estimated fill dates? No  Star Rating Drugs:  Losartan HCTZ 100/25 mg - last filled 01/26/2022 100 DS at [facility] Rosuvastatin 5 mg - last filled 12/21/2021 100 DS at [facility] Ozempic 0.55m - last filled  through pap   Chart Updates: Recent office visits:  None  Recent consult visits:  04/21/2022 KMina MarbleNP (weight/wellness) - Patient was seen for Vitamin D deficiency and additional concerns. No medication changes.   03/25/2022 Shawn Rayburn PA-C (weight/wellness) - Patient was seen for Type 2 diabetes mellitus with other specified complication, with long-term current use of insulin  and additional concerns. No medication changes.   02/24/2022 CDennard NipMD (weight/wellness) Patient was seen for Vitamin D deficiency and additional concerns. No medication changes.   01/22/2022 KMina MarbleNP (weight/wellness) - Patient was seen for IGF and additional concerns. No medication changes.   Hospital visits:  None  Medications: Outpatient Encounter Medications as of 05/01/2022  Medication Sig Note   albuterol (VENTOLIN HFA) 108 (90 Base) MCG/ACT inhaler USE 2 INHALATIONS BY MOUTH  EVERY 6 HOURS AS NEEDED FOR WHEEZING OR SHORTNESS OF  BREATH    amLODipine (NORVASC) 5 MG tablet TAKE 1 TABLET BY MOUTH DAILY    budesonide-formoterol (SYMBICORT) 160-4.5 MCG/ACT inhaler Inhale 2 puffs into the lungs 2 (two) times daily. Need office visit for any additional refills    diazepam (VALIUM) 2 MG tablet Take 1 tablet (2 mg total) by mouth at bedtime as needed for anxiety or muscle spasms.    furosemide (LASIX) 20 MG tablet TAKE 1 TABLET BY MOUTH DAILY 02/28/2022: Patient reports not taking   losartan-hydrochlorothiazide (HYZAAR) 100-25 MG tablet TAKE 1 TABLET BY MOUTH  DAILY    meclizine (ANTIVERT) 25 MG tablet Take 1 tablet (25 mg total) by mouth daily as needed.  omeprazole (PRILOSEC) 20 MG capsule TAKE 1 CAPSULE BY MOUTH  DAILY    OZEMPIC, 0.25 OR 0.5 MG/DOSE, 2 MG/3ML SOPN INJECT 0.25 MG INTO THE SKIN ONCE A WEEK    PARoxetine (PAXIL) 30 MG tablet TAKE 2 TABLETS BY MOUTH IN  THE MORNING (Patient taking differently: Take 30 mg by mouth daily.) 02/28/2022: Reports taking 8m daily   rosuvastatin  (CRESTOR) 5 MG tablet TAKE 1 TABLET BY MOUTH  EVERY OTHER DAY    Semaglutide,0.25 or 0.5MG/DOS, 2 MG/3ML SOPN Inject 0.5 mg into the skin once a week.    traMADol (ULTRAM) 50 MG tablet TAKE 1 TABLET BY MOUTH  EVERY 6 HOURS AS NEEDED FOR MODERATE PAIN OR SEVERE  PAIN    triamcinolone (KENALOG) 0.025 % cream Apply 1 application topically 2 (two) times daily.    triamcinolone (NASACORT) 55 MCG/ACT AERO nasal inhaler Place 2 sprays into the nose daily. (Patient taking differently: Place 2 sprays into the nose as needed.)    Vitamin D, Ergocalciferol, (DRISDOL) 1.25 MG (50000 UNIT) CAPS capsule Take 1 capsule (50,000 Units total) by mouth every 7 (seven) days.    No facility-administered encounter medications on file as of 05/01/2022.  Fill History:   Dispensed Days Supply Quantity Provider Pharmacy  ALBUTEROL SULFATE HFA  108 (90 Base) MCG/ACT AERS 09/06/2021 100 34 g      Dispensed Days Supply Quantity Provider Pharmacy  SYMBICORT  160-4.5 MCG/ACT AERO 04/18/2022 90 30.6 g      Dispensed Days Supply Quantity Provider Pharmacy  FUROSEMIDE  20 MG TABS 09/17/2021 90 90 tablet      Dispensed Days Supply Quantity Provider Pharmacy  LOSARTAN POTASSIUM/HYDROCHLOROTHIAZ IDE 100-25 MG TABS 01/26/2022 100 100 tablet      Dispensed Days Supply Quantity Provider Pharmacy  OMEPRAZOLE  20 MG CPDR 01/26/2022 100 100      Dispensed Days Supply Quantity Provider Pharmacy  PAROXETINE HCL  30 MG TABS 09/06/2021 90 180 tablet      Dispensed Days Supply Quantity Provider Pharmacy  ROSUVASTATIN CALCIUM  5 MG TABS 12/21/2021 100 50 tablet      Dispensed Days Supply Quantity Provider Pharmacy  OZEMPIC (0.25&0.5) 2MG/3ML PEN 01/27/2022 28 3 mL      Dispensed Days Supply Quantity Provider Pharmacy  TRIAMCINOLON 0.1%   CRE 12/10/2021 30 454 g     Recent Office Vitals: BP Readings from Last 3 Encounters:  04/21/22 (!) 141/81  03/25/22 123/74  02/24/22 113/65   Pulse Readings from Last 3 Encounters:   04/21/22 76  03/25/22 (!) 56  02/24/22 96    Wt Readings from Last 3 Encounters:  04/21/22 239 lb (108.4 kg)  03/25/22 241 lb (109.3 kg)  02/24/22 246 lb (111.6 kg)     Kidney Function Lab Results  Component Value Date/Time   CREATININE 0.67 01/22/2022 01:04 PM   CREATININE 0.80 08/21/2021 12:47 PM   CREATININE 0.74 11/08/2019 10:09 AM   GFR 87.00 11/08/2020 09:38 AM   GFRNONAA 82 11/08/2019 10:09 AM   GFRAA 95 11/08/2019 10:09 AM       Latest Ref Rng & Units 01/22/2022    1:04 PM 08/21/2021   12:47 PM 11/08/2020    9:38 AM  BMP  Glucose 70 - 99 mg/dL 97  102  128   BUN 8 - 27 mg/dL 13  17  17   $ Creatinine 0.57 - 1.00 mg/dL 0.67  0.80  0.70   BUN/Creat Ratio 12 - 28 19  21  Sodium 134 - 144 mmol/L 145  142  145   Potassium 3.5 - 5.2 mmol/L 3.8  3.9  3.7   Chloride 96 - 106 mmol/L 98  96  98   CO2 20 - 29 mmol/L 33  30  40   Calcium 8.7 - 10.3 mg/dL 9.5  9.3  9.2   [place] Pharmacist Assistant [phone]

## 2022-05-02 ENCOUNTER — Ambulatory Visit: Payer: Medicare Other | Admitting: Acute Care

## 2022-05-02 ENCOUNTER — Encounter: Payer: Self-pay | Admitting: Acute Care

## 2022-05-02 VITALS — BP 142/84 | HR 69 | Temp 98.4°F | Ht 63.0 in | Wt 246.4 lb

## 2022-05-02 DIAGNOSIS — I10 Essential (primary) hypertension: Secondary | ICD-10-CM

## 2022-05-02 DIAGNOSIS — J9611 Chronic respiratory failure with hypoxia: Secondary | ICD-10-CM | POA: Diagnosis not present

## 2022-05-02 DIAGNOSIS — J449 Chronic obstructive pulmonary disease, unspecified: Secondary | ICD-10-CM | POA: Diagnosis not present

## 2022-05-02 DIAGNOSIS — G4734 Idiopathic sleep related nonobstructive alveolar hypoventilation: Secondary | ICD-10-CM | POA: Diagnosis not present

## 2022-05-02 MED ORDER — BREZTRI AEROSPHERE 160-9-4.8 MCG/ACT IN AERO: 2.0000 | INHALATION_SPRAY | Freq: Two times a day (BID) | RESPIRATORY_TRACT | 0 refills | Status: DC

## 2022-05-02 MED ORDER — BUDESONIDE-FORMOTEROL FUMARATE 160-4.5 MCG/ACT IN AERO: 2.0000 | INHALATION_SPRAY | Freq: Two times a day (BID) | RESPIRATORY_TRACT | 5 refills | Status: DC

## 2022-05-02 NOTE — Progress Notes (Signed)
History of Present Illness Laurie Dominguez is a 72 y.o. female  former smoker ( Quit 2008 with an 80 pack year smoking history) with  COPD, OSA, not wearing CPAP, chronic hypoxic respiratory failure  on home oxygen. She is followed by Dr. Annamaria Dominguez. She is also followed through the lung   05/02/2022 Pt. Presents for follow up. She was last seen in the office 01/2021 when she was evaluated for OSA. Sleep study showed she did not have OSA, but she did have nocturnal desaturations. She currently wears oxygen at 3 L Laurie Dominguez at bedtime.  She states she has been doing well. She is here in a motorized wheel chair. She is wearing her oxygen at 2.5 to 3 L Laurie Dominguez. Her maintenance is Symbicort. She also has a rescue inhaler that she uses very rarely. She states she needs her rescue more in the summer than in the winter. She does have issues with her sinuses with weather changes. She does use Flonase when her sinuses are acting up. She also takes Mucinex at bedtime. She feels her COPD has been stable. No significant flare.  She denies any wheezing She no longer needs her lasix since she has had weight loss.    She has lost 60 pounds through the [facility]. She feels much better after losing the weight.   Test Results: 01/29/2021 Home Sleep Study Results IMPRESSIONS - While she had several respiratory events, many of these were not associated with significant oxygen desaturation.  As such she did not meet criteria during this study for diagnosis of obstructive sleep apnea.  Her overall AHI was 0.6 with an SpO2 low of 89%.  Her overall RDI was 12.3. - She used 3 liters supplemental oxygen during this study. - The patient snored with moderate snoring volume. - EKG findings include PVCs. - Clinically significant periodic limb movements did not occur during sleep. Associated arousals were significant.   DIAGNOSIS - Nocturnal Hypoxemia (G47.36)   RECOMMENDATIONS - Continue 3 liters  supplemental oxygen at night. - Avoid alcohol, sedatives and other CNS depressants that may worsen sleep apnea and disrupt normal sleep architecture. - Sleep hygiene should be reviewed to assess factors that may improve sleep quality. - Weight management and regular exercise should be initiated or continued if appropriate.       Latest Ref Rng & Units 11/08/2020    9:38 AM 11/08/2019   10:09 AM 10/26/2018   10:42 AM  CBC  WBC 4.0 - 10.5 K/uL 6.3  7.2  7.7   Hemoglobin 12.0 - 15.0 g/dL 12.3  13.1  13.4   Hematocrit 36.0 - 46.0 % 38.1  41.2  40.7   Platelets 150.0 - 400.0 K/uL 264.0  314  333.0        Latest Ref Rng & Units 01/22/2022    1:04 PM 08/21/2021   12:47 PM 11/08/2020    9:38 AM  BMP  Glucose 70 - 99 mg/dL 97  102  128   BUN 8 - 27 mg/dL 13  17  17   $ Creatinine 0.57 - 1.00 mg/dL 0.67  0.80  0.70   BUN/Creat Ratio 12 - 28 19  21    $ Sodium 134 - 144 mmol/L 145  142  145   Potassium 3.5 - 5.2 mmol/L 3.8  3.9  3.7   Chloride 96 - 106 mmol/L 98  96  98   CO2 20 - 29 mmol/L 33  30  40   Calcium  8.7 - 10.3 mg/dL 9.5  9.3  9.2     BNP    Component Value Date/Time   BNP 20.4 04/04/2016 1805    ProBNP No results found for: "PROBNP"  PFT No results found for: "FEV1PRE", "FEV1POST", "FVCPRE", "FVCPOST", "TLC", "DLCOUNC", "PREFEV1FVCRT", "PSTFEV1FVCRT"  No results found.   Past medical hx Past Medical History:  Diagnosis Date   Colon polyp 08/16/2004   Hyperplastic   COPD (chronic obstructive pulmonary disease) (HCC)    Depression    GERD (gastroesophageal reflux disease)    High cholesterol    Hypertension    Joint pain    Nephrolithiasis    Obesity    SOB (shortness of breath)    Swelling of lower extremity      Social History   Tobacco Use   Smoking status: Former    Packs/day: 2.00    Years: 40.00    Total pack years: 80.00    Types: Cigarettes    Quit date: 03/24/2006    Years since quitting: 16.1   Smokeless tobacco: Never  Vaping Use   Vaping  Use: Never used  Substance Use Topics   Alcohol use: No    Alcohol/week: 0.0 standard drinks of alcohol   Drug use: No    Laurie Dominguez reports that she quit smoking about 16 years ago. Her smoking use included cigarettes. She has a 80.00 pack-year smoking history. She has never used smokeless tobacco. She reports that she does not drink alcohol and does not use drugs.  Tobacco Cessation: Former smoker , quit 2008 with a 40 pack year smoking history   Past surgical hx, Family hx, Social hx all reviewed.  Current Outpatient Medications on File Prior to Visit  Medication Sig   albuterol (VENTOLIN HFA) 108 (90 Base) MCG/ACT inhaler USE 2 INHALATIONS BY MOUTH  EVERY 6 HOURS AS NEEDED FOR WHEEZING OR SHORTNESS OF  BREATH   amLODipine (NORVASC) 5 MG tablet TAKE 1 TABLET BY MOUTH DAILY   budesonide-formoterol (SYMBICORT) 160-4.5 MCG/ACT inhaler Inhale 2 puffs into the lungs 2 (two) times daily. Need office visit for any additional refills   losartan-hydrochlorothiazide (HYZAAR) 100-25 MG tablet TAKE 1 TABLET BY MOUTH  DAILY   meclizine (ANTIVERT) 25 MG tablet Take 1 tablet (25 mg total) by mouth daily as needed.   omeprazole (PRILOSEC) 20 MG capsule TAKE 1 CAPSULE BY MOUTH  DAILY   OZEMPIC, 0.25 OR 0.5 MG/DOSE, 2 MG/3ML SOPN INJECT 0.25 MG INTO THE SKIN ONCE A WEEK   PARoxetine (PAXIL) 30 MG tablet TAKE 2 TABLETS BY MOUTH IN  THE MORNING (Patient taking differently: Take 30 mg by mouth daily.)   rosuvastatin (CRESTOR) 5 MG tablet TAKE 1 TABLET BY MOUTH  EVERY OTHER DAY   Semaglutide,0.25 or 0.5MG/DOS, 2 MG/3ML SOPN Inject 0.5 mg into the skin once a week.   triamcinolone (KENALOG) 0.025 % cream Apply 1 application topically 2 (two) times daily.   triamcinolone (NASACORT) 55 MCG/ACT AERO nasal inhaler Place 2 sprays into the nose daily. (Patient taking differently: Place 2 sprays into the nose as needed.)   Vitamin D, Ergocalciferol, (DRISDOL) 1.25 MG (50000 UNIT) CAPS capsule Take 1 capsule (50,000  Units total) by mouth every 7 (seven) days.   diazepam (VALIUM) 2 MG tablet Take 1 tablet (2 mg total) by mouth at bedtime as needed for anxiety or muscle spasms. (Patient not taking: Reported on 05/02/2022)   furosemide (LASIX) 20 MG tablet TAKE 1 TABLET BY MOUTH DAILY (Patient not taking: Reported  on 05/02/2022)   traMADol (ULTRAM) 50 MG tablet TAKE 1 TABLET BY MOUTH  EVERY 6 HOURS AS NEEDED FOR MODERATE PAIN OR SEVERE  PAIN (Patient not taking: Reported on 05/02/2022)   No current facility-administered medications on file prior to visit.     Allergies  Allergen Reactions   Codeine Phosphate    Simvastatin     Myalgia     Review Of Systems:  Constitutional:   + intentional   weight loss,No  night sweats,  Fevers, chills, fatigue, or  lassitude.  HEENT:   No headaches,  Difficulty swallowing,  Tooth/dental problems, or  Sore throat,                No sneezing, itching, ear ache, nasal congestion, post nasal drip,   CV:  No chest pain,  Orthopnea, PND, swelling in lower extremities, anasarca, dizziness, palpitations, syncope.   GI  No heartburn, indigestion, abdominal pain, nausea, vomiting, diarrhea, change in bowel habits, loss of appetite, bloody stools.   Resp: + shortness of breath with exertion less at rest.  + baseline  excess mucus, + baseline  productive cough,  No non-productive cough,  No coughing up of blood.  No change in color of mucus.  + rare  wheezing.  No chest wall deformity  Skin: no rash or lesions.  GU: no dysuria, change in color of urine, no urgency or frequency.  No flank pain, no hematuria   MS:  No joint pain or swelling.  No decreased range of motion.  No back pain.  Psych:  No change in mood or affect. No depression or anxiety.  No memory loss.   Vital Signs BP (!) 142/84   Pulse 69   Temp 98.4 F (36.9 C) (Oral)   Ht 5' 3"$  (1.6 m)   Wt 246 lb 6.4 oz (111.8 kg)   SpO2 97%   BMI 43.65 kg/m    Physical Exam:  General- No distress,  A&Ox74m pleasant , in wheelchair ENT: No sinus tenderness, TM clear, pale nasal mucosa, no oral exudate,no post nasal drip, no LAN Cardiac: S1, S2, regular rate and rhythm, no murmur Chest: No wheeze/ rales/ dullness; no accessory muscle use, no nasal flaring, no sternal retractions, distant and diminished per bases Abd.: Soft Non-tender ND, BS +, Body mass index is 43.65 kg/m.  Ext: No clubbing cyanosis, trace BLE edema Neuro:  physical deconditioning, MAE x 3, A&O x 3 Skin: No rashes, warm and dry, no lesions  Psych: normal mood and behavior   Assessment/Plan  nocturnal hypoxemia Plan - Wear oxygen at 3 L [place] at bedtime - Avoid alcohol, sedatives and other CNS depressants that may worsen sleep apnea and disrupt    normal sleep architecture. - Sleep hygiene should be optimized - Work on weight loss, and increasing exercise as able. - Follow up with Dr. YAnnamaria Bootsin 6 months or sooner as needed    Chronic Hypoxic Respiratory Failure Does not Qualify for Inogen as the pulsed oxygen max does not meet her ambulatory needs.  Plan Remember oxygen saturations must always be > 88%. It is important to wear your oxygen at all times.  Remember to monitor your oxygen levels with an oxygen saturation monitor.  Hypertension  Currently taking Lasix 20 mg daily Plan Follow up wioth cardiology  Body mass index is 43.65 kg/m.  60 pound weight loss with [facility].  Plan Continue working on weight loss Congratulations on your weight loss.   I  spent 35 minutes dedicated to the care of this patient on the date of this encounter to include pre-visit review of records, face-to-face time with the patient discussing conditions above, post visit ordering of testing, clinical documentation with the electronic health record, making appropriate referrals as documented, and communicating necessary information to the patient's healthcare team.   Magdalen Spatz, NP 05/02/2022  10:03  AM

## 2022-05-02 NOTE — Patient Instructions (Addendum)
It is good to see you today We will renew your Symbicort. Continue 2 puffs twice daily  Rinse mouth after use.  We will do a therapeutic Trial of Breztri. Take 2 puffs in the morning and 2 puffs at night. Rinse mouth after use,  Do not use Symbicort while using Breztri. Call us and let us know if you like this more, and we can send in a prescription.  Continue wearing oxygen oxygen at 2.5-3 L as you have been doing. Maintain sats > 88 % at all times.  Continue wearing oxygen at bedtime  Continue working on weight loss.  Congratulations on your 60 pound weight loss.  Call if you would like to do a walk test on the smaller portable oxygen tank.  Follow up with Dr. Annamaria Boots or Judson Roch NP in 6 months  or sooner if you need [place].  Please contact office for sooner follow up if symptoms do not improve or worsen or seek emergency care

## 2022-05-06 DIAGNOSIS — J9611 Chronic respiratory failure with hypoxia: Secondary | ICD-10-CM | POA: Diagnosis not present

## 2022-05-06 NOTE — Progress Notes (Signed)
Chief Complaint:   OBESITY Laurie Dominguez is here to discuss her progress with her obesity treatment plan along with follow-up of her obesity related diagnoses. Laurie Dominguez is on the Category 3 Plan and states she is following her eating plan approximately 85% of the time. Laurie Dominguez states she is exercising.  Today's visit was #: 60 Starting weight: 25 LBS Starting date: 08/21/2021 Today's weight: 239 LBS Today's date: 04/21/2022 Total lbs lost to date: 30 LBS Total lbs lost since last in-office visit: 2 LBS  Interim History:  PCP/Cory Nafziger, NP-C manages weekly Ozmepic 0.2m.  She will ambulate with scooter when running errands/out at appointments and walker when back at home.  Due to chronic knee pain, Ms. HMaestreneeds bilateral knee replacement.  Right knee is more painful/edematous than L knee.  Subjective:   1. Vitamin D deficiency 01/22/2022, vitamin D level was 53.8 stable.   Patient endorses increased energy level.  2. IFG (impaired fasting glucose) Lab Results  Component Value Date   HGBA1C 5.4 01/22/2022   HGBA1C 5.6 08/21/2021   HGBA1C 5.8 11/08/2020  11/08/20 CMP- BG- 128 08/21/21 CMP -BG- 102  Assessment/Plan:   1. Vitamin D deficiency Refill- Vitamin D, Ergocalciferol, (DRISDOL) 1.25 MG (50000 UNIT) CAPS capsule; Take 1 capsule (50,000 Units total) by mouth every 7 (seven) days.  Dispense: 4 capsule; Refill: 0  2. IFG (impaired fasting glucose) Continue GLP-1 therapy per PCP.   Decrease sugar and carb intake and increase daily activity.  3. Obesity, Current BMI 43.9 EAuriais currently in the action stage of change. As such, her goal is to continue with weight loss efforts. She has agreed to the Category 3 Plan and keeping a food journal and adhering to recommended goals of 450-600 calories and 40 protein at supper..   Exercise goals:  Increase activity as tolerated.  Behavioral modification strategies: increasing lean protein intake, decreasing simple  carbohydrates, meal planning and cooking strategies, keeping healthy foods in the home, better snacking choices, and planning for success.  ECorihas agreed to follow-up with our clinic in 4 weeks. She was informed of the importance of frequent follow-up visits to maximize her success with intensive lifestyle modifications for her multiple health conditions.   Objective:   Blood pressure (!) 141/81, pulse 76, temperature 98 F (36.7 C), height 5' 2"$  (1.575 m), weight 239 lb (108.4 kg), SpO2 91 %. Body mass index is 43.71 kg/m.  General: Cooperative, alert, well developed, in no acute distress. HEENT: Conjunctivae and lids unremarkable. Cardiovascular: Regular rhythm.  Lungs: Normal work of breathing. Neurologic: No focal deficits.   Lab Results  Component Value Date   CREATININE 0.67 01/22/2022   BUN 13 01/22/2022   NA 145 (H) 01/22/2022   K 3.8 01/22/2022   CL 98 01/22/2022   CO2 33 (H) 01/22/2022   Lab Results  Component Value Date   ALT 3 01/22/2022   AST 9 01/22/2022   ALKPHOS 98 01/22/2022   BILITOT 0.2 01/22/2022   Lab Results  Component Value Date   HGBA1C 5.4 01/22/2022   HGBA1C 5.6 08/21/2021   HGBA1C 5.8 11/08/2020   HGBA1C 5.8 (H) 11/08/2019   HGBA1C 5.8 10/26/2018   Lab Results  Component Value Date   INSULIN 11.5 01/22/2022   INSULIN 12.0 08/21/2021   Lab Results  Component Value Date   TSH 4.230 08/21/2021   Lab Results  Component Value Date   CHOL 134 08/21/2021   HDL 41 08/21/2021   LDLCALC 76 08/21/2021  LDLDIRECT 150.5 06/20/2008   TRIG 88 08/21/2021   CHOLHDL 4 04/17/2021   Lab Results  Component Value Date   VD25OH 53.8 01/22/2022   VD25OH 10.5 (L) 08/21/2021   Lab Results  Component Value Date   WBC 6.3 11/08/2020   HGB 12.3 11/08/2020   HCT 38.1 11/08/2020   MCV 92.0 11/08/2020   PLT 264.0 11/08/2020   No results found for: "IRON", "TIBC", "FERRITIN"  Attestation Statements:   Reviewed by clinician on day of visit:  allergies, medications, problem list, medical history, surgical history, family history, social history, and previous encounter notes.  I, Davy Pique, RMA, am acting as Location manager for Mina Marble, NP.  I have reviewed the above documentation for accuracy and completeness, and I agree with the above. -  Silveria Botz d. Shaine Newmark, NP-C

## 2022-05-09 ENCOUNTER — Ambulatory Visit (INDEPENDENT_AMBULATORY_CARE_PROVIDER_SITE_OTHER): Payer: Medicare Other

## 2022-05-09 DIAGNOSIS — J449 Chronic obstructive pulmonary disease, unspecified: Secondary | ICD-10-CM

## 2022-05-09 DIAGNOSIS — I10 Essential (primary) hypertension: Secondary | ICD-10-CM

## 2022-05-09 NOTE — Chronic Care Management (AMB) (Signed)
  Chronic Care Management   CCM RN Visit Note  05/09/2022 Name: Laurie Dominguez MRN: [id number] DOB: [birth year]  Subjective: Laurie Dominguez is a 72 y.o. year old female who is a primary care patient of [name], NP. The patient was referred to the Chronic Care Management team for assistance with care management needs subsequent to provider initiation of CCM services and plan of care.    Today's Visit:  Engaged with patient by telephone for follow up visit.      Goals Addressed             This Visit's Progress    Goal: CCM (COPD) Expected Outcome: Monitor, Self-Manage and Reduce Symptoms of Chronic Obstructive Pulmonary Disease       Current Barriers:  Chronic Disease Management support and education needs related to COPD  Planned Interventions: Reviewed provider's plan for management or COPD. Reviewed medications. Reports starting Breztri two days ago. Reports using as prescribed. Notes symptoms have been very well controlled. Reviewed plan for a portable oxygen concentrator. Reports still being unable to complete the required walk test d/t mobility. Reports using continuous oxygen as recommended.  Reviewed symptoms. Advised to continue assessing daily. Reminded of need to notify a provider if experiencing moderate symptoms for greater than 48 hours without improvement.  Reviewed strategies to reduce risk of respiratory infection.  Reviewed worsening symptoms that require immediate medical attention.    Wt Readings from Last 3 Encounters:  05/02/22 246 lb 6.4 oz (111.8 kg)  04/21/22 239 lb (108.4 kg)  03/25/22 241 lb (109.3 kg)      Symptom Management: Attend medical appointments as scheduled Take medications and use inhalers as prescribed Identify and remove indoor air pollutants Limit outdoor activity during cold weather Assess symptoms daily Follow rescue plan if symptoms flare-up Contact provider for new symptoms or concerns  Follow Up Plan:   Will  follow up next month     Goal: CCM (Hypertension) Expected Outcome: Monitor, Self-Manage and Reduce Symptoms of Hypertension       Current Barriers:  Chronic Disease Management support and education needs related to Hypertension  Planned Interventions: Reviewed provider's plan for hypertension management. Reports compliance with plan. Taking medications and monitoring BP as advised. Reports readings have been within range. Advised to continue monitoring and record readings. Reviewed symptoms. Denies episodes of chest pain, palpitations, headaches or dizziness.  Reviewed plan for nutrition and exercise. Remains compliant with heart heathy diet. Ability to exercise remains limited but reports she will follow up with the weight management clinic as scheduled on 05/19/22. Reviewed s/sx of heart attack, stroke and worsening symptoms that require immediate medical attention.   BP Readings from Last 3 Encounters:  04/21/22 (!) 141/81  03/25/22 123/74  02/24/22 113/65      Symptom Management: Take medications as prescribed   Attend all scheduled provider appointments Call pharmacy for medication refills 3-7 days in advance of running out of medications Check blood pressure and record readings Continue eating whole grains, fruits and vegetables, lean meats and healthy fats Limit sodium intake Engage in low impact activity as tolerated Call provider office for new concerns or questions    Follow Up Plan:  Will follow up next month           PLAN A member of the care management team will follow up next month.   Horris Latino RN Care Manager/Chronic Care Management [phone]

## 2022-05-19 ENCOUNTER — Ambulatory Visit (INDEPENDENT_AMBULATORY_CARE_PROVIDER_SITE_OTHER): Payer: Medicare Other | Admitting: Adult Health

## 2022-05-19 ENCOUNTER — Encounter (INDEPENDENT_AMBULATORY_CARE_PROVIDER_SITE_OTHER): Payer: Self-pay | Admitting: Adult Health

## 2022-05-19 VITALS — BP 137/64 | HR 68 | Temp 98.1°F | Ht 63.0 in | Wt 237.0 lb

## 2022-05-19 DIAGNOSIS — E669 Obesity, unspecified: Secondary | ICD-10-CM

## 2022-05-19 DIAGNOSIS — R7301 Impaired fasting glucose: Secondary | ICD-10-CM

## 2022-05-19 DIAGNOSIS — Z6841 Body Mass Index (BMI) 40.0 and over, adult: Secondary | ICD-10-CM

## 2022-05-19 DIAGNOSIS — E559 Vitamin D deficiency, unspecified: Secondary | ICD-10-CM | POA: Diagnosis not present

## 2022-05-19 MED ORDER — VITAMIN D (ERGOCALCIFEROL) 1.25 MG (50000 UNIT) PO CAPS: 50000.0000 [IU] | ORAL_CAPSULE | ORAL | 0 refills | Status: DC

## 2022-05-19 NOTE — Progress Notes (Signed)
Chief Complaint:   OBESITY Laurie Dominguez is here to discuss her progress with her obesity treatment plan along with follow-up of her obesity related diagnoses. [name] is on the Category 3 Plan and states she is following her eating plan approximately 80% of the time.  [name] states she is not currently exercising.  Today's visit was #: 12 Starting weight: 269 lbs Starting date: 08/21/2021 Today's weight: 237 lbs Today's date: 05/19/2022 Total lbs lost to date: 32 lbs Total lbs lost since last in-office visit: - 2 lbs  Interim History:  Reviewed MAR with pt- she reports stopping Furosemide '20mg'$  in late 2023. Since off loop diuretic- she denies dyspnea or significant increase in lower extremity edema.  She has remained on Losartan/HCTS 100/'25mg'$ - she takes each morning.  She estimates to drink 3-4 16 oz containers of water/day.  Discussed starting exercise, she states "I feel like I am ready to start something". Of note- her lovely husband is at [facility] during [place].  Subjective:   1. IFG (impaired fasting glucose) Lab Results  Component Value Date   HGBA1C 5.4 01/22/2022   HGBA1C 5.6 08/21/2021   HGBA1C 5.8 11/08/2020  Discussed at length with pt and pt's husband. Pt has never been dx'd with T2D, EPIC review does not reveal am A1c > 6.5 PCP started her on Ozempic for IFG and weight loss. She is currently on Ozempic 0.'5mg'$  once week- she denies mass in neck, dyspahgia, dyspepsia, persistent hoarseness, or N/V/C  2. Vitamin D deficiency  Latest Reference Range & Units 01/22/22 13:04  Vitamin D, 25-Hydroxy 30.0 - 100.0 ng/mL 53.8  She is on weekly Ergocalciferol- denies N/V/Muscle Weakness She endorses slight increase in fatigue levels, however she reports recent disruption in sleep. She reports that this is cyclical and will "smooth itself out". She reports nocturia- 1-2 times a night.   Assessment/Plan:   1. IFG (impaired fasting glucose) Continue GLP-1 therapy per  PCP  2. Vitamin D deficiency Refill - Vitamin D, Ergocalciferol, (DRISDOL) 1.25 MG (50000 UNIT) CAPS capsule; Take 1 capsule (50,000 Units total) by mouth every 7 (seven) days.  Dispense: 4 capsule; Refill: 0  3. Obesity, Current BMI 43.5  Laurie Dominguez is currently in the action stage of change. As such, her goal is to continue with weight loss efforts. She has agreed to the Category 3 Plan.   Handouts:  Banded Resistance Training.  Exercise goals:  Perform morning exercise routine with her husband at least 1 x week.  This can include use of light hand held weights and/or resistance band.  Behavioral modification strategies: increasing lean protein intake, decreasing simple carbohydrates, increasing vegetables, no skipping meals, and planning for success.  [name] has agreed to follow-up with our clinic in 4 weeks. She was informed of the importance of frequent follow-up visits to maximize her success with intensive lifestyle modifications for her multiple health conditions.    Objective:   Blood pressure 137/64, pulse 68, temperature 98.1 F (36.7 C), height '5\' 3"'$  (1.6 m), weight 237 lb (107.5 kg), SpO2 93 %. Body mass index is 41.98 kg/m.  General: Cooperative, alert, well developed, in no acute distress. HEENT: Conjunctivae and lids unremarkable. Cardiovascular: Regular rhythm.  Lungs: Normal work of breathing. Neurologic: No focal deficits.   Lab Results  Component Value Date   CREATININE 0.67 01/22/2022   BUN 13 01/22/2022   NA 145 (H) 01/22/2022   K 3.8 01/22/2022   CL 98 01/22/2022   CO2 33 (H) 01/22/2022   Lab  Results  Component Value Date   ALT 3 01/22/2022   AST 9 01/22/2022   ALKPHOS 98 01/22/2022   BILITOT 0.2 01/22/2022   Lab Results  Component Value Date   HGBA1C 5.4 01/22/2022   HGBA1C 5.6 08/21/2021   HGBA1C 5.8 11/08/2020   HGBA1C 5.8 (H) 11/08/2019   HGBA1C 5.8 10/26/2018   Lab Results  Component Value Date   INSULIN 11.5 01/22/2022    INSULIN 12.0 08/21/2021   Lab Results  Component Value Date   TSH 4.230 08/21/2021   Lab Results  Component Value Date   CHOL 134 08/21/2021   HDL 41 08/21/2021   LDLCALC 76 08/21/2021   LDLDIRECT 150.5 06/20/2008   TRIG 88 08/21/2021   CHOLHDL 4 04/17/2021   Lab Results  Component Value Date   VD25OH 53.8 01/22/2022   VD25OH 10.5 (L) 08/21/2021   Lab Results  Component Value Date   WBC 6.3 11/08/2020   HGB 12.3 11/08/2020   HCT 38.1 11/08/2020   MCV 92.0 11/08/2020   PLT 264.0 11/08/2020   No results found for: "IRON", "TIBC", "FERRITIN"   Attestation Statements:   Reviewed by clinician on day of visit: allergies, medications, problem list, medical history, surgical history, family history, social history, and previous encounter notes.  I have reviewed the above documentation for accuracy and completeness, and I agree with the above. -  Bralen Wiltgen d. Starlett Pehrson, NP-C

## 2022-05-22 DIAGNOSIS — J449 Chronic obstructive pulmonary disease, unspecified: Secondary | ICD-10-CM | POA: Diagnosis not present

## 2022-05-22 DIAGNOSIS — I10 Essential (primary) hypertension: Secondary | ICD-10-CM

## 2022-05-26 DIAGNOSIS — Z961 Presence of intraocular lens: Secondary | ICD-10-CM | POA: Diagnosis not present

## 2022-05-26 DIAGNOSIS — H2511 Age-related nuclear cataract, right eye: Secondary | ICD-10-CM | POA: Diagnosis not present

## 2022-05-27 DIAGNOSIS — H25042 Posterior subcapsular polar age-related cataract, left eye: Secondary | ICD-10-CM | POA: Diagnosis not present

## 2022-05-27 DIAGNOSIS — H2512 Age-related nuclear cataract, left eye: Secondary | ICD-10-CM | POA: Diagnosis not present

## 2022-05-27 DIAGNOSIS — H25012 Cortical age-related cataract, left eye: Secondary | ICD-10-CM | POA: Diagnosis not present

## 2022-06-04 DIAGNOSIS — Z961 Presence of intraocular lens: Secondary | ICD-10-CM | POA: Diagnosis not present

## 2022-06-04 DIAGNOSIS — J9611 Chronic respiratory failure with hypoxia: Secondary | ICD-10-CM | POA: Diagnosis not present

## 2022-06-04 DIAGNOSIS — H2511 Age-related nuclear cataract, right eye: Secondary | ICD-10-CM | POA: Diagnosis not present

## 2022-06-10 ENCOUNTER — Ambulatory Visit (INDEPENDENT_AMBULATORY_CARE_PROVIDER_SITE_OTHER): Payer: Medicare Other | Admitting: Adult Health

## 2022-06-10 ENCOUNTER — Encounter (INDEPENDENT_AMBULATORY_CARE_PROVIDER_SITE_OTHER): Payer: Self-pay | Admitting: Adult Health

## 2022-06-10 DIAGNOSIS — Z6841 Body Mass Index (BMI) 40.0 and over, adult: Secondary | ICD-10-CM

## 2022-06-10 DIAGNOSIS — E559 Vitamin D deficiency, unspecified: Secondary | ICD-10-CM | POA: Diagnosis not present

## 2022-06-10 DIAGNOSIS — E669 Obesity, unspecified: Secondary | ICD-10-CM | POA: Diagnosis not present

## 2022-06-10 DIAGNOSIS — I10 Essential (primary) hypertension: Secondary | ICD-10-CM | POA: Diagnosis not present

## 2022-06-10 DIAGNOSIS — Z87891 Personal history of nicotine dependence: Secondary | ICD-10-CM

## 2022-06-10 DIAGNOSIS — J449 Chronic obstructive pulmonary disease, unspecified: Secondary | ICD-10-CM | POA: Diagnosis not present

## 2022-06-10 MED ORDER — VITAMIN D (ERGOCALCIFEROL) 1.25 MG (50000 UNIT) PO CAPS: 50000.0000 [IU] | ORAL_CAPSULE | ORAL | 0 refills | Status: DC

## 2022-06-10 NOTE — Progress Notes (Signed)
WEIGHT SUMMARY AND BIOMETRICS  Vitals Temp: 97.6 F (36.4 C) BP: 134/74 Pulse Rate: 67 SpO2: 96 %   Anthropometric Measurements Height: 5\' 3"  (1.6 m) Weight: 237 lb (107.5 kg) BMI (Calculated): 41.99 Weight at Last Visit: 237lb Weight Lost Since Last Visit: 0 Weight Gained Since Last Visit: 0 Starting Weight: 269lb Total Weight Loss (lbs): 32 lb (14.5 kg)   Body Composition  Body Fat %: 56.3 % Fat Mass (lbs): 133.6 lbs Muscle Mass (lbs): 98.6 lbs Visceral Fat Rating : 20   Other Clinical Data Fasting: no Labs: no Today's Visit #: 13 Starting Date: 08/21/21    Chief Complaint:   OBESITY Laurie Dominguez is here to discuss her progress with her obesity treatment plan. She is on the the Category 3 Plan and states she is following her eating plan approximately 80 % of the time.  She states she is not currently exercising.   Interim History:  She and her husband have yet to start banded exercise, however plan on beginning a regime this Spring. She has been on Ozempic 0.5mg  weekly since August 2023- managed by her PCP/Laurie Nafziger, NP-C Her appetite is suppressed on this dose, therefore has not been titrated up.  Of Note- her husband is at [place] during OV  Subjective:   1. COPD mixed type [facility]) Pulmonology OV Notes 05/02/2022 Pt. Presents for follow up. She was last seen in the office 01/2021 when she was evaluated for OSA. Sleep study showed she did not have OSA, but she did have nocturnal desaturations. She currently wears oxygen at 3 L [place] at bedtime.  She states she has been doing well. She is here in a motorized wheel chair. She is wearing her oxygen at 2.5 to 3 L [place]. Her maintenance is Symbicort. She also has a rescue inhaler that she uses very rarely. She states she needs her rescue more in the summer than in the winter. She does have issues with her sinuses with weather changes. She does use Flonase when her sinuses are acting up. She also takes Mucinex at  bedtime. She feels her COPD has been stable. No significant flare.  She denies any wheezing She no longer needs her lasix since she has had weight loss Assessment/Plan  nocturnal hypoxemia Plan - Wear oxygen at 3 L [place] at bedtime - Avoid alcohol, sedatives and other CNS depressants that may worsen sleep apnea and disrupt    normal sleep architecture. - Sleep hygiene should be optimized - Work on weight loss, and increasing exercise as able. - Follow up with Laurie Dominguez in 6 months or sooner as needed      Chronic Hypoxic Respiratory Failure Does not Qualify for Inogen as the pulsed oxygen max does not meet her ambulatory needs.  Plan Remember oxygen saturations must always be > 88%. It is important to wear your oxygen at all times.  Remember to monitor your oxygen levels with an oxygen saturation monitor. 2. Essential hypertension BP improved at OV  3. Vitamin D deficiency  Latest Reference Range & Units 01/22/22 13:04  Vitamin D, 25-Hydroxy 30.0 - 100.0 ng/mL 53.8  She is on weekly Ergocalciferol- denies N/V/Muscle Weakness She endorses chronic fatigue  Assessment/Plan:   1. COPD mixed type ([place]) Continue with weight loss efforts and supplemental O2 per Pulm  2. Essential hypertension Check Labs at next OV  3. Vitamin D deficiency Refill - Vitamin D, Ergocalciferol, (DRISDOL) 1.25 MG (50000 UNIT) CAPS capsule; Take 1 capsule (50,000 Units total)  by mouth every 7 (seven) days.  Dispense: 4 capsule; Refill: 0 Check Labs at next OV  5. Obesity, Current BMI 41.99  Laurie Dominguez is currently in the action stage of change. As such, her goal is to continue with weight loss efforts. She has agreed to the Category 3 Plan.   Handout: Prepared Breakfast Options  Exercise goals: Older adults should do exercises that maintain or improve balance if they are at risk of falling.  Older adults should determine their level of effort for physical activity relative to their level of fitness.   Older adults with chronic conditions should understand whether and how their conditions affect their ability to do regular physical activity safely.  Behavioral modification strategies: increasing lean protein intake, decreasing simple carbohydrates, increasing vegetables, increasing water intake, and planning for success.  Laurie Dominguez has agreed to follow-up with our clinic in 4 weeks. She was informed of the importance of frequent follow-up visits to maximize her success with intensive lifestyle modifications for her multiple health conditions.   Check Fasting Labs at next OV!  Objective:   Blood pressure 134/74, pulse 67, temperature 97.6 F (36.4 C), height 5\' 3"  (1.6 m), weight 237 lb (107.5 kg), SpO2 96 %. Body mass index is 41.98 kg/m.  General: Cooperative, alert, well developed, in no acute distress. HEENT: Conjunctivae and lids unremarkable. Cardiovascular: Regular rhythm.  Lungs: Normal work of breathing. Neurologic: No focal deficits.   Lab Results  Component Value Date   CREATININE 0.67 01/22/2022   BUN 13 01/22/2022   NA 145 (H) 01/22/2022   K 3.8 01/22/2022   CL 98 01/22/2022   CO2 33 (H) 01/22/2022   Lab Results  Component Value Date   ALT 3 01/22/2022   AST 9 01/22/2022   ALKPHOS 98 01/22/2022   BILITOT 0.2 01/22/2022   Lab Results  Component Value Date   HGBA1C 5.4 01/22/2022   HGBA1C 5.6 08/21/2021   HGBA1C 5.8 11/08/2020   HGBA1C 5.8 (H) 11/08/2019   HGBA1C 5.8 10/26/2018   Lab Results  Component Value Date   INSULIN 11.5 01/22/2022   INSULIN 12.0 08/21/2021   Lab Results  Component Value Date   TSH 4.230 08/21/2021   Lab Results  Component Value Date   CHOL 134 08/21/2021   HDL 41 08/21/2021   LDLCALC 76 08/21/2021   LDLDIRECT 150.5 06/20/2008   TRIG 88 08/21/2021   CHOLHDL 4 04/17/2021   Lab Results  Component Value Date   VD25OH 53.8 01/22/2022   VD25OH 10.5 (L) 08/21/2021   Lab Results  Component Value Date   WBC 6.3  11/08/2020   HGB 12.3 11/08/2020   HCT 38.1 11/08/2020   MCV 92.0 11/08/2020   PLT 264.0 11/08/2020   No results found for: "IRON", "TIBC", "FERRITIN"   Attestation Statements:   Reviewed by clinician on day of visit: allergies, medications, problem list, medical history, surgical history, family history, social history, and previous encounter notes.  I have reviewed the above documentation for accuracy and completeness, and I agree with the above. -  Fay Bagg d. Miguel Medal, NP-C

## 2022-06-16 DIAGNOSIS — Z961 Presence of intraocular lens: Secondary | ICD-10-CM | POA: Diagnosis not present

## 2022-06-16 DIAGNOSIS — H2512 Age-related nuclear cataract, left eye: Secondary | ICD-10-CM | POA: Diagnosis not present

## 2022-06-19 ENCOUNTER — Other Ambulatory Visit: Payer: Self-pay | Admitting: Acute Care

## 2022-06-20 ENCOUNTER — Ambulatory Visit (INDEPENDENT_AMBULATORY_CARE_PROVIDER_SITE_OTHER): Payer: Medicare Other

## 2022-06-20 DIAGNOSIS — J449 Chronic obstructive pulmonary disease, unspecified: Secondary | ICD-10-CM

## 2022-06-20 DIAGNOSIS — I10 Essential (primary) hypertension: Secondary | ICD-10-CM

## 2022-06-20 NOTE — Chronic Care Management (AMB) (Signed)
Chronic Care Management   CCM RN Visit Note  06/20/2022 Name: Laurie Dominguez MRN: [id number] DOB: 1950-05-07  Subjective: Laurie Dominguez is a 72 y.o. year old female who is a primary care patient of Shirline Frees, NP. The patient was referred to the Chronic Care Management team for assistance with care management needs subsequent to provider initiation of CCM services and plan of care.    Today's Visit:  Engaged with patient by telephone for follow up visit.     Goals Addressed             This Visit's Progress    Goal: CCM (COPD) Expected Outcome: Monitor, Self-Manage and Reduce Symptoms of Chronic Obstructive Pulmonary Disease       Current Barriers:  Chronic Disease Management support and education needs related to COPD  Planned Interventions: Reviewed provider's plan for management of COPD. Reviewed medications. Symptoms remain well controlled with Breztri and Symbicort. Experienced cough and congestion with season change but overall reports doing well. Reports not requiring recent use of rescue inhaler.  Reports using continuous oxygen as recommended. Advised to continue assessing daily. Reminded of need to notify a provider if experiencing moderate symptoms for greater than 48 hours without improvement.  Reviewed strategies to reduce risk of respiratory infection.  Reviewed worsening symptoms that require immediate medical attention.     Wt Readings from Last 3 Encounters:  05/02/22 246 lb 6.4 oz (111.8 kg)  04/21/22 239 lb (108.4 kg)  03/25/22 241 lb (109.3 kg)      Symptom Management: Attend medical appointments as scheduled Take medications and use inhalers as prescribed Identify and remove indoor air pollutants Limit outdoor activity during cold weather Assess symptoms daily Follow rescue plan if symptoms flare-up Contact provider for new symptoms or concerns  Follow Up Plan:   Will follow up next month     Goal: CCM (Hypertension) Expected  Outcome: Monitor, Self-Manage and Reduce Symptoms of Hypertension       Current Barriers:  Chronic Disease Management support and education needs related to Hypertension  Planned Interventions: Reviewed provider's plan for hypertension management. Reports compliance with plan. Taking medications and monitoring BP as advised. Complete cataract extractions with Dr. Darel Hong earlier this month.  Reports readings have been within range. Advised record readings. Reviewed parameters for notifying a provider. Reviewed symptoms. Denies episodes of chest pain, palpitations, headaches, or dizziness.  Reviewed plan for nutrition and exercise. [facility] Reports monitoring weight and adhering to recommended diet plan. Reports doing very well with decreasing simple carbs and adhering to recommended intake of fruits, vegetables, and lean proteins. Ability to exercise remains limited. She is considering water aerobics. Reviewed s/sx of heart attack, stroke and worsening symptoms that require immediate medical attention.  Wt Readings from Last 3 Encounters:  06/10/22 237 lb (107.5 kg)  05/19/22 237 lb (107.5 kg)  05/02/22 246 lb 6.4 oz (111.8 kg)     BP Readings from Last 3 Encounters:  06/10/22 134/74  05/19/22 137/64  05/02/22 (!) 142/84        Symptom Management: Take medications as prescribed   Attend all scheduled provider appointments Call pharmacy for medication refills 3-7 days in advance of running out of medications Check blood pressure and record readings Continue eating whole grains, fruits and vegetables, lean meats and healthy fats Limit sodium intake Engage in low impact activity as tolerated Call provider office for new concerns or questions    Follow Up Plan:  Will follow up next month           PLAN: A member of the care management team will follow up next month.    Katha Cabal RN Care Manager/Chronic Care  Management [phone]

## 2022-06-22 DIAGNOSIS — J449 Chronic obstructive pulmonary disease, unspecified: Secondary | ICD-10-CM | POA: Diagnosis not present

## 2022-06-22 DIAGNOSIS — I10 Essential (primary) hypertension: Secondary | ICD-10-CM

## 2022-06-25 DIAGNOSIS — H2512 Age-related nuclear cataract, left eye: Secondary | ICD-10-CM | POA: Diagnosis not present

## 2022-06-25 DIAGNOSIS — Z961 Presence of intraocular lens: Secondary | ICD-10-CM | POA: Diagnosis not present

## 2022-07-01 ENCOUNTER — Ambulatory Visit
Admission: RE | Admit: 2022-07-01 | Discharge: 2022-07-01 | Disposition: A | Discharge status: Home / Self Care | Payer: Medicare Other | Source: Ambulatory Visit | Attending: Adult Health | Admitting: Adult Health

## 2022-07-01 DIAGNOSIS — M8589 Other specified disorders of bone density and structure, multiple sites: Secondary | ICD-10-CM

## 2022-07-01 DIAGNOSIS — M810 Age-related osteoporosis without current pathological fracture: Secondary | ICD-10-CM | POA: Diagnosis not present

## 2022-07-01 DIAGNOSIS — Z780 Asymptomatic menopausal state: Secondary | ICD-10-CM | POA: Diagnosis not present

## 2022-07-02 ENCOUNTER — Telehealth: Payer: Self-pay

## 2022-07-02 ENCOUNTER — Other Ambulatory Visit: Payer: Self-pay | Admitting: Adult Health

## 2022-07-02 NOTE — Telephone Encounter (Signed)
PA is pending per [facility] Portal. Request #: [id number]

## 2022-07-02 NOTE — Telephone Encounter (Signed)
Patient is interested in Prolia injections.  Last Dexa: 07/01/2022 dx: osteoporosis Last Vit D & Calcium: 01/22/22, normal.  Benefits submitted via [employer] portal, awaiting insurance determination.

## 2022-07-03 ENCOUNTER — Telehealth: Payer: Self-pay | Admitting: Adult Health

## 2022-07-03 NOTE — Telephone Encounter (Signed)
Regarding Prolia, requesting most recent dexa scan, cmp lab work, current medications specifically calcium vitamin d and hypertension meds and if they have a history of fractures [phone] with SRN # [id number]. [phone] for peer to peer.

## 2022-07-04 NOTE — Telephone Encounter (Signed)
Extra info needed faxed to [facility].

## 2022-07-04 NOTE — Telephone Encounter (Signed)
All info faxed to [facility].

## 2022-07-04 NOTE — Telephone Encounter (Signed)
Please advise if this goes to you

## 2022-07-05 DIAGNOSIS — J9611 Chronic respiratory failure with hypoxia: Secondary | ICD-10-CM | POA: Diagnosis not present

## 2022-07-07 NOTE — Telephone Encounter (Signed)
Original request denied. New request submitted. Request #: [id number]

## 2022-07-08 NOTE — Telephone Encounter (Signed)
Request is denied. Patient has to try Fosamax, Boniva, Reclast. Has to try both oral & IV before Prolia can be approved.

## 2022-07-09 ENCOUNTER — Encounter (INDEPENDENT_AMBULATORY_CARE_PROVIDER_SITE_OTHER): Payer: Self-pay | Admitting: Adult Health

## 2022-07-09 ENCOUNTER — Ambulatory Visit (INDEPENDENT_AMBULATORY_CARE_PROVIDER_SITE_OTHER): Payer: Medicare Other | Admitting: Adult Health

## 2022-07-09 ENCOUNTER — Other Ambulatory Visit: Payer: Self-pay

## 2022-07-09 VITALS — BP 137/80 | HR 60 | Temp 98.3°F | Ht 63.0 in | Wt 234.0 lb

## 2022-07-09 DIAGNOSIS — Z6841 Body Mass Index (BMI) 40.0 and over, adult: Secondary | ICD-10-CM

## 2022-07-09 DIAGNOSIS — M8589 Other specified disorders of bone density and structure, multiple sites: Secondary | ICD-10-CM

## 2022-07-09 DIAGNOSIS — R7302 Impaired glucose tolerance (oral): Secondary | ICD-10-CM

## 2022-07-09 DIAGNOSIS — I10 Essential (primary) hypertension: Secondary | ICD-10-CM

## 2022-07-09 DIAGNOSIS — E669 Obesity, unspecified: Secondary | ICD-10-CM

## 2022-07-09 DIAGNOSIS — E559 Vitamin D deficiency, unspecified: Secondary | ICD-10-CM | POA: Diagnosis not present

## 2022-07-09 MED ORDER — ALENDRONATE SODIUM 70 MG PO TABS: 70.0000 mg | ORAL_TABLET | ORAL | 3 refills | Status: DC

## 2022-07-09 NOTE — Progress Notes (Signed)
WEIGHT SUMMARY AND BIOMETRICS  Vitals Temp: 98.3 F (36.8 C) BP: 137/80 Pulse Rate: 60 SpO2: 100 %   Anthropometric Measurements Height:  (1.6 m) Weight: 234 lb (106.1 kg) BMI (Calculated): 41.46 Weight at Last Visit: 237lb Weight Lost Since Last Visit: 3lb Weight Gained Since Last Visit: 0 Starting Weight: 269lb Total Weight Loss (lbs): 35 lb (15.9 kg)   Body Composition  Body Fat %: 56.5 % Fat Mass (lbs): 132.4 lbs Muscle Mass (lbs): 96.8 lbs Visceral Fat Rating : 20   Other Clinical Data Fasting: Yes Labs: Yes Today's Visit #: 14 Starting Date: 08/21/21    Chief Complaint:   OBESITY Laurie Dominguez is here to discuss her progress with her obesity treatment plan. She is on the the Category 3 Plan and states she is following her eating plan approximately 80 % of the time.  She states she is exercising in the form "movement of herself around home and during errands".   Interim History:  She experienced a "non-scale win" today during vital sign, she reports not shaking when completing weigh in. She would like to eventually use her walker at [facility] OV in future, instead of motorized scooter. Ms. Lisle reports using walker at home and motorized scooter at home for ambulation assistance. She denies recent falls. She is dependent on portable O2, which makes travel outside [place] proper difficult.  06/16/2022- OD cataract surgery. She held Ozempic 0.5mg  on 06/13/2022, re-started on 06/20/2022  Subjective:   1. Osteopenia of multiple sites 07/01/2022 Bone Density  ASSESSMENT: The BMD measured at Femur Neck Right is 0.655 g/cm2 with a T-score of -2.8. This patient is considered osteoporotic according to World Health Organization [facility]) criteria.  Prolia therapy denies by insurance. Bisphosphonates are covered- PCP will start her Fosomax therapy  2. Essential hypertension BP stable at OC. She denies CP with exertion. She is dependent on portable O2. She  is on: rosuvastatin (CRESTOR) 5 MG tablet  losartan-hydrochlorothiazide (HYZAAR) 100-25 MG tablet  amLODipine (NORVASC) 5 MG tablet   3. Vitamin D deficiency  Latest Reference Range & Units 08/21/21 12:47 01/22/22 13:04  Vitamin D, 25-Hydroxy 30.0 - 100.0 ng/mL 10.5 (L) 53.8  (L): Data is abnormally low She is on weeekly Ergocalciferol therapy- denies N/V/Muscle Weakness.  4. Impaired glucose tolerance  Latest Reference Range & Units 11/08/20 09:38 08/21/21 12:47 01/22/22 13:04  Glucose 70 - 99 mg/dL 161 (H) 096 (H) 97  (H): Data is abnormally high PCP provides weekly Ozempic 0.5mg  Denies mass in neck, dysphagia, dyspepsia, persistent hoarseness, abdominal pain, or N/V.  Assessment/Plan:   1. Osteopenia of multiple sites Continue Ergocalciferol and increase weight bearing activity as tolerated. Try to sit outside for at least 20 mins daily. MyChart Message sent with information on Osteopenia  2. Essential hypertension Check Labs - Comprehensive metabolic panel  3. Vitamin D deficiency Check Labs - VITAMIN D 25 Hydroxy (Vit-D Deficiency, Fractures)  4. Impaired glucose tolerance Check Labs - Insulin, random - Hemoglobin A1c  5. Obesity, Current BMI 41.46  Laurie Dominguez is currently in the action stage of change. As such, her goal is to continue with weight loss efforts. She has Dominguez to the Category 3 Plan.   Exercise goals: Older adults should follow the adult guidelines. When older adults cannot meet the adult guidelines, they should be as physically active as their abilities and conditions will allow.   Behavioral modification strategies: increasing lean protein intake, decreasing simple carbohydrates, increasing vegetables, increasing water intake, no skipping  meals, meal planning and cooking strategies, keeping healthy foods in the home, better snacking choices, and planning for success.  Laurie Dominguez has Dominguez to follow-up with our clinic in 4 weeks. She was informed of  the importance of frequent follow-up visits to maximize her success with intensive lifestyle modifications for her multiple health conditions.   Laurie Dominguez was informed we would discuss her lab results at her next visit unless there is a critical issue that needs to be addressed sooner. Laurie Dominguez to keep her next visit at the Dominguez upon time to discuss these results.  Objective:   Blood pressure 137/80, pulse 60, temperature 98.3 F (36.8 C), height  (1.6 m), weight 234 lb (106.1 kg), SpO2 100 %. Body mass index is 41.45 kg/m.  General: Cooperative, alert, well developed, in no acute distress. HEENT: Conjunctivae and lids unremarkable. Cardiovascular: Regular rhythm.  Lungs: Normal work of breathing. Neurologic: No focal deficits.   Lab Results  Component Value Date   CREATININE 0.67 01/22/2022   BUN 13 01/22/2022   NA 145 (H) 01/22/2022   K 3.8 01/22/2022   CL 98 01/22/2022   CO2 33 (H) 01/22/2022   Lab Results  Component Value Date   ALT 3 01/22/2022   AST 9 01/22/2022   ALKPHOS 98 01/22/2022   BILITOT 0.2 01/22/2022   Lab Results  Component Value Date   HGBA1C 5.4 01/22/2022   HGBA1C 5.6 08/21/2021   HGBA1C 5.8 11/08/2020   HGBA1C 5.8 (H) 11/08/2019   HGBA1C 5.8 10/26/2018   Lab Results  Component Value Date   INSULIN 11.5 01/22/2022   INSULIN 12.0 08/21/2021   Lab Results  Component Value Date   TSH 4.230 08/21/2021   Lab Results  Component Value Date   CHOL 134 08/21/2021   HDL 41 08/21/2021   LDLCALC 76 08/21/2021   LDLDIRECT 150.5 06/20/2008   TRIG 88 08/21/2021   CHOLHDL 4 04/17/2021   Lab Results  Component Value Date   VD25OH 53.8 01/22/2022   VD25OH 10.5 (L) 08/21/2021   Lab Results  Component Value Date   WBC 6.3 11/08/2020   HGB 12.3 11/08/2020   HCT 38.1 11/08/2020   MCV 92.0 11/08/2020   PLT 264.0 11/08/2020   No results found for: "IRON", "TIBC", "FERRITIN"  Attestation Statements:   Reviewed by clinician on day  of visit: allergies, medications, problem list, medical history, surgical history, family history, social history, and previous encounter notes.  I have reviewed the above documentation for accuracy and completeness, and I agree with the above. -  Abdo Denault d. Cirilo Canner, NP-C

## 2022-07-09 NOTE — Telephone Encounter (Signed)
Noted! Rx sent to pharmacy. Pt notified of update 

## 2022-07-10 LAB — VITAMIN D 25 HYDROXY (VIT D DEFICIENCY, FRACTURES): Vit D, 25-Hydroxy: 53.9 ng/mL (ref 30.0–100.0)

## 2022-07-11 LAB — COMPREHENSIVE METABOLIC PANEL
ALT: 8 IU/L (ref 0–32)
AST: 10 IU/L (ref 0–40)
Albumin/Globulin Ratio: 1.8 (ref 1.2–2.2)
Albumin: 4.1 g/dL (ref 3.8–4.8)
Alkaline Phosphatase: 100 IU/L (ref 44–121)
BUN/Creatinine Ratio: 20 (ref 12–28)
BUN: 12 mg/dL (ref 8–27)
Bilirubin Total: 0.4 mg/dL (ref 0.0–1.2)
CO2: 29 mmol/L (ref 20–29)
Calcium: 9.3 mg/dL (ref 8.7–10.3)
Chloride: 100 mmol/L (ref 96–106)
Creatinine, Ser: 0.61 mg/dL (ref 0.57–1.00)
Globulin, Total: 2.3 g/dL (ref 1.5–4.5)
Glucose: 93 mg/dL (ref 70–99)
Potassium: 3.6 mmol/L (ref 3.5–5.2)
Sodium: 145 mmol/L — ABNORMAL HIGH (ref 134–144)
Total Protein: 6.4 g/dL (ref 6.0–8.5)
eGFR: 94 mL/min/{1.73_m2} (ref >59)

## 2022-07-11 LAB — HEMOGLOBIN A1C
Est. average glucose Bld gHb Est-mCnc: 108 mg/dL
Hgb A1c MFr Bld: 5.4 % (ref 4.8–5.6)

## 2022-07-11 LAB — INSULIN, RANDOM: INSULIN: 9.2 u[IU]/mL (ref 2.6–24.9)

## 2022-08-04 DIAGNOSIS — J9611 Chronic respiratory failure with hypoxia: Secondary | ICD-10-CM | POA: Diagnosis not present

## 2022-08-06 ENCOUNTER — Ambulatory Visit (INDEPENDENT_AMBULATORY_CARE_PROVIDER_SITE_OTHER): Payer: Medicare Other | Admitting: Adult Health

## 2022-08-08 ENCOUNTER — Telehealth: Payer: Self-pay

## 2022-08-08 ENCOUNTER — Encounter: Payer: Self-pay | Admitting: Adult Health

## 2022-08-08 NOTE — Telephone Encounter (Signed)
Patient called to say that after two doses of fosamax she was having terrible muscle aches and having a hard time moving, she stopped two weeks ago and is starting to feel better.  Called to notify that she would no longer be taking this medication. Wants to try calcium + Vit D supplementation with weight-baring exercise first before discussing any other prescription treatment options.  Forwarded to PCP for awareness.  Sherrill Raring Clinical Pharmacist [phone]

## 2022-08-12 ENCOUNTER — Other Ambulatory Visit (INDEPENDENT_AMBULATORY_CARE_PROVIDER_SITE_OTHER): Payer: Self-pay | Admitting: Adult Health

## 2022-08-12 DIAGNOSIS — E559 Vitamin D deficiency, unspecified: Secondary | ICD-10-CM

## 2022-08-13 ENCOUNTER — Ambulatory Visit (INDEPENDENT_AMBULATORY_CARE_PROVIDER_SITE_OTHER): Payer: Medicare Other | Admitting: Adult Health

## 2022-08-13 ENCOUNTER — Encounter: Payer: Self-pay | Admitting: Adult Health

## 2022-08-13 ENCOUNTER — Other Ambulatory Visit (INDEPENDENT_AMBULATORY_CARE_PROVIDER_SITE_OTHER): Payer: Self-pay | Admitting: Adult Health

## 2022-08-13 VITALS — BP 126/70 | HR 26 | Temp 98.0°F | Ht 63.0 in | Wt 237.0 lb

## 2022-08-13 DIAGNOSIS — M25561 Pain in right knee: Secondary | ICD-10-CM | POA: Diagnosis not present

## 2022-08-13 DIAGNOSIS — M810 Age-related osteoporosis without current pathological fracture: Secondary | ICD-10-CM | POA: Diagnosis not present

## 2022-08-13 DIAGNOSIS — R7302 Impaired glucose tolerance (oral): Secondary | ICD-10-CM

## 2022-08-13 DIAGNOSIS — G8929 Other chronic pain: Secondary | ICD-10-CM

## 2022-08-13 DIAGNOSIS — E559 Vitamin D deficiency, unspecified: Secondary | ICD-10-CM

## 2022-08-13 DIAGNOSIS — M25562 Pain in left knee: Secondary | ICD-10-CM | POA: Diagnosis not present

## 2022-08-13 MED ORDER — METHYLPREDNISOLONE ACETATE 80 MG/ML IJ SUSP
80.0000 mg | Freq: Once | INTRAMUSCULAR | Status: AC
Start: 2022-08-13 — End: 2022-08-13
  Administered 2022-08-13: 80 mg via INTRA_ARTICULAR

## 2022-08-13 NOTE — Progress Notes (Signed)
Subjective:    Patient ID: Laurie Dominguez, female    DOB: Feb 01, 1951, 72 y.o.   MRN: [id number]  Knee Pain    72 year old female who  has a past medical history of Colon polyp (08/16/2004), COPD (chronic obstructive pulmonary disease) (HCC), Depression, GERD (gastroesophageal reflux disease), High cholesterol, Hypertension, Joint pain, Nephrolithiasis, Obesity, SOB (shortness of breath), and Swelling of lower extremity.  She presents to the office today for chronic bilateral knee pain. She has end stage osteoarthritis. She had a steroid injection in 04/2021 and has responded well from steroid injections. Pain has started to come back over the last few months.   Unfortunately she had severe muscle and joint pain after starting Fosamax, she stopped Fosamax and her symptoms improved.  Obesity - she is being seen by [facility] and is on Ozempic 0.5 mg for the last 8 months. Her weight loss has hit a plateau  Wt Readings from Last 10 Encounters:  08/13/22 237 lb (107.5 kg)  07/09/22 234 lb (106.1 kg)  06/10/22 237 lb (107.5 kg)  05/19/22 237 lb (107.5 kg)  05/02/22 246 lb 6.4 oz (111.8 kg)  04/21/22 239 lb (108.4 kg)  03/25/22 241 lb (109.3 kg)  02/24/22 246 lb (111.6 kg)  01/22/22 247 lb (112 kg)  12/24/21 249 lb (112.9 kg)  ]   Review of Systems See HPI   Past Medical History:  Diagnosis Date   Colon polyp 08/16/2004   Hyperplastic   COPD (chronic obstructive pulmonary disease) (HCC)    Depression    GERD (gastroesophageal reflux disease)    High cholesterol    Hypertension    Joint pain    Nephrolithiasis    Obesity    SOB (shortness of breath)    Swelling of lower extremity     Social History   Socioeconomic History   Marital status: Married    Spouse name: FW   Number of children: Not on file   Years of education: Not on file   Highest education level: Not on file  Occupational History   Occupation: Retired  Tobacco Use   Smoking  status: Former    Packs/day: 2.00    Years: 40.00    Additional pack years: 0.00    Total pack years: 80.00    Types: Cigarettes    Quit date: 03/24/2006    Years since quitting: 16.4   Smokeless tobacco: Never  Vaping Use   Vaping Use: Never used  Substance and Sexual Activity   Alcohol use: No    Alcohol/week: 0.0 standard drinks of alcohol   Drug use: No   Sexual activity: Not on file  Other Topics Concern   Not on file  Social History Narrative   Not on file   Social Determinants of Health   Financial Resource Strain: Low Risk  (02/28/2022)   Overall Financial Resource Strain (CARDIA)    Difficulty of Paying Living Expenses: Not very hard  Food Insecurity: No Food Insecurity (02/28/2022)   Hunger Vital Sign    Worried About Running Out of Food in the Last Year: Never true    Ran Out of Food in the Last Year: Never true  Transportation Needs: No Transportation Needs (02/28/2022)   PRAPARE - Administrator, Civil Service (Medical): No    Lack of Transportation (Non-Medical): No  Physical Activity: Inactive (11/19/2021)   Exercise Vital Sign    Days of Exercise per Week: 0 days  Minutes of Exercise per Session: 0 min  Stress: No Stress Concern Present (02/28/2022)   [employer] of Occupational Health - Occupational Stress Questionnaire    Feeling of Stress : Not at all  Social Connections: Moderately Integrated (02/28/2022)   Social Connection and Isolation Panel [NHANES]    Frequency of Communication with Friends and Family: More than three times a week    Frequency of Social Gatherings with Friends and Family: More than three times a week    Attends Religious Services: More than 4 times per year    Active Member of [employer] or Organizations: No    Attends Banker Meetings: Never    Marital Status: Married  Catering manager Violence: Not At Risk (11/07/2020)   Humiliation, Afraid, Rape, and Kick questionnaire    Fear of Current or Ex-Partner:  No    Emotionally Abused: No    Physically Abused: No    Sexually Abused: No    Past Surgical History:  Procedure Laterality Date   BREAST BIOPSY Left 12/2017   CHOLECYSTECTOMY     COLONOSCOPY  2006   DILATION AND CURETTAGE OF UTERUS     MOHS SURGERY     TONSILLECTOMY      Family History  Problem Relation Age of Onset   Cancer Mother        unknown primary   Liver cancer Mother        unknown primary   Other Father        spinal cord tumor   Cancer Father    Alcoholism Father    Breast cancer Neg Hx     Allergies  Allergen Reactions   Codeine Phosphate    Fosamax [Alendronate] Other (See Comments)    Muscle aches   Simvastatin     Myalgia     Current Outpatient Medications on File Prior to Visit  Medication Sig Dispense Refill   albuterol (VENTOLIN HFA) 108 (90 Base) MCG/ACT inhaler USE 2 INHALATIONS BY MOUTH  EVERY 6 HOURS AS NEEDED FOR WHEEZING OR SHORTNESS OF  BREATH 34 g 3   amLODipine (NORVASC) 5 MG tablet TAKE 1 TABLET BY MOUTH DAILY 100 tablet 2   diazepam (VALIUM) 2 MG tablet Take 1 tablet (2 mg total) by mouth at bedtime as needed for anxiety or muscle spasms. 30 tablet 0   losartan-hydrochlorothiazide (HYZAAR) 100-25 MG tablet TAKE 1 TABLET BY MOUTH  DAILY 90 tablet 3   meclizine (ANTIVERT) 25 MG tablet Take 1 tablet (25 mg total) by mouth daily as needed. 90 tablet 1   omeprazole (PRILOSEC) 20 MG capsule TAKE 1 CAPSULE BY MOUTH  DAILY 90 capsule 3   OZEMPIC, 0.25 OR 0.5 MG/DOSE, 2 MG/3ML SOPN INJECT 0.25 MG INTO THE SKIN ONCE A WEEK 3 mL 0   PARoxetine (PAXIL) 30 MG tablet TAKE 2 TABLETS BY MOUTH IN  THE MORNING (Patient taking differently: Take 30 mg by mouth daily.) 180 tablet 3   rosuvastatin (CRESTOR) 5 MG tablet TAKE 1 TABLET BY MOUTH  EVERY OTHER DAY 45 tablet 3   Semaglutide,0.25 or 0.5MG /DOS, 2 MG/3ML SOPN Inject 0.5 mg into the skin once a week. 3 mL 2   SYMBICORT 160-4.5 MCG/ACT inhaler USE 2 INHALATIONS BY MOUTH TWICE DAILY NEED OFFICE VISIT FOR  ANY  ADDITIONAL REFILLS 30.6 g 3   traMADol (ULTRAM) 50 MG tablet TAKE 1 TABLET BY MOUTH  EVERY 6 HOURS AS NEEDED FOR MODERATE PAIN OR SEVERE  PAIN 30 tablet 2  triamcinolone (KENALOG) 0.025 % cream Apply 1 application topically 2 (two) times daily.     triamcinolone (NASACORT) 55 MCG/ACT AERO nasal inhaler Place 2 sprays into the nose daily. (Patient taking differently: Place 2 sprays into the nose as needed.) 32.4 mL 3   Vitamin D, Ergocalciferol, (DRISDOL) 1.25 MG (50000 UNIT) CAPS capsule Take 1 capsule (50,000 Units total) by mouth every 7 (seven) days. 4 capsule 0   No current facility-administered medications on file prior to visit.    BP 126/70   Pulse (!) 26   Temp 98 F (36.7 C) (Oral)   Ht 5\' 3"  (1.6 m)   Wt 237 lb (107.5 kg)   SpO2 96%   BMI 41.98 kg/m       Objective:   Physical Exam Vitals and nursing note reviewed.  Constitutional:      Appearance: Normal appearance.  Cardiovascular:     Rate and Rhythm: Normal rate and regular rhythm.     Pulses: Normal pulses.     Heart sounds: Normal heart sounds.  Pulmonary:     Effort: Pulmonary effort is normal.     Breath sounds: Normal breath sounds.  Musculoskeletal:        General: Normal range of motion.  Skin:    General: Skin is warm and dry.     Capillary Refill: Capillary refill takes less than 2 seconds.  Neurological:     Mental Status: She is alert.     Gait: Gait abnormal (in motorized wheelchair).  Psychiatric:        Mood and Affect: Mood normal.        Behavior: Behavior normal.        Thought Content: Thought content normal.        Judgment: Judgment normal.       Assessment & Plan:  1. Impaired glucose tolerance - Will increase dose of Ozempic to 1 mg through patient assistance - Follow up in 3 months for CPE   2. Age-related osteoporosis without current pathological fracture - She does not want to try any of the other medications for osteoporosis. She will start calcium and is already  taking Vitamin D  3. Chronic pain of right knee Discussed risks and benefits of corticosteroid injection and patient consented.  After prepping skin with betadine, injected 80 mg depomedrol and 2 cc of plain xylocaine with 22 gauge one and one half inch needle using anterolateral approach and pt tolerated well.  - methylPREDNISolone acetate (DEPO-MEDROL) injection 80 mg  4. Chronic pain of left knee Discussed risks and benefits of corticosteroid injection and patient consented.  After prepping skin with betadine, injected 80 mg depomedrol and 2 cc of plain xylocaine with 22 gauge one and one half inch needle using anterolateral approach and pt tolerated well.  - methylPREDNISolone acetate (DEPO-MEDROL) injection 80 mg  Shirline Frees, NP

## 2022-08-20 ENCOUNTER — Ambulatory Visit (INDEPENDENT_AMBULATORY_CARE_PROVIDER_SITE_OTHER): Payer: Medicare Other | Admitting: Adult Health

## 2022-08-20 ENCOUNTER — Encounter (INDEPENDENT_AMBULATORY_CARE_PROVIDER_SITE_OTHER): Payer: Self-pay | Admitting: Adult Health

## 2022-08-20 VITALS — BP 125/77 | HR 62 | Temp 97.8°F | Ht 63.0 in | Wt 230.0 lb

## 2022-08-20 DIAGNOSIS — E669 Obesity, unspecified: Secondary | ICD-10-CM | POA: Diagnosis not present

## 2022-08-20 DIAGNOSIS — E559 Vitamin D deficiency, unspecified: Secondary | ICD-10-CM

## 2022-08-20 DIAGNOSIS — R7302 Impaired glucose tolerance (oral): Secondary | ICD-10-CM | POA: Diagnosis not present

## 2022-08-20 DIAGNOSIS — Z6841 Body Mass Index (BMI) 40.0 and over, adult: Secondary | ICD-10-CM

## 2022-08-20 DIAGNOSIS — Z0000 Encounter for general adult medical examination without abnormal findings: Secondary | ICD-10-CM

## 2022-08-20 MED ORDER — VITAMIN D (ERGOCALCIFEROL) 1.25 MG (50000 UNIT) PO CAPS
50000.0000 [IU] | ORAL_CAPSULE | ORAL | 0 refills | Status: DC
Start: 2022-08-20 — End: 2022-09-29

## 2022-08-20 NOTE — Progress Notes (Signed)
WEIGHT SUMMARY AND BIOMETRICS  Vitals Temp: 97.8 F (36.6 C) BP: 125/77 Pulse Rate: 62 SpO2: 91 %   Anthropometric Measurements Height: 5\' 3"  (1.6 m) Weight: 230 lb (104.3 kg) BMI (Calculated): 40.75 Weight at Last Visit: 234 lb Weight Lost Since Last Visit: 4 lb Weight Gained Since Last Visit: 0 Starting Weight: 269 lb   Body Composition  Body Fat %: 55.6 % Fat Mass (lbs): 128.2 lbs Muscle Mass (lbs): 97 lbs Visceral Fat Rating : 19   Other Clinical Data Fasting: no Labs: no Today's Visit #: 15 Starting Date: 08/21/21    Chief Complaint:   OBESITY Laurie Dominguez is here to discuss her progress with her obesity treatment plan. She is on the the Category 3 Plan and states she is following her eating plan approximately 75 % of the time. She states she is not currently exercising.   Interim History:  08/13/2022- PCP/Cory Nafziger, NP-C increased Ozempic 0.5mg  to 1mg  (IGT) She has yet to start increased dose. She has two more doses of Ozempic 0.5mg  then will increase to 1mg   Reviewed [facility] Results with pt: Muscle Mass: +0.2 lb Adipose Mass: - 4.2 lbs  Hunger/appetite-she reports stable appetite levels.  Stress- she denies an increase in stress/anxiety. She reports excellent, stable home life.  Exercise-she is trying to be as active as possible.  Hydration-she estimates to drink  Of Note- husband at [facility] during OV  Subjective:   1. Healthcare maintenance Discussed Labs 07/09/2022 CMP- Kidney fx stable Na+ slightly elevated: 145  2. Impaired glucose tolerance Discussed Labs  Latest Reference Range & Units 08/21/21 12:47 01/22/22 13:04 07/09/22 11:54  Glucose 70 - 99 mg/dL 578 (H) 97 93  (H): Data is abnormally high  Latest Reference Range & Units 07/09/22 11:54  Glucose 70 - 99 mg/dL 93  Hemoglobin I6N 4.8 - 5.6 % 5.4  Est. average glucose Bld gHb Est-mCnc mg/dL 629  INSULIN 2.6 - 52.8 uIU/mL 9.2  08/13/2022- PCP/Cory Nafziger, NP-C  increased Ozempic 0.5mg  to 1mg  (IGT) She has yet to start increased dose. She has two more doses of Ozempic 0.5mg  then will increase to 1mg  Denies mass in neck, dysphagia, dyspepsia, persistent hoarseness, abdominal pain, or N/V/C   3. Vitamin D deficiency Discussed Labs  Latest Reference Range & Units 08/21/21 12:47 01/22/22 13:04 07/09/22 11:54  Vitamin D, 25-Hydroxy 30.0 - 100.0 ng/mL 10.5 (L) 53.8 53.9  (L): Data is abnormally low She is on weekly Ergocalciferol- denies N/V/Muscle Weakness  Assessment/Plan:   1. Healthcare maintenance Continue to increase protein at meals and snacks.  2. Impaired glucose tolerance Continue weekly Ozempic PCP Do not overeat  3. Vitamin D deficiency REfill - Vitamin D, Ergocalciferol, (DRISDOL) 1.25 MG (50000 UNIT) CAPS capsule; Take 1 capsule (50,000 Units total) by mouth every 7 (seven) days.  Dispense: 4 capsule; Refill: 0  4. Obesity, Current BMI 40.8  Laurie Dominguez is currently in the action stage of change. As such, her goal is to continue with weight loss efforts. She has agreed to the Category 3 Plan.   Exercise goals: Older adults should follow the adult guidelines. When older adults cannot meet the adult guidelines, they should be as physically active as their abilities and conditions will allow.   Behavioral modification strategies: increasing lean protein intake, decreasing simple carbohydrates, increasing vegetables, increasing water intake, decreasing eating out, no skipping meals, meal planning and cooking strategies, and planning for success.  [name] has agreed to follow-up with our clinic in 4 weeks.  She was informed of the importance of frequent follow-up visits to maximize her success with intensive lifestyle modifications for her multiple health conditions.   Objective:   Blood pressure 125/77, pulse 62, temperature 97.8 F (36.6 C), height 5\' 3"  (1.6 m), weight 230 lb (104.3 kg), SpO2 91 %. Body mass index is 40.74  kg/m.  General: Cooperative, alert, well developed, in no acute distress. HEENT: Conjunctivae and lids unremarkable. Cardiovascular: Regular rhythm.  Lungs: Normal work of breathing. Neurologic: No focal deficits.   Lab Results  Component Value Date   CREATININE 0.61 07/09/2022   BUN 12 07/09/2022   NA 145 (H) 07/09/2022   K 3.6 07/09/2022   CL 100 07/09/2022   CO2 29 07/09/2022   Lab Results  Component Value Date   ALT 8 07/09/2022   AST 10 07/09/2022   ALKPHOS 100 07/09/2022   BILITOT 0.4 07/09/2022   Lab Results  Component Value Date   HGBA1C 5.4 07/09/2022   HGBA1C 5.4 01/22/2022   HGBA1C 5.6 08/21/2021   HGBA1C 5.8 11/08/2020   HGBA1C 5.8 (H) 11/08/2019   Lab Results  Component Value Date   INSULIN 9.2 07/09/2022   INSULIN 11.5 01/22/2022   INSULIN 12.0 08/21/2021   Lab Results  Component Value Date   TSH 4.230 08/21/2021   Lab Results  Component Value Date   CHOL 134 08/21/2021   HDL 41 08/21/2021   LDLCALC 76 08/21/2021   LDLDIRECT 150.5 06/20/2008   TRIG 88 08/21/2021   CHOLHDL 4 04/17/2021   Lab Results  Component Value Date   VD25OH 53.9 07/09/2022   VD25OH 53.8 01/22/2022   VD25OH 10.5 (L) 08/21/2021   Lab Results  Component Value Date   WBC 6.3 11/08/2020   HGB 12.3 11/08/2020   HCT 38.1 11/08/2020   MCV 92.0 11/08/2020   PLT 264.0 11/08/2020   No results found for: "IRON", "TIBC", "FERRITIN"  Attestation Statements:   Reviewed by clinician on day of visit: allergies, medications, problem list, medical history, surgical history, family history, social history, and previous encounter notes.  I have reviewed the above documentation for accuracy and completeness, and I agree with the above. -  Doyce Saling d. Kayleeann Huxford, NP-C

## 2022-08-21 NOTE — Progress Notes (Signed)
Care Management & Coordination Services Pharmacy Note  08/25/2022 Name:  Laurie Dominguez MRN:  [id number] DOB:  [birth year]  Summary: BP at goal <130/80 LDL at goal <100  Recommendations/Changes made from today's visit: -Counseled to continue to check BP 2x/week and keep a log -Counseled on importance of healthy diet and exercise  Follow up plan: BP/Cholesterol follow up in 3 months Pharmacist visit in 6 months   Subjective: Laurie Dominguez is an 72 y.o. year old female who is a primary patient of Shirline Frees, NP.  The care coordination team was consulted for assistance with disease management and care coordination needs.    Engaged with patient by telephone for follow up visit.  Recent office visits: 08/13/22 Shirline Frees, NP - For impaired glucose tolerance and other concerns. Increase ozempic to 1mg . Steroid injection provided  07/02/22 Shirline Frees, NP - Phone call, start fosamax once weekly  Recent consult visits: 08/20/22 William Hamburger, NP ([facility]) - For impaired glucose tolerance. No med changes  07/09/22 William Hamburger, NP ([facility]) - For ostepenia of multiple sites and other concerns.. No med changes  06/10/22 William Hamburger, NP ([facility]) - For obesity and other concerns. No med changes  05/19/22 William Hamburger, NP ([facility]) - For obesity and other concerns. STOP lasix 20mg   05/02/22 Kandice Robinsons, NP (Pulmonary) - For COPD. Start Symbicort  04/21/2022 William Hamburger NP (weight/wellness) - Patient was seen for Vitamin D deficiency and additional concerns. No medication changes.    03/25/2022 Shawn Rayburn PA-C (weight/wellness) - Patient was seen for Type 2 diabetes mellitus with other specified complication, with long-term current use of insulin  and additional concerns. No medication changes.    02/24/2022 Quillian Quince MD (weight/wellness) Patient was seen for Vitamin D deficiency and additional concerns. No medication changes.     01/22/2022 William Hamburger NP (weight/wellness) - Patient was seen for IGF and additional concerns. No medication changes.  Hospital visits: None in previous 6 months   Objective:  Lab Results  Component Value Date   CREATININE 0.61 07/09/2022   BUN 12 07/09/2022   GFR 87.00 11/08/2020   EGFR 94 07/09/2022   GFRNONAA 82 11/08/2019   GFRAA 95 11/08/2019   NA 145 (H) 07/09/2022   K 3.6 07/09/2022   CALCIUM 9.3 07/09/2022   CO2 29 07/09/2022   GLUCOSE 93 07/09/2022    Lab Results  Component Value Date/Time   HGBA1C 5.4 07/09/2022 11:54 AM   HGBA1C 5.4 01/22/2022 01:04 PM   GFR 87.00 11/08/2020 09:38 AM   GFR 90.27 10/26/2018 10:42 AM    Last diabetic Eye exam: No results found for: "HMDIABEYEEXA"  Last diabetic Foot exam: No results found for: "HMDIABFOOTEX"   Lab Results  Component Value Date   CHOL 134 08/21/2021   HDL 41 08/21/2021   LDLCALC 76 08/21/2021   LDLDIRECT 150.5 06/20/2008   TRIG 88 08/21/2021   CHOLHDL 4 04/17/2021       Latest Ref Rng & Units 07/09/2022   11:54 AM 01/22/2022    1:04 PM 08/21/2021   12:47 PM  Hepatic Function  Total Protein 6.0 - 8.5 g/dL 6.4  7.0  6.7   Albumin 3.8 - 4.8 g/dL 4.1  4.1  4.3   AST 0 - 40 IU/L 10  9  8    ALT 0 - 32 IU/L 8  3  8    Alk Phosphatase 44 - 121 IU/L 100  98  111   Total Bilirubin 0.0 - 1.2  mg/dL 0.4  0.2  0.3     Lab Results  Component Value Date/Time   TSH 4.230 08/21/2021 12:47 PM   TSH 4.11 11/08/2020 09:38 AM   FREET4 0.88 08/21/2021 12:47 PM       Latest Ref Rng & Units 11/08/2020    9:38 AM 11/08/2019   10:09 AM 10/26/2018   10:42 AM  CBC  WBC 4.0 - 10.5 K/uL 6.3  7.2  7.7   Hemoglobin 12.0 - 15.0 g/dL 16.1  09.6  04.5   Hematocrit 36.0 - 46.0 % 38.1  41.2  40.7   Platelets 150.0 - 400.0 K/uL 264.0  314  333.0     Lab Results  Component Value Date/Time   VD25OH 53.9 07/09/2022 11:54 AM   VD25OH 53.8 01/22/2022 01:04 PM    Clinical ASCVD: No  The 10-year ASCVD risk score (Arnett DK,  et al., 2019) is: 28.1%   Values used to calculate the score:     Age: 70 years     Sex: Female     Is Non-Hispanic African American: No     Diabetic: Yes     Tobacco smoker: No     Systolic Blood Pressure: 125 mmHg     Is BP treated: Yes     HDL Cholesterol: 41 mg/dL     Total Cholesterol: 134 mg/dL    DEXA: 4/0/98 RFN -2.8, Osteoporosis, could not tolerate fosamax, defers other tx outside of calcium + vit d supplementation     08/13/2022    9:22 AM 02/28/2022   11:48 AM 11/19/2021    8:33 AM  Depression screen PHQ 2/9  Decreased Interest 1 0 0  Down, Depressed, Hopeless 1 0 0  PHQ - 2 Score 2 0 0  Altered sleeping 1    Tired, decreased energy 1    Change in appetite 1    Feeling bad or failure about yourself  1    Trouble concentrating 0    Moving slowly or fidgety/restless 0    Suicidal thoughts 0    PHQ-9 Score 6    Difficult doing work/chores Somewhat difficult       Social History   Tobacco Use  Smoking Status Former   Packs/day: 2.00   Years: 40.00   Additional pack years: 0.00   Total pack years: 80.00   Types: Cigarettes   Quit date: 03/24/2006   Years since quitting: 16.4  Smokeless Tobacco Never   BP Readings from Last 3 Encounters:  08/20/22 125/77  08/13/22 126/70  07/09/22 137/80   Pulse Readings from Last 3 Encounters:  08/20/22 62  08/13/22 (!) 26  07/09/22 60   Wt Readings from Last 3 Encounters:  08/20/22 230 lb (104.3 kg)  08/13/22 237 lb (107.5 kg)  07/09/22 234 lb (106.1 kg)   BMI Readings from Last 3 Encounters:  08/20/22 40.74 kg/m  08/13/22 41.98 kg/m  07/09/22 41.45 kg/m    Allergies  Allergen Reactions   Codeine Phosphate    Fosamax [Alendronate] Other (See Comments)    Muscle aches   Simvastatin     Myalgia     Medications Reviewed Today     Reviewed by Wyvonne Lenz, CMA (Certified Medical Assistant) on 08/20/22 at 1157  Med List Status: <None>   Medication Order Taking? Sig Documenting Provider Last Dose  Status Informant  albuterol (VENTOLIN HFA) 108 (90 Base) MCG/ACT inhaler [id number]  USE 2 INHALATIONS BY MOUTH  EVERY 6 HOURS AS NEEDED FOR WHEEZING OR  SHORTNESS OF  BREATH Nafziger, Kandee Keen, NP  Active   amLODipine (NORVASC) 5 MG tablet [id number]  TAKE 1 TABLET BY MOUTH DAILY Nafziger, Kandee Keen, NP  Active   diazepam (VALIUM) 2 MG tablet [id number]  Take 1 tablet (2 mg total) by mouth at bedtime as needed for anxiety or muscle spasms. Nafziger, Kandee Keen, NP  Active   losartan-hydrochlorothiazide (HYZAAR) 100-25 MG tablet [id number]  TAKE 1 TABLET BY MOUTH  DAILY Nafziger, Kandee Keen, NP  Active   meclizine (ANTIVERT) 25 MG tablet [id number]  Take 1 tablet (25 mg total) by mouth daily as needed. Gordy Savers, MD  Active   omeprazole (PRILOSEC) 20 MG capsule [id number]  TAKE 1 CAPSULE BY MOUTH  DAILY Nafziger, Kandee Keen, NP  Active            Med Note Constance Haw, FELECIA N   Fri May 09, 2022 11:11 AM) Taking every other day  OZEMPIC, 0.25 OR 0.5 MG/DOSE, 2 MG/3ML SOPN [id number]  INJECT 0.25 MG INTO THE SKIN ONCE A WEEK Nafziger, Kandee Keen, NP  Active   PARoxetine (PAXIL) 30 MG tablet [id number]  TAKE 2 TABLETS BY MOUTH IN  THE MORNING  Patient taking differently: Take 30 mg by mouth daily.   Nafziger, Kandee Keen, NP  Active            Med Note Constance Haw, FELECIA N   Fri Feb 28, 2022 11:18 AM) Reports taking 30mg  daily  rosuvastatin (CRESTOR) 5 MG tablet [id number]  TAKE 1 TABLET BY MOUTH  EVERY OTHER DAY Nafziger, Kandee Keen, NP  Active   Semaglutide,0.25 or 0.5MG /DOS, 2 MG/3ML Namon Cirri [id number]  Inject 0.5 mg into the skin once a week. Shirline Frees, NP  Active   SYMBICORT 160-4.5 MCG/ACT inhaler [id number]  USE 2 INHALATIONS BY MOUTH TWICE DAILY NEED OFFICE VISIT FOR ANY  ADDITIONAL REFILLS Bevelyn Ngo, NP  Active   traMADol (ULTRAM) 50 MG tablet [id number]  TAKE 1 TABLET BY MOUTH  EVERY 6 HOURS AS NEEDED FOR MODERATE PAIN OR SEVERE  PAIN Nafziger, Kandee Keen, NP  Active   triamcinolone (KENALOG) 0.025 % cream [id number]  Apply 1 application  topically 2 (two) times daily. [provider]  Active   triamcinolone (NASACORT) 55 MCG/ACT AERO nasal inhaler [id number]  Place 2 sprays into the nose daily.  Patient taking differently: Place 2 sprays into the nose as needed.   Nafziger, Kandee Keen, NP  Active   Vitamin D, Ergocalciferol, (DRISDOL) 1.25 MG (50000 UNIT) CAPS capsule [id number]  Take 1 capsule (50,000 Units total) by mouth every 7 (seven) days. Julaine Fusi, NP  Active             SDOH:  (Social Determinants of Health) assessments and interventions performed: Yes SDOH Interventions    Flowsheet Row Care Coordination from 08/25/2022 in [facility] Chronic Care Management from 02/28/2022 in [facility] [place] Chronic Care Management from 01/06/2022 in [facility] [place] Chronic Care Management from 06/12/2020 in [facility] [place] Clinical Support from 11/15/2019 in [facility] [place]  SDOH Interventions       Food Insecurity Interventions Intervention Not Indicated Intervention Not Indicated -- -- Intervention Not Indicated  Housing Interventions Intervention Not Indicated Intervention Not Indicated -- -- Intervention Not Indicated  Transportation Interventions -- Intervention Not Indicated -- Intervention Not Indicated Intervention Not Indicated  Utilities Interventions -- Intervention Not Indicated -- -- --  Alcohol Usage Interventions -- Intervention Not Indicated (Score <7) -- -- --  Depression Interventions/Treatment  -- -- -- -- PHQ2-9 Score <4 Follow-up Not Indicated  Financial Strain Interventions -- Intervention Not Indicated Intervention Not Indicated Intervention Not Indicated Intervention Not Indicated  Physical Activity Interventions -- -- -- -- Intervention Not Indicated  Stress Interventions -- Intervention Not Indicated -- -- Intervention Not Indicated  Social Connections Interventions --  Intervention Not Indicated -- -- Intervention Not Indicated       Medication Assistance: Ozempic through [facility] through 03/24/2023  Medication Access: Name and location of current pharmacy:  [facility] [address] [place] - [address] [place] [address] Phone: [phone] Fax: [phone]  [facility]) - [place], [place] - [address] [facility] [address] [place] [address] [place] [address] Phone: [phone] Fax: [phone]  [id number] - [place], [place] - [address] [facility] [address] [place] [address] Phone: [phone] Fax: [phone]  [facility] - [place], [place] - [address] [place]  [address] Phone: [phone] Fax: [phone]  Within the past 30 days, how often has patient missed a dose of medication? None Is a pillbox or other method used to improve adherence? Yes  Factors that may affect medication adherence? no barriers identified Are meds synced by current pharmacy? No  Are meds delivered by current pharmacy? No  Does patient experience delays in picking up medications due to transportation concerns? No   Compliance/Adherence/Medication fill history: Care Gaps: Diabetic Eye Exam COVID Vaccine - last 12/13/21  Star-Rating Drugs: Losartan-HCTZ 100-25mg   PDC 44% --confirmed taking everyday, had backlog of pills and held on filling Ozempic - through PAP Rosuvastatin 5mg  PDC 24% --see above   Assessment/Plan Hypertension (BP goal <130/80) -Controlled -Current treatment: Amlodipine 5mg  1 qd Appropriate, Effective, Safe, Accessible Losartan-HCTZ 100-25mg  1 qd Appropriate, Effective, Safe, Accessible -Medications previously tried: Lasix  -Current home readings: has a machine at home, 2x/week, WNL -Current dietary habits:  mindful of salt intake -Current  exercise habits: working on walking and being more active with [facility] -Denies hypotensive/hypertensive symptoms -Educated on BP goals and benefits of medications for prevention of heart attack, stroke and kidney damage; Daily salt intake goal < 2300 mg; Importance of home blood pressure monitoring; Proper BP monitoring technique; -Counseled to monitor BP at home weekly, document, and provide log at future appointments -Counseled on diet and exercise extensively Recommended to continue current medication  Hyperlipidemia: (LDL goal < 100) -Controlled -Current treatment: Rosuvastatin 5mg  1 every 3 days Appropriate, Query Effective -Medications previously tried: Simvastatin  -Current dietary patterns: following a healthy diet from Healthy weight -Current exercise habits: see above -Educated on Cholesterol goals;  Benefits of statin for ASCVD risk reduction; Importance of limiting foods high in cholesterol; Exercise goal of 150 minutes per week; Strategies to manage statin-induced myalgias; -Recommended to continue current medication  Sherrill Raring Clinical Pharmacist [phone]

## 2022-08-22 ENCOUNTER — Telehealth: Payer: Self-pay

## 2022-08-22 NOTE — Progress Notes (Signed)
Care Management & Coordination Services Pharmacy Team  Reason for Encounter: Appointment Reminder  Contacted patient to confirm telephone appointment with Delano Metz, PharmD on 08/25/2022 at 11:45. Spoke with patient on 08/22/2022   Do you have any problems getting your medications? Patient denies, she does state that Losartan HCTZ and Rosuvastatin are both take every 3rd day.   What is your top health concern you would like to discuss at your upcoming visit? Patient denies  Have you seen any other providers since your last visit with PCP? Patient denies   Care Gaps: AWV - completed 11/19/2021 Last eye exam - never done Last BP - 125/77 on 08/20/2022 Last A1C - 5.4 on 07/09/2022 Covid - overdue Urine ACR - postponed   Star Rating Drug: Losartan HCTZ 100/25 mg - last filled 01/26/2022 [facility] Rosuvastatin 5 mg - last filled 12/21/2021 [facility] at [facility] Ozempic 0.25mg  - last filled through pap  Inetta Fermo [facility]  Clinical Pharmacist Assistant [phone]

## 2022-08-25 ENCOUNTER — Ambulatory Visit: Payer: Medicare Other

## 2022-09-04 DIAGNOSIS — G4733 Obstructive sleep apnea (adult) (pediatric): Secondary | ICD-10-CM | POA: Diagnosis not present

## 2022-09-04 DIAGNOSIS — J9601 Acute respiratory failure with hypoxia: Secondary | ICD-10-CM | POA: Diagnosis not present

## 2022-09-04 DIAGNOSIS — J9611 Chronic respiratory failure with hypoxia: Secondary | ICD-10-CM | POA: Diagnosis not present

## 2022-09-13 ENCOUNTER — Other Ambulatory Visit (INDEPENDENT_AMBULATORY_CARE_PROVIDER_SITE_OTHER): Payer: Self-pay | Admitting: Adult Health

## 2022-09-13 DIAGNOSIS — E559 Vitamin D deficiency, unspecified: Secondary | ICD-10-CM

## 2022-09-29 ENCOUNTER — Encounter (INDEPENDENT_AMBULATORY_CARE_PROVIDER_SITE_OTHER): Payer: Self-pay | Admitting: Adult Health

## 2022-09-29 ENCOUNTER — Ambulatory Visit (INDEPENDENT_AMBULATORY_CARE_PROVIDER_SITE_OTHER): Payer: Medicare Other | Admitting: Adult Health

## 2022-09-29 VITALS — BP 108/70 | HR 49 | Temp 98.0°F | Ht 63.0 in | Wt 221.0 lb

## 2022-09-29 DIAGNOSIS — E669 Obesity, unspecified: Secondary | ICD-10-CM

## 2022-09-29 DIAGNOSIS — E559 Vitamin D deficiency, unspecified: Secondary | ICD-10-CM

## 2022-09-29 DIAGNOSIS — Z6839 Body mass index (BMI) 39.0-39.9, adult: Secondary | ICD-10-CM | POA: Diagnosis not present

## 2022-09-29 DIAGNOSIS — I10 Essential (primary) hypertension: Secondary | ICD-10-CM | POA: Diagnosis not present

## 2022-09-29 MED ORDER — VITAMIN D (ERGOCALCIFEROL) 1.25 MG (50000 UNIT) PO CAPS
50000.0000 [IU] | ORAL_CAPSULE | ORAL | 0 refills | Status: DC
Start: 2022-09-29 — End: 2022-11-25

## 2022-09-29 NOTE — Progress Notes (Signed)
WEIGHT SUMMARY AND BIOMETRICS  Vitals Temp: 98 F (36.7 C) BP: 108/70 Pulse Rate: (!) 49 SpO2: 95 %   Anthropometric Measurements Height: 5\' 3"  (1.6 m) Weight: 221 lb (100.2 kg) BMI (Calculated): 39.16 Weight at Last Visit: 230lb Weight Lost Since Last Visit: 9lb Weight Gained Since Last Visit: 0 Starting Weight: 269lb Total Weight Loss (lbs): 48 lb (21.8 kg)   Body Composition  Body Fat %: 54.5 % Fat Mass (lbs): 120.8 lbs Muscle Mass (lbs): 95.8 lbs Visceral Fat Rating : 18   Other Clinical Data Fasting: no Labs: No Today's Visit #: 16 Starting Date: 08/21/21    Chief Complaint:   OBESITY Laurie Dominguez is here to discuss her progress with her obesity treatment plan. She is on the the Category 3 Plan and states she is following her eating plan approximately 80 % of the time. She states she is exercising NEAT- daily minutes.   Interim History:  PCP/Cory Nafziger, NP-C manages Ozempic therapy 08/13/2022- PCP/Cory Nafziger, NP-C increased Ozempic 0.5mg  to 1mg   She has tolerated increased dose- denies mass in neck, dysphagia, dyspepsia, persistent hoarseness, abdominal pain, or N/V/C   Of Note- Pt's husband at [facility] during OV  Subjective:   1. Vitamin D deficiency  Latest Reference Range & Units 08/21/21 12:47 01/22/22 13:04 07/09/22 11:54  Vitamin D, 25-Hydroxy 30.0 - 100.0 ng/mL 10.5 (L) 53.8 53.9  (L): Data is abnormally low She is on weekly Ergocalciferol- denies N/V/Muscle Weakness  2. Essential hypertension BP stable, yet soft at OV Home readings: SBP 110-130s DBP 60-70 She denies sx's of hypotension. PCP manages: losartan-hydrochlorothiazide (HYZAAR) 100-25 MG tablet  amLODipine (NORVASC) 5 MG tablet    Assessment/Plan:   1. Vitamin D deficiency Refill - Vitamin D, Ergocalciferol, (DRISDOL) 1.25 MG (50000 UNIT) CAPS capsule; Take 1 capsule (50,000 Units total) by mouth every 7 (seven) days.  Dispense: 4 capsule; Refill: 0  2. Essential  hypertension Remain well hydrated Monitor for sx's of hypotension Contact PCP is sx's develop of BP consistently <100/60 Pt and pt's husband verbalized understanding/agreement  3. Obesity, Current BMI 39.3  Laurie Dominguez is currently in the action stage of change. As such, her goal is to continue with weight loss efforts. She has agreed to the Category 3 Plan.   Exercise goals: Older adults should follow the adult guidelines. When older adults cannot meet the adult guidelines, they should be as physically active as their abilities and conditions will allow.  Older adults should do exercises that maintain or improve balance if they are at risk of falling.  Older adults should determine their level of effort for physical activity relative to their level of fitness.   Behavioral modification strategies: increasing lean protein intake, decreasing simple carbohydrates, increasing vegetables, increasing water intake, meal planning and cooking strategies, planning for success, and keeping a strict food journal.  Laurie Dominguez has agreed to follow-up with our clinic in 4 weeks. She was informed of the importance of frequent follow-up visits to maximize her success with intensive lifestyle modifications for her multiple health conditions.   Objective:   Blood pressure 108/70, pulse (!) 49, temperature 98 F (36.7 C), height 5\' 3"  (1.6 m), weight 221 lb (100.2 kg), SpO2 95 %. Body mass index is 39.15 kg/m.  General: Cooperative, alert, well developed, in no acute distress. HEENT: Conjunctivae and lids unremarkable. Cardiovascular: Regular rhythm.  Lungs: Normal work of breathing. Neurologic: No focal deficits.   Lab Results  Component Value Date   CREATININE 0.61 07/09/2022  BUN 12 07/09/2022   NA 145 (H) 07/09/2022   K 3.6 07/09/2022   CL 100 07/09/2022   CO2 29 07/09/2022   Lab Results  Component Value Date   ALT 8 07/09/2022   AST 10 07/09/2022   ALKPHOS 100 07/09/2022   BILITOT 0.4  07/09/2022   Lab Results  Component Value Date   HGBA1C 5.4 07/09/2022   HGBA1C 5.4 01/22/2022   HGBA1C 5.6 08/21/2021   HGBA1C 5.8 11/08/2020   HGBA1C 5.8 (H) 11/08/2019   Lab Results  Component Value Date   INSULIN 9.2 07/09/2022   INSULIN 11.5 01/22/2022   INSULIN 12.0 08/21/2021   Lab Results  Component Value Date   TSH 4.230 08/21/2021   Lab Results  Component Value Date   CHOL 134 08/21/2021   HDL 41 08/21/2021   LDLCALC 76 08/21/2021   LDLDIRECT 150.5 06/20/2008   TRIG 88 08/21/2021   CHOLHDL 4 04/17/2021   Lab Results  Component Value Date   VD25OH 53.9 07/09/2022   VD25OH 53.8 01/22/2022   VD25OH 10.5 (L) 08/21/2021   Lab Results  Component Value Date   WBC 6.3 11/08/2020   HGB 12.3 11/08/2020   HCT 38.1 11/08/2020   MCV 92.0 11/08/2020   PLT 264.0 11/08/2020   No results found for: "IRON", "TIBC", "FERRITIN"  Attestation Statements:   Reviewed by clinician on day of visit: allergies, medications, problem list, medical history, surgical history, family history, social history, and previous encounter notes.  I have reviewed the above documentation for accuracy and completeness, and I agree with the above. -  Reagen Haberman d. Akim Watkinson, NP-C

## 2022-10-01 ENCOUNTER — Ambulatory Visit (INDEPENDENT_AMBULATORY_CARE_PROVIDER_SITE_OTHER): Payer: Medicare Other

## 2022-10-01 ENCOUNTER — Encounter: Payer: Self-pay | Admitting: Family Medicine

## 2022-10-01 ENCOUNTER — Ambulatory Visit (INDEPENDENT_AMBULATORY_CARE_PROVIDER_SITE_OTHER): Payer: Medicare Other | Admitting: Family Medicine

## 2022-10-01 VITALS — BP 128/80 | HR 73 | Temp 98.3°F | Wt 225.0 lb

## 2022-10-01 DIAGNOSIS — M25562 Pain in left knee: Secondary | ICD-10-CM

## 2022-10-01 DIAGNOSIS — M1712 Unilateral primary osteoarthritis, left knee: Secondary | ICD-10-CM | POA: Diagnosis not present

## 2022-10-01 DIAGNOSIS — G8929 Other chronic pain: Secondary | ICD-10-CM

## 2022-10-01 MED ORDER — TRAMADOL HCL 50 MG PO TABS: 50.0000 mg | ORAL_TABLET | Freq: Three times a day (TID) | ORAL | 0 refills | Status: DC | PRN

## 2022-10-01 NOTE — Progress Notes (Signed)
   Subjective:    Patient ID: Laurie Dominguez, female    DOB: May 14, 1950, 72 y.o.   MRN: [id number]  HPI Here for left knee pain. She has chronic pain in both knees due to OA. Her orthopedist has contemplated replacement surgeries for her, but he wants her to lose weight first. While at home 4 days ago, as she started to stand up from a chair, she felt a sudden sharp pain in the lateral and posterior knee areas. This pain has continued since then. Sometimes her left knee will lock in a partly flexed position, and she has to work to straighten it out. Tylenol does not help.    Review of Systems  Constitutional: Negative.   Respiratory:  Positive for shortness of breath.   Cardiovascular: Negative.   Musculoskeletal:  Positive for arthralgias.       Objective:   Physical Exam Constitutional:      Appearance: She is obese.     Comments: In an electric scooter  Cardiovascular:     Rate and Rhythm: Normal rate and regular rhythm.     Pulses: Normal pulses.     Heart sounds: Normal heart sounds.  Pulmonary:     Effort: Pulmonary effort is normal.     Breath sounds: Normal breath sounds.  Musculoskeletal:     Comments: Left knee does not appear to be swollen. No warmth or erythema. She is moderately tender along the medial joint space, and she is very tender along the lateral joint space. No tenderness behind the knee, no Baker's cyst. Flexion and extension are full  Neurological:     Mental Status: She is alert.           Assessment & Plan:  Left knee pain. She has known OA and likely now also has a torn meniscus. We will get plain Xrays today. She can take Tramadol as needed for pain. Follow up with Kandee Keen, her PCP.  Gershon Crane, MD

## 2022-10-03 ENCOUNTER — Telehealth: Payer: Self-pay | Admitting: Adult Health

## 2022-10-03 NOTE — Telephone Encounter (Signed)
Pt called to ask for a call back to go over her imaging results, when they come in.  Pt informed NP is OOO today and Monday.

## 2022-10-03 NOTE — Telephone Encounter (Signed)
Pt did not see PCP. PT advised that when the imaging is resulted by Dr.Fry she will get a call.

## 2022-10-04 DIAGNOSIS — J9611 Chronic respiratory failure with hypoxia: Secondary | ICD-10-CM | POA: Diagnosis not present

## 2022-10-06 NOTE — Telephone Encounter (Signed)
Pt was seen by Dr. Clent Ridges on 10/01/22.  Pt is asking for a call back to go over her results.

## 2022-10-06 NOTE — Telephone Encounter (Signed)
Spoke with pt advised that Dr Clent Ridges has not received her imaging report, advised the office will call her as soon as we get them. Verbalized understanding

## 2022-10-22 ENCOUNTER — Encounter (INDEPENDENT_AMBULATORY_CARE_PROVIDER_SITE_OTHER): Payer: Self-pay

## 2022-10-30 ENCOUNTER — Ambulatory Visit (INDEPENDENT_AMBULATORY_CARE_PROVIDER_SITE_OTHER): Payer: Medicare Other | Admitting: Adult Health

## 2022-11-04 DIAGNOSIS — J9611 Chronic respiratory failure with hypoxia: Secondary | ICD-10-CM | POA: Diagnosis not present

## 2022-11-06 ENCOUNTER — Encounter (INDEPENDENT_AMBULATORY_CARE_PROVIDER_SITE_OTHER): Payer: Self-pay

## 2022-11-14 ENCOUNTER — Encounter: Payer: Self-pay | Admitting: Adult Health

## 2022-11-14 ENCOUNTER — Ambulatory Visit (INDEPENDENT_AMBULATORY_CARE_PROVIDER_SITE_OTHER): Payer: Medicare Other | Admitting: Adult Health

## 2022-11-14 VITALS — BP 120/60 | HR 67 | Temp 98.5°F | Wt 222.0 lb

## 2022-11-14 DIAGNOSIS — E785 Hyperlipidemia, unspecified: Secondary | ICD-10-CM

## 2022-11-14 DIAGNOSIS — J449 Chronic obstructive pulmonary disease, unspecified: Secondary | ICD-10-CM

## 2022-11-14 DIAGNOSIS — L409 Psoriasis, unspecified: Secondary | ICD-10-CM | POA: Diagnosis not present

## 2022-11-14 DIAGNOSIS — Z0000 Encounter for general adult medical examination without abnormal findings: Secondary | ICD-10-CM

## 2022-11-14 DIAGNOSIS — I10 Essential (primary) hypertension: Secondary | ICD-10-CM

## 2022-11-14 DIAGNOSIS — R7302 Impaired glucose tolerance (oral): Secondary | ICD-10-CM

## 2022-11-14 DIAGNOSIS — F419 Anxiety disorder, unspecified: Secondary | ICD-10-CM

## 2022-11-14 DIAGNOSIS — F32A Depression, unspecified: Secondary | ICD-10-CM | POA: Diagnosis not present

## 2022-11-14 DIAGNOSIS — M810 Age-related osteoporosis without current pathological fracture: Secondary | ICD-10-CM

## 2022-11-14 DIAGNOSIS — M170 Bilateral primary osteoarthritis of knee: Secondary | ICD-10-CM

## 2022-11-14 DIAGNOSIS — Z6841 Body Mass Index (BMI) 40.0 and over, adult: Secondary | ICD-10-CM

## 2022-11-14 LAB — CBC WITH DIFFERENTIAL/PLATELET
Basophils Absolute: 0.1 10*3/uL (ref 0.0–0.1)
Basophils Relative: 1 % (ref 0.0–3.0)
Eosinophils Absolute: 0.2 10*3/uL (ref 0.0–0.7)
Eosinophils Relative: 2.7 % (ref 0.0–5.0)
HCT: 40.2 % (ref 36.0–46.0)
Hemoglobin: 12.9 g/dL (ref 12.0–15.0)
Lymphocytes Relative: 16.2 % (ref 12.0–46.0)
Lymphs Abs: 1.4 10*3/uL (ref 0.7–4.0)
MCHC: 32.2 g/dL (ref 30.0–36.0)
MCV: 92.9 fl (ref 78.0–100.0)
Monocytes Absolute: 0.6 10*3/uL (ref 0.1–1.0)
Monocytes Relative: 6.6 % (ref 3.0–12.0)
Neutro Abs: 6.3 10*3/uL (ref 1.4–7.7)
Neutrophils Relative %: 73.5 % (ref 43.0–77.0)
Platelets: 300 10*3/uL (ref 150.0–400.0)
RBC: 4.32 Mil/uL (ref 3.87–5.11)
RDW: 13.8 % (ref 11.5–15.5)
WBC: 8.6 10*3/uL (ref 4.0–10.5)

## 2022-11-14 LAB — COMPREHENSIVE METABOLIC PANEL
ALT: 8 U/L (ref 0–35)
AST: 10 U/L (ref 0–37)
Albumin: 4 g/dL (ref 3.5–5.2)
Alkaline Phosphatase: 75 U/L (ref 39–117)
BUN: 16 mg/dL (ref 6–23)
CO2: 38 mEq/L — ABNORMAL HIGH (ref 19–32)
Calcium: 9.6 mg/dL (ref 8.4–10.5)
Chloride: 100 mEq/L (ref 96–112)
Creatinine, Ser: 0.63 mg/dL (ref 0.40–1.20)
GFR: 87.98 mL/min (ref >60.00)
Glucose, Bld: 106 mg/dL — ABNORMAL HIGH (ref 70–99)
Potassium: 3.6 mEq/L (ref 3.5–5.1)
Sodium: 144 mEq/L (ref 135–145)
Total Bilirubin: 0.4 mg/dL (ref 0.2–1.2)
Total Protein: 7.1 g/dL (ref 6.0–8.3)

## 2022-11-14 LAB — LIPID PANEL
Cholesterol: 135 mg/dL (ref 0–200)
HDL: 36.4 mg/dL — ABNORMAL LOW (ref >39.00)
LDL Cholesterol: 80 mg/dL (ref 0–99)
NonHDL: 98.76
Total CHOL/HDL Ratio: 4
Triglycerides: 95 mg/dL (ref 0.0–149.0)
VLDL: 19 mg/dL (ref 0.0–40.0)

## 2022-11-14 LAB — HEMOGLOBIN A1C: Hgb A1c MFr Bld: 5.3 % (ref 4.6–6.5)

## 2022-11-14 LAB — TSH: TSH: 3.31 u[IU]/mL (ref 0.35–5.50)

## 2022-11-14 NOTE — Progress Notes (Signed)
Subjective:    Patient ID: Laurie Dominguez, female    DOB: 03-26-1950, 72 y.o.   MRN: [id number]  HPI  Patient presents for yearly preventative medicine examination. She is a pleasant 72 year old female who  has a past medical history of Colon polyp (08/16/2004), COPD (chronic obstructive pulmonary disease) (HCC), Depression, GERD (gastroesophageal reflux disease), High cholesterol, Hypertension, Joint pain, Nephrolithiasis, Obesity, SOB (shortness of breath), and Swelling of lower extremity.  Obesity/impaired glucose tolerance- she is being seen by the [facility] and is on Ozempic 1.0 mg weekly.  Lab Results  Component Value Date   HGBA1C 5.4 07/09/2022   Wt Readings from Last 10 Encounters:  11/14/22 222 lb (100.7 kg)  10/01/22 225 lb (102.1 kg)  09/29/22 221 lb (100.2 kg)  08/20/22 230 lb (104.3 kg)  08/13/22 237 lb (107.5 kg)  07/09/22 234 lb (106.1 kg)  06/10/22 237 lb (107.5 kg)  05/19/22 237 lb (107.5 kg)  05/02/22 246 lb 6.4 oz (111.8 kg)  04/21/22 239 lb (108.4 kg)   Osteoporosis -has been on Fosamax in the past but stopped this due to joint pain.  She has refused other medications.  Hypertension-managed with Norvasc 5 mg, Hyzaar 100-25 mg daily, and Lasix 20 mg daily PRN .  She denies dizziness, lightheadedness, chest pain, or shortness of breath BP Readings from Last 3 Encounters:  11/14/22 120/60  10/01/22 128/80  09/29/22 108/70    COPD-uses nocturnal oxygen as well as Symbicort.  She is followed by pulmonary.  Feels as though her symptoms are controlled  Anxiety/depression-takes Paxil 30 mg daily and Valium 2 mg as needed, she does feel well controlled.  Denies any depressive or anxiety symptoms  Osteoarthritis of bilateral knees-uses tramadol 50 mg as needed and naproxen as needed.  She does not ambulate much due to chronic pain.  Uses a motorized wheelchair to get around.  Hyperlipidemia -tolerating Crestor 5 mg every other day.  Denies  myalgia or fatigue with this regimen Lab Results  Component Value Date   CHOL 134 08/21/2021   HDL 41 08/21/2021   LDLCALC 76 08/21/2021   LDLDIRECT 150.5 06/20/2008   TRIG 88 08/21/2021   CHOLHDL 4 04/17/2021   Psoriasis -uses steroid creams.  Managed by dermatology   All immunizations and health maintenance protocols were reviewed with the patient and needed orders were placed.  Appropriate screening laboratory values were ordered for the patient including screening of hyperlipidemia, renal function and hepatic function. Medication reconciliation,  past medical history, social history, problem list and allergies were reviewed in detail with the patient  Goals were established with regard to weight loss, exercise, and  diet in compliance with medications   Review of Systems  Constitutional: Negative.   HENT: Negative.    Eyes: Negative.   Respiratory: Negative.    Cardiovascular: Negative.   Gastrointestinal: Negative.   Endocrine: Negative.   Genitourinary: Negative.   Musculoskeletal:  Positive for arthralgias and gait problem.  Skin: Negative.   Allergic/Immunologic: Negative.   Hematological: Negative.   Psychiatric/Behavioral: Negative.     Past Medical History:  Diagnosis Date   Colon polyp 08/16/2004   Hyperplastic   COPD (chronic obstructive pulmonary disease) (HCC)    Depression    GERD (gastroesophageal reflux disease)    High cholesterol    Hypertension    Joint pain    Nephrolithiasis    Obesity    SOB (shortness of breath)    Swelling of lower extremity  Social History   Socioeconomic History   Marital status: Married    Spouse name: FW   Number of children: Not on file   Years of education: Not on file   Highest education level: Bachelor's degree (e.g., BA, AB, BS)  Occupational History   Occupation: Retired  Tobacco Use   Smoking status: Former    Current packs/day: 0.00    Average packs/day: 2.0 packs/day for 40.0 years (80.0 ttl  pk-yrs)    Types: Cigarettes    Start date: 03/24/1966    Quit date: 03/24/2006    Years since quitting: 16.6   Smokeless tobacco: Never  Vaping Use   Vaping status: Never Used  Substance and Sexual Activity   Alcohol use: No    Alcohol/week: 0.0 standard drinks of alcohol   Drug use: No   Sexual activity: Not on file  Other Topics Concern   Not on file  Social History Narrative   Not on file   Social Determinants of Health   Financial Resource Strain: Low Risk  (11/12/2022)   Overall Financial Resource Strain (CARDIA)    Difficulty of Paying Living Expenses: Not hard at all  Food Insecurity: No Food Insecurity (11/12/2022)   Hunger Vital Sign    Worried About Running Out of Food in the Last Year: Never true    Ran Out of Food in the Last Year: Never true  Transportation Needs: No Transportation Needs (11/12/2022)   PRAPARE - Administrator, Civil Service (Medical): No    Lack of Transportation (Non-Medical): No  Physical Activity: Inactive (11/12/2022)   Exercise Vital Sign    Days of Exercise per Week: 0 days    Minutes of Exercise per Session: 0 min  Stress: No Stress Concern Present (11/12/2022)   [employer] of Occupational Health - Occupational Stress Questionnaire    Feeling of Stress : Only a little  Social Connections: Moderately Isolated (11/12/2022)   Social Connection and Isolation Panel [NHANES]    Frequency of Communication with Friends and Family: Once a week    Frequency of Social Gatherings with Friends and Family: Once a week    Attends Religious Services: More than 4 times per year    Active Member of [employer] or Organizations: No    Attends Banker Meetings: Never    Marital Status: Married  Catering manager Violence: Not At Risk (11/07/2020)   Humiliation, Afraid, Rape, and Kick questionnaire    Fear of Current or Ex-Partner: No    Emotionally Abused: No    Physically Abused: No    Sexually Abused: No    Past Surgical History:   Procedure Laterality Date   BREAST BIOPSY Left 12/2017   CHOLECYSTECTOMY     COLONOSCOPY  2006   DILATION AND CURETTAGE OF UTERUS     MOHS SURGERY     TONSILLECTOMY      Family History  Problem Relation Age of Onset   Cancer Mother        unknown primary   Liver cancer Mother        unknown primary   Other Father        spinal cord tumor   Cancer Father    Alcoholism Father    Breast cancer Neg Hx     Allergies  Allergen Reactions   Codeine Phosphate    Fosamax [Alendronate] Other (See Comments)    Muscle aches   Simvastatin     Myalgia     Current  Outpatient Medications on File Prior to Visit  Medication Sig Dispense Refill   albuterol (VENTOLIN HFA) 108 (90 Base) MCG/ACT inhaler USE 2 INHALATIONS BY MOUTH  EVERY 6 HOURS AS NEEDED FOR WHEEZING OR SHORTNESS OF  BREATH 34 g 3   amLODipine (NORVASC) 5 MG tablet TAKE 1 TABLET BY MOUTH DAILY 100 tablet 2   diazepam (VALIUM) 2 MG tablet Take 1 tablet (2 mg total) by mouth at bedtime as needed for anxiety or muscle spasms. 30 tablet 0   losartan-hydrochlorothiazide (HYZAAR) 100-25 MG tablet TAKE 1 TABLET BY MOUTH  DAILY 90 tablet 3   meclizine (ANTIVERT) 25 MG tablet Take 1 tablet (25 mg total) by mouth daily as needed. 90 tablet 1   omeprazole (PRILOSEC) 20 MG capsule TAKE 1 CAPSULE BY MOUTH  DAILY 90 capsule 3   OZEMPIC, 0.25 OR 0.5 MG/DOSE, 2 MG/3ML SOPN INJECT 0.25 MG INTO THE SKIN ONCE A WEEK 3 mL 0   PARoxetine (PAXIL) 30 MG tablet TAKE 2 TABLETS BY MOUTH IN  THE MORNING (Patient taking differently: Take 30 mg by mouth daily.) 180 tablet 3   rosuvastatin (CRESTOR) 5 MG tablet TAKE 1 TABLET BY MOUTH  EVERY OTHER DAY 45 tablet 3   Semaglutide,0.25 or 0.5MG /DOS, 2 MG/3ML SOPN Inject 0.5 mg into the skin once a week. 3 mL 2   SYMBICORT 160-4.5 MCG/ACT inhaler USE 2 INHALATIONS BY MOUTH TWICE DAILY NEED OFFICE VISIT FOR ANY  ADDITIONAL REFILLS 30.6 g 3   traMADol (ULTRAM) 50 MG tablet TAKE 1 TABLET BY MOUTH  EVERY 6 HOURS AS  NEEDED FOR MODERATE PAIN OR SEVERE  PAIN 30 tablet 2   traMADol (ULTRAM) 50 MG tablet Take 1 tablet (50 mg total) by mouth every 8 (eight) hours as needed for moderate pain. 60 tablet 0   triamcinolone (KENALOG) 0.025 % cream Apply 1 application topically 2 (two) times daily.     triamcinolone (NASACORT) 55 MCG/ACT AERO nasal inhaler Place 2 sprays into the nose daily. (Patient taking differently: Place 2 sprays into the nose as needed.) 32.4 mL 3   Vitamin D, Ergocalciferol, (DRISDOL) 1.25 MG (50000 UNIT) CAPS capsule Take 1 capsule (50,000 Units total) by mouth every 7 (seven) days. 4 capsule 0   No current facility-administered medications on file prior to visit.    BP 120/60   Pulse 67   Temp 98.5 F (36.9 C) (Oral)   Wt 222 lb (100.7 kg)   SpO2 97%   BMI 39.33 kg/m       Objective:   Physical Exam Vitals and nursing note reviewed.  Constitutional:      General: She is not in acute distress.    Appearance: Normal appearance. She is obese. She is not ill-appearing.  HENT:     Head: Normocephalic and atraumatic.     Right Ear: Tympanic membrane, ear canal and external ear normal. There is no impacted cerumen.     Left Ear: Tympanic membrane, ear canal and external ear normal. There is no impacted cerumen.     Nose: Nose normal. No congestion or rhinorrhea.     Mouth/Throat:     Mouth: Mucous membranes are moist.     Pharynx: Oropharynx is clear.  Eyes:     Extraocular Movements: Extraocular movements intact.     Conjunctiva/sclera: Conjunctivae normal.     Pupils: Pupils are equal, round, and reactive to light.  Neck:     Vascular: No carotid bruit.  Cardiovascular:     Rate  and Rhythm: Normal rate and regular rhythm.     Pulses: Normal pulses.     Heart sounds: No murmur heard.    No friction rub. No gallop.  Pulmonary:     Effort: Pulmonary effort is normal.     Breath sounds: Normal breath sounds.  Abdominal:     General: Abdomen is flat. Bowel sounds are normal.  There is no distension.     Palpations: Abdomen is soft. There is no mass.     Tenderness: There is no abdominal tenderness. There is no guarding or rebound.     Hernia: No hernia is present.  Musculoskeletal:        General: Normal range of motion.     Cervical back: Normal range of motion and neck supple.  Lymphadenopathy:     Cervical: No cervical adenopathy.  Skin:    General: Skin is warm and dry.     Capillary Refill: Capillary refill takes less than 2 seconds.  Neurological:     General: No focal deficit present.     Mental Status: She is alert and oriented to person, place, and time.     Gait: Gait abnormal.  Psychiatric:        Mood and Affect: Mood normal.        Behavior: Behavior normal.        Thought Content: Thought content normal.        Judgment: Judgment normal.       Assessment & Plan:   1. Routine general medical examination at a health care facility Today patient counseled on age appropriate routine health concerns for screening and prevention, each reviewed and up to date or declined. Immunizations reviewed and up to date or declined. Labs ordered and reviewed. Risk factors for depression reviewed and negative. Hearing function and visual acuity are intact. ADLs screened and addressed as needed. Functional ability and level of safety reviewed and appropriate. Education, counseling and referrals performed based on assessed risks today. Patient provided with a copy of personalized plan for preventive services. - Follow up in one year for CPE - Continue to eat healthy and exercise   2. Impaired glucose tolerance - Continue with Ozempic  - CBC with Differential/Platelet; Future - Comprehensive metabolic panel; Future - Lipid panel; Future - TSH; Future - Hemoglobin A1c; Future - Hemoglobin A1c - TSH - Lipid panel - Comprehensive metabolic panel - CBC with Differential/Platelet  3. Age-related osteoporosis without current pathological fracture - Continue  with calcium and vitamin D - CBC with Differential/Platelet; Future - Comprehensive metabolic panel; Future - Lipid panel; Future - TSH; Future - TSH - Lipid panel - Comprehensive metabolic panel - CBC with Differential/Platelet  4. Essential hypertension - Well controlled. No change in medication  - CBC with Differential/Platelet; Future - Comprehensive metabolic panel; Future - Lipid panel; Future - TSH; Future - TSH - Lipid panel - Comprehensive metabolic panel - CBC with Differential/Platelet  5. Chronic obstructive pulmonary disease, unspecified COPD type (HCC) - Per pulmonary  - CBC with Differential/Platelet; Future - Comprehensive metabolic panel; Future - Lipid panel; Future - TSH; Future - TSH - Lipid panel - Comprehensive metabolic panel - CBC with Differential/Platelet  6. Anxiety and depression - Continue with Paxil 30 mg  - CBC with Differential/Platelet; Future - Comprehensive metabolic panel; Future - Lipid panel; Future - TSH; Future - TSH - Lipid panel - Comprehensive metabolic panel - CBC with Differential/Platelet  7. Primary osteoarthritis of both knees - Continue with Tramadol  as needed - CBC with Differential/Platelet; Future - Comprehensive metabolic panel; Future - Lipid panel; Future - TSH; Future - TSH - Lipid panel - Comprehensive metabolic panel - CBC with Differential/Platelet  8. Hyperlipidemia, unspecified hyperlipidemia type - Consider increase in statin  - CBC with Differential/Platelet; Future - Comprehensive metabolic panel; Future - Lipid panel; Future - TSH; Future - TSH - Lipid panel - Comprehensive metabolic panel - CBC with Differential/Platelet  9. Class 3 severe obesity with serious comorbidity and body mass index (BMI) of 45.0 to 49.9 in adult, unspecified obesity type (HCC)  - CBC with Differential/Platelet; Future - Comprehensive metabolic panel; Future - Lipid panel; Future - TSH; Future - TSH -  Lipid panel - Comprehensive metabolic panel - CBC with Differential/Platelet  10. Psoriasis - Continue    Shirline Frees, NP

## 2022-11-14 NOTE — Patient Instructions (Signed)
It was great seeing you today   We will follow up with you regarding your lab work   Please let me know if you need anything   

## 2022-11-25 ENCOUNTER — Encounter (INDEPENDENT_AMBULATORY_CARE_PROVIDER_SITE_OTHER): Payer: Self-pay | Admitting: Adult Health

## 2022-11-25 ENCOUNTER — Ambulatory Visit (INDEPENDENT_AMBULATORY_CARE_PROVIDER_SITE_OTHER): Payer: Medicare Other | Admitting: Family Medicine

## 2022-11-25 ENCOUNTER — Encounter: Payer: Self-pay | Admitting: Family Medicine

## 2022-11-25 ENCOUNTER — Ambulatory Visit (INDEPENDENT_AMBULATORY_CARE_PROVIDER_SITE_OTHER): Payer: Medicare Other | Admitting: Adult Health

## 2022-11-25 ENCOUNTER — Encounter (INDEPENDENT_AMBULATORY_CARE_PROVIDER_SITE_OTHER): Payer: Self-pay

## 2022-11-25 VITALS — BP 132/72

## 2022-11-25 DIAGNOSIS — E559 Vitamin D deficiency, unspecified: Secondary | ICD-10-CM

## 2022-11-25 DIAGNOSIS — Z0000 Encounter for general adult medical examination without abnormal findings: Secondary | ICD-10-CM | POA: Diagnosis not present

## 2022-11-25 MED ORDER — VITAMIN D (ERGOCALCIFEROL) 1.25 MG (50000 UNIT) PO CAPS: 50000.0000 [IU] | ORAL_CAPSULE | ORAL | 0 refills | Status: DC

## 2022-11-25 NOTE — Progress Notes (Signed)
PATIENT CHECK-IN and HEALTH RISK ASSESSMENT QUESTIONNAIRE:  -completed by phone/video for upcoming Medicare Preventive Visit  Pre-Visit Check-in: 1)Vitals (height, wt, BP, etc) - record in vitals section for visit on day of visit Request home vitals (wt, BP, etc.) and enter into vitals, THEN update Vital Signs SmartPhrase below at the top of the HPI. See below.  2)Review and Update Medications, Allergies PMH, Surgeries, Social history in Epic 3)Hospitalizations in the last year with date/reason?  No   4)Review and Update Care Team (patient's specialists) in Epic 5) Complete PHQ9 in Epic  6) Complete Fall Screening in Epic 7)Review all Health Maintenance Due and order under PCP if not done.  8)Medicare Wellness Questionnaire: Answer theses question about your habits: Do you drink alcohol? No  If yes, how many drinks do you have a day?n/a  Have you ever smoked? Yes  Quit date if applicable? 1994  How many packs a day do/did you smoke? 2 packs/day  Do you use smokeless tobacco? No  Do you use an illicit drugs? No  Do you exercises? No - had several injuries recently, now improving Are you sexually active?  No Number of partners? Typical breakfast: toast and fruit or cereal or bacon and eggs  Typical lunch  ham or [place] with wrap sometime with the sort tortilla, cottage cheese or tuna  Typical dinner  hamburger with veg and starch or another meat  Typical snacks:cheese ball. Cottage cheese and tuna or nuts   Beverages:   decafe iced tea mixed with half and half   Answer theses question about you: Can you perform most household chores? No  Do you find it hard to follow a conversation in a noisy room? Not sure  Do you often ask people to speak up or repeat themselves? yes  Do you feel that you have a problem with memory? No  Do you balance your checkbook and or bank acounts?yes  Do you feel safe at home? Yes Last dentist visit? earlier this year.  Do you need assistance with any of  the following: Please note if so   Driving?  Feeding yourself?  Getting from bed to chair?  Getting to the toilet?  Bathing or showering? Has shower chair  Dressing yourself?  Managing money?  Climbing a flight of stairs  Preparing meals? Husband does   Do you have Advanced Directives in place (Living Will, Healthcare Power or [place])? Yes    Last eye Exam and location? Cataracts done in march   Do you currently use prescribed or non-prescribed narcotic or opioid pain medications? As needed -tramadol  Do you have a history or close family history of breast, ovarian, tubal or peritoneal cancer or a family member with BRCA (breast cancer susceptibility 1 and 2) gene mutations?  No    Unable to get vitals due to time, Request home vitals (wt, BP, etc.) and enter into vitals, THEN update Vital Signs SmartPhrase below at the top of the HPI. See below.   Nurse/Assistant Credentials/time stamp:T Wade CMA 5:15 pm   ----------------------------------------------------------------------------------------------------------------------------------------------------------------------------------------------------------------------  Vital Signs: Vital signs are patient reported.   MEDICARE ANNUAL PREVENTIVE VISIT WITH PROVIDER: (Welcome to Medicare, initial annual wellness or annual wellness exam)  Virtual Visit via Phone Note  I connected with Blakeli Sampley on 11/25/22 by a video enabled telemedicine application and verified that I am speaking with the correct person using two identifiers.  Location patient: home Location provider:work or home office Persons participating in the virtual visit: patient, provider  Concerns and/or follow up today: no concerns today   See HM section in Epic for other details of completed HM.    ROS: negative for report of fevers, unintentional weight loss, vision changes, vision loss, hearing loss or change, chest pain, sob, hemoptysis, melena,  hematochezia, hematuria, falls, bleeding or bruising, thoughts of suicide or self harm, memory loss  Patient-completed extensive health risk assessment - reviewed and discussed with the patient: See Health Risk Assessment completed with patient prior to the visit either above or in recent phone note. This was reviewed in detailed with the patient today and appropriate recommendations, orders and referrals were placed as needed per Summary below and patient instructions.   Review of Medical History: -PMH, PSH, Family History and current specialty and care providers reviewed and updated and listed below   Patient Care Team: Shirline Frees, NP as PCP - General (Family Medicine) Jake Bathe, MD as PCP - Cardiology (Cardiology) Mathews Robinsons, MD as Consulting Physician (Dermatology) Juanell Fairly, RN as Case Manager [place], Milas Kocher, [facility] (Inactive) (Pharmacist)   Past Medical History:  Diagnosis Date   Colon polyp 08/16/2004   Hyperplastic   COPD (chronic obstructive pulmonary disease) (HCC)    Depression    GERD (gastroesophageal reflux disease)    High cholesterol    Hypertension    Joint pain    Nephrolithiasis    Obesity    SOB (shortness of breath)    Swelling of lower extremity     Past Surgical History:  Procedure Laterality Date   BREAST BIOPSY Left 12/2017   CHOLECYSTECTOMY     COLONOSCOPY  2006   DILATION AND CURETTAGE OF UTERUS     MOHS SURGERY     TONSILLECTOMY      Social History   Socioeconomic History   Marital status: Married    Spouse name: FW   Number of children: Not on file   Years of education: Not on file   Highest education level: Bachelor's degree (e.g., BA, AB, BS)  Occupational History   Occupation: Retired  Tobacco Use   Smoking status: Former    Current packs/day: 0.00    Average packs/day: 2.0 packs/day for 40.0 years (80.0 ttl pk-yrs)    Types: Cigarettes    Start date: 03/24/1966    Quit date: 03/24/2006    Years since  quitting: 16.6   Smokeless tobacco: Never  Vaping Use   Vaping status: Never Used  Substance and Sexual Activity   Alcohol use: No    Alcohol/week: 0.0 standard drinks of alcohol   Drug use: No   Sexual activity: Not on file  Other Topics Concern   Not on file  Social History Narrative   Not on file   Social Determinants of Health   Financial Resource Strain: Low Risk  (11/12/2022)   Overall Financial Resource Strain (CARDIA)    Difficulty of Paying Living Expenses: Not hard at all  Food Insecurity: No Food Insecurity (11/12/2022)   Hunger Vital Sign    Worried About Running Out of Food in the Last Year: Never true    Ran Out of Food in the Last Year: Never true  Transportation Needs: No Transportation Needs (11/12/2022)   PRAPARE - Administrator, Civil Service (Medical): No    Lack of Transportation (Non-Medical): No  Physical Activity: Unknown (11/12/2022)   Exercise Vital Sign    Days of Exercise per Week: 0 days    Minutes of Exercise per Session: Not on  file  Recent Concern: Physical Activity - Inactive (11/12/2022)   Exercise Vital Sign    Days of Exercise per Week: 0 days    Minutes of Exercise per Session: 0 min  Stress: No Stress Concern Present (11/12/2022)   [employer] of Occupational Health - Occupational Stress Questionnaire    Feeling of Stress : Only a little  Social Connections: Moderately Isolated (11/12/2022)   Social Connection and Isolation Panel [NHANES]    Frequency of Communication with Friends and Family: Once a week    Frequency of Social Gatherings with Friends and Family: Once a week    Attends Religious Services: More than 4 times per year    Active Member of [employer] or Organizations: No    Attends Banker Meetings: Never    Marital Status: Married  Catering manager Violence: Not At Risk (11/07/2020)   Humiliation, Afraid, Rape, and Kick questionnaire    Fear of Current or Ex-Partner: No    Emotionally Abused: No     Physically Abused: No    Sexually Abused: No    Family History  Problem Relation Age of Onset   Cancer Mother        unknown primary   Liver cancer Mother        unknown primary   Other Father        spinal cord tumor   Cancer Father    Alcoholism Father    Breast cancer Neg Hx     Current Outpatient Medications on File Prior to Visit  Medication Sig Dispense Refill   albuterol (VENTOLIN HFA) 108 (90 Base) MCG/ACT inhaler USE 2 INHALATIONS BY MOUTH  EVERY 6 HOURS AS NEEDED FOR WHEEZING OR SHORTNESS OF  BREATH 34 g 3   amLODipine (NORVASC) 5 MG tablet TAKE 1 TABLET BY MOUTH DAILY 100 tablet 2   diazepam (VALIUM) 2 MG tablet Take 1 tablet (2 mg total) by mouth at bedtime as needed for anxiety or muscle spasms. 30 tablet 0   losartan-hydrochlorothiazide (HYZAAR) 100-25 MG tablet TAKE 1 TABLET BY MOUTH  DAILY 90 tablet 3   meclizine (ANTIVERT) 25 MG tablet Take 1 tablet (25 mg total) by mouth daily as needed. 90 tablet 1   omeprazole (PRILOSEC) 20 MG capsule TAKE 1 CAPSULE BY MOUTH  DAILY 90 capsule 3   OZEMPIC, 0.25 OR 0.5 MG/DOSE, 2 MG/3ML SOPN INJECT 0.25 MG INTO THE SKIN ONCE A WEEK 3 mL 0   PARoxetine (PAXIL) 30 MG tablet TAKE 2 TABLETS BY MOUTH IN  THE MORNING (Patient taking differently: Take 30 mg by mouth daily.) 180 tablet 3   rosuvastatin (CRESTOR) 5 MG tablet TAKE 1 TABLET BY MOUTH  EVERY OTHER DAY 45 tablet 3   Semaglutide,0.25 or 0.5MG /DOS, 2 MG/3ML SOPN Inject 0.5 mg into the skin once a week. 3 mL 2   SYMBICORT 160-4.5 MCG/ACT inhaler USE 2 INHALATIONS BY MOUTH TWICE DAILY NEED OFFICE VISIT FOR ANY  ADDITIONAL REFILLS 30.6 g 3   traMADol (ULTRAM) 50 MG tablet Take 1 tablet (50 mg total) by mouth every 8 (eight) hours as needed for moderate pain. 60 tablet 0   triamcinolone (KENALOG) 0.025 % cream Apply 1 application topically 2 (two) times daily.     triamcinolone (NASACORT) 55 MCG/ACT AERO nasal inhaler Place 2 sprays into the nose daily. (Patient taking differently:  Place 2 sprays into the nose as needed.) 32.4 mL 3   Vitamin D, Ergocalciferol, (DRISDOL) 1.25 MG (50000 UNIT) CAPS capsule Take 1  capsule (50,000 Units total) by mouth every 7 (seven) days. 4 capsule 0   No current facility-administered medications on file prior to visit.    Allergies  Allergen Reactions   Codeine Phosphate    Fosamax [Alendronate] Other (See Comments)    Muscle aches   Simvastatin     Myalgia        Physical Exam Vitals requested from patient and listed below if patient had equipment and was able to obtain at home for this virtual visit: Vitals:   11/25/22 1749  BP: 132/72   Estimated body mass index is 39.33 kg/m as calculated from the following:   Height as of an earlier encounter on 11/25/22: 5\' 3"  (1.6 m).   Weight as of 11/14/22: 222 lb (100.7 kg).  EKG (optional): deferred due to virtual visit  GENERAL: alert, oriented, no acute distress detected, full vision exam deferred due to pandemic and/or virtual encounter  PSYCH/NEURO: pleasant and cooperative, no obvious depression or anxiety, speech and thought processing grossly intact, Cognitive function grossly intact  Flowsheet Row Office Visit from 10/01/2022 in [facility] [place]  PHQ-9 Total Score 0           10/01/2022    3:26 PM 08/13/2022    9:22 AM 02/28/2022   11:48 AM 11/19/2021    8:33 AM 11/12/2021    8:01 AM  Depression screen PHQ 2/9  Decreased Interest 0 1 0 0 1  Down, Depressed, Hopeless 0 1 0 0 1  PHQ - 2 Score 0 2 0 0 2  Altered sleeping 0 1   1  Tired, decreased energy 0 1   1  Change in appetite 0 1   1  Feeling bad or failure about yourself  0 1   0  Trouble concentrating 0 0   0  Moving slowly or fidgety/restless 0 0   0  Suicidal thoughts 0 0   0  PHQ-9 Score 0 6   5  Difficult doing work/chores Not difficult at all Somewhat difficult   Not difficult at all       02/28/2022   11:48 AM 04/17/2022   11:50 AM 08/13/2022    9:22 AM 10/01/2022     3:25 PM 11/12/2022   11:36 AM  Fall Risk  Falls in the past year? 0 0 0 0 0  Was there an injury with Fall?  0 0 0   Fall Risk Category Calculator  0 0 0   (RETIRED) Patient Fall Risk Level Low fall risk      Patient at Risk for Falls Due to Medication side effect;Other (Comment) Medication side effect;Other (Comment) No Fall Risks No Fall Risks   Patient at Risk for Falls Due to - Comments Activity tolerance d/t COPD Decreased activity tolerance d/t COPD     Fall risk Follow up Falls prevention discussed Falls prevention discussed Falls evaluation completed Falls evaluation completed      SUMMARY AND PLAN:  Encounter for Medicare annual wellness exam   Discussed applicable health maintenance/preventive health measures and advised and referred or ordered per patient preferences: -discussed vaccines due, recommendations and where to get -she denies the dx of diabetes  Health Maintenance  Topic Date Due   OPHTHALMOLOGY EXAM  Never done   INFLUENZA VACCINE  10/23/2022   COVID-19 Vaccine (5 - 2023-24 season) 11/23/2022   Diabetic kidney evaluation - Urine ACR  08/13/2027 (Originally 05/31/1968)   HEMOGLOBIN A1C  05/17/2023   MAMMOGRAM  07/26/2023   Diabetic kidney evaluation - eGFR measurement  11/14/2023   Medicare Annual Wellness (AWV)  11/25/2023   Fecal DNA (Cologuard)  11/20/2024   DTaP/Tdap/Td (3 - Td or Tdap) 11/13/2031   Pneumonia Vaccine 73+ Years old  Completed   DEXA SCAN  Completed   Hepatitis C Screening  Completed   Zoster Vaccines- Shingrix  Completed   HPV VACCINES  Aged Out   FOOT EXAM  Discontinued     Education and counseling on the following was provided based on the above review of health and a plan/checklist for the patient, along with additional information discussed, was provided for the patient in the patient instructions :  -discussed BP goals and advised to monitor at home -Provided safe balance exercises that can be done at home to improve balance and  discussed exercise guidelines for adults  - see patient instructions -Advised and counseled on a healthy lifestyle - she is currently enrolled in wellness program at [facility] -Reviewed patient's current diet. Advised and counseled on a whole foods based healthy diet. A summary of a healthy diet was provided in the Patient Instructions.  -reviewed patient's current physical activity level and discussed exercise guidelines for adults. Discussed community resources and ideas for safe exercise at home to assist in meeting exercise guideline recommendations in a safe and healthy way.  -Advise yearly dental visits at minimum and regular eye exams   Follow up: see patient instructions     Patient Instructions  I really enjoyed getting to talk with you today! I am available on Tuesdays and Thursdays for virtual visits if you have any questions or concerns, or if I can be of any further assistance.   CHECKLIST FROM ANNUAL WELLNESS VISIT:  -Follow up (please call to schedule if not scheduled after visit):   -yearly for annual wellness visit with primary care office  Here is a list of your preventive care/health maintenance measures and the plan for each if any are due:  PLAN For any measures below that may be due:   Health Maintenance  Topic Date Due   OPHTHALMOLOGY EXAM  Never done   INFLUENZA VACCINE  10/23/2022   COVID-19 Vaccine (5 - 2023-24 season) 11/23/2022   Diabetic kidney evaluation - Urine ACR  08/13/2027 (Originally 05/31/1968)   HEMOGLOBIN A1C  05/17/2023   MAMMOGRAM  07/26/2023   Diabetic kidney evaluation - eGFR measurement  11/14/2023   Medicare Annual Wellness (AWV)  11/25/2023   Fecal DNA (Cologuard)  11/20/2024   DTaP/Tdap/Td (3 - Td or Tdap) 11/13/2031   Pneumonia Vaccine 66+ Years old  Completed   DEXA SCAN  Completed   Hepatitis C Screening  Completed   Zoster Vaccines- Shingrix  Completed   HPV VACCINES  Aged Out   FOOT EXAM  Discontinued    -See a dentist at  least yearly  -Get your eyes checked and then per your eye specialist's recommendations  -Other issues addressed today:   -I have included below further information regarding a healthy whole foods based diet, physical activity guidelines for adults, stress management and opportunities for social connections. I hope you find this information useful.   -----------------------------------------------------------------------------------------------------------------------------------------------------------------------------------------------------------------------------------------------------------  NUTRITION: -eat real food: lots of colorful vegetables (half the plate) and fruits -5-7 servings of vegetables and fruits per day (fresh or steamed is best), exp. 2 servings of vegetables with lunch and dinner and 2 servings of fruit per day. Berries and greens such as kale and collards are great choices.  -consume  on a regular basis: whole grains (make sure first ingredient on label contains the word "whole"), fresh fruits, fish, nuts, seeds, healthy oils (such as olive oil, avocado oil, grape seed oil) -may eat small amounts of dairy and lean meat on occasion, but avoid processed meats such as ham, bacon, lunch meat, etc. -drink water -try to avoid fast food and pre-packaged foods, processed meat -most experts advise limiting sodium to < 2300mg  per day, should limit further is any chronic conditions such as high blood pressure, heart disease, diabetes, etc. The American Heart Association advised that < 1500mg  is is ideal -try to avoid foods that contain any ingredients with names you do not recognize  -try to avoid sugar/sweets (except for the natural sugar that occurs in fresh fruit) -try to avoid sweet drinks -try to avoid white rice, white bread, pasta (unless whole grain), white or yellow potatoes  EXERCISE GUIDELINES FOR ADULTS: -if you wish to increase your physical activity, do so  gradually and with the approval of your doctor -STOP and seek medical care immediately if you have any chest pain, chest discomfort or trouble breathing when starting or increasing exercise  -move and stretch your body, legs, feet and arms when sitting for long periods -Physical activity guidelines for optimal health in adults: -least 150 minutes per week of aerobic exercise (can talk, but not sing) once approved by your doctor, 20-30 minutes of sustained activity or two 10 minute episodes of sustained activity every day.  -resistance training at least 2 days per week if approved by your doctor -balance exercises 3+ days per week:   Stand somewhere where you have something sturdy to hold onto if you lose balance.    1) lift up on toes, start with 5x per day and work up to 20x   2) stand and lift on leg straight out to the side so that foot is a few inches of the floor, start with 5x each side and work up to 20x each side   3) stand on one foot, start with 5 seconds each side and work up to 20 seconds on each side  If you need ideas or help with getting more active:  -Silver sneakers https://tools.silversneakers.com  -Walk with a Doc: http://www.duncan-williams.com/  -try to include resistance (weight lifting/strength building) and balance exercises twice per week: or the following link for ideas: http://castillo-powell.com/  BuyDucts.dk  STRESS MANAGEMENT: -can try meditating, or just sitting quietly with deep breathing while intentionally relaxing all parts of your body for 5 minutes daily -if you need further help with stress, anxiety or depression please follow up with your primary doctor or contact the wonderful folks at [facility]: [phone]  SOCIAL CONNECTIONS: -options in [place] if you wish to engage in more social and exercise related activities:  -Silver  sneakers https://tools.silversneakers.com  -Walk with a Doc: http://www.duncan-williams.com/  -Check out the [facility] Active Adults 50+ section on the [place] [employer] (hiking clubs, book clubs, cards and games, chess, exercise classes, aquatic classes and much more) - see the website for details: https://www.[place].gov/departments/parks-recreation/active-adults50  -YouTube has lots of exercise videos for different ages and abilities as well  -[facility] (a variety of indoor and outdoor inperson activities for adults). [phone]) [phone]. [address].  -Virtual Online Classes (a variety of topics): see seniorplanet.org or call [phone]  -consider volunteering at a school, hospice center, church, senior center or elsewhere           Terressa Koyanagi, DO

## 2022-11-25 NOTE — Patient Instructions (Signed)
I really enjoyed getting to talk with you today! I am available on Tuesdays and Thursdays for virtual visits if you have any questions or concerns, or if I can be of any further assistance.   CHECKLIST FROM ANNUAL WELLNESS VISIT:  -Follow up (please call to schedule if not scheduled after visit):   -yearly for annual wellness visit with primary care office  Here is a list of your preventive care/health maintenance measures and the plan for each if any are due:  PLAN For any measures below that may be due:   Health Maintenance  Topic Date Due   OPHTHALMOLOGY EXAM  Never done   INFLUENZA VACCINE  10/23/2022   COVID-19 Vaccine (5 - 2023-24 season) 11/23/2022   Diabetic kidney evaluation - Urine ACR  08/13/2027 (Originally 05/31/1968)   HEMOGLOBIN A1C  05/17/2023   MAMMOGRAM  07/26/2023   Diabetic kidney evaluation - eGFR measurement  11/14/2023   Medicare Annual Wellness (AWV)  11/25/2023   Fecal DNA (Cologuard)  11/20/2024   DTaP/Tdap/Td (3 - Td or Tdap) 11/13/2031   Pneumonia Vaccine 40+ Years old  Completed   DEXA SCAN  Completed   Hepatitis C Screening  Completed   Zoster Vaccines- Shingrix  Completed   HPV VACCINES  Aged Out   FOOT EXAM  Discontinued    -See a dentist at least yearly  -Get your eyes checked and then per your eye specialist's recommendations  -Other issues addressed today:   -I have included below further information regarding a healthy whole foods based diet, physical activity guidelines for adults, stress management and opportunities for social connections. I hope you find this information useful.   -----------------------------------------------------------------------------------------------------------------------------------------------------------------------------------------------------------------------------------------------------------  NUTRITION: -eat real food: lots of colorful vegetables (half the plate) and fruits -5-7 servings of  vegetables and fruits per day (fresh or steamed is best), exp. 2 servings of vegetables with lunch and dinner and 2 servings of fruit per day. Berries and greens such as kale and collards are great choices.  -consume on a regular basis: whole grains (make sure first ingredient on label contains the word "whole"), fresh fruits, fish, nuts, seeds, healthy oils (such as olive oil, avocado oil, grape seed oil) -may eat small amounts of dairy and lean meat on occasion, but avoid processed meats such as ham, bacon, lunch meat, etc. -drink water -try to avoid fast food and pre-packaged foods, processed meat -most experts advise limiting sodium to < 2300mg  per day, should limit further is any chronic conditions such as high blood pressure, heart disease, diabetes, etc. The American Heart Association advised that < 1500mg  is is ideal -try to avoid foods that contain any ingredients with names you do not recognize  -try to avoid sugar/sweets (except for the natural sugar that occurs in fresh fruit) -try to avoid sweet drinks -try to avoid white rice, white bread, pasta (unless whole grain), white or yellow potatoes  EXERCISE GUIDELINES FOR ADULTS: -if you wish to increase your physical activity, do so gradually and with the approval of your doctor -STOP and seek medical care immediately if you have any chest pain, chest discomfort or trouble breathing when starting or increasing exercise  -move and stretch your body, legs, feet and arms when sitting for long periods -Physical activity guidelines for optimal health in adults: -least 150 minutes per week of aerobic exercise (can talk, but not sing) once approved by your doctor, 20-30 minutes of sustained activity or two 10 minute episodes of sustained activity every day.  -resistance training  at least 2 days per week if approved by your doctor -balance exercises 3+ days per week:   Stand somewhere where you have something sturdy to hold onto if you lose  balance.    1) lift up on toes, start with 5x per day and work up to 20x   2) stand and lift on leg straight out to the side so that foot is a few inches of the floor, start with 5x each side and work up to 20x each side   3) stand on one foot, start with 5 seconds each side and work up to 20 seconds on each side  If you need ideas or help with getting more active:  -Silver sneakers https://tools.silversneakers.com  -Walk with a Doc: http://www.duncan-williams.com/  -try to include resistance (weight lifting/strength building) and balance exercises twice per week: or the following link for ideas: http://castillo-powell.com/  BuyDucts.dk  STRESS MANAGEMENT: -can try meditating, or just sitting quietly with deep breathing while intentionally relaxing all parts of your body for 5 minutes daily -if you need further help with stress, anxiety or depression please follow up with your primary doctor or contact the wonderful folks at [facility]: [phone]  SOCIAL CONNECTIONS: -options in [place] if you wish to engage in more social and exercise related activities:  -Silver sneakers https://tools.silversneakers.com  -Walk with a Doc: http://www.duncan-williams.com/  -Check out the [facility] Active Adults 50+ section on the [place] of [employer] (hiking clubs, book clubs, cards and games, chess, exercise classes, aquatic classes and much more) - see the website for details: https://www.[place].gov/departments/parks-recreation/active-adults50  -YouTube has lots of exercise videos for different ages and abilities as well  -[facility] (a variety of indoor and outdoor inperson activities for adults). [phone]. [address].  -Virtual Online Classes (a variety of topics): see seniorplanet.org or call (570) [phone]  -consider volunteering at a school, hospice  center, church, senior center or elsewhere

## 2022-11-25 NOTE — Progress Notes (Signed)
Pt not seen.

## 2022-12-19 ENCOUNTER — Telehealth: Payer: Self-pay | Admitting: Adult Health

## 2022-12-19 NOTE — Telephone Encounter (Signed)
Pt has finished her OZEMPIC, 0.25 OR 0.5 MG/DOSE, 2 MG/3ML SOPN asking if patient assistance has kicked in and if not requesting a rx to be sent to  [facility] [address] [place] - [address] Phone: [phone]  Fax: [phone]

## 2022-12-19 NOTE — Telephone Encounter (Signed)
Do you have the status for this?

## 2022-12-22 NOTE — Telephone Encounter (Signed)
Ozempic refill processed with [employer] for patient, will take 10-14 days to arrive at the office.  Patient notified and okay with this time frame.  Sherrill Raring, PharmD Clinical Pharmacist [phone]

## 2022-12-23 ENCOUNTER — Other Ambulatory Visit: Payer: Self-pay | Admitting: Adult Health

## 2022-12-23 NOTE — Telephone Encounter (Signed)
Noted  

## 2022-12-24 ENCOUNTER — Encounter (INDEPENDENT_AMBULATORY_CARE_PROVIDER_SITE_OTHER): Payer: Self-pay | Admitting: Adult Health

## 2022-12-24 ENCOUNTER — Ambulatory Visit (INDEPENDENT_AMBULATORY_CARE_PROVIDER_SITE_OTHER): Payer: Medicare Other | Admitting: Adult Health

## 2022-12-24 VITALS — BP 131/83 | HR 87 | Temp 98.5°F | Ht 63.0 in | Wt 213.0 lb

## 2022-12-24 DIAGNOSIS — I10 Essential (primary) hypertension: Secondary | ICD-10-CM | POA: Diagnosis not present

## 2022-12-24 DIAGNOSIS — J449 Chronic obstructive pulmonary disease, unspecified: Secondary | ICD-10-CM

## 2022-12-24 DIAGNOSIS — E66813 Obesity, class 3: Secondary | ICD-10-CM

## 2022-12-24 DIAGNOSIS — E559 Vitamin D deficiency, unspecified: Secondary | ICD-10-CM | POA: Diagnosis not present

## 2022-12-24 DIAGNOSIS — E669 Obesity, unspecified: Secondary | ICD-10-CM

## 2022-12-24 DIAGNOSIS — R7302 Impaired glucose tolerance (oral): Secondary | ICD-10-CM

## 2022-12-24 DIAGNOSIS — Z6837 Body mass index (BMI) 37.0-37.9, adult: Secondary | ICD-10-CM

## 2022-12-24 MED ORDER — VITAMIN D (ERGOCALCIFEROL) 1.25 MG (50000 UNIT) PO CAPS: 50000.0000 [IU] | ORAL_CAPSULE | ORAL | 0 refills | Status: DC

## 2022-12-24 NOTE — Progress Notes (Signed)
WEIGHT SUMMARY AND BIOMETRICS  Vitals Temp: 98.5 F (36.9 C) BP: 131/83 Pulse Rate: 87 SpO2: 95 %   Anthropometric Measurements Height: 5\' 3"  (1.6 m) Weight: 213 lb (96.6 kg) BMI (Calculated): 37.74 Weight at Last Visit: 221lb Weight Lost Since Last Visit: 8lb Weight Gained Since Last Visit: 0 Starting Weight: 269lb Total Weight Loss (lbs): 56 lb (25.4 kg)   Body Composition  Body Fat %: 54.2 % Fat Mass (lbs): 115.6 lbs Muscle Mass (lbs): 92.6 lbs Visceral Fat Rating : 18   Other Clinical Data Fasting: no Labs: no Today's Visit #: 17 Starting Date: 08/21/21    Chief Complaint:   OBESITY Laurie Dominguez is here to discuss her progress with her obesity treatment plan. She is on the the Category 3 Plan and states she is following her eating plan approximately 60 % of the time. She states she is exercising Chair Exercise and Packing Boxes.   Interim History:  11/14/2022 Chronic Medical OV with fasting labs- PCP/Laurie Nafzinger, NP-C Hunger/appetite-she denies polyphagia- currently on weekly Ozempic 1mg   Exercise-chair exercises and house work  Hydration-she estimates to drink 24 oz water 3 x day  Per pt- peak weight 306 lbs Current weight 213 lbs with corresponding 37.8 Interval Goal Loss <200 lbs  Subjective:   1. Impaired glucose tolerance  Latest Reference Range & Units 08/21/21 12:47 01/22/22 13:04 07/09/22 11:54 11/14/22 08:44  Glucose 70 - 99 mg/dL 956 (H) 97 93 213 (H)  (H): Data is abnormally high  PCP/Laurie Nafzinger NP-C- has her on patient assistance program with [employer] She is on weekly Ozempic 1mg  injection Denies mass in neck, dysphagia, dyspepsia, persistent hoarseness, abdominal pain, or N/V/C   She injects on Fridays- has not missed any doses yet. She does not have anymore Ozempic at home- awaiting 6 month shipment from [employer] by way of PCP's office  2. Chronic obstructive pulmonary disease, unspecified COPD type  (HCC) Followed by Pulmonology She uses supplementation O2- 3.5 LNC/24 hrs  She reports improved mobility and stamina with weight loss. Per pt- peak weight 306 lbs Current weight 213 lsb with corresponding 37.8  3. Essential hypertension BP stable at OV She denies CP with exertion She is on  amLODipine (NORVASC) 5 MG tablet  losartan-hydrochlorothiazide (HYZAAR) 100-25 MG tablet    4. Vitamin D deficiency  Latest Reference Range & Units 08/21/21 12:47 01/22/22 13:04 07/09/22 11:54  Vitamin D, 25-Hydroxy 30.0 - 100.0 ng/mL 10.5 (L) 53.8 53.9  (L): Data is abnormally low  She is no weekly Ergocalciferol- denies N/V/Muscle Weakness  Assessment/Plan:   1. Impaired glucose tolerance Continue weekly Ozempic 1mg  injection Call PCP and inquire status of GLP-1 therapy shipment  2. Chronic obstructive pulmonary disease, unspecified COPD type (HCC) Continue with weight loss efforts  3. Essential hypertension Continue amLODipine (NORVASC) 5 MG tablet  losartan-hydrochlorothiazide (HYZAAR) 100-25 MG tablet   4. Vitamin D deficiency Refill - Vitamin D, Ergocalciferol, (DRISDOL) 1.25 MG (50000 UNIT) CAPS capsule; Take 1 capsule (50,000 Units total) by mouth every 7 (seven) days.  Dispense: 4 capsule; Refill: 0  5. Obesity (BMI 30-39.9), Current BMI 37.74  Laurie Dominguez is currently in the action stage of change. As such, her goal is to continue with weight loss efforts. She has agreed to the Category 3 Plan.   Exercise goals: Older adults should follow the adult guidelines. When older adults cannot meet the adult guidelines, they should be as physically active as their abilities and conditions will allow.  Older adults should do exercises that maintain or improve balance if they are at risk of falling.  Older adults should determine their level of effort for physical activity relative to their level of fitness.  Older adults with chronic conditions should understand whether and how their  conditions affect their ability to do regular physical activity safely.  Behavioral modification strategies: increasing lean protein intake, decreasing simple carbohydrates, increasing vegetables, increasing water intake, no skipping meals, meal planning and cooking strategies, better snacking choices, and planning for success.  [name] has agreed to follow-up with our clinic in 4 weeks. She was informed of the importance of frequent follow-up visits to maximize her success with intensive lifestyle modifications for her multiple health conditions.   Objective:   Blood pressure 131/83, pulse 87, temperature 98.5 F (36.9 C), height 5\' 3"  (1.6 m), weight 213 lb (96.6 kg), SpO2 95%. Body mass index is 37.73 kg/m.  General: Cooperative, alert, well developed, in no acute distress. HEENT: Conjunctivae and lids unremarkable. Cardiovascular: Regular rhythm.  Lungs: Normal work of breathing. Neurologic: No focal deficits.   Lab Results  Component Value Date   CREATININE 0.63 11/14/2022   BUN 16 11/14/2022   NA 144 11/14/2022   K 3.6 11/14/2022   CL 100 11/14/2022   CO2 38 (H) 11/14/2022   Lab Results  Component Value Date   ALT 8 11/14/2022   AST 10 11/14/2022   ALKPHOS 75 11/14/2022   BILITOT 0.4 11/14/2022   Lab Results  Component Value Date   HGBA1C 5.3 11/14/2022   HGBA1C 5.4 07/09/2022   HGBA1C 5.4 01/22/2022   HGBA1C 5.6 08/21/2021   HGBA1C 5.8 11/08/2020   Lab Results  Component Value Date   INSULIN 9.2 07/09/2022   INSULIN 11.5 01/22/2022   INSULIN 12.0 08/21/2021   Lab Results  Component Value Date   TSH 3.31 11/14/2022   Lab Results  Component Value Date   CHOL 135 11/14/2022   HDL 36.40 (L) 11/14/2022   LDLCALC 80 11/14/2022   LDLDIRECT 150.5 06/20/2008   TRIG 95.0 11/14/2022   CHOLHDL 4 11/14/2022   Lab Results  Component Value Date   VD25OH 53.9 07/09/2022   VD25OH 53.8 01/22/2022   VD25OH 10.5 (L) 08/21/2021   Lab Results  Component Value  Date   WBC 8.6 11/14/2022   HGB 12.9 11/14/2022   HCT 40.2 11/14/2022   MCV 92.9 11/14/2022   PLT 300.0 11/14/2022   No results found for: "IRON", "TIBC", "FERRITIN"  Attestation Statements:   Reviewed by clinician on day of visit: allergies, medications, problem list, medical history, surgical history, family history, social history, and previous encounter notes.  I have reviewed the above documentation for accuracy and completeness, and I agree with the above. -  Ebonye Reade d. Gilliam Hawkes, NP-C

## 2022-12-27 ENCOUNTER — Other Ambulatory Visit: Payer: Self-pay | Admitting: Adult Health

## 2023-01-04 ENCOUNTER — Other Ambulatory Visit: Payer: Self-pay | Admitting: Adult Health

## 2023-01-07 ENCOUNTER — Telehealth: Payer: Self-pay

## 2023-01-07 NOTE — Patient Outreach (Signed)
Care Management   Outreach Note  01/07/2023 Name: Laurie Dominguez MRN: [id number] DOB: [birth year]  An unsuccessful telephone outreach was attempted today to contact the patient about Care Management needs.     Follow Up Plan:  A HIPAA compliant phone message was left for the patient providing contact information and requesting a return call.    [facility]ina [facility]  [facility], [facility] RN Care Manager Direct Dial: [phone] Website: [email or web address]

## 2023-01-09 ENCOUNTER — Telehealth: Payer: Self-pay

## 2023-01-09 NOTE — Telephone Encounter (Signed)
Pt advised that pt medication assistant Rx is ready for pick up.

## 2023-01-20 ENCOUNTER — Encounter: Payer: Self-pay | Admitting: Adult Health

## 2023-01-20 ENCOUNTER — Ambulatory Visit (INDEPENDENT_AMBULATORY_CARE_PROVIDER_SITE_OTHER): Payer: Medicare Other | Admitting: Adult Health

## 2023-01-20 VITALS — BP 130/80 | HR 83 | Temp 98.3°F | Ht 63.0 in | Wt 217.0 lb

## 2023-01-20 DIAGNOSIS — G8929 Other chronic pain: Secondary | ICD-10-CM

## 2023-01-20 DIAGNOSIS — M25561 Pain in right knee: Secondary | ICD-10-CM

## 2023-01-20 DIAGNOSIS — M25562 Pain in left knee: Secondary | ICD-10-CM | POA: Diagnosis not present

## 2023-01-20 MED ORDER — METHYLPREDNISOLONE ACETATE 80 MG/ML IJ SUSP
80.0000 mg | Freq: Once | INTRAMUSCULAR | Status: AC
Start: 2023-01-20 — End: 2023-01-20
  Administered 2023-01-20: 80 mg via INTRA_ARTICULAR

## 2023-01-20 MED ORDER — METHYLPREDNISOLONE ACETATE 80 MG/ML IJ SUSP
80.0000 mg | Freq: Once | INTRAMUSCULAR | Status: AC
Start: 2023-01-20 — End: 2023-01-20
  Administered 2023-01-20: 80 mg via INTRAMUSCULAR

## 2023-01-20 NOTE — Progress Notes (Signed)
Subjective:    Patient ID: Laurie Dominguez, female    DOB: 10/12/1950, 72 y.o.   MRN: [id number]  HPI 72 year old female who  has a past medical history of Colon polyp (08/16/2004), COPD (chronic obstructive pulmonary disease) (HCC), Depression, GERD (gastroesophageal reflux disease), High cholesterol, Hypertension, Joint pain, Nephrolithiasis, Obesity, SOB (shortness of breath), and Swelling of lower extremity.  She presents to the office today for chronic right and left  knee pain. She has end stage osteoarthritis. She had steroid injection into her right knee before with her last being on 04/2021 and had significant relief from the pain. She would like to have both knees injected today    Review of Systems See HPI   Past Medical History:  Diagnosis Date   Colon polyp 08/16/2004   Hyperplastic   COPD (chronic obstructive pulmonary disease) (HCC)    Depression    GERD (gastroesophageal reflux disease)    High cholesterol    Hypertension    Joint pain    Nephrolithiasis    Obesity    SOB (shortness of breath)    Swelling of lower extremity     Social History   Socioeconomic History   Marital status: Married    Spouse name: FW   Number of children: Not on file   Years of education: Not on file   Highest education level: Bachelor's degree (e.g., BA, AB, BS)  Occupational History   Occupation: Retired  Tobacco Use   Smoking status: Former    Current packs/day: 0.00    Average packs/day: 2.0 packs/day for 40.0 years (80.0 ttl pk-yrs)    Types: Cigarettes    Start date: 03/24/1966    Quit date: 03/24/2006    Years since quitting: 16.8   Smokeless tobacco: Never  Vaping Use   Vaping status: Never Used  Substance and Sexual Activity   Alcohol use: No    Alcohol/week: 0.0 standard drinks of alcohol   Drug use: No   Sexual activity: Not on file  Other Topics Concern   Not on file  Social History Narrative   Not on file   Social Determinants of Health   Financial  Resource Strain: Low Risk  (11/12/2022)   Overall Financial Resource Strain (CARDIA)    Difficulty of Paying Living Expenses: Not hard at all  Food Insecurity: No Food Insecurity (11/12/2022)   Hunger Vital Sign    Worried About Running Out of Food in the Last Year: Never true    Ran Out of Food in the Last Year: Never true  Transportation Needs: No Transportation Needs (11/12/2022)   PRAPARE - Administrator, Civil Service (Medical): No    Lack of Transportation (Non-Medical): No  Physical Activity: Unknown (11/12/2022)   Exercise Vital Sign    Days of Exercise per Week: 0 days    Minutes of Exercise per Session: Not on file  Recent Concern: Physical Activity - Inactive (11/12/2022)   Exercise Vital Sign    Days of Exercise per Week: 0 days    Minutes of Exercise per Session: 0 min  Stress: No Stress Concern Present (11/12/2022)   [employer] of Occupational Health - Occupational Stress Questionnaire    Feeling of Stress : Only a little  Social Connections: Moderately Isolated (11/12/2022)   Social Connection and Isolation Panel [NHANES]    Frequency of Communication with Friends and Family: Once a week    Frequency of Social Gatherings with Friends and Family: Once a  week    Attends Religious Services: More than 4 times per year    Active Member of Clubs or Organizations: No    Attends Banker Meetings: Never    Marital Status: Married  Catering manager Violence: Not At Risk (11/07/2020)   Humiliation, Afraid, Rape, and Kick questionnaire    Fear of Current or Ex-Partner: No    Emotionally Abused: No    Physically Abused: No    Sexually Abused: No    Past Surgical History:  Procedure Laterality Date   BREAST BIOPSY Left 12/2017   CHOLECYSTECTOMY     COLONOSCOPY  2006   DILATION AND CURETTAGE OF UTERUS     MOHS SURGERY     TONSILLECTOMY      Family History  Problem Relation Age of Onset   Cancer Mother        unknown primary   Liver cancer  Mother        unknown primary   Other Father        spinal cord tumor   Cancer Father    Alcoholism Father    Breast cancer Neg Hx     Allergies  Allergen Reactions   Codeine Phosphate    Fosamax [Alendronate] Other (See Comments)    Muscle aches   Simvastatin     Myalgia     Current Outpatient Medications on File Prior to Visit  Medication Sig Dispense Refill   albuterol (VENTOLIN HFA) 108 (90 Base) MCG/ACT inhaler USE 2 INHALATIONS BY MOUTH  EVERY 6 HOURS AS NEEDED FOR WHEEZING OR SHORTNESS OF  BREATH 34 g 3   amLODipine (NORVASC) 5 MG tablet TAKE 1 TABLET BY MOUTH DAILY 100 tablet 2   diazepam (VALIUM) 2 MG tablet Take 1 tablet (2 mg total) by mouth at bedtime as needed for anxiety or muscle spasms. 30 tablet 0   losartan-hydrochlorothiazide (HYZAAR) 100-25 MG tablet TAKE 1 TABLET BY MOUTH DAILY 100 tablet 0   meclizine (ANTIVERT) 25 MG tablet Take 1 tablet (25 mg total) by mouth daily as needed. 90 tablet 1   omeprazole (PRILOSEC) 20 MG capsule TAKE 1 CAPSULE BY MOUTH DAILY 100 capsule 0   PARoxetine (PAXIL) 30 MG tablet TAKE 2 TABLETS BY MOUTH IN  THE MORNING (Patient taking differently: Take 30 mg by mouth daily.) 180 tablet 3   rosuvastatin (CRESTOR) 5 MG tablet TAKE 1 TABLET BY MOUTH  EVERY OTHER DAY 45 tablet 3   Semaglutide, 1 MG/DOSE, (OZEMPIC, 1 MG/DOSE,) 2 MG/1.5ML SOPN Inject 1 mg into the skin once a week.     SYMBICORT 160-4.5 MCG/ACT inhaler USE 2 INHALATIONS BY MOUTH TWICE DAILY NEED OFFICE VISIT FOR ANY  ADDITIONAL REFILLS 30.6 g 3   traMADol (ULTRAM) 50 MG tablet Take 1 tablet (50 mg total) by mouth every 8 (eight) hours as needed for moderate pain. 60 tablet 0   triamcinolone (KENALOG) 0.025 % cream Apply 1 application topically 2 (two) times daily.     triamcinolone (NASACORT) 55 MCG/ACT AERO nasal inhaler Place 2 sprays into the nose daily. (Patient taking differently: Place 2 sprays into the nose as needed.) 32.4 mL 3   Vitamin D, Ergocalciferol, (DRISDOL)  1.25 MG (50000 UNIT) CAPS capsule Take 1 capsule (50,000 Units total) by mouth every 7 (seven) days. 4 capsule 0   No current facility-administered medications on file prior to visit.    BP 130/80   Pulse 83   Temp 98.3 F (36.8 C) (Oral)   Ht 5'  3" (1.6 m)   Wt 217 lb (98.4 kg)   SpO2 97% Comment: 2.5% o2  BMI 38.44 kg/m       Objective:   Physical Exam Vitals and nursing note reviewed.  Constitutional:      Appearance: Normal appearance.  Cardiovascular:     Rate and Rhythm: Normal rate and regular rhythm.     Pulses: Normal pulses.     Heart sounds: Normal heart sounds.  Musculoskeletal:        General: Normal range of motion.  Skin:    General: Skin is warm and dry.     Capillary Refill: Capillary refill takes less than 2 seconds.  Neurological:     General: No focal deficit present.     Mental Status: She is alert and oriented to person, place, and time.  Psychiatric:        Mood and Affect: Mood normal.        Behavior: Behavior normal.        Thought Content: Thought content normal.        Judgment: Judgment normal.       Assessment & Plan:  1. Chronic pain of right knee Discussed risks and benefits of corticosteroid injection and patient consented.  After prepping skin with betadine, injected 80 mg depomedrol and 2 cc of plain xylocaine with 22 gauge one and one half inch needle using anterolateral approach and pt tolerated well. - methylPREDNISolone acetate (DEPO-MEDROL) injection 80 mg  2. Chronic pain of left knee Discussed risks and benefits of corticosteroid injection and patient consented.  After prepping skin with betadine, injected 80 mg depomedrol and 2 cc of plain xylocaine with 22 gauge one and one half inch needle using anterolateral approach and pt tolerated well. - methylPREDNISolone acetate (DEPO-MEDROL) injection 80 mg  Shirline Frees, NP

## 2023-01-21 ENCOUNTER — Encounter (INDEPENDENT_AMBULATORY_CARE_PROVIDER_SITE_OTHER): Payer: Self-pay | Admitting: Adult Health

## 2023-01-21 ENCOUNTER — Ambulatory Visit (INDEPENDENT_AMBULATORY_CARE_PROVIDER_SITE_OTHER): Payer: Medicare Other | Admitting: Adult Health

## 2023-01-21 VITALS — BP 141/80 | HR 69 | Temp 97.7°F | Ht 63.0 in | Wt 212.0 lb

## 2023-01-21 DIAGNOSIS — M25561 Pain in right knee: Secondary | ICD-10-CM | POA: Diagnosis not present

## 2023-01-21 DIAGNOSIS — M25562 Pain in left knee: Secondary | ICD-10-CM | POA: Diagnosis not present

## 2023-01-21 DIAGNOSIS — I10 Essential (primary) hypertension: Secondary | ICD-10-CM

## 2023-01-21 DIAGNOSIS — G8929 Other chronic pain: Secondary | ICD-10-CM | POA: Diagnosis not present

## 2023-01-21 DIAGNOSIS — E669 Obesity, unspecified: Secondary | ICD-10-CM

## 2023-01-21 DIAGNOSIS — Z6837 Body mass index (BMI) 37.0-37.9, adult: Secondary | ICD-10-CM

## 2023-01-21 NOTE — Progress Notes (Signed)
WEIGHT SUMMARY AND BIOMETRICS  Vitals Temp: 97.7 F (36.5 C) BP: (!) 141/80 Pulse Rate: 69 SpO2: 97 %   Anthropometric Measurements Height: 5\' 3"  (1.6 m) Weight: 212 lb (96.2 kg) BMI (Calculated): 37.56 Weight at Last Visit: 213lb Weight Lost Since Last Visit: 1lb Weight Gained Since Last Visit: 0 Starting Weight: 269lb Total Weight Loss (lbs): 57 lb (25.9 kg)   Body Composition  Body Fat %: 54.7 % Fat Mass (lbs): 116.2 lbs Muscle Mass (lbs): 91.4 lbs Visceral Fat Rating : 18   Other Clinical Data Fasting: no Labs: no Today's Visit #: 19 Starting Date: 08/21/21    Chief Complaint:   OBESITY Laurie Dominguez is here to discuss her progress with her obesity treatment plan. She is on the the Category 3 Plan and states she is following her eating plan approximately 60 % of the time.  She states she is not currently exercising.   Interim History:  The Hostler's will celebrate 23rd Wedding Anniversary tomorrow!  She is down another 1 lb for TWL 58 lbs since starting at [facility] on 08/21/2021  Current weight 212 lbs with corresponding BMI 37.6. Interval gaol loss <200 lbs and receive a fresh, new haircut!  Recent increase in chronic bilateral knee pain- received Depo-Medrol injection from PCP yesterday.  Of Note- Husband at [facility] during OV  Subjective:   1. Essential hypertension BP elevated at OV She endorses increased pain of both knees at present.  She denies CP with exertion.  She is currently on: rosuvastatin (CRESTOR) 5 MG tablet  losartan-hydrochlorothiazide (HYZAAR) 100-25 MG tablet  amLODipine (NORVASC) 5 MG tablet   2. Chronic pain of left knee 01/20/2023 PCP OV Notes- HPI 72 year old female who  has a past medical history of Colon polyp (08/16/2004), COPD (chronic obstructive pulmonary disease) (HCC), Depression, GERD (gastroesophageal reflux disease), High cholesterol, Hypertension, Joint pain, Nephrolithiasis, Obesity, SOB (shortness of breath), and  Swelling of lower extremity.   She presents to the office today for chronic right and left  knee pain. She has end stage osteoarthritis. She had steroid injection into her right knee before with her last being on 04/2021 and had significant relief from the pain. She would like to have both knees injected today  Discussed risks and benefits of corticosteroid injection and patient consented.  After prepping skin with betadine, injected 80 mg depomedrol and 2 cc of plain xylocaine with 22 gauge one and one half inch needle using anterolateral approach and pt tolerated well. - methylPREDNISolone acetate (DEPO-MEDROL) injection 80 mg  3. Chronic pain of right knee 01/20/2023 PCP OV Notes- HPI 72 year old female who  has a past medical history of Colon polyp (08/16/2004), COPD (chronic obstructive pulmonary disease) (HCC), Depression, GERD (gastroesophageal reflux disease), High cholesterol, Hypertension, Joint pain, Nephrolithiasis, Obesity, SOB (shortness of breath), and Swelling of lower extremity.   She presents to the office today for chronic right and left  knee pain. She has end stage osteoarthritis. She had steroid injection into her right knee before with her last being on 04/2021 and had significant relief from the pain. She would like to have both knees injected today  Discussed risks and benefits of corticosteroid injection and patient consented.  After prepping skin with betadine, injected 80 mg depomedrol and 2 cc of plain xylocaine with 22 gauge one and one half inch needle using anterolateral approach and pt tolerated well. - methylPREDNISolone acetate (DEPO-MEDROL) injection 80 mg  Assessment/Plan:   1. Essential hypertension  Continue  rosuvastatin (CRESTOR) 5 MG tablet  losartan-hydrochlorothiazide (HYZAAR) 100-25 MG tablet  amLODipine (NORVASC) 5 MG tablet   2. Chronic pain of left knee Continue with weight loss efforts F/u with PCP PRN  3. Chronic pain of right knee Continue  with weight loss efforts F/u with PCP PRN  4. Obesity (BMI 30-39.9), Current BMI 37.56  Laurie Dominguez is currently in the action stage of change. As such, her goal is to continue with weight loss efforts. She has agreed to the Category 3 Plan.   Exercise goals: Older adults should follow the adult guidelines. When older adults cannot meet the adult guidelines, they should be as physically active as their abilities and conditions will allow.  Older adults should do exercises that maintain or improve balance if they are at risk of falling.  Older adults should determine their level of effort for physical activity relative to their level of fitness.  Older adults with chronic conditions should understand whether and how their conditions affect their ability to do regular physical activity safely.  Behavioral modification strategies: increasing lean protein intake, decreasing simple carbohydrates, increasing vegetables, increasing water intake, decreasing eating out, no skipping meals, meal planning and cooking strategies, keeping healthy foods in the home, ways to avoid boredom eating, better snacking choices, and planning for success.  Icel has agreed to follow-up with our clinic in 4 weeks. She was informed of the importance of frequent follow-up visits to maximize her success with intensive lifestyle modifications for her multiple health conditions.   Objective:   Blood pressure (!) 141/80, pulse 69, temperature 97.7 F (36.5 C), height 5\' 3"  (1.6 m), weight 212 lb (96.2 kg), SpO2 97%. Body mass index is 37.55 kg/m.  General: Cooperative, alert, well developed, in no acute distress. HEENT: Conjunctivae and lids unremarkable. Cardiovascular: Regular rhythm.  Lungs: Normal work of breathing. Neurologic: No focal deficits.   Lab Results  Component Value Date   CREATININE 0.63 11/14/2022   BUN 16 11/14/2022   NA 144 11/14/2022   K 3.6 11/14/2022   CL 100 11/14/2022   CO2 38 (H)  11/14/2022   Lab Results  Component Value Date   ALT 8 11/14/2022   AST 10 11/14/2022   ALKPHOS 75 11/14/2022   BILITOT 0.4 11/14/2022   Lab Results  Component Value Date   HGBA1C 5.3 11/14/2022   HGBA1C 5.4 07/09/2022   HGBA1C 5.4 01/22/2022   HGBA1C 5.6 08/21/2021   HGBA1C 5.8 11/08/2020   Lab Results  Component Value Date   INSULIN 9.2 07/09/2022   INSULIN 11.5 01/22/2022   INSULIN 12.0 08/21/2021   Lab Results  Component Value Date   TSH 3.31 11/14/2022   Lab Results  Component Value Date   CHOL 135 11/14/2022   HDL 36.40 (L) 11/14/2022   LDLCALC 80 11/14/2022   LDLDIRECT 150.5 06/20/2008   TRIG 95.0 11/14/2022   CHOLHDL 4 11/14/2022   Lab Results  Component Value Date   VD25OH 53.9 07/09/2022   VD25OH 53.8 01/22/2022   VD25OH 10.5 (L) 08/21/2021   Lab Results  Component Value Date   WBC 8.6 11/14/2022   HGB 12.9 11/14/2022   HCT 40.2 11/14/2022   MCV 92.9 11/14/2022   PLT 300.0 11/14/2022   No results found for: "IRON", "TIBC", "FERRITIN"  Attestation Statements:   Reviewed by clinician on day of visit: allergies, medications, problem list, medical history, surgical history, family history, social history, and previous encounter notes.  Time spent on visit including pre-visit chart  review and post-visit care and charting was 28 minutes.   I have reviewed the above documentation for accuracy and completeness, and I agree with the above. - Jouri Threat d. Brentton Wardlow, NP-C

## 2023-01-22 ENCOUNTER — Telehealth (INDEPENDENT_AMBULATORY_CARE_PROVIDER_SITE_OTHER): Payer: Self-pay | Admitting: Adult Health

## 2023-01-22 ENCOUNTER — Other Ambulatory Visit (INDEPENDENT_AMBULATORY_CARE_PROVIDER_SITE_OTHER): Payer: Self-pay | Admitting: Adult Health

## 2023-01-22 ENCOUNTER — Telehealth (INDEPENDENT_AMBULATORY_CARE_PROVIDER_SITE_OTHER): Payer: Self-pay

## 2023-01-22 DIAGNOSIS — E559 Vitamin D deficiency, unspecified: Secondary | ICD-10-CM

## 2023-01-22 MED ORDER — VITAMIN D (ERGOCALCIFEROL) 1.25 MG (50000 UNIT) PO CAPS
50000.0000 [IU] | ORAL_CAPSULE | ORAL | 0 refills | Status: DC
Start: 2023-01-22 — End: 2023-03-23

## 2023-01-22 NOTE — Telephone Encounter (Signed)
01/22/23 Pt called in stating that her Vitamin D has not been sent to the pharmacy by [facility]. Please call pt at number on file.

## 2023-01-22 NOTE — Telephone Encounter (Signed)
Called the patient and let her know her Vitamin D prescription was sent in to her pharmacy.

## 2023-01-30 ENCOUNTER — Other Ambulatory Visit: Payer: Self-pay

## 2023-01-30 NOTE — Patient Outreach (Signed)
  Care Management   Visit Note  01/30/2023 Name: Akansha Harrower MRN: [id number] DOB: [birth year]  Subjective: Aleasia Fuge is a 72 y.o. year old female who is a primary care patient of Shirline Frees, NP. The Care Management team was consulted for assistance.      Engaged with patient via telephone regarding Care management needs. Anticipates being available to complete outreach on 02/12/23. Contact information provided. Agreed to call with questions if needed prior to scheduled appointment.    PLAN  Will follow up as scheduled on 02/12/23.   [facility]  [facility] RN Care Manager Direct Dial: [phone] Website: [email or web address]

## 2023-02-12 ENCOUNTER — Other Ambulatory Visit: Payer: Self-pay

## 2023-02-12 NOTE — Patient Outreach (Signed)
  Care Management   Visit Note  02/12/2023 Name: Laurie Dominguez MRN: [id number] DOB: Apr 20, 1950  Subjective: Laurie Dominguez is a 72 y.o. year old female who is a primary care patient of Shirline Frees, NP. The Care Management team was consulted for assistance.      Engaged with patient via telephone  Assessment:  Outpatient Encounter Medications as of 02/12/2023  Medication Sig Note   albuterol (VENTOLIN HFA) 108 (90 Base) MCG/ACT inhaler USE 2 INHALATIONS BY MOUTH  EVERY 6 HOURS AS NEEDED FOR WHEEZING OR SHORTNESS OF  BREATH    amLODipine (NORVASC) 5 MG tablet TAKE 1 TABLET BY MOUTH DAILY    diazepam (VALIUM) 2 MG tablet Take 1 tablet (2 mg total) by mouth at bedtime as needed for anxiety or muscle spasms.    losartan-hydrochlorothiazide (HYZAAR) 100-25 MG tablet TAKE 1 TABLET BY MOUTH DAILY    meclizine (ANTIVERT) 25 MG tablet Take 1 tablet (25 mg total) by mouth daily as needed.    omeprazole (PRILOSEC) 20 MG capsule TAKE 1 CAPSULE BY MOUTH DAILY    PARoxetine (PAXIL) 30 MG tablet TAKE 2 TABLETS BY MOUTH IN  THE MORNING (Patient taking differently: Take 30 mg by mouth daily.) 02/28/2022: Reports taking 30mg  daily   rosuvastatin (CRESTOR) 5 MG tablet TAKE 1 TABLET BY MOUTH  EVERY OTHER DAY    Semaglutide, 1 MG/DOSE, (OZEMPIC, 1 MG/DOSE,) 2 MG/1.5ML SOPN Inject 1 mg into the skin once a week.    SYMBICORT 160-4.5 MCG/ACT inhaler USE 2 INHALATIONS BY MOUTH TWICE DAILY NEED OFFICE VISIT FOR ANY  ADDITIONAL REFILLS    traMADol (ULTRAM) 50 MG tablet Take 1 tablet (50 mg total) by mouth every 8 (eight) hours as needed for moderate pain.    triamcinolone (KENALOG) 0.025 % cream Apply 1 application topically 2 (two) times daily.    triamcinolone (NASACORT) 55 MCG/ACT AERO nasal inhaler Place 2 sprays into the nose daily. (Patient taking differently: Place 2 sprays into the nose as needed.)    Vitamin D, Ergocalciferol, (DRISDOL) 1.25 MG (50000 UNIT) CAPS capsule Take 1 capsule (50,000  Units total) by mouth every 7 (seven) days.    No facility-administered encounter medications on file as of 02/12/2023.

## 2023-02-18 ENCOUNTER — Ambulatory Visit (INDEPENDENT_AMBULATORY_CARE_PROVIDER_SITE_OTHER): Payer: Medicare Other | Admitting: Adult Health

## 2023-03-03 ENCOUNTER — Other Ambulatory Visit: Payer: Self-pay | Admitting: Adult Health

## 2023-03-04 ENCOUNTER — Telehealth: Payer: Self-pay

## 2023-03-04 NOTE — Progress Notes (Signed)
   03/04/2023  Patient ID: Laurie Dominguez, female   DOB: 02-10-1951, 72 y.o.   MRN: [id number]  Contacted patient via telephone to coordinate renewal application for Ozempic for 2025.  Submitted online application through [employer] portal with patient consent, pending company response.  Sherrill Raring, PharmD Clinical Pharmacist [phone]

## 2023-03-08 ENCOUNTER — Other Ambulatory Visit: Payer: Self-pay | Admitting: Adult Health

## 2023-03-13 ENCOUNTER — Encounter: Payer: Self-pay | Admitting: Adult Health

## 2023-03-13 NOTE — Telephone Encounter (Signed)
 Care team updated and letter sent for eye exam notes.

## 2023-03-16 ENCOUNTER — Telehealth: Payer: Self-pay

## 2023-03-16 NOTE — Telephone Encounter (Signed)
Copied from CRM [id number]. Topic: General - Other >> Mar 16, 2023  1:16 PM Pascal Lux wrote: Reason for CRM: Patient stated she is in touch with [employer] Patient Assistance Program for [place] and they said she is missing some information on the application. She would like to drop it off to be reviewed.

## 2023-03-17 NOTE — Telephone Encounter (Signed)
Noted  

## 2023-03-19 NOTE — Telephone Encounter (Signed)
[place] [employer]. Confirmed all needed information has been received and patient has been approved to continue to receive Ozempic through 03/23/24. Eligible for renewal as soon as 01/06/24.  Sherrill Raring, PharmD Clinical Pharmacist [phone]

## 2023-03-23 ENCOUNTER — Encounter (INDEPENDENT_AMBULATORY_CARE_PROVIDER_SITE_OTHER): Payer: Self-pay | Admitting: Adult Health

## 2023-03-23 ENCOUNTER — Ambulatory Visit (INDEPENDENT_AMBULATORY_CARE_PROVIDER_SITE_OTHER): Payer: Medicare Other | Admitting: Adult Health

## 2023-03-23 VITALS — BP 135/82 | HR 74 | Temp 97.5°F | Ht 63.0 in | Wt 205.0 lb

## 2023-03-23 DIAGNOSIS — Z6836 Body mass index (BMI) 36.0-36.9, adult: Secondary | ICD-10-CM | POA: Diagnosis not present

## 2023-03-23 DIAGNOSIS — R7302 Impaired glucose tolerance (oral): Secondary | ICD-10-CM | POA: Diagnosis not present

## 2023-03-23 DIAGNOSIS — J449 Chronic obstructive pulmonary disease, unspecified: Secondary | ICD-10-CM | POA: Diagnosis not present

## 2023-03-23 DIAGNOSIS — E669 Obesity, unspecified: Secondary | ICD-10-CM | POA: Diagnosis not present

## 2023-03-23 DIAGNOSIS — E559 Vitamin D deficiency, unspecified: Secondary | ICD-10-CM

## 2023-03-23 MED ORDER — VITAMIN D (ERGOCALCIFEROL) 1.25 MG (50000 UNIT) PO CAPS: 50000.0000 [IU] | ORAL_CAPSULE | ORAL | 0 refills | Status: DC

## 2023-03-23 NOTE — Progress Notes (Signed)
WEIGHT SUMMARY AND BIOMETRICS  Vitals Temp: (!) 97.5 F (36.4 C) BP: 135/82 Pulse Rate: 74 SpO2: 95 %   Anthropometric Measurements Height: 5\' 3"  (1.6 m) Weight: 205 lb (93 kg) BMI (Calculated): 36.32 Weight at Last Visit: 212 lb Weight Lost Since Last Visit: 7 lb Weight Gained Since Last Visit: 0 Starting Weight: 269 lb Total Weight Loss (lbs): 64 lb (29 kg)   Body Composition  Body Fat %: 52.3 % Fat Mass (lbs): 107.4 lbs Muscle Mass (lbs): 92.8 lbs Visceral Fat Rating : 17   Other Clinical Data Fasting: no Labs: no Today's Visit #: 19 Starting Date: 08/21/21    Chief Complaint:   OBESITY Laurie Dominguez is here to discuss her progress with her obesity treatment plan. She is on the the Category 3 Plan and states she is following her eating plan approximately 60 % of the time.  She states she is exercising  NEAT Activities.   Interim History:  She is down another  7 lbs for TWL 64 lbs since starting at [facility] on 08/21/2021   Current weight 212 lbs with corresponding BMI 37.6. Interval gaol loss <200 lbs and receive a fresh, new haircut!  Reviewed Bioimpedance results with pt: Muscle Mass: +1.4 lbs Adipose Mass: -8.8 lbs  Of note- her wonderful husband at [facility] during OV  Subjective:   1. Chronic obstructive pulmonary disease, unspecified COPD type (HCC) She wears supplemental O2 2.5L 24 hrs/day Since losing a significant amount of weight- she endorses improved mobility and quality of life! She reports being able to "go upstairs" in my house for the first time in years!  2. Impaired glucose tolerance  Latest Reference Range & Units 11/08/20 09:38 08/21/21 12:47 01/22/22 13:04 07/09/22 11:54 11/14/22 08:44  Glucose 70 - 99 mg/dL 161 (H) 096 (H) 97 93 045 (H)  (H): Data is abnormally high Lab Results  Component Value Date   HGBA1C 5.3 11/14/2022   HGBA1C 5.4 07/09/2022   HGBA1C 5.4 01/22/2022    PCP/Cory Nafziger, NP-C manages her weekly Ozempic  1mg  Denies mass in neck, dysphagia, dyspepsia, persistent hoarseness, abdominal pain, or N/V/C   Pt Assistance Program completed for 2025 coverage for GLP-1 therapy  Assessment/Plan:   1. Chronic obstructive pulmonary disease, unspecified COPD type (HCC) Continue with weight loss efforts and increasing daily activity  2. Impaired glucose tolerance Continue with weight loss efforts and increasing daily activity Continue weekly GLP-1 therapy per PCP  3. Obesity (BMI 30-39.9), Current BMI 36.32 (Primary)  Zillie is currently in the action stage of change. As such, her goal is to continue with weight loss efforts. She has agreed to the Category 3 Plan.   Exercise goals: Older adults should follow the adult guidelines. When older adults cannot meet the adult guidelines, they should be as physically active as their abilities and conditions will allow.  Older adults should do exercises that maintain or improve balance if they are at risk of falling.  Older adults should determine their level of effort for physical activity relative to their level of fitness.  Older adults with chronic conditions should understand whether and how their conditions affect their ability to do regular physical activity safely.  Behavioral modification strategies: increasing lean protein intake, decreasing simple carbohydrates, increasing vegetables, increasing water intake, no skipping meals, meal planning and cooking strategies, keeping healthy foods in the home, and planning for success.  [name] has agreed to follow-up with our clinic in 4 weeks. She was informed of the  importance of frequent follow-up visits to maximize her success with intensive lifestyle modifications for her multiple health conditions.   Check Fasting Labs at next OV  Objective:   Blood pressure 135/82, pulse 74, temperature (!) 97.5 F (36.4 C), height 5\' 3"  (1.6 m), weight 205 lb (93 kg), SpO2 95%. Body mass index is 36.31  kg/m.  General: Cooperative, alert, well developed, in no acute distress. HEENT: Conjunctivae and lids unremarkable. Cardiovascular: Regular rhythm.  Lungs: Normal work of breathing. Neurologic: No focal deficits.   Lab Results  Component Value Date   CREATININE 0.63 11/14/2022   BUN 16 11/14/2022   NA 144 11/14/2022   K 3.6 11/14/2022   CL 100 11/14/2022   CO2 38 (H) 11/14/2022   Lab Results  Component Value Date   ALT 8 11/14/2022   AST 10 11/14/2022   ALKPHOS 75 11/14/2022   BILITOT 0.4 11/14/2022   Lab Results  Component Value Date   HGBA1C 5.3 11/14/2022   HGBA1C 5.4 07/09/2022   HGBA1C 5.4 01/22/2022   HGBA1C 5.6 08/21/2021   HGBA1C 5.8 11/08/2020   Lab Results  Component Value Date   INSULIN 9.2 07/09/2022   INSULIN 11.5 01/22/2022   INSULIN 12.0 08/21/2021   Lab Results  Component Value Date   TSH 3.31 11/14/2022   Lab Results  Component Value Date   CHOL 135 11/14/2022   HDL 36.40 (L) 11/14/2022   LDLCALC 80 11/14/2022   LDLDIRECT 150.5 06/20/2008   TRIG 95.0 11/14/2022   CHOLHDL 4 11/14/2022   Lab Results  Component Value Date   VD25OH 53.9 07/09/2022   VD25OH 53.8 01/22/2022   VD25OH 10.5 (L) 08/21/2021   Lab Results  Component Value Date   WBC 8.6 11/14/2022   HGB 12.9 11/14/2022   HCT 40.2 11/14/2022   MCV 92.9 11/14/2022   PLT 300.0 11/14/2022   No results found for: "IRON", "TIBC", "FERRITIN"  Attestation Statements:   Reviewed by clinician on day of visit: allergies, medications, problem list, medical history, surgical history, family history, social history, and previous encounter notes.  Time spent on visit including pre-visit chart review and post-visit care and charting was 29 minutes.   I have reviewed the above documentation for accuracy and completeness, and I agree with the above. -  Syliva Mee d. Juniel Groene, NP-C

## 2023-03-31 DIAGNOSIS — Z85828 Personal history of other malignant neoplasm of skin: Secondary | ICD-10-CM | POA: Diagnosis not present

## 2023-03-31 DIAGNOSIS — L570 Actinic keratosis: Secondary | ICD-10-CM | POA: Diagnosis not present

## 2023-03-31 DIAGNOSIS — L821 Other seborrheic keratosis: Secondary | ICD-10-CM | POA: Diagnosis not present

## 2023-03-31 DIAGNOSIS — L304 Erythema intertrigo: Secondary | ICD-10-CM | POA: Diagnosis not present

## 2023-03-31 DIAGNOSIS — L218 Other seborrheic dermatitis: Secondary | ICD-10-CM | POA: Diagnosis not present

## 2023-04-03 ENCOUNTER — Telehealth: Payer: Self-pay

## 2023-04-03 NOTE — Telephone Encounter (Signed)
 Copied from CRM [id number]. Topic: Clinical - Medication Question >> Apr 03, 2023 11:10 AM Drema MATSU wrote: Reason for CRM: Patient husband wants to know if Patient 6 month supply of Ozempic  prescription has came in yet. He states he would like the pharmacist to call him back.

## 2023-04-03 NOTE — Telephone Encounter (Signed)
 Pt spouse notified that Rx is not her yet. She will call back Wednesday to check the status.

## 2023-04-08 ENCOUNTER — Other Ambulatory Visit: Payer: Self-pay | Admitting: Adult Health

## 2023-04-08 NOTE — Telephone Encounter (Signed)
 Copied from CRM [id number]. Topic: Clinical - Medication Refill >> Apr 08, 2023  1:35 PM Laurie Dominguez wrote: Most Recent Primary Care Visit:  Provider: Napoleon Backer D  Department: [facility] WEIGHT MGT  Visit Type: FOLLOW UP  Date: 03/23/2023  Medication: ***  Has the patient contacted their pharmacy?  (Agent: If no, request that the patient contact the pharmacy for the refill. If patient does not wish to contact the pharmacy document the reason why and proceed with request.) (Agent: If yes, when and what did the pharmacy advise?)  Is this the correct pharmacy for this prescription?  If no, delete pharmacy and type the correct one.  This is the patient's preferred pharmacy:  [facility] [address] [place] - [address] [place] [address] Phone: [phone] Fax: [phone]  [facility]) - [place], [place] - [address] [place] [address] Phone: [phone] Fax: [phone]  [id number] [place], [place] [address] [facility] [address] [place] [address] Phone: [phone] Fax: [phone]  [facility] - [place], [place] - [address] [place] [address] Phone: [phone] Fax: [phone]   Has the prescription been filled recently?   Is the patient out of the medication?   Has the patient been seen for an appointment in the last year OR does the patient have an upcoming appointment?   Can we respond through MyChart?   Agent: Please be advised that Rx refills may take up to 3 business days. We ask that you follow-up with your pharmacy.

## 2023-04-10 NOTE — Telephone Encounter (Signed)
Called pt to advised that Rx is ready for pick up from pt assistance program. Pt verbalized understanding.

## 2023-04-14 ENCOUNTER — Telehealth: Payer: Self-pay | Admitting: Acute Care

## 2023-04-14 MED ORDER — BUDESONIDE-FORMOTEROL FUMARATE 160-4.5 MCG/ACT IN AERO: 2.0000 | INHALATION_SPRAY | Freq: Every day | RESPIRATORY_TRACT | 0 refills | Status: AC

## 2023-04-14 NOTE — Telephone Encounter (Signed)
Patient states needs refill for Symbicort. Patient out of medication. Pharmacy is [facility]. [facility] [employer] [facility]. Patient phone number is [phone].

## 2023-04-14 NOTE — Telephone Encounter (Signed)
Refill has been sent to Pharmacy, pt called and notified verbalized understanding and denied any other concerns

## 2023-04-22 ENCOUNTER — Ambulatory Visit (INDEPENDENT_AMBULATORY_CARE_PROVIDER_SITE_OTHER): Payer: Medicare Other | Admitting: Adult Health

## 2023-04-22 ENCOUNTER — Encounter (INDEPENDENT_AMBULATORY_CARE_PROVIDER_SITE_OTHER): Payer: Self-pay | Admitting: Adult Health

## 2023-04-22 VITALS — BP 128/75 | HR 83 | Temp 98.1°F | Ht 63.0 in | Wt 206.0 lb

## 2023-04-22 DIAGNOSIS — R7302 Impaired glucose tolerance (oral): Secondary | ICD-10-CM | POA: Diagnosis not present

## 2023-04-22 DIAGNOSIS — E669 Obesity, unspecified: Secondary | ICD-10-CM | POA: Diagnosis not present

## 2023-04-22 DIAGNOSIS — Z6836 Body mass index (BMI) 36.0-36.9, adult: Secondary | ICD-10-CM

## 2023-04-22 DIAGNOSIS — E559 Vitamin D deficiency, unspecified: Secondary | ICD-10-CM

## 2023-04-22 DIAGNOSIS — I10 Essential (primary) hypertension: Secondary | ICD-10-CM

## 2023-04-22 MED ORDER — VITAMIN D (ERGOCALCIFEROL) 1.25 MG (50000 UNIT) PO CAPS: 50000.0000 [IU] | ORAL_CAPSULE | ORAL | 0 refills | Status: DC

## 2023-04-22 NOTE — Progress Notes (Signed)
WEIGHT SUMMARY AND BIOMETRICS  Vitals Temp: 98.1 F (36.7 C) BP: 128/75 Pulse Rate: 83 SpO2: 93 %   Anthropometric Measurements Height: 5\' 3"  (1.6 m) Weight: 206 lb (93.4 kg) BMI (Calculated): 36.5 Weight at Last Visit: 205lb Weight Lost Since Last Visit: 0 Weight Gained Since Last Visit: 1lb Starting Weight: 269lb Total Weight Loss (lbs): 63 lb (28.6 kg)   Body Composition  Body Fat %: 53.1 % Fat Mass (lbs): 109.8 lbs Muscle Mass (lbs): 92 lbs Visceral Fat Rating : 17   Other Clinical Data Fasting: yes Labs: yes Today's Visit #: 20 Starting Date: 08/21/21    Chief Complaint:   OBESITY Laurie Dominguez is here to discuss her progress with her obesity treatment plan. She is on the the Category 3 Plan and states she is following her eating plan approximately 50 % of the time.  She states she is exercising: NEAT Activities   Interim History:  PCP managed weekly Ozempic 1mg  Denies mass in neck, dysphagia, persistent hoarseness, abdominal pain, or N/V/C   2025 Health Goal Start and maintain a regular exercise regime  Of note- her wonderful husband is at [facility] during OV  Subjective:   1. Vitamin D deficiency She is on weekly Ergocalciferol- denies N/V/Muscle Weakness  2. Essential hypertension BP at goal at OV She denies CP with exertion. She is on: rosuvastatin (CRESTOR) 5 MG tablet  amLODipine (NORVASC) 5 MG tablet  losartan-hydrochlorothiazide (HYZAAR) 100-25 MG tablet   3. Impaired glucose tolerance PCP/Cory Nafzinger NP-C- has her on patient assistance program with [employer] She is on weekly Ozempic 1mg  injection She injects on Fridays. Denies mass in neck, dysphagia, persistent hoarseness, abdominal pain, or N/V/C   If she experiences breakthrough dyspepsia- she will treat with OTC Tums -only requires 1 tab She is on daily Prilosec 20mg   Assessment/Plan:   1. Vitamin D deficiency (Primary) Check Labs and Refill - Vitamin D,  Ergocalciferol, (DRISDOL) 1.25 MG (50000 UNIT) CAPS capsule; Take 1 capsule (50,000 Units total) by mouth every 7 (seven) days.  Dispense: 4 capsule; Refill: 0 - VITAMIN D 25 Hydroxy (Vit-D Deficiency, Fractures)  2. Essential hypertension Check Labs - Comprehensive metabolic panel  3. Impaired glucose tolerance Check Labs - Insulin, random - Hemoglobin A1c  4. Obesity (BMI 30-39.9), Current BMI 36.5  Laurie Dominguez is currently in the action stage of change. As such, her goal is to continue with weight loss efforts. She has agreed to the Category 3 Plan.   Exercise goals: Older adults should follow the adult guidelines. When older adults cannot meet the adult guidelines, they should be as physically active as their abilities and conditions will allow.  Older adults should do exercises that maintain or improve balance if they are at risk of falling.  Older adults should determine their level of effort for physical activity relative to their level of fitness.  Older adults with chronic conditions should understand whether and how their conditions affect their ability to do regular physical activity safely.  Behavioral modification strategies: increasing lean protein intake, decreasing simple carbohydrates, increasing vegetables, increasing water intake, no skipping meals, meal planning and cooking strategies, keeping healthy foods in the home, ways to avoid boredom eating, ways to avoid night time snacking, and planning for success.  Laurie Dominguez has agreed to follow-up with our clinic in 4 weeks. She was informed of the importance of frequent follow-up visits to maximize her success with intensive lifestyle modifications for her multiple health conditions.   Laurie Dominguez was  informed we would discuss her lab results at her next visit unless there is a critical issue that needs to be addressed sooner. Laurie Dominguez agreed to keep her next visit at the agreed upon time to discuss these results.  Objective:    Blood pressure 128/75, pulse 83, temperature 98.1 F (36.7 C), height 5\' 3"  (1.6 m), weight 206 lb (93.4 kg), SpO2 93%. Body mass index is 36.49 kg/m.  General: Cooperative, alert, well developed, in no acute distress. HEENT: Conjunctivae and lids unremarkable. Cardiovascular: Regular rhythm.  Lungs: Normal work of breathing. Neurologic: No focal deficits.   Lab Results  Component Value Date   CREATININE 0.63 11/14/2022   BUN 16 11/14/2022   NA 144 11/14/2022   K 3.6 11/14/2022   CL 100 11/14/2022   CO2 38 (H) 11/14/2022   Lab Results  Component Value Date   ALT 8 11/14/2022   AST 10 11/14/2022   ALKPHOS 75 11/14/2022   BILITOT 0.4 11/14/2022   Lab Results  Component Value Date   HGBA1C 5.3 11/14/2022   HGBA1C 5.4 07/09/2022   HGBA1C 5.4 01/22/2022   HGBA1C 5.6 08/21/2021   HGBA1C 5.8 11/08/2020   Lab Results  Component Value Date   INSULIN 9.2 07/09/2022   INSULIN 11.5 01/22/2022   INSULIN 12.0 08/21/2021   Lab Results  Component Value Date   TSH 3.31 11/14/2022   Lab Results  Component Value Date   CHOL 135 11/14/2022   HDL 36.40 (L) 11/14/2022   LDLCALC 80 11/14/2022   LDLDIRECT 150.5 06/20/2008   TRIG 95.0 11/14/2022   CHOLHDL 4 11/14/2022   Lab Results  Component Value Date   VD25OH 53.9 07/09/2022   VD25OH 53.8 01/22/2022   VD25OH 10.5 (L) 08/21/2021   Lab Results  Component Value Date   WBC 8.6 11/14/2022   HGB 12.9 11/14/2022   HCT 40.2 11/14/2022   MCV 92.9 11/14/2022   PLT 300.0 11/14/2022   No results found for: "IRON", "TIBC", "FERRITIN"  Attestation Statements:   Reviewed by clinician on day of visit: allergies, medications, problem list, medical history, surgical history, family history, social history, and previous encounter notes.  I have reviewed the above documentation for accuracy and completeness, and I agree with the above. -  Laurie Dominguez d. Laurie Rizo, NP-C

## 2023-04-23 LAB — HEMOGLOBIN A1C
Est. average glucose Bld gHb Est-mCnc: 108 mg/dL
Hgb A1c MFr Bld: 5.4 % (ref 4.8–5.6)

## 2023-04-23 LAB — COMPREHENSIVE METABOLIC PANEL
ALT: 7 [IU]/L (ref 0–32)
AST: 7 [IU]/L (ref 0–40)
Albumin: 4 g/dL (ref 3.8–4.8)
Alkaline Phosphatase: 106 [IU]/L (ref 44–121)
BUN/Creatinine Ratio: 25 (ref 12–28)
BUN: 17 mg/dL (ref 8–27)
Bilirubin Total: 0.3 mg/dL (ref 0.0–1.2)
CO2: 29 mmol/L (ref 20–29)
Calcium: 9.7 mg/dL (ref 8.7–10.3)
Chloride: 101 mmol/L (ref 96–106)
Creatinine, Ser: 0.68 mg/dL (ref 0.57–1.00)
Globulin, Total: 2.4 g/dL (ref 1.5–4.5)
Glucose: 87 mg/dL (ref 70–99)
Potassium: 3.7 mmol/L (ref 3.5–5.2)
Sodium: 145 mmol/L — ABNORMAL HIGH (ref 134–144)
Total Protein: 6.4 g/dL (ref 6.0–8.5)
eGFR: 92 mL/min/{1.73_m2} (ref >59)

## 2023-04-23 LAB — INSULIN, RANDOM: INSULIN: 8.2 u[IU]/mL (ref 2.6–24.9)

## 2023-04-23 LAB — VITAMIN D 25 HYDROXY (VIT D DEFICIENCY, FRACTURES): Vit D, 25-Hydroxy: 46.2 ng/mL (ref 30.0–100.0)

## 2023-05-03 ENCOUNTER — Other Ambulatory Visit: Payer: Self-pay | Admitting: Adult Health

## 2023-05-05 ENCOUNTER — Telehealth: Payer: Self-pay

## 2023-05-05 NOTE — Telephone Encounter (Signed)
Called pt to advise that Pt assistant medication is ready for pick up.

## 2023-05-26 ENCOUNTER — Ambulatory Visit (INDEPENDENT_AMBULATORY_CARE_PROVIDER_SITE_OTHER): Payer: Medicare Other | Admitting: Adult Health

## 2023-05-26 ENCOUNTER — Encounter (INDEPENDENT_AMBULATORY_CARE_PROVIDER_SITE_OTHER): Payer: Self-pay | Admitting: Adult Health

## 2023-05-26 VITALS — BP 133/77 | HR 74 | Temp 98.5°F | Ht 60.0 in | Wt 208.0 lb

## 2023-05-26 DIAGNOSIS — R7302 Impaired glucose tolerance (oral): Secondary | ICD-10-CM

## 2023-05-26 DIAGNOSIS — I10 Essential (primary) hypertension: Secondary | ICD-10-CM | POA: Diagnosis not present

## 2023-05-26 DIAGNOSIS — J449 Chronic obstructive pulmonary disease, unspecified: Secondary | ICD-10-CM

## 2023-05-26 DIAGNOSIS — E669 Obesity, unspecified: Secondary | ICD-10-CM

## 2023-05-26 DIAGNOSIS — Z6836 Body mass index (BMI) 36.0-36.9, adult: Secondary | ICD-10-CM

## 2023-05-26 DIAGNOSIS — E559 Vitamin D deficiency, unspecified: Secondary | ICD-10-CM

## 2023-05-26 MED ORDER — VITAMIN D (ERGOCALCIFEROL) 1.25 MG (50000 UNIT) PO CAPS: 50000.0000 [IU] | ORAL_CAPSULE | ORAL | 0 refills | Status: DC

## 2023-05-26 NOTE — Progress Notes (Signed)
 WEIGHT SUMMARY AND BIOMETRICS  Vitals Temp: 98.5 F (36.9 C) BP: 133/77 Pulse Rate: 74 SpO2: 94 %   Anthropometric Measurements Height: 5\' 3"  (1.6 m) Weight: 208 lb (94.3 kg) BMI (Calculated): 36.85 Weight at Last Visit: 206 lb Weight Lost Since Last Visit: 0 Weight Gained Since Last Visit: 2 lb Starting Weight: 269 lb Total Weight Loss (lbs): 61 lb (27.7 kg)   Body Composition  Body Fat %: 53.1 % Fat Mass (lbs): 110.4 lbs Muscle Mass (lbs): 97.6 lbs Total Body Water (lbs): 92.6 lbs Visceral Fat Rating : 17   Other Clinical Data Fasting: no Labs: no Today's Visit #: 21 Starting Date: 08/21/21    Chief Complaint:   OBESITY Joannie is here to discuss her progress with her obesity treatment plan.  She is on the the Category 3 Plan and states she is following her eating plan approximately 50 % of the time.  She states she is exercising: None   Interim History:  Ms. Plouffe reports increased snacking and subsequent consumption of simple CHO. The weather has kept her home more the last several weeks.  Exercise-limited due to immobility and need for constant supplemental O2.  Hydration-Sips of plain water per day.  She prefers to hydrate with tea sweetened with stevia and calorie free leonade.  She will celebrate her 73th birthday this month and her 24th wedding anniversary this Oct!  Subjective:   1. Vitamin D deficiency  Latest Reference Range & Units 08/21/21 12:47 01/22/22 13:04 07/09/22 11:54 04/22/23 10:57  Vitamin D, 25-Hydroxy 30.0 - 100.0 ng/mL 10.5 (L) 53.8 53.9 46.2  (L): Data is abnormally low  She is on weekly Ergocalciferol- denies N/V/Muscle Weakness  2. Essential hypertension BP stable at OV PCP manages amLODipine (NORVASC) 5 MG tablet  losartan-hydrochlorothiazide (HYZAAR) 100-25 MG tablet  rosuvastatin (CRESTOR) 5 MG tablet   3. Impaired glucose tolerance  Latest Reference Range & Units 08/21/21 12:47 01/22/22 13:04 07/09/22  11:54 11/14/22 08:44 04/22/23 10:57  Glucose 70 - 99 mg/dL 161 (H) 97 93 096 (H) 87  (H): Data is abnormally high  PCP/Cory Nafziger, NP-C manages Ozempic therapy 08/13/2022- PCP/Cory Nafziger, NP-C increased Ozempic 0.5mg  to 1mg   She has tolerated increased dose- denies mass in neck, dysphagia, dyspepsia, persistent hoarseness, abdominal pain, or N/V/C   4. Chronic obstructive pulmonary disease, unspecified COPD type (HCC) She is currently on albuterol (VENTOLIN HFA) 108 (90 Base) MCG/ACT inhaler  triamcinolone (NASACORT) 55 MCG/ACT AERO nasal inhaler  budesonide-formoterol (SYMBICORT) 160-4.5 MCG/ACT inhaler   She requires constant supplemental O2  Assessment/Plan:   1. Vitamin D deficiency (Primary) Refill - Vitamin D, Ergocalciferol, (DRISDOL) 1.25 MG (50000 UNIT) CAPS capsule; Take 1 capsule (50,000 Units total) by mouth every 7 (seven) days.  Dispense: 4 capsule; Refill: 0  2. Essential hypertension Limit Na+ Increase plan compliance to at least 80% on Cat 3 Meal Plan Increase plain water intake  3. Impaired glucose tolerance Limit simple CHO/sugar intake Increase lean protein intake Increase plan compliance to at least 80% on Cat 3 Meal Plan Increase plain water intake  4. Chronic obstructive pulmonary disease, unspecified COPD type (HCC) Remain tobacco free F/u with Pulmonologist as directed  5. Obesity (BMI 30-39.9), Current BMI 36.8  Shereta is currently in the action stage of change. As such, her goal is to continue with weight loss efforts. She has agreed to the Category 3 Plan.   Exercise goals: Older adults should follow the adult guidelines. When older adults cannot meet  the adult guidelines, they should be as physically active as their abilities and conditions will allow.  Older adults should do exercises that maintain or improve balance if they are at risk of falling.  Older adults should determine their level of effort for physical activity relative to  their level of fitness.  Older adults with chronic conditions should understand whether and how their conditions affect their ability to do regular physical activity safely.  Behavioral modification strategies: increasing lean protein intake, decreasing simple carbohydrates, increasing vegetables, increasing water intake, no skipping meals, meal planning and cooking strategies, keeping healthy foods in the home, ways to avoid boredom eating, better snacking choices, emotional eating strategies, and planning for success.  Vernica has agreed to follow-up with our clinic in 4 weeks. She was informed of the importance of frequent follow-up visits to maximize her success with intensive lifestyle modifications for her multiple health conditions.   Objective:   Blood pressure 133/77, pulse 74, temperature 98.5 F (36.9 C), height 5\' 3"  (1.6 m), weight 208 lb (94.3 kg), SpO2 94%. Body mass index is 36.85 kg/m.  General: Cooperative, alert, well developed, in no acute distress. HEENT: Conjunctivae and lids unremarkable. Cardiovascular: Regular rhythm.  Lungs: Normal work of breathing. Neurologic: No focal deficits.   Lab Results  Component Value Date   CREATININE 0.68 04/22/2023   BUN 17 04/22/2023   NA 145 (H) 04/22/2023   K 3.7 04/22/2023   CL 101 04/22/2023   CO2 29 04/22/2023   Lab Results  Component Value Date   ALT 7 04/22/2023   AST 7 04/22/2023   ALKPHOS 106 04/22/2023   BILITOT 0.3 04/22/2023   Lab Results  Component Value Date   HGBA1C 5.4 04/22/2023   HGBA1C 5.3 11/14/2022   HGBA1C 5.4 07/09/2022   HGBA1C 5.4 01/22/2022   HGBA1C 5.6 08/21/2021   Lab Results  Component Value Date   INSULIN 8.2 04/22/2023   INSULIN 9.2 07/09/2022   INSULIN 11.5 01/22/2022   INSULIN 12.0 08/21/2021   Lab Results  Component Value Date   TSH 3.31 11/14/2022   Lab Results  Component Value Date   CHOL 135 11/14/2022   HDL 36.40 (L) 11/14/2022   LDLCALC 80 11/14/2022   LDLDIRECT  150.5 06/20/2008   TRIG 95.0 11/14/2022   CHOLHDL 4 11/14/2022   Lab Results  Component Value Date   VD25OH 46.2 04/22/2023   VD25OH 53.9 07/09/2022   VD25OH 53.8 01/22/2022   Lab Results  Component Value Date   WBC 8.6 11/14/2022   HGB 12.9 11/14/2022   HCT 40.2 11/14/2022   MCV 92.9 11/14/2022   PLT 300.0 11/14/2022   No results found for: "IRON", "TIBC", "FERRITIN"  Attestation Statements:   Reviewed by clinician on day of visit: allergies, medications, problem list, medical history, surgical history, family history, social history, and previous encounter notes.  I have reviewed the above documentation for accuracy and completeness, and I agree with the above. -  Iasha Mccalister d. Tyronda Vizcarrondo, NP-C

## 2023-05-28 ENCOUNTER — Ambulatory Visit: Admitting: Adult Health

## 2023-05-28 VITALS — BP 130/82 | HR 69 | Temp 98.0°F | Ht 63.0 in | Wt 213.0 lb

## 2023-05-28 DIAGNOSIS — R7302 Impaired glucose tolerance (oral): Secondary | ICD-10-CM | POA: Diagnosis not present

## 2023-05-28 DIAGNOSIS — G8929 Other chronic pain: Secondary | ICD-10-CM | POA: Diagnosis not present

## 2023-05-28 DIAGNOSIS — M25562 Pain in left knee: Secondary | ICD-10-CM | POA: Diagnosis not present

## 2023-05-28 DIAGNOSIS — E669 Obesity, unspecified: Secondary | ICD-10-CM

## 2023-05-28 DIAGNOSIS — M25561 Pain in right knee: Secondary | ICD-10-CM | POA: Diagnosis not present

## 2023-05-28 DIAGNOSIS — Z6837 Body mass index (BMI) 37.0-37.9, adult: Secondary | ICD-10-CM

## 2023-05-28 MED ORDER — METHYLPREDNISOLONE ACETATE 80 MG/ML IJ SUSP: 80.0000 mg | Freq: Once | INTRAMUSCULAR | Status: DC

## 2023-05-28 MED ORDER — METHYLPREDNISOLONE ACETATE 80 MG/ML IJ SUSP
80.0000 mg | Freq: Once | INTRAMUSCULAR | Status: AC
  Administered 2023-05-28: 80 mg via INTRA_ARTICULAR

## 2023-05-28 MED ORDER — SEMAGLUTIDE (2 MG/DOSE) 8 MG/3ML ~~LOC~~ SOPN: 2.0000 mg | PEN_INJECTOR | SUBCUTANEOUS | 1 refills | Status: AC

## 2023-05-28 NOTE — Progress Notes (Signed)
 Subjective:    Patient ID: Laurie Dominguez, female    DOB: [birth year], 73 y.o.   MRN: [id number]  HPI  73 year old female who  has a past medical history of Colon polyp (08/16/2004), COPD (chronic obstructive pulmonary disease) (HCC), Depression, GERD (gastroesophageal reflux disease), High cholesterol, Hypertension, Joint pain, Nephrolithiasis, Obesity, SOB (shortness of breath), and Swelling of lower extremity.  She presents to the office today for chronic knee pain bilaterally.    She has end stage osteoarthritis. Her last steroid injects were in October 2024 ( 5 months ago). She continues to get benefit from the steroid injection.   She would also like to go up on her Ozempic. She is currently on 1 mg weekly and has found that her weight is stalled and her appetite is increasing.     Review of Systems See HPI   Past Medical History:  Diagnosis Date   Colon polyp 08/16/2004   Hyperplastic   COPD (chronic obstructive pulmonary disease) (HCC)    Depression    GERD (gastroesophageal reflux disease)    High cholesterol    Hypertension    Joint pain    Nephrolithiasis    Obesity    SOB (shortness of breath)    Swelling of lower extremity     Social History   Socioeconomic History   Marital status: Married    Spouse name: FW   Number of children: Not on file   Years of education: Not on file   Highest education level: Bachelor's degree (e.g., BA, AB, BS)  Occupational History   Occupation: Retired  Tobacco Use   Smoking status: Former    Current packs/day: 0.00    Average packs/day: 2.0 packs/day for 40.0 years (80.0 ttl pk-yrs)    Types: Cigarettes    Start date: 03/24/1966    Quit date: 03/24/2006    Years since quitting: 17.1   Smokeless tobacco: Never  Vaping Use   Vaping status: Never Used  Substance and Sexual Activity   Alcohol use: No    Alcohol/week: 0.0 standard drinks of alcohol   Drug use: No   Sexual activity: Not on file  Other Topics Concern    Not on file  Social History Narrative   Not on file   Social Drivers of Health   Financial Resource Strain: Low Risk  (02/12/2023)   Overall Financial Resource Strain (CARDIA)    Difficulty of Paying Living Expenses: Not very hard  Food Insecurity: No Food Insecurity (02/12/2023)   Hunger Vital Sign    Worried About Running Out of Food in the Last Year: Never true    Ran Out of Food in the Last Year: Never true  Transportation Needs: No Transportation Needs (02/12/2023)   PRAPARE - Administrator, Civil Service (Medical): No    Lack of Transportation (Non-Medical): No  Physical Activity: Inactive (02/12/2023)   Exercise Vital Sign    Days of Exercise per Week: 0 days    Minutes of Exercise per Session: 0 min  Stress: No Stress Concern Present (02/12/2023)   [employer] of Occupational Health - Occupational Stress Questionnaire    Feeling of Stress : Only a little  Social Connections: Moderately Integrated (02/12/2023)   Social Connection and Isolation Panel [NHANES]    Frequency of Communication with Friends and Family: Twice a week    Frequency of Social Gatherings with Friends and Family: Twice a week    Attends Religious Services: More than 4 times  per year    Active Member of Clubs or Organizations: No    Attends Banker Meetings: Never    Marital Status: Married  Catering manager Violence: Unknown (02/12/2023)   Humiliation, Afraid, Rape, and Kick questionnaire    Fear of Current or Ex-Partner: No    Emotionally Abused: No    Physically Abused: No    Sexually Abused: Not on file    Past Surgical History:  Procedure Laterality Date   BREAST BIOPSY Left 12/2017   CHOLECYSTECTOMY     COLONOSCOPY  2006   DILATION AND CURETTAGE OF UTERUS     MOHS SURGERY     TONSILLECTOMY      Family History  Problem Relation Age of Onset   Cancer Mother        unknown primary   Liver cancer Mother        unknown primary   Other Father         spinal cord tumor   Cancer Father    Alcoholism Father    Breast cancer Neg Hx     Allergies  Allergen Reactions   Codeine Phosphate    Fosamax [Alendronate] Other (See Comments)    Muscle aches   Simvastatin     Myalgia     Current Outpatient Medications on File Prior to Visit  Medication Sig Dispense Refill   albuterol (VENTOLIN HFA) 108 (90 Base) MCG/ACT inhaler USE 2 INHALATIONS BY MOUTH  EVERY 6 HOURS AS NEEDED FOR WHEEZING OR SHORTNESS OF  BREATH 34 g 3   amLODipine (NORVASC) 5 MG tablet TAKE 1 TABLET BY MOUTH DAILY 100 tablet 2   budesonide-formoterol (SYMBICORT) 160-4.5 MCG/ACT inhaler Inhale 2 puffs into the lungs daily. 30.6 g 0   diazepam (VALIUM) 2 MG tablet Take 1 tablet (2 mg total) by mouth at bedtime as needed for anxiety or muscle spasms. 30 tablet 0   losartan-hydrochlorothiazide (HYZAAR) 100-25 MG tablet TAKE 1 TABLET BY MOUTH DAILY 100 tablet 3   meclizine (ANTIVERT) 25 MG tablet Take 1 tablet (25 mg total) by mouth daily as needed. 90 tablet 1   omeprazole (PRILOSEC) 20 MG capsule TAKE 1 CAPSULE BY MOUTH DAILY 100 capsule 2   PARoxetine (PAXIL) 30 MG tablet TAKE 2 TABLETS BY MOUTH IN  THE MORNING (Patient taking differently: Take 30 mg by mouth daily.) 180 tablet 3   rosuvastatin (CRESTOR) 5 MG tablet TAKE 1 TABLET BY MOUTH EVERY  OTHER DAY 50 tablet 2   traMADol (ULTRAM) 50 MG tablet Take 1 tablet (50 mg total) by mouth every 8 (eight) hours as needed for moderate pain. 60 tablet 0   triamcinolone (KENALOG) 0.025 % cream Apply 1 application topically 2 (two) times daily.     triamcinolone (NASACORT) 55 MCG/ACT AERO nasal inhaler Place 2 sprays into the nose daily. (Patient taking differently: Place 2 sprays into the nose as needed.) 32.4 mL 3   Vitamin D, Ergocalciferol, (DRISDOL) 1.25 MG (50000 UNIT) CAPS capsule Take 1 capsule (50,000 Units total) by mouth every 7 (seven) days. 4 capsule 0   No current facility-administered medications on file prior to visit.     BP 130/82   Pulse 69   Temp 98 F (36.7 C) (Oral)   Ht 5\' 3"  (1.6 m)   Wt 213 lb (96.6 kg)   SpO2 96%   BMI 37.73 kg/m       Objective:   Physical Exam Vitals and nursing note reviewed.  Constitutional:  Appearance: Normal appearance.  Cardiovascular:     Rate and Rhythm: Normal rate and regular rhythm.     Pulses: Normal pulses.     Heart sounds: Normal heart sounds.  Pulmonary:     Effort: Pulmonary effort is normal.     Breath sounds: Normal breath sounds.  Musculoskeletal:        General: Normal range of motion.  Skin:    General: Skin is warm and dry.  Neurological:     Mental Status: She is alert.     Motor: Weakness present.  Psychiatric:        Mood and Affect: Mood normal.        Behavior: Behavior normal.        Thought Content: Thought content normal.        Judgment: Judgment normal.       Assessment & Plan:  1. Chronic pain of right knee (Primary) Discussed risks and benefits of corticosteroid injection and patient consented.  After prepping skin with betadine, injected 80 mg depomedrol and 2 cc of plain xylocaine with 22 gauge one and one half inch needle using anterolateral approach and pt tolerated well.  - methylPREDNISolone acetate (DEPO-MEDROL) injection 80 mg  2. Chronic pain of left knee Discussed risks and benefits of corticosteroid injection and patient consented.  After prepping skin with betadine, injected 80 mg depomedrol and 2 cc of plain xylocaine with 22 gauge one and one half inch needle using anterolateral approach and pt tolerated well.  - methylPREDNISolone acetate (DEPO-MEDROL) injection 80 mg  3. Impaired glucose tolerance  - Semaglutide, 2 MG/DOSE, 8 MG/3ML SOPN; Inject 2 mg as directed once a week.  Dispense: 9 mL; Refill: 1  4. Obesity (BMI 30-39.9)  - Semaglutide, 2 MG/DOSE, 8 MG/3ML SOPN; Inject 2 mg as directed once a week.  Dispense: 9 mL; Refill: 1   Shirline Frees, NP

## 2023-06-24 ENCOUNTER — Encounter (INDEPENDENT_AMBULATORY_CARE_PROVIDER_SITE_OTHER): Payer: Self-pay | Admitting: Adult Health

## 2023-06-24 ENCOUNTER — Ambulatory Visit (INDEPENDENT_AMBULATORY_CARE_PROVIDER_SITE_OTHER): Admitting: Adult Health

## 2023-06-24 VITALS — BP 90/65 | HR 63 | Temp 98.2°F | Ht 60.0 in | Wt 207.0 lb

## 2023-06-24 DIAGNOSIS — E559 Vitamin D deficiency, unspecified: Secondary | ICD-10-CM | POA: Diagnosis not present

## 2023-06-24 DIAGNOSIS — M25562 Pain in left knee: Secondary | ICD-10-CM | POA: Diagnosis not present

## 2023-06-24 DIAGNOSIS — G8929 Other chronic pain: Secondary | ICD-10-CM

## 2023-06-24 DIAGNOSIS — R7302 Impaired glucose tolerance (oral): Secondary | ICD-10-CM | POA: Diagnosis not present

## 2023-06-24 DIAGNOSIS — E669 Obesity, unspecified: Secondary | ICD-10-CM

## 2023-06-24 DIAGNOSIS — I10 Essential (primary) hypertension: Secondary | ICD-10-CM | POA: Diagnosis not present

## 2023-06-24 DIAGNOSIS — Z6836 Body mass index (BMI) 36.0-36.9, adult: Secondary | ICD-10-CM

## 2023-06-24 DIAGNOSIS — M25561 Pain in right knee: Secondary | ICD-10-CM | POA: Diagnosis not present

## 2023-06-24 DIAGNOSIS — E66813 Obesity, class 3: Secondary | ICD-10-CM

## 2023-06-24 MED ORDER — VITAMIN D (ERGOCALCIFEROL) 1.25 MG (50000 UNIT) PO CAPS: 50000.0000 [IU] | ORAL_CAPSULE | ORAL | 0 refills | Status: DC

## 2023-06-24 NOTE — Progress Notes (Addendum)
 WEIGHT SUMMARY AND BIOMETRICS  Vitals Temp: 98.2 F (36.8 C) BP: 90/65 Pulse Rate: 63 SpO2: 92 %   Anthropometric Measurements Height: 5\' 3"  (1.6 m) Weight: 207 lb (93.9 kg) BMI (Calculated): 36.68 Weight at Last Visit: 208 lb Weight Lost Since Last Visit: 1 lb Weight Gained Since Last Visit: 0 Starting Weight: 269 lb Total Weight Loss (lbs): 62 lb (28.1 kg)   Body Composition  Body Fat %: 53.5 % Fat Mass (lbs): 110.4 lbs Muscle Mass (lbs): 91.8 lbs Visceral Fat Rating : 17   Other Clinical Data Fasting: no Labs: no Today's Visit #: 22 Starting Date: 08/21/21    Chief Complaint:   OBESITY Laurie Dominguez is here to discuss her progress with her obesity treatment plan.  She is on the the Category 3 Plan and states she is following her eating plan approximately 85 % of the time.  She states she is exercising NEAT Activities/Chair Exercises 15 minutes daily/2 times per week.   Interim History:  Starting BMI 48 Current BMI 36  She is NOT WEARING supplemental O2 at OV O2 Sat on RA today is 92%  She and her lovely husband both celebrated birthdays March 2025  Hunger/appetite-stable appetite.  PCP manages weekly Ozempic  2mg   Exercise-NEAT Activities and Chair Execises (15 mins 2 x week)  Hydration-Water in the morning and evening.  Sweet tea sipping during the day.  Subjective:   1. Impaired glucose tolerance PCP manages weekly Ozempic  2mg  Denies mass in neck, dysphagia, dyspepsia, persistent hoarseness, abdominal pain, or N/V/C   2. Essential hypertension BP soft at OV She denies sx's of hypotension She is currently on amLODipine  (NORVASC ) 5 MG tablet  losartan -hydrochlorothiazide (HYZAAR) 100-25 MG tablet  rosuvastatin  (CRESTOR ) 5 MG tablet   Home readings: SBP 120-130s DBP: 70-80s  3. Chronic pain of left knee/right knee 05/28/2023 She presents to the office today for chronic knee pain bilaterally.     She has end stage osteoarthritis. Her  last steroid injects were in October 2024 ( 5 months ago). She continues to get benefit from the steroid injection.    She would also like to go up on her Ozempic . She is currently on 1 mg weekly and has found that her weight is stalled and her appetite is increasing.  Assessment & Plan:  1. Chronic pain of right knee (Primary) Discussed risks and benefits of corticosteroid injection and patient consented.  After prepping skin with betadine, injected 80 mg depomedrol and 2 cc of plain xylocaine with 22 gauge one and one half inch needle using anterolateral approach and pt tolerated well.   - methylPREDNISolone  acetate (DEPO-MEDROL ) injection 80 mg   2. Chronic pain of left knee Discussed risks and benefits of corticosteroid injection and patient consented.  After prepping skin with betadine, injected 80 mg depomedrol and 2 cc of plain xylocaine with 22 gauge one and one half inch needle using anterolateral approach and pt tolerated well.   - methylPREDNISolone  acetate (DEPO-MEDROL ) injection 80 mg   4. Vitamin D  deficiency  Latest Reference Range & Units 08/21/21 12:47 01/22/22 13:04 07/09/22 11:54 04/22/23 10:57  Vitamin D , 25-Hydroxy 30.0 - 100.0 ng/mL 10.5 (L) 53.8 53.9 46.2  (L): Data is abnormally low She endorses stable energy levels   Assessment/Plan:   1. Impaired glucose tolerance (Primary) Discuss GLP-1 versus GIP/GLP-1 therapy with PCP  2. Essential hypertension Remain well hydrated with water Continue to check BP at home Monitor for sx's of hypoglycemia  3. Chronic pain  of left knee F/u with PCP and Orthopedics PRN  4. Vitamin D  deficiency Refill - Vitamin D , Ergocalciferol , (DRISDOL ) 1.25 MG (50000 UNIT) CAPS capsule; Take 1 capsule (50,000 Units total) by mouth every 7 (seven) days.  Dispense: 4 capsule; Refill: 0  5. Obesity (BMI 30-39.9), Current BMI 36.7  Laurie Dominguez is currently in the action stage of change. As such, her goal is to continue with weight loss  efforts. She has agreed to the Category 3 Plan.   Exercise goals: Older adults should follow the adult guidelines. When older adults cannot meet the adult guidelines, they should be as physically active as their abilities and conditions will allow.  Older adults should do exercises that maintain or improve balance if they are at risk of falling.  Older adults should determine their level of effort for physical activity relative to their level of fitness.  Older adults with chronic conditions should understand whether and how their conditions affect their ability to do regular physical activity safely.  Behavioral modification strategies: increasing lean protein intake, decreasing simple carbohydrates, increasing vegetables, increasing water intake, no skipping meals, meal planning and cooking strategies, keeping healthy foods in the home, and planning for success.  Shekila has agreed to follow-up with our clinic in 4 weeks. She was informed of the importance of frequent follow-up visits to maximize her success with intensive lifestyle modifications for her multiple health conditions.   Objective:   Blood pressure 90/65, pulse 63, temperature 98.2 F (36.8 C), height 5\' 3"  (1.6 m), weight 207 lb (93.9 kg), SpO2 92%. Body mass index is 36.67 kg/m.  General: Cooperative, alert, well developed, in no acute distress. HEENT: Conjunctivae and lids unremarkable. Cardiovascular: Regular rhythm.  Lungs: Normal work of breathing. Neurologic: No focal deficits.   Lab Results  Component Value Date   CREATININE 0.68 04/22/2023   BUN 17 04/22/2023   NA 145 (H) 04/22/2023   K 3.7 04/22/2023   CL 101 04/22/2023   CO2 29 04/22/2023   Lab Results  Component Value Date   ALT 7 04/22/2023   AST 7 04/22/2023   ALKPHOS 106 04/22/2023   BILITOT 0.3 04/22/2023   Lab Results  Component Value Date   HGBA1C 5.4 04/22/2023   HGBA1C 5.3 11/14/2022   HGBA1C 5.4 07/09/2022   HGBA1C 5.4 01/22/2022    HGBA1C 5.6 08/21/2021   Lab Results  Component Value Date   INSULIN  8.2 04/22/2023   INSULIN  9.2 07/09/2022   INSULIN  11.5 01/22/2022   INSULIN  12.0 08/21/2021   Lab Results  Component Value Date   TSH 3.31 11/14/2022   Lab Results  Component Value Date   CHOL 135 11/14/2022   HDL 36.40 (L) 11/14/2022   LDLCALC 80 11/14/2022   LDLDIRECT 150.5 06/20/2008   TRIG 95.0 11/14/2022   CHOLHDL 4 11/14/2022   Lab Results  Component Value Date   VD25OH 46.2 04/22/2023   VD25OH 53.9 07/09/2022   VD25OH 53.8 01/22/2022   Lab Results  Component Value Date   WBC 8.6 11/14/2022   HGB 12.9 11/14/2022   HCT 40.2 11/14/2022   MCV 92.9 11/14/2022   PLT 300.0 11/14/2022   No results found for: "IRON", "TIBC", "FERRITIN"  Attestation Statements:   Reviewed by clinician on day of visit: allergies, medications, problem list, medical history, surgical history, family history, social history, and previous encounter notes.  I have reviewed the above documentation for accuracy and completeness, and I agree with the above. -  Skylah Delauter d. Daishaun Ayre, NP-C

## 2023-07-28 DIAGNOSIS — L218 Other seborrheic dermatitis: Secondary | ICD-10-CM | POA: Diagnosis not present

## 2023-07-28 DIAGNOSIS — L570 Actinic keratosis: Secondary | ICD-10-CM | POA: Diagnosis not present

## 2023-07-28 DIAGNOSIS — Z85828 Personal history of other malignant neoplasm of skin: Secondary | ICD-10-CM | POA: Diagnosis not present

## 2023-07-28 DIAGNOSIS — L720 Epidermal cyst: Secondary | ICD-10-CM | POA: Diagnosis not present

## 2023-07-28 DIAGNOSIS — L603 Nail dystrophy: Secondary | ICD-10-CM | POA: Diagnosis not present

## 2023-07-28 DIAGNOSIS — L2989 Other pruritus: Secondary | ICD-10-CM | POA: Diagnosis not present

## 2023-08-03 ENCOUNTER — Encounter (INDEPENDENT_AMBULATORY_CARE_PROVIDER_SITE_OTHER): Payer: Self-pay | Admitting: Adult Health

## 2023-08-03 ENCOUNTER — Ambulatory Visit (INDEPENDENT_AMBULATORY_CARE_PROVIDER_SITE_OTHER): Admitting: Adult Health

## 2023-08-03 VITALS — BP 143/82 | HR 89 | Temp 97.9°F | Ht 60.0 in | Wt 207.0 lb

## 2023-08-03 DIAGNOSIS — E559 Vitamin D deficiency, unspecified: Secondary | ICD-10-CM | POA: Diagnosis not present

## 2023-08-03 DIAGNOSIS — E669 Obesity, unspecified: Secondary | ICD-10-CM

## 2023-08-03 DIAGNOSIS — Z6836 Body mass index (BMI) 36.0-36.9, adult: Secondary | ICD-10-CM

## 2023-08-03 DIAGNOSIS — R7302 Impaired glucose tolerance (oral): Secondary | ICD-10-CM

## 2023-08-03 DIAGNOSIS — I10 Essential (primary) hypertension: Secondary | ICD-10-CM | POA: Diagnosis not present

## 2023-08-03 DIAGNOSIS — Z6841 Body Mass Index (BMI) 40.0 and over, adult: Secondary | ICD-10-CM

## 2023-08-03 MED ORDER — VITAMIN D (ERGOCALCIFEROL) 1.25 MG (50000 UNIT) PO CAPS: 50000.0000 [IU] | ORAL_CAPSULE | ORAL | 0 refills | Status: DC

## 2023-08-03 NOTE — Progress Notes (Addendum)
 WEIGHT SUMMARY AND BIOMETRICS  Vitals Temp: 97.9 F (36.6 C) BP: (!) 143/82 Pulse Rate: 89 SpO2: 90 %   Anthropometric Measurements Height: 5' (1.524 m) Weight: 207 lb (93.9 kg) BMI (Calculated): 40.43 Weight at Last Visit: 207 Weight Lost Since Last Visit: 0 Weight Gained Since Last Visit: 0 Starting Weight: 269 lb Total Weight Loss (lbs): 62 lb (28.1 kg)   Body Composition  Body Fat %: 53.8 % Fat Mass (lbs): 111.8 lbs Muscle Mass (lbs): 91.2 lbs Visceral Fat Rating : 17   Other Clinical Data Fasting: no Labs: no Today's Visit #: 23 Starting Date: 08/21/21    Chief Complaint:   OBESITY Laurie Dominguez is here to discuss her progress with her obesity treatment plan.  She is on the the Category 3 Plan and states she is following her eating plan approximately 80 % of the time.  She states she is exercising: NEAT Actitivities  Interim History:  Laurie Dominguez reports recent GI upset, specifically nausea without vomiting and diarrhea. She had one day of abdominal cramping, denied frank abdominal pain. She denies hx of GI upset. Sx's lasted 3 days- 4/26-4/28 Acute sx's resolved  She denies known family of colon cancer Last Colonoscopy 07/2004 with Dr. Willy Harvest  Of note- She is not on supplemental O2 during OV She denies dyspnea  Subjective:   1. Impaired glucose tolerance PCP manages weekly max dose Ozempic  2mg  Denies mass in neck, dysphagia, dyspepsia, persistent hoarseness, abdominal pain, or N/V/C   2. Essential hypertension BP slightly elevated at OV EPIC reviewed demonstrates historically well controlled BP She denies CP with exertion She is not wearing her supplemental O2 today- denies respiratory distress She is on  amLODipine  (NORVASC ) 5 MG tablet  losartan -hydrochlorothiazide (HYZAAR) 100-25 MG tablet  rosuvastatin  (CRESTOR ) 5 MG tablet   5. Vitamin D  deficiency  Latest Reference Range & Units 04/22/23 10:57  Vitamin D , 25-Hydroxy 30.0 - 100.0  ng/mL 46.2   Vit D level stable and just slightly below goal of 50-70 She is on weekly Ergocalciferol - denies N/V/Muscle Weakness   Assessment/Plan:   1. Impaired glucose tolerance (Primary) Continue to limit sugar/simple CHO  2. Essential hypertension Monitor readings Continue amLODipine  (NORVASC ) 5 MG tablet  losartan -hydrochlorothiazide (HYZAAR) 100-25 MG tablet  rosuvastatin  (CRESTOR ) 5 MG tablet   5. Vitamin D  deficiency Refill - Vitamin D , Ergocalciferol , (DRISDOL ) 1.25 MG (50000 UNIT) CAPS capsule; Take 1 capsule (50,000 Units total) by mouth every 7 (seven) days.  Dispense: 4 capsule; Refill: 0  3. Obesity (BMI 30-39.9), CURRENT BMI 36.8  Laurie Dominguez is currently in the action stage of change. As such, her goal is to continue with weight loss efforts. She has agreed to the Category 3 Plan.   Exercise goals: Older adults should follow the adult guidelines. When older adults cannot meet the adult guidelines, they should be as physically active as their abilities and conditions will allow.  Older adults should do exercises that maintain or improve balance if they are at risk of falling.  Older adults should determine their level of effort for physical activity relative to their level of fitness.  Older adults with chronic conditions should understand whether and how their conditions affect their ability to do regular physical activity safely.  Behavioral modification strategies: increasing lean protein intake, decreasing simple carbohydrates, increasing vegetables, increasing water intake, no skipping meals, meal planning and cooking strategies, keeping healthy foods in the home, ways to avoid boredom eating, and planning for success.  Laurie Dominguez has agreed  to follow-up with our clinic in 4 weeks. She was informed of the importance of frequent follow-up visits to maximize her success with intensive lifestyle modifications for her multiple health conditions.   Check Fasting Labs at  next OV  Objective:   Blood pressure (!) 143/82, pulse 89, temperature 97.9 F (36.6 C), height 5' (1.524 m), weight 207 lb (93.9 kg), SpO2 90%. Body mass index is 40.43 kg/m.  General: Cooperative, alert, well developed, in no acute distress. HEENT: Conjunctivae and lids unremarkable. Cardiovascular: Regular rhythm.  Lungs: Normal work of breathing. Neurologic: No focal deficits.   Lab Results  Component Value Date   CREATININE 0.68 04/22/2023   BUN 17 04/22/2023   NA 145 (H) 04/22/2023   K 3.7 04/22/2023   CL 101 04/22/2023   CO2 29 04/22/2023   Lab Results  Component Value Date   ALT 7 04/22/2023   AST 7 04/22/2023   ALKPHOS 106 04/22/2023   BILITOT 0.3 04/22/2023   Lab Results  Component Value Date   HGBA1C 5.4 04/22/2023   HGBA1C 5.3 11/14/2022   HGBA1C 5.4 07/09/2022   HGBA1C 5.4 01/22/2022   HGBA1C 5.6 08/21/2021   Lab Results  Component Value Date   INSULIN  8.2 04/22/2023   INSULIN  9.2 07/09/2022   INSULIN  11.5 01/22/2022   INSULIN  12.0 08/21/2021   Lab Results  Component Value Date   TSH 3.31 11/14/2022   Lab Results  Component Value Date   CHOL 135 11/14/2022   HDL 36.40 (L) 11/14/2022   LDLCALC 80 11/14/2022   LDLDIRECT 150.5 06/20/2008   TRIG 95.0 11/14/2022   CHOLHDL 4 11/14/2022   Lab Results  Component Value Date   VD25OH 46.2 04/22/2023   VD25OH 53.9 07/09/2022   VD25OH 53.8 01/22/2022   Lab Results  Component Value Date   WBC 8.6 11/14/2022   HGB 12.9 11/14/2022   HCT 40.2 11/14/2022   MCV 92.9 11/14/2022   PLT 300.0 11/14/2022   No results found for: "IRON", "TIBC", "FERRITIN"  Attestation Statements:   Reviewed by clinician on day of visit: allergies, medications, problem list, medical history, surgical history, family history, social history, and previous encounter notes.  I have reviewed the above documentation for accuracy and completeness, and I agree with the above. -  Shonice Wrisley d. Atianna Haidar, NP-C

## 2023-08-05 ENCOUNTER — Telehealth: Payer: Self-pay

## 2023-08-05 NOTE — Progress Notes (Signed)
   08/05/2023  Patient ID: Laurie Dominguez, female   DOB: 20-Oct-1950, 73 y.o.   MRN: [id number]  Patient's ozempic  dose was increased to 2mg  at recent PCP visit. Submitted dose increase to [facility].

## 2023-08-06 ENCOUNTER — Telehealth: Payer: Self-pay

## 2023-08-06 NOTE — Telephone Encounter (Signed)
 Pt notified that pt assistant meds is ready for pick up.

## 2023-08-25 ENCOUNTER — Encounter: Payer: Self-pay | Admitting: Family Medicine

## 2023-08-25 ENCOUNTER — Ambulatory Visit (INDEPENDENT_AMBULATORY_CARE_PROVIDER_SITE_OTHER): Admitting: Family Medicine

## 2023-08-25 VITALS — BP 136/80

## 2023-08-25 DIAGNOSIS — Z0000 Encounter for general adult medical examination without abnormal findings: Secondary | ICD-10-CM | POA: Diagnosis not present

## 2023-08-25 NOTE — Patient Instructions (Signed)
 I really enjoyed getting to talk with you today! I am available on Tuesdays and Thursdays for virtual visits if you have any questions or concerns, or if I can be of any further assistance.   CHECKLIST FROM ANNUAL WELLNESS VISIT:  -Follow up (please call to schedule if not scheduled after visit):   -yearly for annual wellness visit with primary care office  Here is a list of your preventive care/health maintenance measures and the plan for each if any are due:  PLAN For any measures below that may be due:    1. I placed a referral for the mammogram. Please call if you have not been contacted  in the next 1-2 weeks.    2. Please schedule annual eye exam and send copy of the report to [facility].   3. Can get vaccines at the pharmacy. If you do, please send copy of receipt to our office.   Health Maintenance  Topic Date Due   OPHTHALMOLOGY EXAM  Never done   COVID-19 Vaccine (6 - 2024-25 season) 11/23/2022   MAMMOGRAM  07/26/2023   Diabetic kidney evaluation - Urine ACR  08/13/2027 (Originally 05/31/1968)   HEMOGLOBIN A1C  10/20/2023   INFLUENZA VACCINE  10/23/2023   Diabetic kidney evaluation - eGFR measurement  04/21/2024   Medicare Annual Wellness (AWV)  08/24/2024   Fecal DNA (Cologuard)  11/20/2024   DTaP/Tdap/Td (3 - Td or Tdap) 11/13/2031   Pneumonia Vaccine 39+ Years old  Completed   DEXA SCAN  Completed   Hepatitis C Screening  Completed   Zoster Vaccines- Shingrix  Completed   HPV VACCINES  Aged Out   Meningococcal B Vaccine  Aged Out   FOOT EXAM  Discontinued    -See a dentist at least yearly  -Get your eyes checked and then per your eye specialist's recommendations  -Other issues addressed today:   -I have included below further information regarding a healthy whole foods based diet, physical activity guidelines for adults, stress management and opportunities for social connections. I hope you find this information useful.    -----------------------------------------------------------------------------------------------------------------------------------------------------------------------------------------------------------------------------------------------------------    NUTRITION: -eat real food: lots of colorful vegetables (half the plate) and fruits -5-7 servings of vegetables and fruits per day (fresh or steamed is best), exp. 2 servings of vegetables with lunch and dinner and 2 servings of fruit per day. Berries and greens such as kale and collards are great choices.  -consume on a regular basis:  fresh fruits, fresh veggies, fish, nuts, seeds, healthy oils (such as olive oil, avocado oil), whole grains (make sure for bread/pasta/crackers/etc., that the first ingredient on label contains the word "whole"), legumes. -can eat small amounts of dairy and lean meat (no larger than the palm of your hand), but avoid processed meats such as ham, bacon, lunch meat, etc. -drink water -try to avoid fast food and pre-packaged foods, processed meat, ultra processed foods/beverages (donuts, candy, etc.) -most experts advise limiting sodium to < 2300mg  per day, should limit further is any chronic conditions such as high blood pressure, heart disease, diabetes, etc. The American Heart Association advised that < 1500mg  is is ideal -try to avoid foods/beverages that contain any ingredients with names you do not recognize  -try to avoid foods/beverages  with added sugar or sweeteners/sweets  -try to avoid sweet drinks (including diet drinks): soda, juice, Gatorade, sweet tea, power drinks, diet drinks -try to avoid white rice, white bread, pasta (unless whole grain)  EXERCISE GUIDELINES FOR ADULTS: -if you wish to increase  your physical activity, do so gradually and with the approval of your doctor -STOP and seek medical care immediately if you have any chest pain, chest discomfort or trouble breathing when starting or  increasing exercise  -move and stretch your body, legs, feet and arms when sitting for long periods -Physical activity guidelines for optimal health in adults: -get at least 150 minutes per week of moderate exercise (can talk, but not sing); this is about 20-30 minutes of sustained activity 5-7 days per week or two 10-15 minute episodes of sustained activity 5-7 days per week -do some muscle building/resistance training/strength training at least 2 days per week  -balance exercises 3+ days per week:   Stand somewhere where you have something sturdy to hold onto if you lose balance    1) lift up on toes, then back down, start with 5x per day and work up to 20x   2) stand and lift one leg straight out to the side so that foot is a few inches of the floor, start with 5x each side and work up to 20x each side   3) stand on one foot, start with 5 seconds each side and work up to 20 seconds on each side  If you need ideas or help with getting more active:  -Silver sneakers https://tools.silversneakers.com  -Walk with a Doc: http://www.duncan-williams.com/  -try to include resistance (weight lifting/strength building) and balance exercises twice per week: or the following link for ideas: http://castillo-powell.com/  BuyDucts.dk  STRESS MANAGEMENT: -can try meditating, or just sitting quietly with deep breathing while intentionally relaxing all parts of your body for 5 minutes daily -if you need further help with stress, anxiety or depression please follow up with your primary doctor or contact the wonderful folks at [facility]: [phone]  SOCIAL CONNECTIONS: -options in [place] if you wish to engage in more social and exercise related activities:  -Silver sneakers https://tools.silversneakers.com  -Walk with a Doc: http://www.duncan-williams.com/  -Check out the [facility] Active Adults 50+  section on the [place] [employer] (hiking clubs, book clubs, cards and games, chess, exercise classes, aquatic classes and much more) - see the website for details: https://www.[place].gov/departments/parks-recreation/active-adults50  -YouTube has lots of exercise videos for different ages and abilities as well  -[facility] (a variety of indoor and outdoor inperson activities for adults). 4016889679. [address].  -Virtual Online Classes (a variety of topics): see seniorplanet.org or call [phone]  -consider volunteering at a school, hospice center, church, senior center or elsewhere

## 2023-08-25 NOTE — Progress Notes (Signed)
 PATIENT CHECK-IN and HEALTH RISK ASSESSMENT QUESTIONNAIRE:  -completed by phone/video for upcoming Medicare Preventive Visit  Pre-Visit Check-in: 1)Vitals (height, wt, BP, etc) - record in vitals section for visit on day of visit Request home vitals (wt, BP, etc.) and enter into vitals, THEN update Vital Signs SmartPhrase below at the top of the HPI. See below.  2)Review and Update Medications, Allergies PMH, Surgeries, Social history in Epic 3)Hospitalizations in the last year with date/reason? NO   4)Review and Update Care Team (patient's specialists) in Epic 5) Complete PHQ9 in Epic  6) Complete Fall Screening in Epic 7)Review all Health Maintenance Due and order under PCP if not done.  Medicare Wellness Patient Questionnaire:  Answer theses question about your habits: How often do you have a drink containing alcohol?No How many drinks containing alcohol do you have on a typical day when you are drinking?No  How often do you have six or more drinks on one occasion?Na  Have you ever smoked?Yes  Quit date if applicable? Over 20 years ago   How many packs a day do/did you smoke? 2 packs a day  Do you use smokeless tobacco?No  Do you use an illicit drugs?No  On average, how many days per week do you engage in moderate to strenuous exercise (like a brisk walk)?No  On average, how many minutes do you engage in exercise at this level?Na  Are you sexually active? No Number of partners?Na  Typical breakfast:  Fruit, toast, or BLT  Typical lunch:Cheese and crackers, apples, [place] wraps  Typical dinner:Grilled chicken, grilled steak, Burger, salad, vegetables  Typical snacks:Apple, popcorn, chicken   Beverages: Water, iced tea and lemonade mix   Answer theses question about your everyday activities: Can you perform most household chores?No, can't vacuum or clean the floor  Are you deaf or have significant trouble hearing?yes, doing ok Do you feel that you have a problem with memory?NO   Do you feel safe at home?Yes  Last dentist visit? Last year  8. Do you have any difficulty performing your everyday activities?Yes, patient uses a walker  Are you having any difficulty walking, taking medications on your own, and or difficulty managing daily home needs?Yes  Do you have difficulty walking or climbing stairs?Yes  Do you have difficulty dressing or bathing?NO  Do you have difficulty doing errands alone such as visiting a doctor's office or shopping?Yes  Do you currently have any difficulty preparing food and eating?No  Do you currently have any difficulty using the toilet?NO  Do you have any difficulty managing your finances?No  Do you have any difficulties with housekeeping of managing your housekeeping?Yes, uses a walker    Do you have Advanced Directives in place (Living Will, Healthcare Power or Attorney)? yes   Last eye Exam and location? Last year, Dr. Lenna Quinton - reports had eye exam in the summer last year   Do you currently use prescribed or non-prescribed narcotic or opioid pain medications?Yes, Tramadol    Do you have a history or close family history of breast, ovarian, tubal or peritoneal cancer or a family member with BRCA (breast cancer susceptibility 1 and 2) gene mutations? No   Nurse/Assistant Credentials/time stamp: Leah A.Wright CMA 12:41 PM     ----------------------------------------------------------------------------------------------------------------------------------------------------------------------------------------------------------------------  Because this visit was a virtual/telehealth visit, some criteria may be missing or patient reported. Any vitals not documented were not able to be obtained and vitals that have been documented are patient reported.    MEDICARE ANNUAL PREVENTIVE VISIT WITH PROVIDER: (  Welcome to [employer], initial annual wellness or annual wellness exam)  Virtual Visit via Phone Note  I connected with Nancye Azure on 08/25/23 by phone and verified that I am speaking with the correct person using two identifiers. Prefers a phone visit.   Location patient: home Location provider:work or home office Persons participating in the virtual visit: patient, provider  Concerns and/or follow up today: reports "everything is great." She wears oxygen  and feels has been doing well. On ozempic  and has been happy with results.    See HM section in Epic for other details of completed HM.    ROS: negative for report of fevers, unintentional weight loss, vision changes, vision loss, hearing loss or change, chest pain, sob, hemoptysis, melena, hematochezia, hematuria, falls, bleeding or bruising, thoughts of suicide or self harm, memory loss  Patient-completed extensive health risk assessment - reviewed and discussed with the patient: See Health Risk Assessment completed with patient prior to the visit either above or in recent phone note. This was reviewed in detailed with the patient today and appropriate recommendations, orders and referrals were placed as needed per Summary below and patient instructions.   Review of Medical History: -PMH, PSH, Family History and current specialty and care providers reviewed and updated and listed below   Patient Care Team: Alto Atta, NP as PCP - General (Family Medicine) Hugh Madura, MD as PCP - Cardiology (Cardiology) Moira Andrews, MD as Consulting Physician (Dermatology) Carnell Christian, [facility] (Pharmacist) Princella Brooklyn, OD (Optometry)   Past Medical History:  Diagnosis Date   Colon polyp 08/16/2004   Hyperplastic   COPD (chronic obstructive pulmonary disease) (HCC)    Depression    GERD (gastroesophageal reflux disease)    High cholesterol    Hypertension    Joint pain    Nephrolithiasis    Obesity    SOB (shortness of breath)    Swelling of lower extremity     Past Surgical History:  Procedure Laterality Date   BREAST BIOPSY Left 12/2017    CHOLECYSTECTOMY     COLONOSCOPY  2006   DILATION AND CURETTAGE OF UTERUS     MOHS SURGERY     TONSILLECTOMY      Social History   Socioeconomic History   Marital status: Married    Spouse name: FW   Number of children: Not on file   Years of education: Not on file   Highest education level: Bachelor's degree (e.g., BA, AB, BS)  Occupational History   Occupation: Retired  Tobacco Use   Smoking status: Former    Current packs/day: 0.00    Average packs/day: 2.0 packs/day for 40.0 years (80.0 ttl pk-yrs)    Types: Cigarettes    Start date: 03/24/1966    Quit date: 03/24/2006    Years since quitting: 17.4   Smokeless tobacco: Never  Vaping Use   Vaping status: Never Used  Substance and Sexual Activity   Alcohol use: No    Alcohol/week: 0.0 standard drinks of alcohol   Drug use: No   Sexual activity: Not on file  Other Topics Concern   Not on file  Social History Narrative   Not on file   Social Drivers of Health   Financial Resource Strain: Low Risk  (02/12/2023)   Overall Financial Resource Strain (CARDIA)    Difficulty of Paying Living Expenses: Not very hard  Food Insecurity: No Food Insecurity (02/12/2023)   Hunger Vital Sign    Worried About Running Out of  Food in the Last Year: Never true    Ran Out of Food in the Last Year: Never true  Transportation Needs: No Transportation Needs (02/12/2023)   PRAPARE - Administrator, Civil Service (Medical): No    Lack of Transportation (Non-Medical): No  Physical Activity: Inactive (02/12/2023)   Exercise Vital Sign    Days of Exercise per Week: 0 days    Minutes of Exercise per Session: 0 min  Stress: No Stress Concern Present (02/12/2023)   [employer] of Occupational Health - Occupational Stress Questionnaire    Feeling of Stress : Only a little  Social Connections: Moderately Integrated (02/12/2023)   Social Connection and Isolation Panel [NHANES]    Frequency of Communication with Friends and  Family: Twice a week    Frequency of Social Gatherings with Friends and Family: Twice a week    Attends Religious Services: More than 4 times per year    Active Member of [employer] or Organizations: No    Attends Banker Meetings: Never    Marital Status: Married  Catering manager Violence: Unknown (02/12/2023)   Humiliation, Afraid, Rape, and Kick questionnaire    Fear of Current or Ex-Partner: No    Emotionally Abused: No    Physically Abused: No    Sexually Abused: Not on file    Family History  Problem Relation Age of Onset   Cancer Mother        unknown primary   Liver cancer Mother        unknown primary   Other Father        spinal cord tumor   Cancer Father    Alcoholism Father    Breast cancer Neg Hx     Current Outpatient Medications on File Prior to Visit  Medication Sig Dispense Refill   albuterol  (VENTOLIN  HFA) 108 (90 Base) MCG/ACT inhaler USE 2 INHALATIONS BY MOUTH  EVERY 6 HOURS AS NEEDED FOR WHEEZING OR SHORTNESS OF  BREATH 34 g 3   amLODipine  (NORVASC ) 5 MG tablet TAKE 1 TABLET BY MOUTH DAILY 100 tablet 2   budesonide -formoterol  (SYMBICORT ) 160-4.5 MCG/ACT inhaler Inhale 2 puffs into the lungs daily. 30.6 g 0   diazepam  (VALIUM ) 2 MG tablet Take 1 tablet (2 mg total) by mouth at bedtime as needed for anxiety or muscle spasms. 30 tablet 0   Fluocinolone Acetonide 0.01 % OIL SMARTSIG:5 Drop(s) In Ear(s) Twice Daily PRN     losartan -hydrochlorothiazide (HYZAAR) 100-25 MG tablet TAKE 1 TABLET BY MOUTH DAILY 100 tablet 3   meclizine  (ANTIVERT ) 25 MG tablet Take 1 tablet (25 mg total) by mouth daily as needed. 90 tablet 1   omeprazole  (PRILOSEC) 20 MG capsule TAKE 1 CAPSULE BY MOUTH DAILY 100 capsule 2   PARoxetine  (PAXIL ) 30 MG tablet TAKE 2 TABLETS BY MOUTH IN  THE MORNING (Patient taking differently: Take 30 mg by mouth daily.) 180 tablet 3   rosuvastatin  (CRESTOR ) 5 MG tablet TAKE 1 TABLET BY MOUTH EVERY  OTHER DAY 50 tablet 2   Semaglutide , 2 MG/DOSE,  8 MG/3ML SOPN Inject 2 mg as directed once a week. 9 mL 1   traMADol  (ULTRAM ) 50 MG tablet Take 1 tablet (50 mg total) by mouth every 8 (eight) hours as needed for moderate pain. 60 tablet 0   triamcinolone  (KENALOG ) 0.025 % cream Apply 1 application topically 2 (two) times daily.     triamcinolone  (NASACORT ) 55 MCG/ACT AERO nasal inhaler Place 2 sprays into the nose daily. (  Patient taking differently: Place 2 sprays into the nose as needed.) 32.4 mL 3   Vitamin D , Ergocalciferol , (DRISDOL ) 1.25 MG (50000 UNIT) CAPS capsule Take 1 capsule (50,000 Units total) by mouth every 7 (seven) days. 4 capsule 0   No current facility-administered medications on file prior to visit.    Allergies  Allergen Reactions   Codeine Phosphate    Fosamax  [Alendronate ] Other (See Comments)    Muscle aches   Simvastatin      Myalgia        Physical Exam Vitals requested from patient and listed below if patient had equipment and was able to obtain at home for this virtual visit: Vitals:   08/25/23 1226  BP: 136/80   Estimated body mass index is 40.43 kg/m as calculated from the following:   Height as of 08/03/23: 5' (1.524 m).   Weight as of 08/03/23: 207 lb (93.9 kg).  EKG (optional): deferred due to virtual visit  GENERAL: alert, oriented, no acute distress detected, full vision exam deferred due to pandemic and/or virtual encounter  PSYCH/NEURO: pleasant and cooperative, no obvious depression or anxiety, speech and thought processing grossly intact, Cognitive function grossly intact  Flowsheet Row Clinical Support from 08/25/2023 in [facility] [place]  PHQ-9 Total Score 0           08/25/2023   12:28 PM 02/12/2023   12:55 PM 10/01/2022    3:26 PM 08/13/2022    9:22 AM 02/28/2022   11:48 AM  Depression screen PHQ 2/9  Decreased Interest 0 0 0 1 0  Down, Depressed, Hopeless 0 0 0 1 0  PHQ - 2 Score 0 0 0 2 0  Altered sleeping 0  0 1   Tired, decreased energy 0  0 1    Change in appetite 0  0 1   Feeling bad or failure about yourself  0  0 1   Trouble concentrating 0  0 0   Moving slowly or fidgety/restless 0  0 0   Suicidal thoughts 0  0 0   PHQ-9 Score 0  0 6   Difficult doing work/chores   Not difficult at all Somewhat difficult        08/13/2022    9:22 AM 10/01/2022    3:25 PM 11/12/2022   11:36 AM 02/12/2023   12:57 PM 08/25/2023   12:28 PM  Fall Risk  Falls in the past year? 0 0 0 0 0  Was there an injury with Fall? 0 0  0 0  Fall Risk Category Calculator 0 0  0 0  Patient at Risk for Falls Due to No Fall Risks No Fall Risks  Medication side effect;Impaired balance/gait No Fall Risks  Fall risk Follow up Falls evaluation completed Falls evaluation completed  Falls prevention discussed Falls evaluation completed     SUMMARY AND PLAN:  Encounter for Medicare annual wellness exam - Plan: MM 3D SCREENING MAMMOGRAM BILATERAL BREAST  Discussed applicable health maintenance/preventive health measures and advised and referred or ordered per patient preferences: -discussed breast cancer screening and placed order for mammogram  -discussed eye exam and she says she will have eye doc send report when she does her exam -discussed covid vaccine recs and she knows can get at the pharmacy  Health Maintenance  Topic Date Due   OPHTHALMOLOGY EXAM  Never done   COVID-19 Vaccine (6 - 2024-25 season) 11/23/2022   MAMMOGRAM  07/26/2023   Diabetic kidney evaluation - Urine ACR  08/13/2027 (Originally 05/31/1968)   HEMOGLOBIN A1C  10/20/2023   INFLUENZA VACCINE  10/23/2023   Diabetic kidney evaluation - eGFR measurement  04/21/2024   Medicare Annual Wellness (AWV)  08/24/2024   Fecal DNA (Cologuard)  11/20/2024   DTaP/Tdap/Td (3 - Td or Tdap) 11/13/2031   Pneumonia Vaccine 41+ Years old  Completed   DEXA SCAN  Completed   Hepatitis C Screening  Completed   Zoster Vaccines- Shingrix  Completed   HPV VACCINES  Aged Out   Meningococcal B Vaccine  Aged  Out   FOOT EXAM  Discontinued      Education and counseling on the following was provided based on the above review of health and a plan/checklist for the patient, along with additional information discussed, was provided for the patient in the patient instructions :  -Provided counseling and plan for difficulty hearing  -Provided safe balance exercises that can be done at home to improve balance and discussed exercise guidelines for adults with include balance exercises at least 3 days per week.  -Advised and counseled on a healthy lifestyle - including the importance of a healthy diet, regular physical activity, social connections and stress management. -Reviewed patient's current diet. Advised and counseled on a whole foods based healthy diet. A summary of a healthy diet was provided in the Patient Instructions.  -reviewed patient's current physical activity level and discussed exercise guidelines for adults. Discussed community resources and ideas for safe exercise at home to assist in meeting exercise guideline recommendations in a safe and healthy way. Discussed ways to gradually add safe exercise and she agrees to try starting with 5 minutes of walking in place daily and build from there.  -Advise yearly dental visits at minimum and regular eye exams   Follow up: see patient instructions     Patient Instructions  I really enjoyed getting to talk with you today! I am available on Tuesdays and Thursdays for virtual visits if you have any questions or concerns, or if I can be of any further assistance.   CHECKLIST FROM ANNUAL WELLNESS VISIT:  -Follow up (please call to schedule if not scheduled after visit):   -yearly for annual wellness visit with primary care office  Here is a list of your preventive care/health maintenance measures and the plan for each if any are due:  PLAN For any measures below that may be due:    1. I placed a referral for the mammogram. Please call if you  have not been contacted  in the next 1-2 weeks.    2. Please schedule annual eye exam and send copy of the report to [facility].   3. Can get vaccines at the pharmacy. If you do, please send copy of receipt to our office.   Health Maintenance  Topic Date Due   OPHTHALMOLOGY EXAM  Never done   COVID-19 Vaccine (6 - 2024-25 season) 11/23/2022   MAMMOGRAM  07/26/2023   Diabetic kidney evaluation - Urine ACR  08/13/2027 (Originally 05/31/1968)   HEMOGLOBIN A1C  10/20/2023   INFLUENZA VACCINE  10/23/2023   Diabetic kidney evaluation - eGFR measurement  04/21/2024   Medicare Annual Wellness (AWV)  08/24/2024   Fecal DNA (Cologuard)  11/20/2024   DTaP/Tdap/Td (3 - Td or Tdap) 11/13/2031   Pneumonia Vaccine 39+ Years old  Completed   DEXA SCAN  Completed   Hepatitis C Screening  Completed   Zoster Vaccines- Shingrix  Completed   HPV VACCINES  Aged Out   Meningococcal B  Vaccine  Aged Out   FOOT EXAM  Discontinued    -See a dentist at least yearly  -Get your eyes checked and then per your eye specialist's recommendations  -Other issues addressed today:   -I have included below further information regarding a healthy whole foods based diet, physical activity guidelines for adults, stress management and opportunities for social connections. I hope you find this information useful.   -----------------------------------------------------------------------------------------------------------------------------------------------------------------------------------------------------------------------------------------------------------    NUTRITION: -eat real food: lots of colorful vegetables (half the plate) and fruits -5-7 servings of vegetables and fruits per day (fresh or steamed is best), exp. 2 servings of vegetables with lunch and dinner and 2 servings of fruit per day. Berries and greens such as kale and collards are great choices.  -consume on a regular basis:  fresh fruits, fresh veggies,  fish, nuts, seeds, healthy oils (such as olive oil, avocado oil), whole grains (make sure for bread/pasta/crackers/etc., that the first ingredient on label contains the word "whole"), legumes. -can eat small amounts of dairy and lean meat (no larger than the palm of your hand), but avoid processed meats such as ham, bacon, lunch meat, etc. -drink water -try to avoid fast food and pre-packaged foods, processed meat, ultra processed foods/beverages (donuts, candy, etc.) -most experts advise limiting sodium to < 2300mg  per day, should limit further is any chronic conditions such as high blood pressure, heart disease, diabetes, etc. The American Heart Association advised that < 1500mg  is is ideal -try to avoid foods/beverages that contain any ingredients with names you do not recognize  -try to avoid foods/beverages  with added sugar or sweeteners/sweets  -try to avoid sweet drinks (including diet drinks): soda, juice, Gatorade, sweet tea, power drinks, diet drinks -try to avoid white rice, white bread, pasta (unless whole grain)  EXERCISE GUIDELINES FOR ADULTS: -if you wish to increase your physical activity, do so gradually and with the approval of your doctor -STOP and seek medical care immediately if you have any chest pain, chest discomfort or trouble breathing when starting or increasing exercise  -move and stretch your body, legs, feet and arms when sitting for long periods -Physical activity guidelines for optimal health in adults: -get at least 150 minutes per week of moderate exercise (can talk, but not sing); this is about 20-30 minutes of sustained activity 5-7 days per week or two 10-15 minute episodes of sustained activity 5-7 days per week -do some muscle building/resistance training/strength training at least 2 days per week  -balance exercises 3+ days per week:   Stand somewhere where you have something sturdy to hold onto if you lose balance    1) lift up on toes, then back down,  start with 5x per day and work up to 20x   2) stand and lift one leg straight out to the side so that foot is a few inches of the floor, start with 5x each side and work up to 20x each side   3) stand on one foot, start with 5 seconds each side and work up to 20 seconds on each side  If you need ideas or help with getting more active:  -Silver sneakers https://tools.silversneakers.com  -Walk with a Doc: http://www.duncan-williams.com/  -try to include resistance (weight lifting/strength building) and balance exercises twice per week: or the following link for ideas: http://castillo-powell.com/  BuyDucts.dk  STRESS MANAGEMENT: -can try meditating, or just sitting quietly with deep breathing while intentionally relaxing all parts of your body for 5 minutes daily -if you need further  help with stress, anxiety or depression please follow up with your primary doctor or contact the wonderful folks at [facility]: [phone]  SOCIAL CONNECTIONS: -options in [place] if you wish to engage in more social and exercise related activities:  -Silver sneakers https://tools.silversneakers.com  -Walk with a Doc: http://www.duncan-williams.com/  -Check out the [facility] Active Adults 50+ section on the [place] of [employer] (hiking clubs, book clubs, cards and games, chess, exercise classes, aquatic classes and much more) - see the website for details: https://www.[place]-.gov/departments/parks-recreation/active-adults50  -YouTube has lots of exercise videos for different ages and abilities as well  -[facility] (a variety of indoor and outdoor inperson activities for adults). ([phone]) [phone]. [address].  -Virtual Online Classes (a variety of topics): see seniorplanet.org or call [phone]  -consider volunteering at a school, hospice center, church, senior center or  elsewhere            Maurie Southern, DO

## 2023-09-11 ENCOUNTER — Ambulatory Visit

## 2023-09-14 ENCOUNTER — Ambulatory Visit
Admission: RE | Admit: 2023-09-14 | Discharge: 2023-09-14 | Disposition: A | Discharge status: Home / Self Care | Source: Ambulatory Visit | Attending: Family Medicine | Admitting: Family Medicine

## 2023-09-14 DIAGNOSIS — Z0000 Encounter for general adult medical examination without abnormal findings: Secondary | ICD-10-CM

## 2023-09-14 DIAGNOSIS — Z1231 Encounter for screening mammogram for malignant neoplasm of breast: Secondary | ICD-10-CM | POA: Diagnosis not present

## 2023-09-14 NOTE — Progress Notes (Unsigned)
 SUBJECTIVE: Discussed the use of AI scribe software for clinical note transcription with the patient, who gave verbal consent to proceed.  Chief Complaint: Obesity  Interim History: She is down 1 lb since last visit.  Down 63 lbs overall TBW loss of 23.4%  Laurie Dominguez is here to discuss her progress with her obesity treatment plan. She is on the Category 3 Plan and states she is following her eating plan approximately 80 % of the time. She states she is exercising 10 minutes 3 times per week.  Laurie Dominguez is a 73 year old female who presents for follow-up of her obesity treatment plan.  She has achieved significant weight loss, losing 63 pounds recently, which is 23% of her body weight, and a total of 100 pounds overall from her peak weight. Her mobility has improved with knee injections, though she still requires them occasionally. She uses a rolling walker for stability and mobility around the house. She is conscious of her diet, focusing on protein intake and monitoring sugar and salt consumption, especially after noticing water weight gain following the consumption of soy sauce.  She has a history of type 2 diabetes and impaired glucose tolerance. No current symptoms related to these conditions are reported.  She has chronic respiratory failure with hypoxia and uses supplemental oxygen . She requires oxygen  during the daytime, especially in the heat, but can manage without it in cooler, air-conditioned environments.  Her past medical history includes vitamin D  deficiency, deep vein thrombosis (DVT), anxiety, and depression. No current symptoms related to DVT, anxiety, or depression are reported. She denies any nausea, vomiting, or muscle weakness associated with her vitamin D  supplementation.  Presents using scooter per usual today, but not needing oxygen  therapy while in the air conditioned office setting today.   Fasting labs were obtained today The patient was informed we  would discuss the lab results at the next visit unless there is a critical issue that needs to be addressed sooner. The patient agreed to keep the next visit at the agreed upon time to discuss these results.   Had mammogram 09/14/23- results pending- routine screening .   AWV with PCP in August  OBJECTIVE: Visit Diagnoses: Problem List Items Addressed This Visit     Essential hypertension   Impaired glucose tolerance - Primary   Relevant Orders   CMP14+EGFR   Hemoglobin A1c   Insulin , random   Chronic respiratory failure with hypoxia (HCC)   Pure hypercholesterolemia   Relevant Orders   Lipid Panel With LDL/HDL Ratio   Vitamin D  deficiency   Relevant Medications   Vitamin D , Ergocalciferol , (DRISDOL ) 1.25 MG (50000 UNIT) CAPS capsule   Other Relevant Orders   VITAMIN D  25 Hydroxy (Vit-D Deficiency, Fractures)   Other Visit Diagnoses       Other fatigue       Relevant Orders   Vitamin B12   CBC with Differential/Platelet   TSH     Class 3 severe obesity with serious comorbidity and body mass index (BMI) of 45.0 to 49.9 in adult, unspecified obesity type (HCC), STARTING BMI 47.66         BMI 40.0-44.9, adult (HCC) Current BMI 40.4         Obesity Laurie Dominguez has achieved a 23% reduction in body weight, losing 63 pounds since starting with HWW, contributing to improved mobility and glucose control. She remains committed to her weight loss goals, monitoring dietary sugar intake and adjusting physical activity during the summer months.  She reports she is down 100+ lbs from her peak weight at this point.  Feels she is consistently meeting protein goals and fluid intake goals.  Reports no excessive hunger and rare cravings for salty foods.  - Continue current weight loss plan - Monitor sugar intake, especially in yogurt and sodium in soy sauce - Encourage increased physical activity, such as walking around the house  Impaired glucose tolerance Her significant weight loss is  likely improving glucose control. Hemoglobin A1c and insulin  levels will be assessed to evaluate glucose management and insulin  resistance. Lab Results  Component Value Date   HGBA1C 5.4 04/22/2023   HGBA1C 5.3 11/14/2022   HGBA1C 5.4 07/09/2022   Lab Results  Component Value Date   LDLCALC 80 11/14/2022   CREATININE 0.68 04/22/2023   INSULIN   Date Value Ref Range Status  04/22/2023 8.2 2.6 - 24.9 uIU/mL Final  ]Continue working on nutrition plan to decrease simple carbohydrates, increase lean proteins and exercise to promote weight loss, improve glycemic control and prevent progression to Type 2 diabetes.  - Order hemoglobin A1c and insulin  level tests along with CMET and lipids today  Chronic Respiratory Failure with Hypoxia She continues to use supplemental oxygen , particularly in hot weather, but manages without it in cooler environments. On no O2 during office visit today and no SOB with general conversation. No increase WOB.  - Continue use of supplemental oxygen  as needed, especially in hot weather Continue regular follow up with Providers  Vitamin D  Deficiency She is on vitamin D  supplementation - Ergocalciferol  50,000 units once weekly. Takes Ergocalciferol  without side effects such as nausea, vomiting, or muscle weakness. Last vitamin D  Lab Results  Component Value Date   VD25OH 46.2 04/22/2023   Low vitamin D  levels can be associated with adiposity and may result in leptin resistance and weight gain. Also associated with fatigue.  Currently on vitamin D  supplementation without any adverse effects such as nausea, vomiting or muscle weakness.  - Refill vitamin D  prescription at [facility] - Order vitamin D  level test Meds ordered this encounter  Medications   Vitamin D , Ergocalciferol , (DRISDOL ) 1.25 MG (50000 UNIT) CAPS capsule    Sig: Take 1 capsule (50,000 Units total) by mouth every 7 (seven) days.    Dispense:  4 capsule    Refill:  0     Hyperlipidemia LDL is at goal. Medication(s): Crestor  5 mg every other day. No reported SE.  Cardiovascular risk factors: advanced age (older than 75 for men, 56 for women), dyslipidemia, hypertension, obesity (BMI >= 30 kg/m2), and sedentary lifestyle  Lab Results  Component Value Date   CHOL 135 11/14/2022   HDL 36.40 (L) 11/14/2022   LDLCALC 80 11/14/2022   LDLDIRECT 150.5 06/20/2008   TRIG 95.0 11/14/2022   CHOLHDL 4 11/14/2022   CHOLHDL 4 04/17/2021   CHOLHDL 4 11/08/2020   Lab Results  Component Value Date   ALT 7 04/22/2023   AST 7 04/22/2023   ALKPHOS 106 04/22/2023   BILITOT 0.3 04/22/2023   The 10-year ASCVD risk score (Arnett DK, et al., 2019) is: 28.3%   Values used to calculate the score:     Age: 31 years     Clincally relevant sex: Female     Is Non-Hispanic African American: No     Diabetic: Yes     Tobacco smoker: No     Systolic Blood Pressure: 118 mmHg     Is BP treated: Yes     HDL  Cholesterol: 36.4 mg/dL     Total Cholesterol: 135 mg/dL  Plan: Continue Crestor  5 mg every other day.  Recheck fasting lipids today  Continue to work on nutrition plan -decreasing simple carbohydrates, increasing lean proteins, decreasing saturated fats and cholesterol , avoiding trans fats and exercise as able to promote weight loss, improve lipids and decrease cardiovascular risks.  Hypertension Hypertension reasonably well controlled, no significant medication side effects noted, and needs further observation.  Medication(s): losartan  - hydrochlorothiazide 100-25 mg once daily.  Amlodipine  5 mg daily  BP Readings from Last 3 Encounters:  09/15/23 118/75  08/25/23 136/80  08/03/23 (!) 143/82   Lab Results  Component Value Date   CREATININE 0.68 04/22/2023   CREATININE 0.63 11/14/2022   CREATININE 0.61 07/09/2022   Lab Results  Component Value Date   GFR 87.98 11/14/2022   GFR 87.00 11/08/2020   GFR 90.27 10/26/2018    Plan: Continue all  antihypertensives at current dosages. BP better this visit after allowing time to settle during appointment.  Continue to work on nutrition plan to promote weight loss and improve BP control.  Recheck renal, etc today  Other fatigue Likely multi-factorial .  She is advised to maintain hydration, especially in hot weather, and monitor salt intake to manage fluid retention. - Order complete blood count, lipid panel, B12, and thyroid  function tests - Encourage adequate hydration and monitor fluid intake - Continue monitoring blood pressure at home - Monitor salt intake to manage fluid retention  Follow-up Plans include reviewing lab results and scheduling the next appointment. - Review lab results and contact [place] if any results are abnormal - Schedule follow-up appointment with Katie  Vitals Temp: 98.4 F (36.9 C) BP: 118/75 Pulse Rate: 80 SpO2: 98 %   Anthropometric Measurements Height: 5' (1.524 m) Weight: 206 lb (93.4 kg) BMI (Calculated): 40.23 Weight at Last Visit: 207lb Weight Lost Since Last Visit: 1lb Weight Gained Since Last Visit: 0lb Starting Weight: 269lb Total Weight Loss (lbs): 63 lb (28.6 kg)   Body Composition  Body Fat %: 56.1 % Fat Mass (lbs): 116 lbs Muscle Mass (lbs): 86.2 lbs Visceral Fat Rating : 19   Other Clinical Data Fasting: Yes Labs: Yes Today's Visit #: 24 Starting Date: 08/21/21     ASSESSMENT AND PLAN:  Diet: Mavery is currently in the action stage of change. As such, her goal is to continue with weight loss efforts. She has agreed to Category 3 Plan.  Exercise: [place] has been instructed to try a geriatric exercise plan and that some exercise is better than none for weight loss and overall health benefits.   Behavior Modification:  We discussed the following Behavioral Modification Strategies today: increasing lean protein intake, decreasing simple carbohydrates, increasing vegetables, increase H2O intake,  decreasing sodium intake, increase high fiber foods, meal planning and cooking strategies, avoiding temptations, and planning for success. We discussed various medication options to help [facility] with her weight loss efforts and we both agreed to continue current treatment plan, continue to work on nutritional and behavioral strategies to promote weight loss.  .  Return in about 4 weeks (around 10/13/2023).SABRA She was informed of the importance of frequent follow up visits to maximize her success with intensive lifestyle modifications for her multiple health conditions.  Attestation Statements:   Reviewed by clinician on day of visit: allergies, medications, problem list, medical history, surgical history, family history, social history, and previous encounter notes.   Time spent on visit including pre-visit chart review and  post-visit care and charting was 40 minutes.    Joselito Fieldhouse, PA-C

## 2023-09-15 ENCOUNTER — Ambulatory Visit (INDEPENDENT_AMBULATORY_CARE_PROVIDER_SITE_OTHER): Admitting: Physician Assistant

## 2023-09-15 VITALS — BP 118/75 | HR 80 | Temp 98.4°F | Ht 60.0 in | Wt 206.0 lb

## 2023-09-15 DIAGNOSIS — E7800 Pure hypercholesterolemia, unspecified: Secondary | ICD-10-CM

## 2023-09-15 DIAGNOSIS — Z6841 Body Mass Index (BMI) 40.0 and over, adult: Secondary | ICD-10-CM

## 2023-09-15 DIAGNOSIS — I10 Essential (primary) hypertension: Secondary | ICD-10-CM

## 2023-09-15 DIAGNOSIS — J9611 Chronic respiratory failure with hypoxia: Secondary | ICD-10-CM | POA: Diagnosis not present

## 2023-09-15 DIAGNOSIS — R5383 Other fatigue: Secondary | ICD-10-CM | POA: Diagnosis not present

## 2023-09-15 DIAGNOSIS — E559 Vitamin D deficiency, unspecified: Secondary | ICD-10-CM

## 2023-09-15 DIAGNOSIS — F419 Anxiety disorder, unspecified: Secondary | ICD-10-CM

## 2023-09-15 DIAGNOSIS — R7302 Impaired glucose tolerance (oral): Secondary | ICD-10-CM

## 2023-09-15 DIAGNOSIS — I80299 Phlebitis and thrombophlebitis of other deep vessels of unspecified lower extremity: Secondary | ICD-10-CM

## 2023-09-15 DIAGNOSIS — E66813 Obesity, class 3: Secondary | ICD-10-CM

## 2023-09-15 MED ORDER — VITAMIN D (ERGOCALCIFEROL) 1.25 MG (50000 UNIT) PO CAPS: 50000.0000 [IU] | ORAL_CAPSULE | ORAL | 0 refills | Status: DC

## 2023-09-16 LAB — CMP14+EGFR
ALT: 9 IU/L (ref 0–32)
AST: 10 IU/L (ref 0–40)
Albumin: 4 g/dL (ref 3.8–4.8)
Alkaline Phosphatase: 100 IU/L (ref 44–121)
BUN/Creatinine Ratio: 19 (ref 12–28)
BUN: 13 mg/dL (ref 8–27)
Bilirubin Total: 0.4 mg/dL (ref 0.0–1.2)
CO2: 27 mmol/L (ref 20–29)
Calcium: 9.1 mg/dL (ref 8.7–10.3)
Chloride: 101 mmol/L (ref 96–106)
Creatinine, Ser: 0.67 mg/dL (ref 0.57–1.00)
Globulin, Total: 2.1 g/dL (ref 1.5–4.5)
Glucose: 88 mg/dL (ref 70–99)
Potassium: 3.5 mmol/L (ref 3.5–5.2)
Sodium: 142 mmol/L (ref 134–144)
Total Protein: 6.1 g/dL (ref 6.0–8.5)
eGFR: 92 mL/min/{1.73_m2} (ref >59)

## 2023-09-16 LAB — CBC WITH DIFFERENTIAL/PLATELET
Basophils Absolute: 0.1 10*3/uL (ref 0.0–0.2)
Basos: 1 %
EOS (ABSOLUTE): 0.2 10*3/uL (ref 0.0–0.4)
Eos: 2 %
Hematocrit: 40.2 % (ref 34.0–46.6)
Hemoglobin: 12.8 g/dL (ref 11.1–15.9)
Immature Grans (Abs): 0 10*3/uL (ref 0.0–0.1)
Immature Granulocytes: 0 %
Lymphocytes Absolute: 1.4 10*3/uL (ref 0.7–3.1)
Lymphs: 23 %
MCH: 30.3 pg (ref 26.6–33.0)
MCHC: 31.8 g/dL (ref 31.5–35.7)
MCV: 95 fL (ref 79–97)
Monocytes Absolute: 0.5 10*3/uL (ref 0.1–0.9)
Monocytes: 8 %
Neutrophils Absolute: 4.1 10*3/uL (ref 1.4–7.0)
Neutrophils: 66 %
Platelets: 262 10*3/uL (ref 150–450)
RBC: 4.22 x10E6/uL (ref 3.77–5.28)
RDW: 12.4 % (ref 11.7–15.4)
WBC: 6.3 10*3/uL (ref 3.4–10.8)

## 2023-09-16 LAB — INSULIN, RANDOM: INSULIN: 8.2 u[IU]/mL (ref 2.6–24.9)

## 2023-09-16 LAB — TSH: TSH: 3.71 u[IU]/mL (ref 0.450–4.500)

## 2023-09-16 LAB — LIPID PANEL WITH LDL/HDL RATIO
Cholesterol, Total: 134 mg/dL (ref 100–199)
HDL: 45 mg/dL (ref >39)
LDL Chol Calc (NIH): 74 mg/dL (ref 0–99)
LDL/HDL Ratio: 1.6 ratio (ref 0.0–3.2)
Triglycerides: 74 mg/dL (ref 0–149)
VLDL Cholesterol Cal: 15 mg/dL (ref 5–40)

## 2023-09-16 LAB — VITAMIN D 25 HYDROXY (VIT D DEFICIENCY, FRACTURES): Vit D, 25-Hydroxy: 49.5 ng/mL (ref 30.0–100.0)

## 2023-09-16 LAB — VITAMIN B12: Vitamin B-12: 370 pg/mL (ref 232–1245)

## 2023-09-16 LAB — HEMOGLOBIN A1C
Est. average glucose Bld gHb Est-mCnc: 103 mg/dL
Hgb A1c MFr Bld: 5.2 % (ref 4.8–5.6)

## 2023-09-17 ENCOUNTER — Telehealth: Payer: Self-pay | Admitting: Family Medicine

## 2023-09-17 NOTE — Telephone Encounter (Signed)
-----   Message from Chiquita JONELLE Cramp sent at 08/25/2023 10:33 AM EDT ----- Regarding: mammo AWV 08/25/23

## 2023-09-17 NOTE — Telephone Encounter (Signed)
 error

## 2023-10-14 ENCOUNTER — Ambulatory Visit (INDEPENDENT_AMBULATORY_CARE_PROVIDER_SITE_OTHER): Admitting: Adult Health

## 2023-10-19 ENCOUNTER — Ambulatory Visit: Payer: Self-pay | Admitting: *Deleted

## 2023-10-19 NOTE — Telephone Encounter (Signed)
 FYI Only or Action Required?: FYI only for provider.  Patient was last seen in primary care on 08/25/2023 by Luke Chiquita SAUNDERS, DO.  Called Nurse Triage reporting Foot Injury.  Symptoms began several weeks ago.  Interventions attempted: Rest, hydration, or home remedies.  Symptoms are: unchanged.  Triage Disposition: See Physician Within 24 Hours  Patient/caregiver understands and will follow disposition?: yes   Reason for Disposition  [1] Limp when walking AND [2] due to a twisted ankle or foot  Answer Assessment - Initial Assessment Questions 1. MECHANISM: How did the injury happen? (e.g., twisting injury, direct blow)      Getting out of bathtub- turned ankle- R 2. ONSET: When did the injury happen? (Minutes or hours ago)      2 weeks ago Sunday 3. LOCATION: Where is the injury located?      R side of (outside of ankle) - swelling and redness 4. APPEARANCE of INJURY: What does the injury look like?      Redness, swelling- over ankle bone/below 5. WEIGHT-BEARING: Can you put weight on that foot? Can you walk (four steps or more)?       Yes- patient is using her walker 6. SIZE: For cuts, bruises, or swelling, ask: How large is it? (e.g., inches or centimeters;  entire joint)      Bruising present/red, around the ankle, feet swollen at toes 7. PAIN: Is there pain? If Yes, ask: How bad is the pain?    (e.g., Scale 1-10; or mild, moderate, severe)   - NONE (0): no pain.   - MILD (1-3): doesn't interfere with normal activities.    - MODERATE (4-7): interferes with normal activities (e.g., work or school) or awakens from sleep, limping.    - SEVERE (8-10): excruciating pain, unable to do any normal activities, unable to walk.      No pain with no movement- stepping-8-9/10  Protocols used: Ankle and Foot Injury-A-AH   Copied from CRM #[id number]. Topic: Clinical - Red Word Triage >> Oct 19, 2023  9:12 AM Adelita E wrote: [facility] that prompted transfer to Nurse  Triage: Swelling. Patient was getting out of bath tub and rolled ankle. Right foot swelling/pain, going on for 2 weeks.

## 2023-10-20 ENCOUNTER — Ambulatory Visit (INDEPENDENT_AMBULATORY_CARE_PROVIDER_SITE_OTHER): Admitting: Adult Health

## 2023-10-20 ENCOUNTER — Encounter: Payer: Self-pay | Admitting: Adult Health

## 2023-10-20 VITALS — BP 120/80 | HR 65 | Temp 98.0°F | Ht 60.0 in | Wt 206.0 lb

## 2023-10-20 DIAGNOSIS — M25571 Pain in right ankle and joints of right foot: Secondary | ICD-10-CM

## 2023-10-20 NOTE — Progress Notes (Signed)
 Subjective:    Patient ID: Laurie Dominguez, female    DOB: May 02, 1950, 73 y.o.   MRN: [id number]  Ankle Injury    73 year old female who  has a past medical history of Colon polyp (08/16/2004), COPD (chronic obstructive pulmonary disease) (HCC), Depression, GERD (gastroesophageal reflux disease), High cholesterol, Hypertension, Joint pain, Nephrolithiasis, Obesity, SOB (shortness of breath), and Swelling of lower extremity.  She presents to the office today for an acute issue. She reports that about a week ago she  twisted her right ankle getting out of the bath tub. Since that time she has had pain around her ankle. She is able to bear weight but only when she uses her rolling walking. Pain is present with motion.  At home she has been using Advil, Ice, elevation and lidocaine patches without improvement.    Review of Systems See HPI   Past Medical History:  Diagnosis Date   Colon polyp 08/16/2004   Hyperplastic   COPD (chronic obstructive pulmonary disease) (HCC)    Depression    GERD (gastroesophageal reflux disease)    High cholesterol    Hypertension    Joint pain    Nephrolithiasis    Obesity    SOB (shortness of breath)    Swelling of lower extremity     Social History   Socioeconomic History   Marital status: Married    Spouse name: FW   Number of children: Not on file   Years of education: Not on file   Highest education level: Bachelor's degree (e.g., BA, AB, BS)  Occupational History   Occupation: Retired  Tobacco Use   Smoking status: Former    Current packs/day: 0.00    Average packs/day: 2.0 packs/day for 40.0 years (80.0 ttl pk-yrs)    Types: Cigarettes    Start date: 03/24/1966    Quit date: 03/24/2006    Years since quitting: 17.5   Smokeless tobacco: Never  Vaping Use   Vaping status: Never Used  Substance and Sexual Activity   Alcohol use: No    Alcohol/week: 0.0 standard drinks of alcohol   Drug use: No   Sexual activity: Not on file   Other Topics Concern   Not on file  Social History Narrative   Not on file   Social Drivers of Health   Financial Resource Strain: Low Risk  (02/12/2023)   Overall Financial Resource Strain (CARDIA)    Difficulty of Paying Living Expenses: Not very hard  Food Insecurity: No Food Insecurity (02/12/2023)   Hunger Vital Sign    Worried About Running Out of Food in the Last Year: Never true    Ran Out of Food in the Last Year: Never true  Transportation Needs: No Transportation Needs (02/12/2023)   PRAPARE - Administrator, Civil Service (Medical): No    Lack of Transportation (Non-Medical): No  Physical Activity: Inactive (02/12/2023)   Exercise Vital Sign    Days of Exercise per Week: 0 days    Minutes of Exercise per Session: 0 min  Stress: No Stress Concern Present (02/12/2023)   [employer] of Occupational Health - Occupational Stress Questionnaire    Feeling of Stress : Only a little  Social Connections: Moderately Integrated (02/12/2023)   Social Connection and Isolation Panel    Frequency of Communication with Friends and Family: Twice a week    Frequency of Social Gatherings with Friends and Family: Twice a week    Attends Religious Services: More than  4 times per year    Active Member of Clubs or Organizations: No    Attends Banker Meetings: Never    Marital Status: Married  Catering manager Violence: Unknown (02/12/2023)   Humiliation, Afraid, Rape, and Kick questionnaire    Fear of Current or Ex-Partner: No    Emotionally Abused: No    Physically Abused: No    Sexually Abused: Not on file    Past Surgical History:  Procedure Laterality Date   BREAST BIOPSY Left 12/2017   CHOLECYSTECTOMY     COLONOSCOPY  2006   DILATION AND CURETTAGE OF UTERUS     MOHS SURGERY     TONSILLECTOMY      Family History  Problem Relation Age of Onset   Cancer Mother        unknown primary   Liver cancer Mother        unknown primary   Other  Father        spinal cord tumor   Cancer Father    Alcoholism Father    Breast cancer Neg Hx     Allergies  Allergen Reactions   Codeine Phosphate    Fosamax  [Alendronate ] Other (See Comments)    Muscle aches   Simvastatin      Myalgia     Current Outpatient Medications on File Prior to Visit  Medication Sig Dispense Refill   albuterol  (VENTOLIN  HFA) 108 (90 Base) MCG/ACT inhaler USE 2 INHALATIONS BY MOUTH  EVERY 6 HOURS AS NEEDED FOR WHEEZING OR SHORTNESS OF  BREATH 34 g 3   amLODipine  (NORVASC ) 5 MG tablet TAKE 1 TABLET BY MOUTH DAILY 100 tablet 2   budesonide -formoterol  (SYMBICORT ) 160-4.5 MCG/ACT inhaler Inhale 2 puffs into the lungs daily. 30.6 g 0   losartan -hydrochlorothiazide (HYZAAR) 100-25 MG tablet TAKE 1 TABLET BY MOUTH DAILY 100 tablet 3   meclizine  (ANTIVERT ) 25 MG tablet Take 1 tablet (25 mg total) by mouth daily as needed. 90 tablet 1   omeprazole  (PRILOSEC) 20 MG capsule TAKE 1 CAPSULE BY MOUTH DAILY 100 capsule 2   PARoxetine  (PAXIL ) 30 MG tablet TAKE 2 TABLETS BY MOUTH IN  THE MORNING 180 tablet 3   rosuvastatin  (CRESTOR ) 5 MG tablet TAKE 1 TABLET BY MOUTH EVERY  OTHER DAY 50 tablet 2   traMADol  (ULTRAM ) 50 MG tablet Take 1 tablet (50 mg total) by mouth every 8 (eight) hours as needed for moderate pain. 60 tablet 0   triamcinolone  (KENALOG ) 0.025 % cream Apply 1 application topically 2 (two) times daily.     triamcinolone  (NASACORT ) 55 MCG/ACT AERO nasal inhaler Place 2 sprays into the nose daily. 32.4 mL 3   Vitamin D , Ergocalciferol , (DRISDOL ) 1.25 MG (50000 UNIT) CAPS capsule Take 1 capsule (50,000 Units total) by mouth every 7 (seven) days. 4 capsule 0   diazepam  (VALIUM ) 2 MG tablet Take 1 tablet (2 mg total) by mouth at bedtime as needed for anxiety or muscle spasms. (Patient not taking: Reported on 10/20/2023) 30 tablet 0   Fluocinolone Acetonide 0.01 % OIL SMARTSIG:5 Drop(s) In Ear(s) Twice Daily PRN (Patient not taking: Reported on 10/20/2023)     No current  facility-administered medications on file prior to visit.    BP 120/80   Pulse 65   Temp 98 F (36.7 C) (Oral)   Ht 5' (1.524 m)   Wt 206 lb (93.4 kg)   SpO2 95%   BMI 40.23 kg/m       Objective:   Physical Exam Vitals  and nursing note reviewed.  Constitutional:      Appearance: Normal appearance.  Musculoskeletal:     Right ankle: Swelling present. Tenderness present. Normal pulse.     Right Achilles Tendon: Normal.       Legs:     Comments: She has swelling and pain along the medial and lateral aspect of the right ankle. No bruising noted.   Neurological:     Mental Status: She is alert.         Assessment & Plan:  1. Acute right ankle pain (Primary) - Likely ankle sprain. Advised compression and ACE bandage was applied. Will order xray of ankle. She is going to [place] on xray until later in the week and if not any better will come back and get it.  - Continue with ice, compression, and NSAIDS - DG Ankle Complete Right; Future  Darleene Shape, NP

## 2023-10-20 NOTE — Telephone Encounter (Signed)
Pt has been seen in office today. 

## 2023-10-23 ENCOUNTER — Other Ambulatory Visit

## 2023-10-23 ENCOUNTER — Ambulatory Visit

## 2023-10-23 DIAGNOSIS — M25571 Pain in right ankle and joints of right foot: Secondary | ICD-10-CM | POA: Diagnosis not present

## 2023-10-23 DIAGNOSIS — M19071 Primary osteoarthritis, right ankle and foot: Secondary | ICD-10-CM | POA: Diagnosis not present

## 2023-10-23 DIAGNOSIS — M85871 Other specified disorders of bone density and structure, right ankle and foot: Secondary | ICD-10-CM | POA: Diagnosis not present

## 2023-10-28 ENCOUNTER — Ambulatory Visit (INDEPENDENT_AMBULATORY_CARE_PROVIDER_SITE_OTHER): Admitting: Adult Health

## 2023-10-28 ENCOUNTER — Encounter (INDEPENDENT_AMBULATORY_CARE_PROVIDER_SITE_OTHER): Payer: Self-pay | Admitting: Adult Health

## 2023-10-28 VITALS — BP 132/76 | HR 75 | Temp 98.0°F | Ht 60.0 in | Wt 203.0 lb

## 2023-10-28 DIAGNOSIS — E559 Vitamin D deficiency, unspecified: Secondary | ICD-10-CM

## 2023-10-28 DIAGNOSIS — J9611 Chronic respiratory failure with hypoxia: Secondary | ICD-10-CM | POA: Diagnosis not present

## 2023-10-28 DIAGNOSIS — E7800 Pure hypercholesterolemia, unspecified: Secondary | ICD-10-CM | POA: Diagnosis not present

## 2023-10-28 DIAGNOSIS — E669 Obesity, unspecified: Secondary | ICD-10-CM

## 2023-10-28 DIAGNOSIS — E66813 Obesity, class 3: Secondary | ICD-10-CM

## 2023-10-28 DIAGNOSIS — R7302 Impaired glucose tolerance (oral): Secondary | ICD-10-CM | POA: Diagnosis not present

## 2023-10-28 DIAGNOSIS — I10 Essential (primary) hypertension: Secondary | ICD-10-CM | POA: Diagnosis not present

## 2023-10-28 DIAGNOSIS — Z6841 Body Mass Index (BMI) 40.0 and over, adult: Secondary | ICD-10-CM

## 2023-10-28 DIAGNOSIS — R5383 Other fatigue: Secondary | ICD-10-CM

## 2023-10-28 DIAGNOSIS — Z6839 Body mass index (BMI) 39.0-39.9, adult: Secondary | ICD-10-CM

## 2023-10-28 MED ORDER — VITAMIN D (ERGOCALCIFEROL) 1.25 MG (50000 UNIT) PO CAPS: 50000.0000 [IU] | ORAL_CAPSULE | ORAL | 0 refills | Status: DC

## 2023-10-28 MED ORDER — CYANOCOBALAMIN 500 MCG PO TABS: 500.0000 ug | ORAL_TABLET | Freq: Every day | ORAL | 0 refills | Status: DC

## 2023-10-28 NOTE — Progress Notes (Signed)
 WEIGHT SUMMARY AND BIOMETRICS  Vitals Temp: 98 F (36.7 C) BP: 132/76 Pulse Rate: 75 SpO2: 92 %   Anthropometric Measurements Height: 5' (1.524 m) Weight: 203 lb (92.1 kg) BMI (Calculated): 39.65 Weight at Last Visit: 206 lb Weight Lost Since Last Visit: 3 lb Weight Gained Since Last Visit: 0 Starting Weight: 269 lb Total Weight Loss (lbs): 66 lb (29.9 kg) Peak Weight: 306 lb   Body Composition  Body Fat %: 56.7 % Fat Mass (lbs): 115.4 lbs Muscle Mass (lbs): 83.6 lbs Visceral Fat Rating : 19   Other Clinical Data Fasting: yes Labs: no Today's Visit #: 25 Starting Date: 08/21/21    Chief Complaint:   OBESITY Laurie Dominguez is here to discuss her progress with her obesity treatment plan.  She is on the the Category 3 Plan and states she is following her eating plan approximately 75 % of the time.  She states she is exercising: None  Interim History:  PCP increased Ozempic  from 1mg  to 2mg  on/about 06/07/2023 Denies mass in neck, dysphagia, dyspepsia, persistent hoarseness, abdominal pain, or N/V/C   She endorses stable energy and improved stamina She is not wearing supplemental O2 during OV She appears stable and NAD noted  Of note- her very supportive husband is at [facility] Her husband is the primary chef of the household  Subjective:   1. Other fatigue Discussed Labs  Latest Reference Range & Units 09/15/23 09:42  TSH 0.450 - 4.500 uIU/mL 3.710    Latest Reference Range & Units 09/15/23 09:42  Vitamin B12 232 - 1,245 pg/mL 370   09/15/2023 CBC- stable TSH stable B12- low normal- she is not on any oral B12 supplementation  2. Impaired glucose tolerance Discussed Labs  Latest Reference Range & Units 09/15/23 09:42  Glucose 70 - 99 mg/dL 88  Hemoglobin J8R 4.8 - 5.6 % 5.2  Est. average glucose Bld gHb Est-mCnc mg/dL 896  INSULIN  2.6 - 24.9 uIU/mL 8.2   CBG, A1c both at goal Insulin  only slightly above of 5 PCP increased Ozempic  from 1mg  to 2mg   on/about 06/07/2023 Denies mass in neck, dysphagia, dyspepsia, persistent hoarseness, abdominal pain, or N/V/C   3. Pure hypercholesterolemia Discussed Labs Lipid Panel     Component Value Date/Time   CHOL 134 09/15/2023 0942   TRIG 74 09/15/2023 0942   HDL 45 09/15/2023 0942   CHOLHDL 4 11/14/2022 0844   VLDL 19.0 11/14/2022 0844   LDLCALC 74 09/15/2023 0942   LDLCALC 143 (H) 11/08/2019 1009   LDLDIRECT 150.5 06/20/2008 0815   LABVLDL 15 09/15/2023 0942    PCP manages Crestor  5mg - she takes 2-3 times weekly She denies myaglias  4. Chronic respiratory failure with hypoxia (HCC) Discussed Labs 09/15/2023 CBC demonstrated stable H/H She is NOT wearing supplemental O2 at OV She denies dyspnea  5. Essential hypertension Discussed Labs 09/15/2023 CMP- Electrolytes, Kidney Fx, Liver Enzymes  6. Vitamin D  deficiency Discussed Labs  Latest Reference Range & Units 09/15/23 09:42  Vitamin D , 25-Hydroxy 30.0 - 100.0 ng/mL 49.5   Vit D Level stable and at goal She is on weekly Ergocalciferol - denies N/V/Muscle Weakness  Assessment/Plan:   1. Other fatigue Start cyanocobalamin  (VITAMIN B12) 500 MCG tablet Take 1 tablet (500 mcg total) by mouth daily. Dispense: 90 tablet, Refills: 0 ordered   2. Impaired glucose tolerance (Primary) Continue Cat 3 MP and increase daily activity Continue weekly Ozempic  per PCP  3. Pure hypercholesterolemia Continue Cat 3 MP and increase daily activity Continue  weekly Ozempic  per PCP  4. Chronic respiratory failure with hypoxia (HCC) Continue Cat 3 MP and increase daily activity  5. Essential hypertension Continue Cat 3 MP and increase daily activity Limit Na+ ontake  6. Vitamin D  deficiency Refill  Vitamin D , Ergocalciferol , (DRISDOL ) 1.25 MG (50000 UNIT) CAPS capsule Take 1 capsule (50,000 Units total) by mouth every 7 (seven) days. Dispense: 4 capsule, Refills: 0 ordered   7. BMI 40.0-44.9, adult (HCC) Current BMI 39.8  Symia is  currently in the action stage of change. As such, her goal is to continue with weight loss efforts. She has agreed to the Category 3 Plan.   Exercise goals: Older adults should follow the adult guidelines. When older adults cannot meet the adult guidelines, they should be as physically active as their abilities and conditions will allow.  Older adults should do exercises that maintain or improve balance if they are at risk of falling.  Older adults should determine their level of effort for physical activity relative to their level of fitness.  Older adults with chronic conditions should understand whether and how their conditions affect their ability to do regular physical activity safely.  Behavioral modification strategies: increasing lean protein intake, decreasing simple carbohydrates, increasing vegetables, increasing water intake, meal planning and cooking strategies, keeping healthy foods in the home, ways to avoid boredom eating, ways to avoid night time snacking, and planning for success.  Nico has agreed to follow-up with our clinic in 4 weeks. She was informed of the importance of frequent follow-up visits to maximize her success with intensive lifestyle modifications for her multiple health conditions.   Objective:   Blood pressure 132/76, pulse 75, temperature 98 F (36.7 C), height 5' (1.524 m), weight 203 lb (92.1 kg), SpO2 92%. Body mass index is 39.65 kg/m.  General: Cooperative, alert, well developed, in no acute distress. HEENT: Conjunctivae and lids unremarkable. Cardiovascular: Regular rhythm.  Lungs: Normal work of breathing. Neurologic: No focal deficits.   Lab Results  Component Value Date   CREATININE 0.67 09/15/2023   BUN 13 09/15/2023   NA 142 09/15/2023   K 3.5 09/15/2023   CL 101 09/15/2023   CO2 27 09/15/2023   Lab Results  Component Value Date   ALT 9 09/15/2023   AST 10 09/15/2023   ALKPHOS 100 09/15/2023   BILITOT 0.4 09/15/2023   Lab  Results  Component Value Date   HGBA1C 5.2 09/15/2023   HGBA1C 5.4 04/22/2023   HGBA1C 5.3 11/14/2022   HGBA1C 5.4 07/09/2022   HGBA1C 5.4 01/22/2022   Lab Results  Component Value Date   INSULIN  8.2 09/15/2023   INSULIN  8.2 04/22/2023   INSULIN  9.2 07/09/2022   INSULIN  11.5 01/22/2022   INSULIN  12.0 08/21/2021   Lab Results  Component Value Date   TSH 3.710 09/15/2023   Lab Results  Component Value Date   CHOL 134 09/15/2023   HDL 45 09/15/2023   LDLCALC 74 09/15/2023   LDLDIRECT 150.5 06/20/2008   TRIG 74 09/15/2023   CHOLHDL 4 11/14/2022   Lab Results  Component Value Date   VD25OH 49.5 09/15/2023   VD25OH 46.2 04/22/2023   VD25OH 53.9 07/09/2022   Lab Results  Component Value Date   WBC 6.3 09/15/2023   HGB 12.8 09/15/2023   HCT 40.2 09/15/2023   MCV 95 09/15/2023   PLT 262 09/15/2023   No results found for: IRON, TIBC, FERRITIN   Attestation Statements:   Reviewed by clinician on day of visit: allergies,  medications, problem list, medical history, surgical history, family history, social history, and previous encounter notes.  I have reviewed the above documentation for accuracy and completeness, and I agree with the above. -  Leni Pankonin d. Rolan

## 2023-11-03 ENCOUNTER — Ambulatory Visit: Payer: Self-pay | Admitting: Adult Health

## 2023-11-18 ENCOUNTER — Telehealth: Payer: Self-pay

## 2023-11-18 ENCOUNTER — Encounter: Payer: Medicare Other | Admitting: Adult Health

## 2023-11-18 NOTE — Progress Notes (Signed)
   11/18/2023  Patient ID: Laurie Dominguez, female   DOB: Dec 08, 1950, 73 y.o.   MRN: [id number]  Received refill request from [employer] [facility] for patient's enrolled products.  Sent in 4 month order for Ozempic  2mg . Fax confirmation received.  Jon VEAR Lindau, PharmD Clinical Pharmacist [phone]

## 2023-11-20 ENCOUNTER — Telehealth: Payer: Self-pay

## 2023-11-20 NOTE — Telephone Encounter (Signed)
 Called pt to advise that pt assistant medication is ready for pick up.

## 2023-11-30 DIAGNOSIS — L570 Actinic keratosis: Secondary | ICD-10-CM | POA: Diagnosis not present

## 2023-11-30 DIAGNOSIS — D485 Neoplasm of uncertain behavior of skin: Secondary | ICD-10-CM | POA: Diagnosis not present

## 2023-11-30 DIAGNOSIS — L820 Inflamed seborrheic keratosis: Secondary | ICD-10-CM | POA: Diagnosis not present

## 2023-11-30 DIAGNOSIS — D692 Other nonthrombocytopenic purpura: Secondary | ICD-10-CM | POA: Diagnosis not present

## 2023-11-30 DIAGNOSIS — Z85828 Personal history of other malignant neoplasm of skin: Secondary | ICD-10-CM | POA: Diagnosis not present

## 2023-12-02 ENCOUNTER — Encounter (INDEPENDENT_AMBULATORY_CARE_PROVIDER_SITE_OTHER): Payer: Self-pay | Admitting: Adult Health

## 2023-12-02 ENCOUNTER — Ambulatory Visit (INDEPENDENT_AMBULATORY_CARE_PROVIDER_SITE_OTHER): Admitting: Adult Health

## 2023-12-02 VITALS — BP 123/79 | HR 73 | Temp 98.5°F | Ht 60.0 in | Wt 205.0 lb

## 2023-12-02 DIAGNOSIS — Z6841 Body Mass Index (BMI) 40.0 and over, adult: Secondary | ICD-10-CM

## 2023-12-02 DIAGNOSIS — I10 Essential (primary) hypertension: Secondary | ICD-10-CM

## 2023-12-02 DIAGNOSIS — R7302 Impaired glucose tolerance (oral): Secondary | ICD-10-CM

## 2023-12-02 DIAGNOSIS — J9611 Chronic respiratory failure with hypoxia: Secondary | ICD-10-CM

## 2023-12-02 DIAGNOSIS — E559 Vitamin D deficiency, unspecified: Secondary | ICD-10-CM

## 2023-12-02 MED ORDER — VITAMIN D (ERGOCALCIFEROL) 1.25 MG (50000 UNIT) PO CAPS: 50000.0000 [IU] | ORAL_CAPSULE | ORAL | 0 refills | Status: DC

## 2023-12-02 NOTE — Progress Notes (Signed)
 WEIGHT SUMMARY AND BIOMETRICS  Vitals Temp: 98.5 F (36.9 C) BP: 123/79 Pulse Rate: 73 SpO2: 94 %   Anthropometric Measurements Height: 5' (1.524 m) Weight: 205 lb (93 kg) BMI (Calculated): 40.04 Weight at Last Visit: 206 LB Weight Lost Since Last Visit: 0 Weight Gained Since Last Visit: 1 lb Starting Weight: 269 LB Total Weight Loss (lbs): 65 lb (29.5 kg) Peak Weight: 306 LB   Body Composition  Body Fat %: 56.5 % Fat Mass (lbs): 115.8 lbs Muscle Mass (lbs): 84.6 lbs Visceral Fat Rating : 19   Other Clinical Data Fasting: NO Labs: NO Today's Visit #: 26 Starting Date: 08/21/21    Chief Complaint:   OBESITY Laurie Dominguez is here to discuss her progress with her obesity treatment plan.  She is on the the Category 3 Plan and states she is following her eating plan approximately 80 % of the time.  She states she is exercising Seated Exercises 10 minutes 2 times per week.  Interim History:  She and her husband have been enjoying foods off plan, ie: homemade cookies.  She reports stable respiratory status, not wearing supplemental O2 at OV  Of note- her wonderful, supportive husband is at [facility]  Subjective:   1. Impaired glucose tolerance PCP manages weekly Ozempic  2mg  Denies mass in neck, dysphagia, dyspepsia, persistent hoarseness, abdominal pain, or N/V/C  She recently received a 4 month supply via [employer] pt assistance program  2. Essential hypertension BP stable and at goal at OV She denies CP with exertion She is on  amLODipine  (NORVASC ) 5 MG tablet  losartan -hydrochlorothiazide (HYZAAR) 100-25 MG tablet  rosuvastatin  (CRESTOR ) 5 MG tablet   3. Vitamin D  deficiency She is on weekly Ergocalciferol - denies N/V/Muscle Weakness  4. Chronic respiratory failure with hypoxia (HCC) 05/02/2022 [facility] OV Notes: History of Present Illness Laurie Dominguez is a 73 y.o. female  former smoker ( Quit 2008 with an 80 pack year smoking history)  with  COPD, OSA, not wearing CPAP, chronic hypoxic respiratory failure  on home oxygen . She is followed by Dr. Neysa. She is also followed through the lung 05/02/2022 Pt. Presents for follow up. She was last seen in the office 01/2021 when she was evaluated for OSA. Sleep study showed she did not have OSA, but she did have nocturnal desaturations. She currently wears oxygen  at 3 L [place] at bedtime.  She states she has been doing well. She is here in a motorized wheel chair. She is wearing her oxygen  at 2.5 to 3 L [place]. Her maintenance is Symbicort . She also has a rescue inhaler that she uses very rarely. She states she needs her rescue more in the summer than in the winter. She does have issues with her sinuses with weather changes. She does use Flonase  when her sinuses are acting up. She also takes Mucinex  at bedtime. She feels her COPD has been stable. No significant flare.  She denies any wheezing She no longer needs her lasix  since she has had weight loss.     She has lost 60 pounds through the [facility]. She feels much better after losing the weight.  Assessment/Plan  nocturnal hypoxemia Plan - Wear oxygen  at 3 L [place] at bedtime - Avoid alcohol, sedatives and other CNS depressants that may worsen sleep apnea and disrupt    normal sleep architecture. - Sleep hygiene should be optimized - Work on weight loss, and increasing exercise as able. - Follow up with  Dr. Neysa in 6 months or sooner as needed  Chronic Hypoxic Respiratory Failure Does not Qualify for Inogen as the pulsed oxygen  max does not meet her ambulatory needs.  Plan Remember oxygen  saturations must always be > 88%. It is important to wear your oxygen  at all times.  Remember to monitor your oxygen  levels with an oxygen  saturation monitor.  Assessment/Plan:   1. Impaired glucose tolerance (Primary) Continue healthy eating Increase regular exercise, at least 2 x week engage in Guided Exercise Continue weekly  GLP-1 therapy per PCP  2. Essential hypertension Continue healthy eating Increase regular exercise, at least 2 x week engage in Guided Exercise Continue amLODipine  (NORVASC ) 5 MG tablet  losartan -hydrochlorothiazide (HYZAAR) 100-25 MG tablet  rosuvastatin  (CRESTOR ) 5 MG tablet    3. Vitamin D  deficiency Refill - Vitamin D , Ergocalciferol , (DRISDOL ) 1.25 MG (50000 UNIT) CAPS capsule; Take 1 capsule (50,000 Units total) by mouth every 7 (seven) days.  Dispense: 4 capsule; Refill: 0  4. Chronic respiratory failure with hypoxia (HCC) Continue to monitor respiratory status Utilize supplemental O2 as needed.  5. BMI 40.0-44.9, adult (HCC) Current BMI 40.0  Laurie Dominguez is currently in the action stage of change. As such, her goal is to continue with weight loss efforts. She has agreed to the Category 3 Plan.   Exercise goals: Older adults should follow the adult guidelines. When older adults cannot meet the adult guidelines, they should be as physically active as their abilities and conditions will allow.  Older adults should do exercises that maintain or improve balance if they are at risk of falling.  Older adults should determine their level of effort for physical activity relative to their level of fitness.  Older adults with chronic conditions should understand whether and how their conditions affect their ability to do regular physical activity safely. Guided Exercise 2 x week.  Behavioral modification strategies: increasing lean protein intake, decreasing simple carbohydrates, increasing vegetables, increasing water intake, no skipping meals, meal planning and cooking strategies, keeping healthy foods in the home, ways to avoid boredom eating, better snacking choices, emotional eating strategies, and planning for success.  [name] has agreed to follow-up with our clinic in 4 weeks. She was informed of the importance of frequent follow-up visits to maximize her success with intensive  lifestyle modifications for her multiple health conditions. sults.  Objective:   Blood pressure 123/79, pulse 73, temperature 98.5 F (36.9 C), height 5' (1.524 m), weight 205 lb (93 kg), SpO2 94%. Body mass index is 40.04 kg/m.  General: Cooperative, alert, well developed, in no acute distress. HEENT: Conjunctivae and lids unremarkable. Cardiovascular: Regular rhythm.  Lungs: Normal work of breathing. Neurologic: No focal deficits.   Lab Results  Component Value Date   CREATININE 0.67 09/15/2023   BUN 13 09/15/2023   NA 142 09/15/2023   K 3.5 09/15/2023   CL 101 09/15/2023   CO2 27 09/15/2023   Lab Results  Component Value Date   ALT 9 09/15/2023   AST 10 09/15/2023   ALKPHOS 100 09/15/2023   BILITOT 0.4 09/15/2023   Lab Results  Component Value Date   HGBA1C 5.2 09/15/2023   HGBA1C 5.4 04/22/2023   HGBA1C 5.3 11/14/2022   HGBA1C 5.4 07/09/2022   HGBA1C 5.4 01/22/2022   Lab Results  Component Value Date   INSULIN  8.2 09/15/2023   INSULIN  8.2 04/22/2023   INSULIN  9.2 07/09/2022   INSULIN  11.5 01/22/2022   INSULIN  12.0 08/21/2021   Lab Results  Component Value Date  TSH 3.710 09/15/2023   Lab Results  Component Value Date   CHOL 134 09/15/2023   HDL 45 09/15/2023   LDLCALC 74 09/15/2023   LDLDIRECT 150.5 06/20/2008   TRIG 74 09/15/2023   CHOLHDL 4 11/14/2022   Lab Results  Component Value Date   VD25OH 49.5 09/15/2023   VD25OH 46.2 04/22/2023   VD25OH 53.9 07/09/2022   Lab Results  Component Value Date   WBC 6.3 09/15/2023   HGB 12.8 09/15/2023   HCT 40.2 09/15/2023   MCV 95 09/15/2023   PLT 262 09/15/2023   No results found for: IRON, TIBC, FERRITIN  Attestation Statements:   Reviewed by clinician on day of visit: allergies, medications, problem list, medical history, surgical history, family history, social history, and previous encounter notes.  I have reviewed the above documentation for accuracy and completeness, and I agree  with the above. -  Eudell Mcphee d. Esperansa Sarabia, NP-C

## 2023-12-08 ENCOUNTER — Other Ambulatory Visit: Payer: Self-pay | Admitting: Adult Health

## 2023-12-10 ENCOUNTER — Encounter: Payer: Self-pay | Admitting: Adult Health

## 2023-12-10 ENCOUNTER — Ambulatory Visit: Admitting: Adult Health

## 2023-12-10 VITALS — BP 130/60 | HR 68 | Temp 98.2°F | Ht 61.0 in | Wt 208.0 lb

## 2023-12-10 DIAGNOSIS — E782 Mixed hyperlipidemia: Secondary | ICD-10-CM | POA: Diagnosis not present

## 2023-12-10 DIAGNOSIS — Z0000 Encounter for general adult medical examination without abnormal findings: Secondary | ICD-10-CM

## 2023-12-10 DIAGNOSIS — I10 Essential (primary) hypertension: Secondary | ICD-10-CM | POA: Diagnosis not present

## 2023-12-10 DIAGNOSIS — J9611 Chronic respiratory failure with hypoxia: Secondary | ICD-10-CM

## 2023-12-10 DIAGNOSIS — Z6839 Body mass index (BMI) 39.0-39.9, adult: Secondary | ICD-10-CM

## 2023-12-10 DIAGNOSIS — F32A Depression, unspecified: Secondary | ICD-10-CM

## 2023-12-10 DIAGNOSIS — M170 Bilateral primary osteoarthritis of knee: Secondary | ICD-10-CM | POA: Diagnosis not present

## 2023-12-10 DIAGNOSIS — J449 Chronic obstructive pulmonary disease, unspecified: Secondary | ICD-10-CM | POA: Diagnosis not present

## 2023-12-10 DIAGNOSIS — E7439 Other disorders of intestinal carbohydrate absorption: Secondary | ICD-10-CM | POA: Diagnosis not present

## 2023-12-10 DIAGNOSIS — Z7984 Long term (current) use of oral hypoglycemic drugs: Secondary | ICD-10-CM

## 2023-12-10 DIAGNOSIS — M810 Age-related osteoporosis without current pathological fracture: Secondary | ICD-10-CM

## 2023-12-10 DIAGNOSIS — L409 Psoriasis, unspecified: Secondary | ICD-10-CM

## 2023-12-10 MED ORDER — PAROXETINE HCL 30 MG PO TABS: 60.0000 mg | ORAL_TABLET | Freq: Every day | ORAL | 3 refills | Status: AC

## 2023-12-10 MED ORDER — METHYLPREDNISOLONE ACETATE 80 MG/ML IJ SUSP
80.0000 mg | Freq: Once | INTRAMUSCULAR | Status: AC
  Administered 2023-12-10: 80 mg via INTRAMUSCULAR

## 2023-12-10 NOTE — Progress Notes (Signed)
 Subjective:    Patient ID: Laurie Dominguez, female    DOB: [birth year], 73 y.o.   MRN: [id number]  HPI Patient presents for yearly preventative medicine examination. She is a pleasant 73 year old female who  has a past medical history of Colon polyp (08/16/2004), COPD (chronic obstructive pulmonary disease) (HCC), Depression, GERD (gastroesophageal reflux disease), High cholesterol, Hypertension, Joint pain, Nephrolithiasis, Obesity, SOB (shortness of breath), and Swelling of lower extremity.  Obesity/impaired glucose tolerance- she is being seen by the [facility] and is on Ozempic  2.0 mg weekly. She is tolerating Ozempic  well without side effects such as dysphagia, dyspepsia, abdominal pain, N/V/C. She had not been eating healthy but her and her husband are getting back into meal planning and healthy eating.  Lab Results  Component Value Date   HGBA1C 5.2 09/15/2023   HGBA1C 5.4 04/22/2023   HGBA1C 5.3 11/14/2022   Wt Readings from Last 10 Encounters:  12/10/23 208 lb (94.3 kg)  12/02/23 205 lb (93 kg)  10/28/23 203 lb (92.1 kg)  10/20/23 206 lb (93.4 kg)  09/15/23 206 lb (93.4 kg)  08/03/23 207 lb (93.9 kg)  06/24/23 207 lb (93.9 kg)  05/28/23 213 lb (96.6 kg)  05/26/23 208 lb (94.3 kg)  04/22/23 206 lb (93.4 kg)   Osteoporosis -has been on Fosamax  in the past but stopped this due to joint pain.  She has refused other medications in there past but is interested in the them now.  Her last bone density screen was in April 2024 which showed a T score of -2.8  Hypertension-managed with Norvasc  5 mg, Hyzaar 100-25 mg daily, and Lasix  20 mg daily PRN .  She denies dizziness, lightheadedness, chest pain, or shortness of breath BP Readings from Last 3 Encounters:  12/10/23 130/60  12/02/23 123/79  10/28/23 132/76   COPD-uses nocturnal oxygen  as well as Symbicort .  She is followed by pulmonary.  Feels as though her symptoms are controlled  Anxiety/depression-takes Paxil   30 mg daily and Valium  2 mg as needed, she does feel well controlled.  Denies any depressive or anxiety symptoms  Osteoarthritis of bilateral knees-uses tramadol  50 mg as needed and naproxen  as needed.  She does not ambulate much due to chronic pain.  Uses a motorized wheelchair to get around. She does get periodic steroid injections, with her last being in March 2025. She reports that over the last month her right knee has started to become painful again   Hyperlipidemia -tolerating Crestor  5 mg every other day.  Denies myalgia or fatigue with this regimen Lab Results  Component Value Date   CHOL 134 09/15/2023   HDL 45 09/15/2023   LDLCALC 74 09/15/2023   LDLDIRECT 150.5 06/20/2008   TRIG 74 09/15/2023   CHOLHDL 4 11/14/2022    Psoriasis -uses steroid creams.  Managed by dermatology  All immunizations and health maintenance protocols were reviewed with the patient and needed orders were placed.  Appropriate screening laboratory values were ordered for the patient including screening of hyperlipidemia, renal function and hepatic function.   Medication reconciliation,  past medical history, social history, problem list and allergies were reviewed in detail with the patient  Goals were established with regard to weight loss, exercise, and  diet in compliance with medications  She is up to date on routine colon cancer screening and mammograms.   Review of Systems  Constitutional: Negative.   Eyes: Negative.   Respiratory: Negative.    Cardiovascular: Negative.   Gastrointestinal:  Negative.   Endocrine: Negative.   Genitourinary: Negative.   Musculoskeletal:  Positive for arthralgias, back pain and gait problem.  Skin: Negative.   Allergic/Immunologic: Negative.   Hematological: Negative.   Psychiatric/Behavioral: Negative.     Past Medical History:  Diagnosis Date   Colon polyp 08/16/2004   Hyperplastic   COPD (chronic obstructive pulmonary disease) (HCC)    Depression     GERD (gastroesophageal reflux disease)    High cholesterol    Hypertension    Joint pain    Nephrolithiasis    Obesity    SOB (shortness of breath)    Swelling of lower extremity     Social History   Socioeconomic History   Marital status: Married    Spouse name: FW   Number of children: Not on file   Years of education: Not on file   Highest education level: Bachelor's degree (e.g., BA, AB, BS)  Occupational History   Occupation: Retired  Tobacco Use   Smoking status: Former    Current packs/day: 0.00    Average packs/day: 2.0 packs/day for 40.0 years (80.0 ttl pk-yrs)    Types: Cigarettes    Start date: 03/24/1966    Quit date: 03/24/2006    Years since quitting: 17.7   Smokeless tobacco: Never  Vaping Use   Vaping status: Never Used  Substance and Sexual Activity   Alcohol use: No    Alcohol/week: 0.0 standard drinks of alcohol   Drug use: No   Sexual activity: Not on file  Other Topics Concern   Not on file  Social History Narrative   Not on file   Social Drivers of Health   Financial Resource Strain: Low Risk  (02/12/2023)   Overall Financial Resource Strain (CARDIA)    Difficulty of Paying Living Expenses: Not very hard  Food Insecurity: No Food Insecurity (02/12/2023)   Hunger Vital Sign    Worried About Running Out of Food in the Last Year: Never true    Ran Out of Food in the Last Year: Never true  Transportation Needs: No Transportation Needs (02/12/2023)   PRAPARE - Administrator, Civil Service (Medical): No    Lack of Transportation (Non-Medical): No  Physical Activity: Inactive (02/12/2023)   Exercise Vital Sign    Days of Exercise per Week: 0 days    Minutes of Exercise per Session: 0 min  Stress: No Stress Concern Present (02/12/2023)   [employer] of Occupational Health - Occupational Stress Questionnaire    Feeling of Stress : Only a little  Social Connections: Moderately Integrated (02/12/2023)   Social Connection and  Isolation Panel    Frequency of Communication with Friends and Family: Twice a week    Frequency of Social Gatherings with Friends and Family: Twice a week    Attends Religious Services: More than 4 times per year    Active Member of [employer] or Organizations: No    Attends Banker Meetings: Never    Marital Status: Married  Catering manager Violence: Unknown (02/12/2023)   Humiliation, Afraid, Rape, and Kick questionnaire    Fear of Current or Ex-Partner: No    Emotionally Abused: No    Physically Abused: No    Sexually Abused: Not on file    Past Surgical History:  Procedure Laterality Date   BREAST BIOPSY Left 12/2017   CHOLECYSTECTOMY     COLONOSCOPY  2006   DILATION AND CURETTAGE OF UTERUS     MOHS SURGERY  TONSILLECTOMY      Family History  Problem Relation Age of Onset   Cancer Mother        unknown primary   Liver cancer Mother        unknown primary   Other Father        spinal cord tumor   Cancer Father    Alcoholism Father    Breast cancer Neg Hx     Allergies  Allergen Reactions   Codeine Phosphate    Fosamax  [Alendronate ] Other (See Comments)    Muscle aches   Simvastatin      Myalgia     Current Outpatient Medications on File Prior to Visit  Medication Sig Dispense Refill   albuterol  (VENTOLIN  HFA) 108 (90 Base) MCG/ACT inhaler USE 2 INHALATIONS BY MOUTH  EVERY 6 HOURS AS NEEDED FOR WHEEZING OR SHORTNESS OF  BREATH 34 g 3   amLODipine  (NORVASC ) 5 MG tablet TAKE 1 TABLET BY MOUTH DAILY 100 tablet 2   budesonide -formoterol  (SYMBICORT ) 160-4.5 MCG/ACT inhaler Inhale 2 puffs into the lungs daily. 30.6 g 0   cyanocobalamin  (VITAMIN B12) 500 MCG tablet Take 1 tablet (500 mcg total) by mouth daily. 90 tablet 0   hydrocortisone  2.5 % ointment SMARTSIG:Topical     losartan -hydrochlorothiazide (HYZAAR) 100-25 MG tablet TAKE 1 TABLET BY MOUTH DAILY 100 tablet 3   omeprazole  (PRILOSEC) 20 MG capsule TAKE 1 CAPSULE BY MOUTH DAILY 100 capsule 2    PARoxetine  (PAXIL ) 30 MG tablet TAKE 2 TABLETS BY MOUTH IN  THE MORNING 180 tablet 3   rosuvastatin  (CRESTOR ) 5 MG tablet TAKE 1 TABLET BY MOUTH EVERY  OTHER DAY 50 tablet 2   Semaglutide , 2 MG/DOSE, (OZEMPIC , 2 MG/DOSE,) 8 MG/3ML SOPN Inject 2 mg into the skin once a week.     triamcinolone  (KENALOG ) 0.025 % cream Apply 1 application topically 2 (two) times daily.     triamcinolone  (NASACORT ) 55 MCG/ACT AERO nasal inhaler Place 2 sprays into the nose daily. 32.4 mL 3   Vitamin D , Ergocalciferol , (DRISDOL ) 1.25 MG (50000 UNIT) CAPS capsule Take 1 capsule (50,000 Units total) by mouth every 7 (seven) days. 4 capsule 0   No current facility-administered medications on file prior to visit.    BP 130/60   Pulse 68   Temp 98.2 F (36.8 C) (Oral)   Ht 5' 1 (1.549 m)   Wt 208 lb (94.3 kg)   SpO2 95%   BMI 39.30 kg/m       Objective:   Physical Exam Vitals and nursing note reviewed.  Constitutional:      General: She is not in acute distress.    Appearance: Normal appearance. She is obese. She is not ill-appearing.  HENT:     Head: Normocephalic and atraumatic.     Right Ear: Tympanic membrane, ear canal and external ear normal. There is no impacted cerumen.     Left Ear: Tympanic membrane, ear canal and external ear normal. There is no impacted cerumen.     Nose: Nose normal. No congestion or rhinorrhea.     Mouth/Throat:     Mouth: Mucous membranes are moist.     Pharynx: Oropharynx is clear.  Eyes:     Extraocular Movements: Extraocular movements intact.     Conjunctiva/sclera: Conjunctivae normal.     Pupils: Pupils are equal, round, and reactive to light.  Neck:     Vascular: No carotid bruit.  Cardiovascular:     Rate and Rhythm: Normal rate and regular rhythm.  Pulses: Normal pulses.     Heart sounds: No murmur heard.    No friction rub. No gallop.  Pulmonary:     Effort: Pulmonary effort is normal.     Breath sounds: Normal breath sounds.  Abdominal:      General: Abdomen is flat. Bowel sounds are normal. There is no distension.     Palpations: Abdomen is soft. There is no mass.     Tenderness: There is no abdominal tenderness. There is no guarding or rebound.     Hernia: No hernia is present.  Musculoskeletal:        General: Normal range of motion.     Cervical back: Normal range of motion and neck supple.  Lymphadenopathy:     Cervical: No cervical adenopathy.  Skin:    General: Skin is warm and dry.     Capillary Refill: Capillary refill takes less than 2 seconds.  Neurological:     General: No focal deficit present.     Mental Status: She is alert and oriented to person, place, and time.     Motor: Weakness present.     Gait: Gait abnormal.  Psychiatric:        Mood and Affect: Mood normal.        Behavior: Behavior normal.        Thought Content: Thought content normal.        Judgment: Judgment normal.        Assessment & Plan:  1. Routine general medical examination at a health care facility Today patient counseled on age appropriate routine health concerns for screening and prevention, each reviewed and up to date or declined. Immunizations reviewed and up to date or declined. Labs ordered and reviewed. Risk factors for depression reviewed and negative. Hearing function and visual acuity are intact. ADLs screened and addressed as needed. Functional ability and level of safety reviewed and appropriate. Education, counseling and referrals performed based on assessed risks today. Patient provided with a copy of personalized plan for preventive services. - She recently had lab work done through [facility] and [facility]. All of her lab work looks great. I do not see a need to repeat that today   2. Glucose intolerance - Continue with Ozempic  2 mg  - Work on healthy eating   3. 3. Morbid obesity (HCC) - She has not lost much weight. Encouraged lifestyle modifications   4. BMI 39.0-39.9,adult   5. Age-related osteoporosis  without current pathological fracture - Will refer to sports medicine  - Ambulatory referral to Sports Medicine  6. Essential hypertension - Well controlled. No change in medication   7. COPD mixed type (HCC) - At baseline. Continue with current therapy  8. Chronic respiratory failure with hypoxia (HCC) - At baseline. Continue with current therapy   9. Anxiety and depression - Controlled. No change in medication  - PARoxetine  (PAXIL ) 30 MG tablet; Take 2 tablets (60 mg total) by mouth daily.  Dispense: 180 tablet; Refill: 3  10. Primary osteoarthritis of both knees (Primary) Joint Injection/Arthrocentesis  Date/Time: 12/10/2023 11:10 AM  Performed by: Merna Huxley, NP Authorized by: Merna Huxley, NP  Indications: pain  Body area: knee Joint: right knee Local anesthesia used: yes  Anesthesia: Local anesthesia used: yes Local Anesthetic: topical anesthetic  Sedation: Patient sedated: no  Needle size: 22 G Ultrasound guidance: no Approach: posterior Triamcinolone  amount: 80 mg Lidocaine 1% amount: 2 mL Patient tolerance: patient tolerated the procedure well with no immediate complications    -  methylPREDNISolone  acetate (DEPO-MEDROL ) injection 80 mg  11. Hyperlipemia, mixed - Continue with statin   12. Psoriasis - Per dermatology   Darleene Shape, NP

## 2023-12-23 ENCOUNTER — Ambulatory Visit: Admitting: Family Medicine

## 2023-12-23 ENCOUNTER — Telehealth: Payer: Self-pay

## 2023-12-23 ENCOUNTER — Encounter: Payer: Self-pay | Admitting: Family Medicine

## 2023-12-23 VITALS — BP 130/82 | HR 81 | Ht 61.0 in | Wt 200.4 lb

## 2023-12-23 DIAGNOSIS — M810 Age-related osteoporosis without current pathological fracture: Secondary | ICD-10-CM | POA: Diagnosis not present

## 2023-12-23 NOTE — Progress Notes (Signed)
   I, Leotis Batter, CMA acting as a scribe for Artist Lloyd, MD.  Laurie Dominguez is a 73 y.o. female who presents to [facility] at [facility] today for osteoporosis management.  DEXA scan (date, T-score): 07/01/22: R-FN= -2.8,  Prior treatment: intolerant to Fosamax   History of Hip, Spine, or Wrist Fx: no - fractured toe Heart disease or stroke: no Cancer: skin Kidney Disease: nephrolithiasis Gastric/Peptic Ulcer: no Gastric bypass surgery: no Severe GERD: yes - Omeprazole  Hx of seizures: no Age at Menopause: mid-50's Calcium  intake: not supplementing, eats dairy Vitamin D  intake: supplementing Hormone replacement therapy: no Smoking history: yes - former, quit 22 years ago Alcohol: no Exercise: no Major dental work in past year: no Parents with hip/spine fracture: no Height loss: 3   Pertinent review of systems: No fevers or chills  Relevant historical information: Respiratory failure COPD knee arthritis osteoporosis   Exam:  BP 130/82   Pulse 81   Ht 5' 1 (1.549 m)   Wt 200 lb 6.4 oz (90.9 kg)   SpO2 92%   BMI 37.87 kg/m  General: Well Developed, well nourished, and in no acute distress.   MSK: Patient seated in a electric scooter.      Assessment and Plan: 73 y.o. female with osteoporosis.  Unfortunately patient is intolerant to bisphosphonates causing muscle pains aches and fatigue.  She was intolerant to oral bisphosphonates.  I think is likely she is getting a very similar set of side effects with IV bisphosphonates.  After discussion plan to use Prolia or bio equivalent.  Will check labs in preparation for Prolia and we will go ahead and order Prolia or bio equivalent.   PDMP not reviewed this encounter. Orders Placed This Encounter  Procedures   Comprehensive metabolic panel with GFR    Osteoporis    Standing Status:   Future    Number of Occurrences:   1    Expiration Date:   12/22/2024   Magnesium    Therapeutic drug  monitoring    Standing Status:   Future    Number of Occurrences:   1    Expiration Date:   12/22/2024   Phosphorus    Osteroporis    Standing Status:   Future    Number of Occurrences:   1    Expiration Date:   12/22/2024   VITAMIN D  25 Hydroxy (Vit-D Deficiency, Fractures)    Osteoporis    Standing Status:   Future    Number of Occurrences:   1    Expiration Date:   12/22/2024   No orders of the defined types were placed in this encounter.    Discussed warning signs or symptoms. Please see discharge instructions. Patient expresses understanding.   The above documentation has been reviewed and is accurate and complete Artist Lloyd, M.D.

## 2023-12-23 NOTE — Patient Instructions (Addendum)
 Thank you for coming in today.   We will order Prolia / Jubbonti to the [facility], someone from the infusion center will reach out to assist with scheduling over the next 1-2 weeks.   Stop by lab before you go.   See you back in 1 year, sooner if needed.

## 2023-12-23 NOTE — Telephone Encounter (Signed)
 Prolia / Jubbonti to [facility]

## 2023-12-24 ENCOUNTER — Ambulatory Visit: Payer: Self-pay | Admitting: Family Medicine

## 2023-12-24 ENCOUNTER — Other Ambulatory Visit (HOSPITAL_COMMUNITY): Payer: Self-pay | Admitting: Family Medicine

## 2023-12-24 LAB — COMPREHENSIVE METABOLIC PANEL WITH GFR
ALT: 10 U/L (ref 0–35)
AST: 15 U/L (ref 0–37)
Albumin: 3.9 g/dL (ref 3.5–5.2)
Alkaline Phosphatase: 76 U/L (ref 39–117)
BUN: 15 mg/dL (ref 6–23)
CO2: 28 meq/L (ref 19–32)
Calcium: 9.4 mg/dL (ref 8.4–10.5)
Chloride: 103 meq/L (ref 96–112)
Creatinine, Ser: 0.67 mg/dL (ref 0.40–1.20)
GFR: 86.02 mL/min (ref >60.00)
Glucose, Bld: 104 mg/dL — ABNORMAL HIGH (ref 70–99)
Potassium: 3.4 meq/L — ABNORMAL LOW (ref 3.5–5.1)
Sodium: 144 meq/L (ref 135–145)
Total Bilirubin: 0.4 mg/dL (ref 0.2–1.2)
Total Protein: 6.7 g/dL (ref 6.0–8.3)

## 2023-12-24 LAB — MAGNESIUM: Magnesium: 1.9 mg/dL (ref 1.5–2.5)

## 2023-12-24 LAB — VITAMIN D 25 HYDROXY (VIT D DEFICIENCY, FRACTURES): VITD: 52.82 ng/mL (ref 30.00–100.00)

## 2023-12-24 LAB — PHOSPHORUS: Phosphorus: 3.2 mg/dL (ref 2.3–4.6)

## 2023-12-24 NOTE — Telephone Encounter (Signed)
Prolia VOB initiated via [email or web address]  Next Prolia inj DUE: new start

## 2023-12-24 NOTE — Progress Notes (Signed)
 Labs look okay.  Vitamin D  levels appropriate.  Okay to proceed with Prolia.

## 2023-12-28 NOTE — Telephone Encounter (Signed)
 [facility] - Prior Authorization REQUIRED for [employer]

## 2023-12-30 ENCOUNTER — Ambulatory Visit (INDEPENDENT_AMBULATORY_CARE_PROVIDER_SITE_OTHER): Admitting: Adult Health

## 2023-12-30 ENCOUNTER — Encounter (INDEPENDENT_AMBULATORY_CARE_PROVIDER_SITE_OTHER): Payer: Self-pay | Admitting: Adult Health

## 2023-12-30 VITALS — BP 128/77 | HR 73 | Temp 98.6°F | Ht 60.0 in | Wt 198.0 lb

## 2023-12-30 DIAGNOSIS — E669 Obesity, unspecified: Secondary | ICD-10-CM

## 2023-12-30 DIAGNOSIS — E7439 Other disorders of intestinal carbohydrate absorption: Secondary | ICD-10-CM

## 2023-12-30 DIAGNOSIS — E559 Vitamin D deficiency, unspecified: Secondary | ICD-10-CM

## 2023-12-30 DIAGNOSIS — Z6838 Body mass index (BMI) 38.0-38.9, adult: Secondary | ICD-10-CM

## 2023-12-30 DIAGNOSIS — J449 Chronic obstructive pulmonary disease, unspecified: Secondary | ICD-10-CM | POA: Diagnosis not present

## 2023-12-30 DIAGNOSIS — M810 Age-related osteoporosis without current pathological fracture: Secondary | ICD-10-CM

## 2023-12-30 MED ORDER — VITAMIN D (ERGOCALCIFEROL) 1.25 MG (50000 UNIT) PO CAPS
50000.0000 [IU] | ORAL_CAPSULE | ORAL | 0 refills | Status: DC
Start: 2023-12-30 — End: 2024-02-01

## 2023-12-30 NOTE — Progress Notes (Signed)
 WEIGHT SUMMARY AND BIOMETRICS  Vitals Temp: 98.6 F (37 C) BP: 128/77 Pulse Rate: 73 SpO2: 95 %   Anthropometric Measurements Height: 5' (1.524 m) Weight: 198 lb (89.8 kg) BMI (Calculated): 38.67 Weight at Last Visit: 205lb Weight Lost Since Last Visit: 7lb Weight Gained Since Last Visit: 0lb Starting Weight: 269lb Total Weight Loss (lbs): 71 lb (32.2 kg) Peak Weight: 306lb   Body Composition  Body Fat %: 54.5 % Fat Mass (lbs): 108 lbs Muscle Mass (lbs): 85.6 lbs Visceral Fat Rating : 18   Other Clinical Data Fasting: No Labs: no Today's Visit #: 27 Starting Date: 08/21/21    Chief Complaint:   OBESITY Laurie Dominguez is here to discuss her progress with her obesity treatment plan.  She is on the the Category 3 Plan and states she is following her eating plan approximately 80 % of the time.  She states she is exercising Walking 15 minutes 3 times per week.  Interim History:  Reviewed Biompedence Results with pt: Muscle Mass: +1 lb Adipose Mass: -7.8 lbs  She has been able to increase daily walking inside home and a little in the yard- she denies palpitations, CP, or dyspnea with exertion.  She has been using supplemental O2 much less often during daytime. She is on RA today, O2 sat 95% She denies dyspnea at present.  She recently received information from her insurance company that in 2026 she will not receive financial assistance GLP-1 therapy. She has a large supply of Ozempic  2mg  at home. She was already considering reducing weekly dose from 2mg  to 1mg . Ozempic  pen is dialable and she can deliver a 1mg  dose from the 2mg  pen- this will also prolong her home GLP- 1 supply.  Subjective:   1. Glucose intolerance Lab Results  Component Value Date   HGBA1C 5.2 09/15/2023   HGBA1C 5.4 04/22/2023   HGBA1C 5.3 11/14/2022    PCP increased Ozempic  from 1mg  to 2mg  on/about 06/07/2023  She recently received information from her insurance company that in  2026 she will not receive financial assistance GLP-1 therapy. She has a large supply of Ozempic  2mg  at home. She was already considering reducing weekly dose from 2mg  to 1mg . Ozempic  pen is dialable and she can deliver a 1mg  dose from the 2mg  pen- this will also prolong her home GLP- 1 supply. She will reduce weekly Ozempic  from 2mg  to 1mg  this Friday, 01/01/24  2. COPD mixed type (HCC) She has been using supplemental O2 much less often during daytime. She is on RA today, O2 sat 95% She denies dyspnea at present. She has been able to increase daily walking inside home and a little in the yard- she denies palpitations, CP, or dyspnea with exertion.  3. Age-related osteoporosis without current pathological fracture She was recently evaluated by Sports Med She is intolerant to oral and IV bisphosphonate. Recent labs stable, CMP, Vit D Level She is awaiting insurance approval for Prolia or bio equivalent.  07/01/2022 EXAM: DUAL X-RAY ABSORPTIOMETRY (DXA) FOR BONE MINERAL DENSITY ASSESSMENT: The BMD measured at Femur Neck Right is 0.655 g/cm2 with a T-score of -2.8. This patient is considered osteoporotic according to World Health Organization [facility]) criteria.  4. Vit D Def  Latest Reference Range & Units 12/23/23 11:45  VITD 30.00 - 100.00 ng/mL 52.82   Vit D Level stable and at goal She is on weekly Ergocalciferol - denies N/V/Muscle Weakness  Assessment/Plan:   1. Glucose intolerance (Primary) Continue Cat 3 MP and daily  activity Reduce Ozempic  to 1mg  this Friday, 01/01/24  2. COPD mixed type (HCC) Continue Cat 3 MP and daily activity Use supplemental O2 as needed  3. Age-related osteoporosis without current pathological fracture Continue Cat 3 MP and daily activity, especially weight bearing.  12/23/2023 Sport Med OV Note: Assessment and Plan: 73 y.o. female with osteoporosis.  Unfortunately patient is intolerant to bisphosphonates causing muscle pains aches and fatigue.   She was intolerant to oral bisphosphonates.   I think is likely she is getting a very similar set of side effects with IV bisphosphonates.  After discussion plan to use Prolia or bio equivalent.   Will check labs in preparation for Prolia and we will go ahead and order Prolia or bio equivalent.  Continue weekly Ergocalciferol  per [facility]  4. Vit D Def Refill   Vitamin D , Ergocalciferol , (DRISDOL ) 1.25 MG (50000 UNIT) CAPS capsule Take 1 capsule (50,000 Units total) by mouth every 7 (seven) days. Dispense: 4 capsule, Refills: 0 ordered   5. Obesity (BMI 30-39.9),CURRENT BMI 38.7  Laurie Dominguez is currently in the action stage of change. As such, her goal is to continue with weight loss efforts. She has agreed to the Category 3 Plan.   Exercise goals: Older adults should follow the adult guidelines. When older adults cannot meet the adult guidelines, they should be as physically active as their abilities and conditions will allow.  Older adults should do exercises that maintain or improve balance if they are at risk of falling.  Older adults should determine their level of effort for physical activity relative to their level of fitness.  Older adults with chronic conditions should understand whether and how their conditions affect their ability to do regular physical activity safely. Continue to increase daily activity.  Behavioral modification strategies: increasing lean protein intake, decreasing simple carbohydrates, increasing vegetables, increasing water intake, no skipping meals, meal planning and cooking strategies, keeping healthy foods in the home, ways to avoid boredom eating, and planning for success.  Laurie Dominguez has agreed to follow-up with our clinic in 4 weeks. She was informed of the importance of frequent follow-up visits to maximize her success with intensive lifestyle modifications for her multiple health conditions.   Objective:   Blood pressure 128/77, pulse 73, temperature 98.6 F  (37 C), height 5' (1.524 m), weight 198 lb (89.8 kg), SpO2 95%. Body mass index is 38.67 kg/m.  General: Cooperative, alert, well developed, in no acute distress. HEENT: Conjunctivae and lids unremarkable. Cardiovascular: Regular rhythm.  Lungs: Normal work of breathing. Neurologic: No focal deficits.   Lab Results  Component Value Date   CREATININE 0.67 12/23/2023   BUN 15 12/23/2023   NA 144 12/23/2023   K 3.4 (L) 12/23/2023   CL 103 12/23/2023   CO2 28 12/23/2023   Lab Results  Component Value Date   ALT 10 12/23/2023   AST 15 12/23/2023   ALKPHOS 76 12/23/2023   BILITOT 0.4 12/23/2023   Lab Results  Component Value Date   HGBA1C 5.2 09/15/2023   HGBA1C 5.4 04/22/2023   HGBA1C 5.3 11/14/2022   HGBA1C 5.4 07/09/2022   HGBA1C 5.4 01/22/2022   Lab Results  Component Value Date   INSULIN  8.2 09/15/2023   INSULIN  8.2 04/22/2023   INSULIN  9.2 07/09/2022   INSULIN  11.5 01/22/2022   INSULIN  12.0 08/21/2021   Lab Results  Component Value Date   TSH 3.710 09/15/2023   Lab Results  Component Value Date   CHOL 134 09/15/2023   HDL 45  09/15/2023   LDLCALC 74 09/15/2023   LDLDIRECT 150.5 06/20/2008   TRIG 74 09/15/2023   CHOLHDL 4 11/14/2022   Lab Results  Component Value Date   VD25OH 52.82 12/23/2023   VD25OH 49.5 09/15/2023   VD25OH 46.2 04/22/2023   Lab Results  Component Value Date   WBC 6.3 09/15/2023   HGB 12.8 09/15/2023   HCT 40.2 09/15/2023   MCV 95 09/15/2023   PLT 262 09/15/2023   No results found for: IRON, TIBC, FERRITIN  Attestation Statements:   Reviewed by clinician on day of visit: allergies, medications, problem list, medical history, surgical history, family history, social history, and previous encounter notes.  I have reviewed the above documentation for accuracy and completeness, and I agree with the above. -  Delia Sitar d. Verneta Hamidi, NP-C

## 2024-01-04 ENCOUNTER — Telehealth (HOSPITAL_COMMUNITY): Payer: Self-pay

## 2024-01-04 ENCOUNTER — Other Ambulatory Visit (HOSPITAL_COMMUNITY): Payer: Self-pay | Admitting: Family Medicine

## 2024-01-04 NOTE — Telephone Encounter (Signed)
 Auth Submission: APPROVED Site of care: Site of care: [facility] Payer: [facility] Medicare Medication & CPT/J Code(s) submitted: Jubbonti (denosumab-bbdz) [phone] Diagnosis Code: M81.0 Route of submission (phone, fax, portal): portal Phone # Fax # Auth type: Buy/Bill HB Units/visits requested: 60mg  q18months x 2 doses Reference number: [id number] Approval from: 01/04/24 to 01/03/25

## 2024-01-06 NOTE — Telephone Encounter (Signed)
 Jama Schuyler RAMAN, CPhT    01/04/24 12:02 PM Note Auth Submission: APPROVED Site of care: Site of care: [facility] Payer: [facility] Medicare Medication & CPT/J Code(s) submitted: Jubbonti (denosumab-bbdz) [phone] Diagnosis Code: M81.0 Route of submission (phone, fax, portal): portal Phone # Fax # Auth type: Buy/Bill HB Units/visits requested: 60mg  q3months x 2 doses Reference number: [id number] Approval from: 01/04/24 to 01/03/25

## 2024-01-07 ENCOUNTER — Telehealth: Payer: Self-pay | Admitting: Adult Health

## 2024-01-07 NOTE — Telephone Encounter (Signed)
 Copied from CRM [id number]. Topic: General - Call Back - No Documentation >> Jan 07, 2024  8:14 AM Olam RAMAN wrote: Reason for CRM: antwanet calling from  [facility] [employer] CB [phone] regarding a letter for abnormal lab results

## 2024-01-12 ENCOUNTER — Other Ambulatory Visit: Payer: Self-pay | Admitting: Adult Health

## 2024-01-12 ENCOUNTER — Ambulatory Visit (HOSPITAL_COMMUNITY)
Admission: RE | Admit: 2024-01-12 | Discharge: 2024-01-12 | Disposition: A | Discharge status: Home / Self Care | Source: Ambulatory Visit | Attending: Family Medicine | Admitting: Family Medicine

## 2024-01-12 VITALS — BP 133/84 | HR 69 | Temp 97.3°F | Resp 16

## 2024-01-12 DIAGNOSIS — M810 Age-related osteoporosis without current pathological fracture: Secondary | ICD-10-CM | POA: Insufficient documentation

## 2024-01-12 MED ORDER — DENOSUMAB-BBDZ 60 MG/ML ~~LOC~~ SOSY
60.0000 mg | PREFILLED_SYRINGE | Freq: Once | SUBCUTANEOUS | Status: AC
  Administered 2024-01-12: 60 mg via SUBCUTANEOUS
  Filled 2024-01-12: qty 1

## 2024-01-13 NOTE — Progress Notes (Signed)
 Laurie Dominguez                                          MRN: [id number]   01/13/2024   The [facility] Quality Team Specialist reviewed this patient medical record for the purposes of chart review for care gap closure. The following were reviewed: abstraction for care gap closure-glycemic status assessment. Chart reviewed for EED as well.    [facility]BCI Quality Team

## 2024-01-13 NOTE — Telephone Encounter (Addendum)
 Last Jubbonti inj 01/12/24 Next Jubbonti inj due 07/13/24

## 2024-01-27 ENCOUNTER — Ambulatory Visit (INDEPENDENT_AMBULATORY_CARE_PROVIDER_SITE_OTHER): Payer: Self-pay | Admitting: Adult Health

## 2024-02-01 ENCOUNTER — Ambulatory Visit (INDEPENDENT_AMBULATORY_CARE_PROVIDER_SITE_OTHER): Admitting: Adult Health

## 2024-02-01 VITALS — BP 128/68 | HR 65 | Temp 98.3°F | Ht 60.0 in | Wt 202.0 lb

## 2024-02-01 DIAGNOSIS — E7439 Other disorders of intestinal carbohydrate absorption: Secondary | ICD-10-CM

## 2024-02-01 DIAGNOSIS — E559 Vitamin D deficiency, unspecified: Secondary | ICD-10-CM

## 2024-02-01 DIAGNOSIS — M810 Age-related osteoporosis without current pathological fracture: Secondary | ICD-10-CM | POA: Diagnosis not present

## 2024-02-01 DIAGNOSIS — E669 Obesity, unspecified: Secondary | ICD-10-CM | POA: Diagnosis not present

## 2024-02-01 DIAGNOSIS — Z6839 Body mass index (BMI) 39.0-39.9, adult: Secondary | ICD-10-CM

## 2024-02-01 MED ORDER — VITAMIN D (ERGOCALCIFEROL) 1.25 MG (50000 UNIT) PO CAPS
50000.0000 [IU] | ORAL_CAPSULE | ORAL | 0 refills | Status: AC
Start: 2024-02-01 — End: ?

## 2024-02-01 NOTE — Progress Notes (Signed)
 WEIGHT SUMMARY AND BIOMETRICS  Vitals Temp: 98.3 F (36.8 C) BP: 128/68 Pulse Rate: 65 SpO2: 93 %   Anthropometric Measurements Height: 5' (1.524 m) Weight: 202 lb (91.6 kg) BMI (Calculated): 39.45 Weight at Last Visit: 198 lb Weight Lost Since Last Visit: 0 Weight Gained Since Last Visit: 4 lb Starting Weight: 269 lb Total Weight Loss (lbs): 67 lb (30.4 kg) Peak Weight: 306 lb   Body Composition  Body Fat %: 55.6 % Fat Mass (lbs): 112.8 lbs Muscle Mass (lbs): 85.4 lbs Visceral Fat Rating : 19   Other Clinical Data Fasting: no Labs: no Today's Visit #: 28 Starting Date: 08/21/21    Chief Complaint:   OBESITY Laurie Dominguez is here to discuss her progress with her obesity treatment plan.  She is on the the Category 3 Plan and states she is following her eating plan approximately 80 % of the time.  She states she is exercising Chair Exercises 15 minutes 2 times per week.  Interim History:  Reviewed Bioimpedance Results with pt: Muscle Mass:-0. lb Adipose Mass: +4.8 lbs  [name] provided the following food recall that is typical of a day: Late Breakfast: Raisin Bran with toast Snack: Cheese and Fruit Dinner: Meat protein and grilled vegetables  Of note- Her wonderful husband is at [facility]  Subjective:   1. Osteoporosis  12/23/2023 Sports Med OV Notes: Assessment and Plan: 73 y.o. female with osteoporosis.  Unfortunately patient is intolerant to bisphosphonates causing muscle pains aches and fatigue.  She was intolerant to oral bisphosphonates.  I think is likely she is getting a very similar set of side effects with IV bisphosphonates.  After discussion plan to use Prolia or bio equivalent.  Will check labs in preparation for Prolia and we will go ahead and order Prolia or bio equivalent.   01/12/2024   denosumab-bbdz (JUBBONTI) injection 60 mg        2. Vitamin D  deficiency  Latest Reference Range & Units 08/21/21 12:47 01/22/22 13:04 07/09/22 11:54  04/22/23 10:57 09/15/23 09:42 12/23/23 11:45  Vitamin D , 25-Hydroxy 30.0 - 100.0 ng/mL 10.5 (L) 53.8 53.9 46.2 49.5   VITD 30.00 - 100.00 ng/mL      52.82  (L): Data is abnormally low  Most recent Vit D level was therapeutic She is on weekly ergocalciferol - denies N/V/Muscel Weakness  3. Glucose intolerance PCP manages weekly max dose Ozempic  2mg  Denies mass in neck, dysphagia, dyspepsia, persistent hoarseness, abdominal pain, or N/V/C  She reports low appetite mid day and will often only have healthy snack in between brunch and dinner.  Assessment/Plan:   1. Osteoporosis  Increase weight bearing exercise Continue weekly Ergocalciferol  and twice yearly infusion  denosumab-bbdz (JUBBONTI) injection 60 mg         2. Vitamin D  deficiency Refill  Vitamin D , Ergocalciferol , (DRISDOL ) 1.25 MG (50000 UNIT) CAPS capsule Take 1 capsule (50,000 Units total) by mouth every 7 (seven) days. Dispense: 4 capsule, Refills: 0 ordered   3. Glucose intolerance Follow Cat 3 MP and continue regular exercise Continue weekly Ergocalciferol   4. Obesity (BMI 30-39.9),CURRENT BMI 39.6  Laurie Dominguez is currently in the action stage of change. As such, her goal is to continue with weight loss efforts. She has agreed to the Category 3 Plan.   Exercise goals: Older adults should follow the adult guidelines. When older adults cannot meet the adult guidelines, they should be as physically active as their abilities and conditions will allow.  Older adults should do exercises  that maintain or improve balance if they are at risk of falling.  Older adults should determine their level of effort for physical activity relative to their level of fitness.  Older adults with chronic conditions should understand whether and how their conditions affect their ability to do regular physical activity safely.  Behavioral modification strategies: increasing lean protein intake, decreasing simple carbohydrates, increasing  vegetables, increasing water intake, no skipping meals, meal planning and cooking strategies, keeping healthy foods in the home, better snacking choices, and planning for success.  [name] has agreed to follow-up with our clinic in 4 weeks. She was informed of the importance of frequent follow-up visits to maximize her success with intensive lifestyle modifications for her multiple health conditions.   Objective:   Blood pressure 128/68, pulse 65, temperature 98.3 F (36.8 C), height 5' (1.524 m), weight 202 lb (91.6 kg), SpO2 93%. Body mass index is 39.45 kg/m.  General: Cooperative, alert, well developed, in no acute distress. HEENT: Conjunctivae and lids unremarkable. Cardiovascular: Regular rhythm.  Lungs: Normal work of breathing. Neurologic: No focal deficits.   Lab Results  Component Value Date   CREATININE 0.67 12/23/2023   BUN 15 12/23/2023   NA 144 12/23/2023   K 3.4 (L) 12/23/2023   CL 103 12/23/2023   CO2 28 12/23/2023   Lab Results  Component Value Date   ALT 10 12/23/2023   AST 15 12/23/2023   ALKPHOS 76 12/23/2023   BILITOT 0.4 12/23/2023   Lab Results  Component Value Date   HGBA1C 5.2 09/15/2023   HGBA1C 5.4 04/22/2023   HGBA1C 5.3 11/14/2022   HGBA1C 5.4 07/09/2022   HGBA1C 5.4 01/22/2022   Lab Results  Component Value Date   INSULIN  8.2 09/15/2023   INSULIN  8.2 04/22/2023   INSULIN  9.2 07/09/2022   INSULIN  11.5 01/22/2022   INSULIN  12.0 08/21/2021   Lab Results  Component Value Date   TSH 3.710 09/15/2023   Lab Results  Component Value Date   CHOL 134 09/15/2023   HDL 45 09/15/2023   LDLCALC 74 09/15/2023   LDLDIRECT 150.5 06/20/2008   TRIG 74 09/15/2023   CHOLHDL 4 11/14/2022   Lab Results  Component Value Date   VD25OH 52.82 12/23/2023   VD25OH 49.5 09/15/2023   VD25OH 46.2 04/22/2023   Lab Results  Component Value Date   WBC 6.3 09/15/2023   HGB 12.8 09/15/2023   HCT 40.2 09/15/2023   MCV 95 09/15/2023   PLT 262  09/15/2023   No results found for: IRON, TIBC, FERRITIN  Attestation Statements:   Reviewed by clinician on day of visit: allergies, medications, problem list, medical history, surgical history, family history, social history, and previous encounter notes.  I have reviewed the above documentation for accuracy and completeness, and I agree with the above. -  Tianah Lonardo d. Citlali Gautney, NP-C

## 2024-02-04 ENCOUNTER — Other Ambulatory Visit (INDEPENDENT_AMBULATORY_CARE_PROVIDER_SITE_OTHER): Payer: Self-pay | Admitting: Adult Health

## 2024-02-10 NOTE — Progress Notes (Signed)
   02/10/2024  Patient ID: Laurie Dominguez, female   DOB: 16-Nov-1950, 73 y.o.   MRN: [id number]  Faxed in order for Ozempic  2mg  refill as requested from [facility], pending shipment.  Jon VEAR Lindau, PharmD Clinical Pharmacist [phone]

## 2024-02-16 ENCOUNTER — Other Ambulatory Visit: Payer: Self-pay | Admitting: Adult Health

## 2024-02-25 ENCOUNTER — Encounter: Payer: Self-pay | Admitting: Adult Health

## 2024-02-25 ENCOUNTER — Ambulatory Visit: Admitting: Adult Health

## 2024-02-25 VITALS — BP 138/70 | HR 68 | Temp 98.2°F | Ht 60.0 in | Wt 208.0 lb

## 2024-02-25 DIAGNOSIS — G8929 Other chronic pain: Secondary | ICD-10-CM

## 2024-02-25 MED ORDER — METHYLPREDNISOLONE ACETATE 80 MG/ML IJ SUSP
80.0000 mg | Freq: Once | INTRAMUSCULAR | Status: AC
  Administered 2024-02-25: 80 mg via INTRA_ARTICULAR

## 2024-02-25 NOTE — Progress Notes (Signed)
 Subjective:    Patient ID: Laurie Dominguez, female    DOB: 1950-12-27, 73 y.o.   MRN: [id number]  HPI  73 year old female who  has a past medical history of Colon polyp (08/16/2004), COPD (chronic obstructive pulmonary disease) (HCC), Depression, GERD (gastroesophageal reflux disease), High cholesterol, Hypertension, Joint pain, Nephrolithiasis, Obesity, SOB (shortness of breath), and Swelling of lower extremity.  She presents to the office today for chronic bilateral knee pain. She has known tricompartmental osteoarthritis and uses Tramadol  50 mg and Nsaids PRN. She uses a motorized wheelchair to get around. She does get periodic knee injections with her last being three months ago in September.   Osteoarthritis of bilateral knees-uses tramadol  50 mg as needed and naproxen  as needed.  She does not ambulate much due to chronic pain.  Uses a motorized wheelchair to get around. She does get periodic steroid injections, with her last being in March 2025. She reports that over the last few weeks both of her knees started hurting again   Review of Systems See HPI   Past Medical History:  Diagnosis Date   Colon polyp 08/16/2004   Hyperplastic   COPD (chronic obstructive pulmonary disease) (HCC)    Depression    GERD (gastroesophageal reflux disease)    High cholesterol    Hypertension    Joint pain    Nephrolithiasis    Obesity    SOB (shortness of breath)    Swelling of lower extremity     Social History   Socioeconomic History   Marital status: Married    Spouse name: FW   Number of children: Not on file   Years of education: Not on file   Highest education level: Bachelor's degree (e.g., BA, AB, BS)  Occupational History   Occupation: Retired  Tobacco Use   Smoking status: Former    Current packs/day: 0.00    Average packs/day: 2.0 packs/day for 40.0 years (80.0 ttl pk-yrs)    Types: Cigarettes    Start date: 03/24/1966    Quit date: 03/24/2006    Years since quitting:  17.9   Smokeless tobacco: Never  Vaping Use   Vaping status: Never Used  Substance and Sexual Activity   Alcohol use: No    Alcohol/week: 0.0 standard drinks of alcohol   Drug use: No   Sexual activity: Not on file  Other Topics Concern   Not on file  Social History Narrative   Not on file   Social Drivers of Health   Financial Resource Strain: Low Risk  (02/12/2023)   Overall Financial Resource Strain (CARDIA)    Difficulty of Paying Living Expenses: Not very hard  Food Insecurity: No Food Insecurity (02/12/2023)   Hunger Vital Sign    Worried About Running Out of Food in the Last Year: Never true    Ran Out of Food in the Last Year: Never true  Transportation Needs: No Transportation Needs (02/12/2023)   PRAPARE - Administrator, Civil Service (Medical): No    Lack of Transportation (Non-Medical): No  Physical Activity: Inactive (02/12/2023)   Exercise Vital Sign    Days of Exercise per Week: 0 days    Minutes of Exercise per Session: 0 min  Stress: No Stress Concern Present (02/12/2023)   [employer] of Occupational Health - Occupational Stress Questionnaire    Feeling of Stress : Only a little  Social Connections: Moderately Integrated (02/12/2023)   Social Connection and Isolation Panel    Frequency  of Communication with Friends and Family: Twice a week    Frequency of Social Gatherings with Friends and Family: Twice a week    Attends Religious Services: More than 4 times per year    Active Member of [employer] or Organizations: No    Attends Banker Meetings: Never    Marital Status: Married  Catering Manager Violence: Unknown (02/12/2023)   Humiliation, Afraid, Rape, and Kick questionnaire    Fear of Current or Ex-Partner: No    Emotionally Abused: No    Physically Abused: No    Sexually Abused: Not on file    Past Surgical History:  Procedure Laterality Date   BREAST BIOPSY Left 12/2017   CHOLECYSTECTOMY     COLONOSCOPY  2006    DILATION AND CURETTAGE OF UTERUS     MOHS SURGERY     TONSILLECTOMY      Family History  Problem Relation Age of Onset   Cancer Mother        unknown primary   Liver cancer Mother        unknown primary   Other Father        spinal cord tumor   Cancer Father    Alcoholism Father    Breast cancer Neg Hx     Allergies  Allergen Reactions   Codeine Phosphate    Fosamax  [Alendronate ] Other (See Comments)    Muscle aches   Simvastatin      Myalgia     Current Outpatient Medications on File Prior to Visit  Medication Sig Dispense Refill   albuterol  (VENTOLIN  HFA) 108 (90 Base) MCG/ACT inhaler USE 2 INHALATIONS BY MOUTH  EVERY 6 HOURS AS NEEDED FOR WHEEZING OR SHORTNESS OF  BREATH 34 g 3   amLODipine  (NORVASC ) 5 MG tablet TAKE 1 TABLET BY MOUTH DAILY 100 tablet 2   budesonide -formoterol  (SYMBICORT ) 160-4.5 MCG/ACT inhaler Inhale 2 puffs into the lungs daily. 30.6 g 0   cyanocobalamin  (VITAMIN B12) 500 MCG tablet Take 1 tablet (500 mcg total) by mouth daily. 90 tablet 0   hydrocortisone  2.5 % ointment SMARTSIG:Topical     losartan -hydrochlorothiazide (HYZAAR) 100-25 MG tablet TAKE 1 TABLET BY MOUTH DAILY 100 tablet 3   omeprazole  (PRILOSEC) 20 MG capsule TAKE 1 CAPSULE BY MOUTH DAILY 100 capsule 1   PARoxetine  (PAXIL ) 30 MG tablet Take 2 tablets (60 mg total) by mouth daily. 180 tablet 3   rosuvastatin  (CRESTOR ) 5 MG tablet TAKE 1 TABLET BY MOUTH EVERY  OTHER DAY 50 tablet 2   Semaglutide , 2 MG/DOSE, (OZEMPIC , 2 MG/DOSE,) 8 MG/3ML SOPN Inject 2 mg into the skin once a week.     triamcinolone  (KENALOG ) 0.025 % cream Apply 1 application topically 2 (two) times daily.     triamcinolone  (NASACORT ) 55 MCG/ACT AERO nasal inhaler Place 2 sprays into the nose daily. 32.4 mL 3   Vitamin D , Ergocalciferol , (DRISDOL ) 1.25 MG (50000 UNIT) CAPS capsule Take 1 capsule (50,000 Units total) by mouth every 7 (seven) days. 4 capsule 0   No current facility-administered medications on file prior to  visit.    BP 138/70   Pulse 68   Temp 98.2 F (36.8 C) (Oral)   Ht 5' (1.524 m)   Wt 208 lb (94.3 kg)   SpO2 93%   BMI 40.62 kg/m       Objective:   Physical Exam Vitals and nursing note reviewed.  Constitutional:      Appearance: Normal appearance. She is obese.  Cardiovascular:  Rate and Rhythm: Normal rate and regular rhythm.     Pulses: Normal pulses.     Heart sounds: Normal heart sounds.  Pulmonary:     Effort: Pulmonary effort is normal.     Breath sounds: Normal breath sounds.  Skin:    General: Skin is warm and dry.  Neurological:     General: No focal deficit present.     Mental Status: She is alert and oriented to person, place, and time.  Psychiatric:        Mood and Affect: Mood normal.        Behavior: Behavior normal.        Thought Content: Thought content normal.        Judgment: Judgment normal.       Assessment & Plan:  1. Chronic pain of right knee (Primary) Discussed risks and benefits of corticosteroid injection and patient consented.  After prepping skin with betadine, injected 80 mg depomedrol and 2 cc of plain xylocaine with 22 gauge one and one half inch needle using anterolateral approach and pt tolerated well.  - methylPREDNISolone  acetate (DEPO-MEDROL ) injection 80 mg  2. Chronic pain of left knee Discussed risks and benefits of corticosteroid injection and patient consented.  After prepping skin with betadine, injected 80 mg depomedrol and 2 cc of plain xylocaine with 22 gauge one and one half inch needle using anterolateral approach and pt tolerated well.  - methylPREDNISolone  acetate (DEPO-MEDROL ) injection 80 mg  Kasai Beltran, NP

## 2024-03-03 ENCOUNTER — Encounter (INDEPENDENT_AMBULATORY_CARE_PROVIDER_SITE_OTHER): Payer: Self-pay | Admitting: Adult Health

## 2024-03-03 ENCOUNTER — Ambulatory Visit (INDEPENDENT_AMBULATORY_CARE_PROVIDER_SITE_OTHER): Admitting: Adult Health

## 2024-03-03 VITALS — BP 133/76 | HR 75 | Temp 97.7°F | Ht 60.0 in | Wt 201.0 lb

## 2024-03-03 DIAGNOSIS — E7439 Other disorders of intestinal carbohydrate absorption: Secondary | ICD-10-CM

## 2024-03-03 DIAGNOSIS — E538 Deficiency of other specified B group vitamins: Secondary | ICD-10-CM

## 2024-03-03 DIAGNOSIS — E559 Vitamin D deficiency, unspecified: Secondary | ICD-10-CM

## 2024-03-03 DIAGNOSIS — Z87891 Personal history of nicotine dependence: Secondary | ICD-10-CM

## 2024-03-03 DIAGNOSIS — J449 Chronic obstructive pulmonary disease, unspecified: Secondary | ICD-10-CM

## 2024-03-03 DIAGNOSIS — E669 Obesity, unspecified: Secondary | ICD-10-CM | POA: Diagnosis not present

## 2024-03-03 DIAGNOSIS — Z0000 Encounter for general adult medical examination without abnormal findings: Secondary | ICD-10-CM

## 2024-03-03 DIAGNOSIS — Z6839 Body mass index (BMI) 39.0-39.9, adult: Secondary | ICD-10-CM | POA: Diagnosis not present

## 2024-03-03 MED ORDER — CYANOCOBALAMIN 500 MCG PO TABS: 500.0000 ug | ORAL_TABLET | Freq: Every day | ORAL | 0 refills | Status: AC

## 2024-03-03 MED ORDER — VITAMIN D (ERGOCALCIFEROL) 1.25 MG (50000 UNIT) PO CAPS: 50000.0000 [IU] | ORAL_CAPSULE | ORAL | 0 refills | Status: DC

## 2024-03-03 NOTE — Progress Notes (Signed)
 WEIGHT SUMMARY AND BIOMETRICS  Vitals Temp: 97.7 F (36.5 C) BP: 133/76 Pulse Rate: 75 SpO2: 93 %   Anthropometric Measurements Height: 5' (1.524 m) Weight: 201 lb (91.2 kg) BMI (Calculated): 39.26 Weight at Last Visit: 202 lb Weight Lost Since Last Visit: 1 lb Weight Gained Since Last Visit: 0 Starting Weight: 269 lb Total Weight Loss (lbs): 68 lb (30.8 kg) Peak Weight: 306 lb   Body Composition  Body Fat %: 55.6 % Fat Mass (lbs): 111.8 lbs Muscle Mass (lbs): 84.8 lbs Visceral Fat Rating : 19   Other Clinical Data Fasting: no Labs: no Today's Visit #: 29 Starting Date: 08/21/21    Chief Complaint:   OBESITY Laurie Dominguez is here to discuss her progress with her obesity treatment plan.  She is on the the Category 3 Plan and states she is following her eating plan approximately 75 % of the time.  She states she is exercising: None, recent bil knee injections  Interim History:  She is frustrated with slow movement on scale the last 12 months. Reviewed myriad of health improvements: 1) She only requires supplemental O2 about 10% of total time per week. 2) Her BMI < 40- increases eligibility for certain surgical procedures 3) HDL cholesterol improved 4) Vit D Level is at goal 5) A1c and Insulins both improved  Her wonderful, supportive husband is at [facility] during OV  Subjective:   1. COPD mixed type (HCC) She reports only requiring supplemental O2 10% of the total time per week. She is not wearing 02 at OV today  2. Glucose intolerance PCP manages weekly Ozempic  2mg  Denies mass in neck, dysphagia, dyspepsia, persistent hoarseness, abdominal pain, or N/V/C   3. Vitamin D  deficiency She is on weekly Ergocalciferol - denies N/V/Muscle Weakness  4. B12 deficiency She is on daily oral cyanocobalmin 500mcg  5. Healthcare maintenance She received bilateral knee steroid injections from PCP on 02/25/2024  Assessment/Plan:   1. COPD mixed type (HCC)  (Primary) Continue healthy eating and monitoring respiratory status Supplemental 02 PRN  2. Glucose intolerance Continue healthy eating and increase activity as tolerated. Ozempic  per PCP  3. Vitamin D  deficiency Refill Vitamin D , Ergocalciferol , (DRISDOL ) 1.25 MG (50000 UNIT) CAPS capsule Take 1 capsule (50,000 Units total) by mouth every 7 (seven) days. Dispense: 4 capsule, Refills: 0 ordered   4. B12 deficiency Refill  cyanocobalamin  (VITAMIN B12) 500 MCG tablet Take 1 tablet (500 mcg total) by mouth daily. Dispense: 90 tablet, Refills: 0 ordered   5. Healthcare maintenance Continue healthy eating and increase activity as tolerated.  6. BMI 39.0-39.9,adult, CURRENT BMI 39.3  Laurie Dominguez is currently in the action stage of change. As such, her goal is to continue with weight loss efforts. She has agreed to the Category 3 Plan.   Exercise goals: Older adults should follow the adult guidelines. When older adults cannot meet the adult guidelines, they should be as physically active as their abilities and conditions will allow.  Older adults should do exercises that maintain or improve balance if they are at risk of falling.  Older adults should determine their level of effort for physical activity relative to their level of fitness.  Older adults with chronic conditions should understand whether and how their conditions affect their ability to do regular physical activity safely. Activity as tolerated  Behavioral modification strategies: increasing lean protein intake, decreasing simple carbohydrates, increasing vegetables, increasing water intake, decreasing liquid calories, no skipping meals, meal planning and cooking strategies, keeping healthy foods  in the home, ways to avoid boredom eating, and planning for success.  Corine has agreed to follow-up with our clinic in 4 weeks. She was informed of the importance of frequent follow-up visits to maximize her success with intensive  lifestyle modifications for her multiple health conditions.   Objective:   Blood pressure 133/76, pulse 75, temperature 97.7 F (36.5 C), height 5' (1.524 m), weight 201 lb (91.2 kg), SpO2 93%. Body mass index is 39.26 kg/m.  General: Cooperative, alert, well developed, in no acute distress. HEENT: Conjunctivae and lids unremarkable. Cardiovascular: Regular rhythm.  Lungs: Normal work of breathing. Neurologic: No focal deficits.   Lab Results  Component Value Date   CREATININE 0.67 12/23/2023   BUN 15 12/23/2023   NA 144 12/23/2023   K 3.4 (L) 12/23/2023   CL 103 12/23/2023   CO2 28 12/23/2023   Lab Results  Component Value Date   ALT 10 12/23/2023   AST 15 12/23/2023   ALKPHOS 76 12/23/2023   BILITOT 0.4 12/23/2023   Lab Results  Component Value Date   HGBA1C 5.2 09/15/2023   HGBA1C 5.4 04/22/2023   HGBA1C 5.3 11/14/2022   HGBA1C 5.4 07/09/2022   HGBA1C 5.4 01/22/2022   Lab Results  Component Value Date   INSULIN  8.2 09/15/2023   INSULIN  8.2 04/22/2023   INSULIN  9.2 07/09/2022   INSULIN  11.5 01/22/2022   INSULIN  12.0 08/21/2021   Lab Results  Component Value Date   TSH 3.710 09/15/2023   Lab Results  Component Value Date   CHOL 134 09/15/2023   HDL 45 09/15/2023   LDLCALC 74 09/15/2023   LDLDIRECT 150.5 06/20/2008   TRIG 74 09/15/2023   CHOLHDL 4 11/14/2022   Lab Results  Component Value Date   VD25OH 52.82 12/23/2023   VD25OH 49.5 09/15/2023   VD25OH 46.2 04/22/2023   Lab Results  Component Value Date   WBC 6.3 09/15/2023   HGB 12.8 09/15/2023   HCT 40.2 09/15/2023   MCV 95 09/15/2023   PLT 262 09/15/2023   No results found for: IRON, TIBC, FERRITIN  Attestation Statements:   Reviewed by clinician on day of visit: allergies, medications, problem list, medical history, surgical history, family history, social history, and previous encounter notes.  I have reviewed the above documentation for accuracy and completeness, and I agree  with the above. -  Antonina Deziel d. Lindy Garczynski, NP-C

## 2024-04-03 ENCOUNTER — Other Ambulatory Visit: Payer: Self-pay | Admitting: Adult Health

## 2024-04-04 ENCOUNTER — Ambulatory Visit (INDEPENDENT_AMBULATORY_CARE_PROVIDER_SITE_OTHER): Admitting: Adult Health

## 2024-04-04 ENCOUNTER — Encounter (INDEPENDENT_AMBULATORY_CARE_PROVIDER_SITE_OTHER): Payer: Self-pay | Admitting: Adult Health

## 2024-04-04 VITALS — BP 132/82 | HR 86 | Temp 97.7°F | Ht 60.0 in | Wt 202.0 lb

## 2024-04-04 DIAGNOSIS — E669 Obesity, unspecified: Secondary | ICD-10-CM

## 2024-04-04 DIAGNOSIS — E559 Vitamin D deficiency, unspecified: Secondary | ICD-10-CM | POA: Diagnosis not present

## 2024-04-04 DIAGNOSIS — Z6839 Body mass index (BMI) 39.0-39.9, adult: Secondary | ICD-10-CM | POA: Diagnosis not present

## 2024-04-04 DIAGNOSIS — M810 Age-related osteoporosis without current pathological fracture: Secondary | ICD-10-CM | POA: Diagnosis not present

## 2024-04-04 DIAGNOSIS — E7439 Other disorders of intestinal carbohydrate absorption: Secondary | ICD-10-CM | POA: Diagnosis not present

## 2024-04-04 MED ORDER — VITAMIN D (ERGOCALCIFEROL) 1.25 MG (50000 UNIT) PO CAPS: 50000.0000 [IU] | ORAL_CAPSULE | ORAL | 0 refills | Status: AC

## 2024-04-04 NOTE — Progress Notes (Signed)
 "    WEIGHT SUMMARY AND BIOMETRICS  Vitals Temp: 97.7 F (36.5 C) BP: 132/82 Pulse Rate: 86 SpO2: 97 %   Anthropometric Measurements Height: 5' (1.524 m) Weight: 202 lb (91.6 kg) BMI (Calculated): 39.45 Weight at Last Visit: 201lb Weight Lost Since Last Visit: 0lb Weight Gained Since Last Visit: 1lb Starting Weight: 269lb Total Weight Loss (lbs): 67 lb (30.4 kg) Peak Weight: 306lb   Body Composition  Body Fat %: 55.2 % Fat Mass (lbs): 111.8 lbs Muscle Mass (lbs): 86 lbs Visceral Fat Rating : 19   Other Clinical Data Fasting: No Labs: No Today's Visit #: 30 Starting Date: 08/21/21   Chief Complaint:   OBESITY Laurie Dominguez is here to discuss her progress with her obesity treatment plan.  She is on the the Category 4 Plan and states she is following her eating plan approximately 70 % of the time.  She states she is exercising:NEAT Activities  Interim History:  Osteoporosis managed by [facility] She was unable to tolerate intolerant to bisphosphonates causing muscle pains aches and fatigue.   She was started on Prolia  infusion- first tx Oct 2025- then 2-3 weeks s/p infusion she developed diffuse joint pain and muscle aches- continued acute sx's at present. She is scheduled for Prolia  Infusion April 2026- she would like to cease infusions and explore other tx options.  Reviewed Bioimpedance Results with pt: Muscle Mass: +1.2 lbs Adipose Mass: No Change  Subjective:   1. Glucose intolerance She recently received information from her insurance company that in 2026 she will not receive financial assistance GLP-1 therapy. She has a large supply of Ozempic  2mg  at home.  2. Vitamin D  deficiency She is on weekly Ergocalciferol - denies N/V  3. Age-related osteoporosis without current pathological fracture 12/23/2023 Sports [facility] OV Notes Assessment and Plan: 74 y.o. female with osteoporosis.  Unfortunately patient is intolerant to bisphosphonates causing muscle pains  aches and fatigue.  She was intolerant to oral bisphosphonates.  I think is likely she is getting a very similar set of side effects with IV bisphosphonates.  After discussion plan to use Prolia  or bio equivalent.  Will check labs in preparation for Prolia  and we will go ahead and order Prolia  or bio equivalent.   Assessment/Plan:   1. Glucose intolerance (Primary) Continue healthy eating and remain active  2. Vitamin D  deficiency Refill - Vitamin D , Ergocalciferol , (DRISDOL ) 1.25 MG (50000 UNIT) CAPS capsule; Take 1 capsule (50,000 Units total) by mouth every 7 (seven) days.  Dispense: 4 capsule; Refill: 0 Monitor Labs  3. Age-related osteoporosis without current pathological fracture F/u with Sports [facility]/PCP in regards to treatment options Increase weight bearing exercise as tolerated  4. BMI 39.0-39.9,adult, CURRENT BMI 39.5  Laurie Dominguez is currently in the action stage of change. As such, her goal is to continue with weight loss efforts. She has agreed to the Category 4 Plan.   Exercise goals: Older adults should follow the adult guidelines. When older adults cannot meet the adult guidelines, they should be as physically active as their abilities and conditions will allow.  Older adults should do exercises that maintain or improve balance if they are at risk of falling.  Older adults should determine their level of effort for physical activity relative to their level of fitness.  Older adults with chronic conditions should understand whether and how their conditions affect their ability to do regular physical activity safely.  Behavioral modification strategies: increasing lean protein intake, decreasing simple carbohydrates, increasing vegetables, increasing water intake,  no skipping meals, meal planning and cooking strategies, keeping healthy foods in the home, ways to avoid boredom eating, and planning for success.  Laurie Dominguez has agreed to follow-up with our clinic in 4 weeks. She was  informed of the importance of frequent follow-up visits to maximize her success with intensive lifestyle modifications for her multiple health conditions.   Objective:   Blood pressure 132/82, pulse 86, temperature 97.7 F (36.5 C), height 5' (1.524 m), weight 202 lb (91.6 kg), SpO2 97%. Body mass index is 39.45 kg/m.  General: Cooperative, alert, well developed, in no acute distress. HEENT: Conjunctivae and lids unremarkable. Cardiovascular: Regular rhythm.  Lungs: Normal work of breathing. Neurologic: No focal deficits.   Lab Results  Component Value Date   CREATININE 0.67 12/23/2023   BUN 15 12/23/2023   NA 144 12/23/2023   K 3.4 (L) 12/23/2023   CL 103 12/23/2023   CO2 28 12/23/2023   Lab Results  Component Value Date   ALT 10 12/23/2023   AST 15 12/23/2023   ALKPHOS 76 12/23/2023   BILITOT 0.4 12/23/2023   Lab Results  Component Value Date   HGBA1C 5.2 09/15/2023   HGBA1C 5.4 04/22/2023   HGBA1C 5.3 11/14/2022   HGBA1C 5.4 07/09/2022   HGBA1C 5.4 01/22/2022   Lab Results  Component Value Date   INSULIN  8.2 09/15/2023   INSULIN  8.2 04/22/2023   INSULIN  9.2 07/09/2022   INSULIN  11.5 01/22/2022   INSULIN  12.0 08/21/2021   Lab Results  Component Value Date   TSH 3.710 09/15/2023   Lab Results  Component Value Date   CHOL 134 09/15/2023   HDL 45 09/15/2023   LDLCALC 74 09/15/2023   LDLDIRECT 150.5 06/20/2008   TRIG 74 09/15/2023   CHOLHDL 4 11/14/2022   Lab Results  Component Value Date   VD25OH 52.82 12/23/2023   VD25OH 49.5 09/15/2023   VD25OH 46.2 04/22/2023   Lab Results  Component Value Date   WBC 6.3 09/15/2023   HGB 12.8 09/15/2023   HCT 40.2 09/15/2023   MCV 95 09/15/2023   PLT 262 09/15/2023   No results found for: IRON, TIBC, FERRITIN  Attestation Statements:   Reviewed by clinician on day of visit: allergies, medications, problem list, medical history, surgical history, family history, social history, and previous  encounter notes.  I have reviewed the above documentation for accuracy and completeness, and I agree with the above. -  Aaria Happ d. Devinn Hurwitz, NP-C "

## 2024-05-02 ENCOUNTER — Ambulatory Visit (INDEPENDENT_AMBULATORY_CARE_PROVIDER_SITE_OTHER): Admitting: Adult Health

## 2024-05-10 ENCOUNTER — Ambulatory Visit (INDEPENDENT_AMBULATORY_CARE_PROVIDER_SITE_OTHER): Admitting: Family Medicine

## 2024-07-13 ENCOUNTER — Encounter (HOSPITAL_COMMUNITY)

## 2024-07-19 ENCOUNTER — Encounter (HOSPITAL_COMMUNITY)
